# Patient Record
Sex: Male | Born: 1960 | Race: White | Hispanic: No | Marital: Single | Smoking: Current every day smoker
Health system: Southern US, Community
[De-identification: ages and names within clinical notes are randomized; demographics above are authoritative.]

## PROBLEM LIST (undated history)

## (undated) DIAGNOSIS — R079 Chest pain, unspecified: Secondary | ICD-10-CM

## (undated) DIAGNOSIS — J449 Chronic obstructive pulmonary disease, unspecified: Secondary | ICD-10-CM

## (undated) DIAGNOSIS — K759 Inflammatory liver disease, unspecified: Secondary | ICD-10-CM

## (undated) DIAGNOSIS — E440 Moderate protein-calorie malnutrition: Secondary | ICD-10-CM

## (undated) DIAGNOSIS — J439 Emphysema, unspecified: Secondary | ICD-10-CM

## (undated) DIAGNOSIS — Z23 Encounter for immunization: Secondary | ICD-10-CM

## (undated) DIAGNOSIS — J869 Pyothorax without fistula: Secondary | ICD-10-CM

## (undated) DIAGNOSIS — F1020 Alcohol dependence, uncomplicated: Secondary | ICD-10-CM

## (undated) DIAGNOSIS — J189 Pneumonia, unspecified organism: Secondary | ICD-10-CM

## (undated) DIAGNOSIS — R918 Other nonspecific abnormal finding of lung field: Secondary | ICD-10-CM

## (undated) DIAGNOSIS — I10 Essential (primary) hypertension: Secondary | ICD-10-CM

## (undated) DIAGNOSIS — Z720 Tobacco use: Secondary | ICD-10-CM

## (undated) DIAGNOSIS — N3941 Urge incontinence: Secondary | ICD-10-CM

## (undated) DIAGNOSIS — F1010 Alcohol abuse, uncomplicated: Secondary | ICD-10-CM

## (undated) HISTORY — DX: Alcohol abuse, uncomplicated: F10.10

## (undated) HISTORY — DX: Pyothorax without fistula: J86.9

## (undated) HISTORY — DX: Other nonspecific abnormal finding of lung field: R91.8

## (undated) HISTORY — PX: OTHER SURGICAL HISTORY: SHX169

## (undated) HISTORY — DX: Inflammatory liver disease, unspecified: K75.9

## (undated) HISTORY — DX: Essential (primary) hypertension: I10

## (undated) HISTORY — DX: Emphysema, unspecified: J43.9

## (undated) HISTORY — DX: Urge incontinence: N39.41

## (undated) HISTORY — DX: Pneumonia, unspecified organism: J18.9

## (undated) HISTORY — DX: Chest pain, unspecified: R07.9

## (undated) HISTORY — DX: Moderate protein-calorie malnutrition: E44.0

## (undated) HISTORY — DX: Encounter for immunization: Z23

---

## 1997-09-04 ENCOUNTER — Encounter: Payer: Self-pay | Admitting: Emergency Medicine

## 1997-09-04 ENCOUNTER — Emergency Department (HOSPITAL_COMMUNITY): Admission: EM | Admit: 1997-09-04 | Discharge: 1997-09-04 | Payer: Self-pay | Admitting: Emergency Medicine

## 1997-09-17 ENCOUNTER — Emergency Department (HOSPITAL_COMMUNITY): Admission: EM | Admit: 1997-09-17 | Discharge: 1997-09-17 | Payer: Self-pay | Admitting: Emergency Medicine

## 2006-05-29 ENCOUNTER — Emergency Department (HOSPITAL_COMMUNITY): Admission: EM | Admit: 2006-05-29 | Discharge: 2006-05-29 | Payer: Self-pay | Admitting: Emergency Medicine

## 2010-08-24 ENCOUNTER — Emergency Department (HOSPITAL_COMMUNITY): Payer: Self-pay

## 2010-08-24 ENCOUNTER — Emergency Department (HOSPITAL_COMMUNITY)
Admission: EM | Admit: 2010-08-24 | Discharge: 2010-08-24 | Disposition: A | Discharge status: Home / Self Care | Payer: Self-pay | Attending: Emergency Medicine | Admitting: Emergency Medicine

## 2010-08-24 DIAGNOSIS — S4350XA Sprain of unspecified acromioclavicular joint, initial encounter: Secondary | ICD-10-CM | POA: Insufficient documentation

## 2010-08-24 DIAGNOSIS — IMO0002 Reserved for concepts with insufficient information to code with codable children: Secondary | ICD-10-CM | POA: Insufficient documentation

## 2010-08-24 DIAGNOSIS — S0990XA Unspecified injury of head, initial encounter: Secondary | ICD-10-CM | POA: Insufficient documentation

## 2010-08-24 DIAGNOSIS — S40019A Contusion of unspecified shoulder, initial encounter: Secondary | ICD-10-CM | POA: Insufficient documentation

## 2010-08-24 DIAGNOSIS — M25519 Pain in unspecified shoulder: Secondary | ICD-10-CM | POA: Insufficient documentation

## 2013-08-04 ENCOUNTER — Emergency Department (HOSPITAL_COMMUNITY): Payer: Self-pay

## 2013-08-04 ENCOUNTER — Encounter (HOSPITAL_COMMUNITY): Payer: Self-pay | Admitting: Emergency Medicine

## 2013-08-04 ENCOUNTER — Emergency Department (HOSPITAL_COMMUNITY)
Admission: EM | Admit: 2013-08-04 | Discharge: 2013-08-05 | Disposition: A | Discharge status: Home / Self Care | Payer: Self-pay | Attending: Emergency Medicine | Admitting: Emergency Medicine

## 2013-08-04 DIAGNOSIS — D696 Thrombocytopenia, unspecified: Secondary | ICD-10-CM | POA: Insufficient documentation

## 2013-08-04 DIAGNOSIS — S301XXA Contusion of abdominal wall, initial encounter: Secondary | ICD-10-CM | POA: Insufficient documentation

## 2013-08-04 DIAGNOSIS — R748 Abnormal levels of other serum enzymes: Secondary | ICD-10-CM | POA: Insufficient documentation

## 2013-08-04 DIAGNOSIS — Z791 Long term (current) use of non-steroidal anti-inflammatories (NSAID): Secondary | ICD-10-CM | POA: Insufficient documentation

## 2013-08-04 DIAGNOSIS — IMO0001 Reserved for inherently not codable concepts without codable children: Secondary | ICD-10-CM

## 2013-08-04 DIAGNOSIS — T148XXA Other injury of unspecified body region, initial encounter: Secondary | ICD-10-CM

## 2013-08-04 DIAGNOSIS — Y9389 Activity, other specified: Secondary | ICD-10-CM | POA: Insufficient documentation

## 2013-08-04 DIAGNOSIS — S298XXA Other specified injuries of thorax, initial encounter: Secondary | ICD-10-CM | POA: Insufficient documentation

## 2013-08-04 DIAGNOSIS — F10229 Alcohol dependence with intoxication, unspecified: Secondary | ICD-10-CM | POA: Insufficient documentation

## 2013-08-04 DIAGNOSIS — F1020 Alcohol dependence, uncomplicated: Secondary | ICD-10-CM

## 2013-08-04 DIAGNOSIS — F17200 Nicotine dependence, unspecified, uncomplicated: Secondary | ICD-10-CM | POA: Insufficient documentation

## 2013-08-04 DIAGNOSIS — S7000XA Contusion of unspecified hip, initial encounter: Secondary | ICD-10-CM | POA: Insufficient documentation

## 2013-08-04 DIAGNOSIS — Z7982 Long term (current) use of aspirin: Secondary | ICD-10-CM | POA: Insufficient documentation

## 2013-08-04 DIAGNOSIS — M25552 Pain in left hip: Secondary | ICD-10-CM

## 2013-08-04 DIAGNOSIS — Y92410 Unspecified street and highway as the place of occurrence of the external cause: Secondary | ICD-10-CM | POA: Insufficient documentation

## 2013-08-04 DIAGNOSIS — R0789 Other chest pain: Secondary | ICD-10-CM

## 2013-08-04 LAB — I-STAT CHEM 8, ED
BUN: <3 mg/dL — ABNORMAL LOW (ref 6–23)
CREATININE: 0.8 mg/dL (ref 0.50–1.35)
Calcium, Ion: 1.04 mmol/L — ABNORMAL LOW (ref 1.12–1.23)
Chloride: 98 mEq/L (ref 96–112)
Glucose, Bld: 102 mg/dL — ABNORMAL HIGH (ref 70–99)
HCT: 44 % (ref 39.0–52.0)
Hemoglobin: 15 g/dL (ref 13.0–17.0)
POTASSIUM: 4.3 meq/L (ref 3.7–5.3)
SODIUM: 136 meq/L — AB (ref 137–147)
TCO2: 24 mmol/L (ref 0–100)

## 2013-08-04 LAB — CBC
HEMATOCRIT: 38.3 % — AB (ref 39.0–52.0)
Hemoglobin: 13.3 g/dL (ref 13.0–17.0)
MCH: 35.4 pg — ABNORMAL HIGH (ref 26.0–34.0)
MCHC: 34.7 g/dL (ref 30.0–36.0)
MCV: 101.9 fL — AB (ref 78.0–100.0)
PLATELETS: 112 10*3/uL — AB (ref 150–400)
RBC: 3.76 MIL/uL — ABNORMAL LOW (ref 4.22–5.81)
RDW: 13.3 % (ref 11.5–15.5)
WBC: 5.4 10*3/uL (ref 4.0–10.5)

## 2013-08-04 LAB — RAPID URINE DRUG SCREEN, HOSP PERFORMED
Amphetamines: NOT DETECTED
Barbiturates: NOT DETECTED
Benzodiazepines: NOT DETECTED
COCAINE: NOT DETECTED
OPIATES: NOT DETECTED
Tetrahydrocannabinol: NOT DETECTED

## 2013-08-04 LAB — HEPATIC FUNCTION PANEL
ALT: 54 U/L — ABNORMAL HIGH (ref 0–53)
AST: 98 U/L — AB (ref 0–37)
Albumin: 3.8 g/dL (ref 3.5–5.2)
Alkaline Phosphatase: 97 U/L (ref 39–117)
Total Bilirubin: 0.3 mg/dL (ref 0.3–1.2)
Total Protein: 7.8 g/dL (ref 6.0–8.3)

## 2013-08-04 LAB — APTT: APTT: 28 s (ref 24–37)

## 2013-08-04 LAB — PROTIME-INR
INR: 0.91 (ref 0.00–1.49)
PROTHROMBIN TIME: 12.3 s (ref 11.6–15.2)

## 2013-08-04 LAB — ETHANOL: Alcohol, Ethyl (B): 195 mg/dL — ABNORMAL HIGH (ref 0–11)

## 2013-08-04 LAB — FIBRINOGEN: FIBRINOGEN: 551 mg/dL — AB (ref 204–475)

## 2013-08-04 LAB — MAGNESIUM: Magnesium: 1.9 mg/dL (ref 1.5–2.5)

## 2013-08-04 MED ORDER — OXYCODONE-ACETAMINOPHEN 5-325 MG PO TABS
1.0000 | ORAL_TABLET | Freq: Four times a day (QID) | ORAL | Status: DC | PRN
Start: 2013-08-04 — End: 2015-05-22

## 2013-08-04 MED ORDER — LORAZEPAM 1 MG PO TABS
1.0000 mg | ORAL_TABLET | Freq: Once | ORAL | Status: AC
  Administered 2013-08-04: 1 mg via ORAL
  Filled 2013-08-04: qty 1
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
## 2013-08-04 NOTE — Discharge Instructions (Signed)
Your labs showed that your liver is not tolerating the alcohol use, and that your platelets have become lower because of this. That will cause bleeding issues, and easy bruising, which is why your hip bruised badly. All the xrays were negative, and the other labs were relatively normal. Your alcohol use is a problem, and will cause more problems if you continue. Use the pain medication Percocet to help with pain, but don't use any tylenol or advil in addition. Do not drink while taking this medication. Drink plenty of fluids, and use ice packs 20 minutes every hour to help with pain and bruising. Use the resource guide below to find a primary doctor, and follow up with them regarding ongoing issues. Return to the emergency department for any changes or worsening symptoms.    Contusion A contusion is a deep bruise. Contusions happen when an injury causes bleeding under the skin. Signs of bruising include pain, puffiness (swelling), and discolored skin. The contusion may turn blue, purple, or yellow. HOME CARE   Put ice on the injured area.  Put ice in a plastic bag.  Place a towel between your skin and the bag.  Leave the ice on for 15-20 minutes, 03-04 times a day.  Only take medicine as told by your doctor.  Rest the injured area.  If possible, raise (elevate) the injured area to lessen puffiness. GET HELP RIGHT AWAY IF:   You have more bruising or puffiness.  You have pain that is getting worse.  Your puffiness or pain is not helped by medicine. MAKE SURE YOU:   Understand these instructions.  Will watch your condition.  Will get help right away if you are not doing well or get worse. Document Released: 06/13/2007 Document Revised: 03/19/2011 Document Reviewed: 10/30/2010 [facility] Patient Information 2015 [place], [place]. This information is not intended to replace advice given to you by your health care provider. Make sure you discuss any questions you have with your health care  provider.  Finding Treatment for Alcohol and Drug Addiction It can be hard to find the right place to get professional treatment. Here are some important things to consider:  There are different types of treatment to choose from.  Some programs are live-in (residential) while others are not (outpatient). Sometimes a combination is offered.  No single type of program is right for everyone.  Most treatment programs involve a combination of education, counseling, and a 12-step, spiritually-based approach.  There are non-spiritually based programs (not 12-step).  Some treatment programs are government sponsored. They are geared for patients without private insurance.  Treatment programs can vary in many respects such as:  Cost and types of insurance accepted.  Types of on-site medical services offered.  Length of stay, setting, and size.  Overall philosophy of treatment. A person may need specialized treatment or have needs not addressed by all programs. For example, adolescents need treatment appropriate for their age. Other people have secondary disorders that must be managed as well. Secondary conditions can include mental illness, such as depression or diabetes. Often, a period of detoxification from alcohol or drugs is needed. This requires medical supervision and not all programs offer this. THINGS TO CONSIDER WHEN SELECTING A TREATMENT PROGRAM   Is the program certified by the appropriate government agency? Even private programs must be certified and employ certified professionals.  Does the program accept your insurance? If not, can a payment plan be set up?  Is the facility clean, organized, and well run? Do  they allow you to speak with graduates who can share their treatment experience with you? Can you tour the facility? Can you meet with staff?  Does the program meet the full range of individual needs?  Does the treatment program address sexual orientation and physical  disabilities? Do they provide age, gender, and culturally appropriate treatment services?  Is treatment available in languages other than English?  Is long-term aftercare support or guidance encouraged and provided?  Is assessment of an individual's treatment plan ongoing to ensure it meets changing needs?  Does the program use strategies to encourage reluctant patients to remain in treatment long enough to increase the likelihood of success?  Does the program offer counseling (individual or group) and other behavioral therapies?  Does the program offer medicine as part of the treatment regimen, if needed?  Is there ongoing monitoring of possible relapse? Is there a defined relapse prevention program? Are services or referrals offered to family members to ensure they understand addiction and the recovery process? This would help them support the recovering individual.  Are 12-step meetings held at the center or is transport available for patients to attend outside meetings? In countries outside of the [place], Magazine features editorsee local directories for contact information for services in your area. Document Released: 11/23/2004 Document Revised: 03/19/2011 Document Reviewed: 06/05/2007 [facility] Patient Information 2015 [place], [place]. This information is not intended to replace advice given to you by your health care provider. Make sure you discuss any questions you have with your health care provider.  Alcohol Intoxication Alcohol intoxication occurs when you drink enough alcohol that it affects your ability to function. It can be mild or very severe. Drinking a lot of alcohol in a short time is called binge drinking. This can be very harmful. Drinking alcohol can also be more dangerous if you are taking medicines or other drugs. Some of the effects caused by alcohol may include:  Loss of coordination.  Changes in mood and behavior.  Unclear thinking.  Trouble talking (slurred  speech).  Throwing up (vomiting).  Confusion.  Slowed breathing.  Twitching and shaking (seizures).  Loss of consciousness. HOME CARE  Do not drive after drinking alcohol.  Drink enough water and fluids to keep your pee (urine) clear or pale yellow. Avoid caffeine.  Only take medicine as told by your doctor. GET HELP IF:  You throw up (vomit) many times.  You do not feel better after a few days.  You frequently have alcohol intoxication. Your doctor can help decide if you should see a substance use treatment counselor. GET HELP RIGHT AWAY IF:  You become shaky when you stop drinking.  You have twitching and shaking.  You throw up blood. It may look bright red or like coffee grounds.  You notice blood in your poop (bowel movements).  You become lightheaded or pass out (faint). MAKE SURE YOU:   Understand these instructions.  Will watch your condition.  Will get help right away if you are not doing well or get worse. Document Released: 06/13/2007 Document Revised: 08/27/2012 Document Reviewed: 05/30/2012 [facility] Patient Information 2015 [place]. This information is not intended to replace advice given to you by your health care provider. Make sure you discuss any questions you have with your health care provider.  Cryotherapy Cryotherapy is when you put ice on your injury. Ice helps lessen pain and puffiness (swelling) after an injury. Ice works the best when you start using it in the first 24 to 48 hours  after an injury. HOME CARE  Put a dry or damp towel between the ice pack and your skin.  You may press gently on the ice pack.  Leave the ice on for no more than 10 to 20 minutes at a time.  Check your skin after 5 minutes to make sure your skin is okay.  Rest at least 20 minutes between ice pack uses.  Stop using ice when your skin loses feeling (numbness).  Do not use ice on someone who cannot tell you when it hurts. This includes small children  and people with memory problems (dementia). GET HELP RIGHT AWAY IF:  You have white spots on your skin.  Your skin turns blue or pale.  Your skin feels waxy or hard.  Your puffiness gets worse. MAKE SURE YOU:   Understand these instructions.  Will watch your condition.  Will get help right away if you are not doing well or get worse. Document Released: 06/13/2007 Document Revised: 03/19/2011 Document Reviewed: 08/17/2010 [facility] Patient Information 2015 [place], [place]. This information is not intended to replace advice given to you by your health care provider. Make sure you discuss any questions you have with your health care provider.  Thrombocytopenia Thrombocytopenia means there are not enough platelets in your blood. Platelets are tiny cells in your blood. When you start bleeding, platelets clump together around the cut or injury to stop the bleeding. This process is called blood clotting. Not having enough platelets can cause bleeding problems. HOME CARE  Check your skin and inside your mouth for bruises or blood as told by your doctor.  Check your spit (sputum), pee (urine), and poop (stool) for blood as told by your doctor.  Do not do activities that can cause bumps or bruises until your doctor says it is okay.  Be careful not to cut yourself when you shave or use scissors, needles, knives, or other tools.  Be careful not to burn yourself when you iron or cook.  Ask your doctor if you can drink alcohol.  Only take medicines as told by your doctor.  Tell all your doctors and your dentist that you have this bleeding problem. GET HELP RIGHT AWAY IF:  You are bleeding anywhere on your body.  You are bleeding or have bruises without knowing why.  You have blood in your spit, pee, or poop. MAKE SURE YOU:  Understand these instructions.  Will watch your condition.  Will get help right away if you are not doing well or get worse. Document Released: 12/14/2010  Document Revised: 03/19/2011 Document Reviewed: 12/14/2010 [facility] Patient Information 2015 [place], [place]. This information is not intended to replace advice given to you by your health care provider. Make sure you discuss any questions you have with your health care provider.  Emergency Department Resource Guide 1) Find a Doctor and Pay Out of Pocket Although you won't have to find out who is covered by your insurance plan, it is a good idea to ask around and get recommendations. You will then need to call the office and see if the doctor you have chosen will accept you as a new patient and what types of options they offer for patients who are self-pay. Some doctors offer discounts or will set up payment plans for their patients who do not have insurance, but you will need to ask so you aren't surprised when you get to your appointment.  2) Contact Your Local Health Department Not all health departments have doctors that can  see patients for sick visits, but many do, so it is worth a call to see if yours does. If you don't know where your local health department is, you can check in your phone book. The CDC also has a tool to help you locate your state's health department, and many state websites also have listings of all of their local health departments.  3) Find a Walk-in Clinic If your illness is not likely to be very severe or complicated, you may want to try a walk in clinic. These are popping up all over the country in pharmacies, drugstores, and shopping centers. They're usually staffed by nurse practitioners or physician assistants that have been trained to treat common illnesses and complaints. They're usually fairly quick and inexpensive. However, if you have serious medical issues or chronic medical problems, these are probably not your best option.  No Primary Care Doctor: - Call Health Connect at  [phone] - they can help you locate a primary care doctor that  accepts your insurance,  provides certain services, etc. - Physician Referral Service- [phone]  Chronic Pain Problems: Organization         Address  Phone   Notes  [facility]  [phone] Patients need to be referred by their primary care doctor.   Medication Assistance: Organization         Address  Phone   Notes  [facility] Medication [facility] [address] [place] [place], [place] [address] [phone] --Must be a resident of [place] -- Must have NO insurance coverage whatsoever (no Medicaid/ Medicare, etc.) -- The pt. MUST have a primary care doctor that directs their care regularly and follows them in the community   MedAssist  [phone]   [employer]  [phone]    Agencies that provide inexpensive medical care: Organization         Address  Phone   Notes  [facility]  [phone]   [facility]    [phone]   [facility] [address] [place], [place] [address] [phone]   [facility] [address] [place] [place] [phone]   Planned Parenthood    [phone]   [facility]    [phone]   [facility]  [address], [place] Phone:  [phone], Fax:  [phone] Hours of Operation:  9 am - 6 pm, M-F.  Also accepts Medicaid/Medicare and self-pay.  [facility]  [address], [place] Phone: [phone], Fax: [phone]. Hours of Operation:  8:30 am - 5:30 pm, M-F.  Also accepts Medicaid and self-pay.  [facility] [place] [address] [place] Phone: [phone]   [facility] [address] Natasha Bence [place], [place] [phone], Ext. [phone] Mondays & Thursdays: 7-9 AM.  First 15 patients are seen on a first come, first serve basis.    Medicaid-accepting [facility] Providers:  Organization         Address  Phone    Notes  [facility] [address], [place] [phone] Also accepts self-pay patients.  [facility] [address] Laurell Josephs [place], [place]  [phone]   [facility] [address], [place] [phone]   [facility] [address] [place] [address],  [place] [phone]   Renaye Rakers [address], [place]   [phone] Only accepts [place] Access [place] patients after they have their name applied to their card.   Self-Pay (no insurance) in [facility]:  Organization         Address  Phone   Notes  Sickle Cell Patients, [facility] [address] [place], [place] [phone]   [facility] Urgent Care [address]  [place], [place] [phone]   [facility] [place]  [address], [place] [phone]   [facility]/Dr. Osei-Bonsu  [address] [place] or [address], [place] [phone] Phone number for both [place] and [place] locations is the same.  [facility] [address] [place] [phone]   [facility] [address], [place] or [address] [phone]   [facility] [address] [place] [phone], phone; [phone], fax Sees patients 1st and 3rd Saturday of every month.  Must not qualify for public or private insurance (i.e. Medicaid, Medicare, [place] Health Choice, Veterans' Benefits)  Household income should be no more than 200% of the poverty level The clinic cannot treat you if you are pregnant or think you are pregnant  Sexually transmitted diseases are not treated at the clinic.    Dental Care: Organization         Address  Phone  Notes  [facility] Department of [facility] [address] [place], [place] [phone] Accepts children up to age 35 who are enrolled in [place] or [place] Health Choice; pregnant women with a Medicaid card; and children who have applied for Medicaid or [place] Health Choice, but were declined, whose parents can pay a reduced fee at time of service.  [facility] Department of [facility]  [address], [place] [phone] Accepts children up to age 28 who are enrolled in [place] or [place] Health Choice; pregnant women with a Medicaid card; and children who have applied for Medicaid or [place] Health Choice, but were declined, whose parents can pay a reduced fee at time of service.  [facility]  [address]  [place], [place] [phone] Patients are seen by appointment only. Walk-ins are not accepted. [facility] will see patients 26 years of age and older. Monday - Tuesday (8am-5pm) Most Wednesdays (8:30-5pm) $30 per visit, cash only  [facility] Adult Dental Access PRO[facility]  [address], [facility] [phone] Patients are seen by appointment only. Walk-ins are not accepted. [facility] will see patients 24 years of age and older. One Wednesday Evening (Monthly: Volunteer Based).  $30 per visit, cash only  [employer]  [phone] for adults; Children under age 40, call Graduate Pediatric Dentistry at [phone]. Children aged 20-14, please call [phone] to request a pediatric application.  Dental services are provided in all areas of dental care including fillings, crowns and bridges, complete and partial dentures, implants, gum treatment, root canals, and extractions. Preventive care is also provided. Treatment is provided to both adults and children. Patients are selected via a lottery and there is often a waiting list.   [facility] [address] , [place]  [phone] [email or web address] farm manager Dental [address], [place]  [place], [place] [phone], Ext.  123 Second and Fourth Thursday of each month, opens at 6:30 AM; Clinic ends at 9 AM.  Patients are seen on a first-come first-served basis, and a limited number are seen during each clinic.   [facility]  [address] Griff[address] [place], [place] [phone]   Eligibility Requirements You must have lived in [place], [place], or [place] counties for at least the last three months.   You cannot be eligible for state or federal sponsored [place], including [employer], [place], or [employer].   You generally cannot be eligible for healthcare insurance through your employer.    How to apply: Eligibility screenings are held every Tuesday and Wednesday afternoon from 1:00 pm until 4:00 pm. You do not need an appointment for the interview!  [facility] [address] [place], [place] [phone]   [facility] Health Department  [phone]   [facility] Health Department  [phone]   [facility] Health Department  [phone]    Behavioral Health Resources in the Community: Intensive Outpatient Programs Organization         Address  Phone  Notes  [facility] [address] , [place], [place] [phone]   [facility] [address], [place], [place] [phone]   [facility] [address] , [place], [place]  [phone]   [facility] Mental Health [address],  [place], [place] [phone] or [phone]   Substance Abuse Resources Organization         Address  Phone  Notes  [facility]  [phone]   [facility]  [phone]   [facility] [place]  [phone]   Floydene Flock  [phone]   Residential & Outpatient Substance Abuse Program  [phone]   Psychological Services Organization         Address  Phone  Notes  [facility]al Health  [phone]   [facility]  [phone]   [facility]  Mental Health [address] [place] [phone] or [phone]    Mobile Crisis Teams Organization         Address  Phone  Notes  [facility], Mobile Crisis Care Unit  [phone]   [facility]  [address] [place], [place] [phone]   Doristine Locks [address] [place] [phone]    Self-Help/Support Groups Organization         Address  Phone             Notes  [facility] [place] - variety of support groups  336- I7437963 Call for more information  Narcotics Anonymous (NA), [facility] [address], [employer] [place]  2 meetings at this location   Statistician         Address  Phone  Notes  [facility] [address],    [place]  [phone]   [facility]  [address] [place] [address], [place], [place] [phone]   [facility] [address]  [place], [place] [phone] Admissions: 8am-3pm M-F  [facility] [address]    [place], [place] [phone]   [facility] [address] Starling Manns [place], [place] [phone]   [facility] [address]  [place], [place] [phone]   [facility] - Intensive Outpatient [address] Laurell Joseph[address], [place], [place] [phone]  [facility] (Addiction [facility] [address] [place]  [place], [place] [phone] or [phone]   [facility] ([facility]) [address], [place], [place] [phone] Accepts Medicaid  [facility] [place] [address]  [place] [phone] Substance Abuse/Addiction Treatment   [facility] Organization         Address  Phone  Notes  [facility]  [phone]   Angie Fava, PhD [address] Ervin Knack [place], [place]   [phone] or [phone]   [facility]   [address] [place], [place] [phone]   [facility] [address] , [place], [place] [phone] Insurance/Medicaid/sponsorship through [facility] [address]                                    [place], [place] [phone] Therapy/tele-psych/case  [facility] [address], [place] [phone]    Dr. Lolly Mustache  [phone]   [facility] [place]  [employer] [facility] Dept. 1) [address] [place] 2) [address], [place] 3)  [address], [place] [phone]  [phone]   [facility] Child Abuse Hotline 2162418648 or [phone] (After Hours)
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
## 2013-08-05 NOTE — ED Provider Notes (Signed)
Medical screening examination/treatment/procedure(s) were performed by non-physician practitioner and as supervising physician I was immediately available for consultation/collaboration.   EKG Interpretation None      Devoria AlbeIva Knapp, MD, Armando GangFACEP   Wideman GivensIva L Knapp, MD 08/05/13 [phone]

## 2014-10-29 ENCOUNTER — Emergency Department (HOSPITAL_COMMUNITY): Payer: Self-pay

## 2014-10-29 ENCOUNTER — Emergency Department (HOSPITAL_COMMUNITY)
Admission: EM | Admit: 2014-10-29 | Discharge: 2014-10-29 | Disposition: A | Discharge status: Home / Self Care | Payer: Self-pay | Attending: Emergency Medicine | Admitting: Emergency Medicine

## 2014-10-29 ENCOUNTER — Encounter (HOSPITAL_COMMUNITY): Payer: Self-pay | Admitting: Emergency Medicine

## 2014-10-29 DIAGNOSIS — Y998 Other external cause status: Secondary | ICD-10-CM | POA: Insufficient documentation

## 2014-10-29 DIAGNOSIS — F1010 Alcohol abuse, uncomplicated: Secondary | ICD-10-CM | POA: Insufficient documentation

## 2014-10-29 DIAGNOSIS — Z720 Tobacco use: Secondary | ICD-10-CM | POA: Insufficient documentation

## 2014-10-29 DIAGNOSIS — S32009A Unspecified fracture of unspecified lumbar vertebra, initial encounter for closed fracture: Secondary | ICD-10-CM | POA: Insufficient documentation

## 2014-10-29 DIAGNOSIS — Y9289 Other specified places as the place of occurrence of the external cause: Secondary | ICD-10-CM | POA: Insufficient documentation

## 2014-10-29 DIAGNOSIS — R1031 Right lower quadrant pain: Secondary | ICD-10-CM | POA: Insufficient documentation

## 2014-10-29 DIAGNOSIS — X58XXXA Exposure to other specified factors, initial encounter: Secondary | ICD-10-CM | POA: Insufficient documentation

## 2014-10-29 DIAGNOSIS — Z791 Long term (current) use of non-steroidal anti-inflammatories (NSAID): Secondary | ICD-10-CM | POA: Insufficient documentation

## 2014-10-29 DIAGNOSIS — Y9389 Activity, other specified: Secondary | ICD-10-CM | POA: Insufficient documentation

## 2014-10-29 LAB — CBC
HCT: 40.3 % (ref 39.0–52.0)
Hemoglobin: 13.9 g/dL (ref 13.0–17.0)
MCH: 35.5 pg — ABNORMAL HIGH (ref 26.0–34.0)
MCHC: 34.5 g/dL (ref 30.0–36.0)
MCV: 103.1 fL — AB (ref 78.0–100.0)
PLATELETS: 234 10*3/uL (ref 150–400)
RBC: 3.91 MIL/uL — ABNORMAL LOW (ref 4.22–5.81)
RDW: 12.8 % (ref 11.5–15.5)
WBC: 6.1 10*3/uL (ref 4.0–10.5)

## 2014-10-29 LAB — COMPREHENSIVE METABOLIC PANEL
ALK PHOS: 84 U/L (ref 38–126)
ALT: 48 U/L (ref 17–63)
ANION GAP: 12 (ref 5–15)
AST: 107 U/L — ABNORMAL HIGH (ref 15–41)
Albumin: 4.4 g/dL (ref 3.5–5.0)
BUN: <5 mg/dL — ABNORMAL LOW (ref 6–20)
CALCIUM: 9 mg/dL (ref 8.9–10.3)
CO2: 26 mmol/L (ref 22–32)
Chloride: 95 mmol/L — ABNORMAL LOW (ref 101–111)
Creatinine, Ser: 0.6 mg/dL — ABNORMAL LOW (ref 0.61–1.24)
GFR calc Af Amer: >60 mL/min (ref >60)
GFR calc non Af Amer: >60 mL/min (ref >60)
Glucose, Bld: 121 mg/dL — ABNORMAL HIGH (ref 65–99)
Potassium: 4 mmol/L (ref 3.5–5.1)
SODIUM: 133 mmol/L — AB (ref 135–145)
TOTAL PROTEIN: 8.6 g/dL — AB (ref 6.5–8.1)
Total Bilirubin: 0.5 mg/dL (ref 0.3–1.2)

## 2014-10-29 LAB — URINALYSIS, ROUTINE W REFLEX MICROSCOPIC
Bilirubin Urine: NEGATIVE
Glucose, UA: NEGATIVE mg/dL
Hgb urine dipstick: NEGATIVE
KETONES UR: NEGATIVE mg/dL
LEUKOCYTES UA: NEGATIVE
NITRITE: NEGATIVE
PH: 6.5 (ref 5.0–8.0)
PROTEIN: 30 mg/dL — AB
Specific Gravity, Urine: 1.007 (ref 1.005–1.030)
Urobilinogen, UA: 1 mg/dL (ref 0.0–1.0)

## 2014-10-29 LAB — URINE MICROSCOPIC-ADD ON

## 2014-10-29 LAB — LIPASE, BLOOD: Lipase: 53 U/L — ABNORMAL HIGH (ref 11–51)

## 2014-10-29 MED ORDER — IOHEXOL 300 MG/ML  SOLN
100.0000 mL | Freq: Once | INTRAMUSCULAR | Status: AC | PRN
  Administered 2014-10-29: 100 mL via INTRAVENOUS

## 2014-10-29 MED ORDER — SODIUM CHLORIDE 0.9 % IV BOLUS (SEPSIS)
1000.0000 mL | Freq: Once | INTRAVENOUS | Status: AC
  Administered 2014-10-29: 1000 mL via INTRAVENOUS

## 2014-10-29 MED ORDER — IBUPROFEN 800 MG PO TABS
800.0000 mg | ORAL_TABLET | Freq: Once | ORAL | Status: AC
Start: 2014-10-29 — End: 2014-10-29
  Administered 2014-10-29: 800 mg via ORAL
  Filled 2014-10-29: qty 1

## 2014-10-29 MED ORDER — IOHEXOL 300 MG/ML  SOLN
25.0000 mL | Freq: Once | INTRAMUSCULAR | Status: AC | PRN
  Administered 2014-10-29: 25 mL via ORAL

## 2014-10-29 NOTE — ED Notes (Signed)
Pt drinking po contrast for CT.

## 2014-10-29 NOTE — ED Notes (Signed)
Patient transported to CT 

## 2014-10-29 NOTE — ED Notes (Signed)
Pt aware that a urine sample is need, but is unable to urinate at this time.
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
## 2014-10-29 NOTE — ED Notes (Signed)
Patient able to ambulate independently  

## 2014-10-29 NOTE — Discharge Instructions (Signed)
Alcohol Use Disorder °Alcohol use disorder is a mental disorder. It is not a one-time incident of heavy drinking. Alcohol use disorder is the excessive and uncontrollable use of alcohol over time that leads to problems with functioning in one or more areas of daily living. People with this disorder risk harming themselves and others when they drink to excess. Alcohol use disorder also can cause other mental disorders, such as mood and anxiety disorders, and serious physical problems. People with alcohol use disorder often misuse other drugs.  °Alcohol use disorder is common and widespread. Some people with this disorder drink alcohol to cope with or escape from negative life events. Others drink to relieve chronic pain or symptoms of mental illness. People with a family history of alcohol use disorder are at higher risk of losing control and using alcohol to excess.  °Drinking too much alcohol can cause injury, accidents, and health problems. One drink can be too much when you are: °· Working. °· Pregnant or breastfeeding. °· Taking medicines. Ask your doctor. °· Driving or planning to drive. °SYMPTOMS  °Signs and symptoms of alcohol use disorder may include the following:  °· Consumption of alcohol in larger amounts or over a longer period of time than intended. °· Multiple unsuccessful attempts to cut down or control alcohol use.   °· A great deal of time spent obtaining alcohol, using alcohol, or recovering from the effects of alcohol (hangover). °· A strong desire or urge to use alcohol (cravings).   °· Continued use of alcohol despite problems at work, school, or home because of alcohol use.   °· Continued use of alcohol despite problems in relationships because of alcohol use. °· Continued use of alcohol in situations when it is physically hazardous, such as driving a car. °· Continued use of alcohol despite awareness of a physical or psychological problem that is likely related to alcohol use. Physical  problems related to alcohol use can involve the brain, heart, liver, stomach, and intestines. Psychological problems related to alcohol use include intoxication, depression, anxiety, psychosis, delirium, and dementia.   °· The need for increased amounts of alcohol to achieve the same desired effect, or a decreased effect from the consumption of the same amount of alcohol (tolerance). °· Withdrawal symptoms upon reducing or stopping alcohol use, or alcohol use to reduce or avoid withdrawal symptoms. Withdrawal symptoms include: °· Racing heart. °· Hand tremor. °· Difficulty sleeping. °· Nausea. °· Vomiting. °· Hallucinations. °· Restlessness. °· Seizures. °DIAGNOSIS °Alcohol use disorder is diagnosed through an assessment by your health care provider. Your health care provider may start by asking three or four questions to screen for excessive or problematic alcohol use. To confirm a diagnosis of alcohol use disorder, at least two symptoms must be present within a 12-month period. The severity of alcohol use disorder depends on the number of symptoms: °· Mild--two or three. °· Moderate--four or five. °· Severe--six or more. °Your health care provider may perform a physical exam or use results from lab tests to see if you have physical problems resulting from alcohol use. Your health care provider may refer you to a mental health professional for evaluation. °TREATMENT  °Some people with alcohol use disorder are able to reduce their alcohol use to low-risk levels. Some people with alcohol use disorder need to quit drinking alcohol. When necessary, mental health professionals with specialized training in substance use treatment can help. Your health care provider can help you decide how severe your alcohol use disorder is and what type of treatment you need.   The following forms of treatment are available:   Detoxification. Detoxification involves the use of prescription medicines to prevent alcohol withdrawal  symptoms in the first week after quitting. This is important for people with a history of symptoms of withdrawal and for heavy drinkers who are likely to have withdrawal symptoms. Alcohol withdrawal can be dangerous and, in severe cases, cause death. Detoxification is usually provided in a hospital or in-patient substance use treatment facility.  Counseling or talk therapy. Talk therapy is provided by substance use treatment counselors. It addresses the reasons people use alcohol and ways to keep them from drinking again. The goals of talk therapy are to help people with alcohol use disorder find healthy activities and ways to cope with life stress, to identify and avoid triggers for alcohol use, and to handle cravings, which can cause relapse.  Medicines.Different medicines can help treat alcohol use disorder through the following actions:  Decrease alcohol cravings.  Decrease the positive reward response felt from alcohol use.  Produce an uncomfortable physical reaction when alcohol is used (aversion therapy).  Support groups. Support groups are run by people who have quit drinking. They provide emotional support, advice, and guidance. These forms of treatment are often combined. Some people with alcohol use disorder benefit from intensive combination treatment provided by specialized substance use treatment centers. Both inpatient and outpatient treatment programs are available.   This information is not intended to replace advice given to you by your health care provider. Make sure you discuss any questions you have with your health care provider.   Document Released: 02/02/2004 Document Revised: 01/15/2014 Document Reviewed: 04/03/2012 Elsevier Interactive Patient Education 2016 [employer]  Lumbar Fracture A lumbar fracture is a break in one of the bones of the lower back. Lumbar fractures range in severity. Severe fractures can damage the spinal cord. CAUSES This condition may be  caused by:  A fall (common).  A car accident (common).  A gunshot wound.  A hard, direct hit to the back.  Osteoporosis. SYMPTOMS The main symptom of this condition is severe pain in the lower back. If a fracture is complex or severe, there may also be:  A misshapen or swollen area on the lower back.  A limited ability to move an area of the lower back.  An inability to empty the bladder or bowel.  A loss of strength or sensation in the legs, feet, and toes.  Paralysis. DIAGNOSIS This condition is diagnosed based on:  A physical exam.  Symptoms and what happened just before they developed.  The results of imaging tests, such as an X-ray, CT scan, or MRI. If your nerves have been damaged, you may also have other tests to find out how much damage there is. TREATMENT Treatment for this condition depends on the specifics of the injury. Most fractures can be treated with:  A back brace.  Bed rest and activity restrictions.  Pain medicine.  Physical therapy. Fractures that are complex, involve multiple bones, or make the spine unstable may require surgery to remove pressure from the nerves or spinal cord and to stabilize the broken pieces of bone. During recovery, it is normal to have pain and stiffness in the back for weeks. HOME CARE INSTRUCTIONS Medicines  Take medicines only as directed by your health care provider.  Do not drive or operate heavy machinery while taking pain medicine. Activity  Stay in bed for as long as directed by your health care provider.  If you were shown how  to do any exercises to improve motion and strength in your back, do them as directed by your health care provider.  Return to your normal activities as directed by your health care provider. Ask your health care provider what activities are safe for you. General Instructions  If you were given a neck brace or back brace, wear it as directed by your health care provider.  Keep all  follow-up visits as directed by your health care provider. This is important. Failure to follow-up as recommended could result in permanent injury, disability, and long-lasting (chronic) pain. SEEK MEDICAL CARE IF:  Your pain does not improve over time.  You have a persistent cough.  You cannot return to your normal activities as planned or expected. SEEK IMMEDIATE MEDICAL CARE IF:  You have severe pain or your pain suddenly gets worse.  You are unable to move.  You have numbness, tingling, weakness, or paralysis in any part of your body.  You cannot control your bladder or bowel.  You have difficulty breathing.  You have a fever.  You have pain in your chest or abdomen.  You vomit.   This information is not intended to replace advice given to you by your health care provider. Make sure you discuss any questions you have with your health care provider.   Document Released: 04/11/2006 Document Revised: 05/11/2014 Document Reviewed: 12/21/2013 Elsevier Interactive Patient Education 2016 [employer] Inc[employer]  Transverse Process Fracture Each bone of the spine (vertebra) has portions of bone that extend off to either side of the spine. These portions of bone are called transverse processes. A transverse process fracture, which is also called a rotation spine fracture, is a break in a transverse process. CAUSES This condition may be caused by:  A fall from a height.  A car accident.  A sports injury.  A gunshot wound.  A hard, direct hit to the back. This kind of fracture often results from a sudden and severe bending of the spine to one side. RISK FACTORS This condition is more likely to develop in:  People who have thinning and loss of density in the bones (osteoporosis).  People who play a contact sport. SYMPTOMS The main symptom of this condition is back pain. The pain may be felt on the side of the spine (flank) where the fracture is. It may get worse when you move or  take deep a deep breath. DIAGNOSIS This condition may be diagnosed based on symptoms, a medical history, and a physical exam. During the physical exam, your health care provider may tap along the length of your spine to see where you feel pain. Imaging tests may be done to confirm the diagnosis. They may include:  X-rays.  A CT scan.  MRI. TREATMENT Most transverse process fractures heal on their own with time and with rest. Treatment may involve supportive care, such as:  A back brace.  Activity limits.  Pain medicine.  Muscle-relaxing medicine.  Physical therapy. HOME CARE INSTRUCTIONS General Instructions  Take medicines only as directed by your health care provider.  Do not drive or operate heavy machinery while taking pain medicine.  Wear your neck or back brace as directed by your health care provider.  Keep all follow-up visits as directed by your health care provider. This is important. It can help to prevent permanent injury, disability, and long-lasting (chronic) pain. Activity  Stay in bed (on bed rest) only as directed by your health care provider. Being on bed rest for too  long can make your condition worse.  Return to your normal activities when your health care provider says it is okay. Ask if there are any activities that you should not do.  Do your physical therapy as recommended by your health care provider. SEEK MEDICAL CARE IF:  You have a fever.  You develop a cough that makes your pain worse.  Your pain medicine is not helping.  Your pain does not get better over time.  You cannot return to your normal activities as planned or expected. SEEK IMMEDIATE MEDICAL CARE IF:  Your pain is very bad and it suddenly gets worse.  You are unable to move any body part (paralysis) that is below the level of your injury.  You have numbness, tingling, or weakness in any body part that is below the level of your injury.  You cannot control your bladder or  bowels.   This information is not intended to replace advice given to you by your health care provider. Make sure you discuss any questions you have with your health care provider.   Document Released: 04/11/2006 Document Revised: 05/11/2014 Document Reviewed: 12/29/2013 Elsevier Interactive Patient Education 2016 Elsevier Inc.  Abdominal Pain, Adult Many things can cause abdominal pain. Usually, abdominal pain is not caused by a disease and will improve without treatment. It can often be observed and treated at home. Your health care provider will do a physical exam and possibly order blood tests and X-rays to help determine the seriousness of your pain. However, in many cases, more time must pass before a clear cause of the pain can be found. Before that point, your health care provider may not know if you need more testing or further treatment. HOME CARE INSTRUCTIONS Monitor your abdominal pain for any changes. The following actions may help to alleviate any discomfort you are experiencing:  Only take over-the-counter or prescription medicines as directed by your health care provider.  Do not take laxatives unless directed to do so by your health care provider.  Try a clear liquid diet (broth, tea, or water) as directed by your health care provider. Slowly move to a bland diet as tolerated. SEEK MEDICAL CARE IF:  You have unexplained abdominal pain.  You have abdominal pain associated with nausea or diarrhea.  You have pain when you urinate or have a bowel movement.  You experience abdominal pain that wakes you in the night.  You have abdominal pain that is worsened or improved by eating food.  You have abdominal pain that is worsened with eating fatty foods.  You have a fever. SEEK IMMEDIATE MEDICAL CARE IF:  Your pain does not go away within 2 hours.  You keep throwing up (vomiting).  Your pain is felt only in portions of the abdomen, such as the right side or the left  lower portion of the abdomen.  You pass bloody or black tarry stools. MAKE SURE YOU:  Understand these instructions.  Will watch your condition.  Will get help right away if you are not doing well or get worse.   This information is not intended to replace advice given to you by your health care provider. Make sure you discuss any questions you have with your health care provider.   Document Released: 10/04/2004 Document Revised: 09/15/2014 Document Reviewed: 09/03/2012 Elsevier Interactive Patient Education [employer]

## 2014-10-29 NOTE — ED Provider Notes (Signed)
CSN: [id number]     Arrival date & time 10/29/14  4098 History   First MD Initiated Contact with Patient 10/29/14 0326     Chief Complaint  Patient presents with  . Abdominal Pain     (Consider location/radiation/quality/duration/timing/severity/associated sxs/prior Treatment) HPI Comments: 54 y/o male with a hx of ETOH dependence and homelessness presents to the ED for evaluation of right lower quadrant abdominal pain which began this evening. Pain was acute in onset and has been persistent without alleviating factors. Patient denies taking any medications for his symptoms. He states that he has been drinking to try and relieve the pain. He endorses drinking on a daily basis; a few 40 ounce beers per day. Patient denies any illicit drug use. Pain is worse with certain movements. Patient denies any associated fever, chest pain, shortness of breath, nausea, vomiting, diarrhea, hematuria, or dysuria. He denies a history of abdominal surgeries. He denies any recent falls or trauma. No incontinence or extremity numbness/weakness.  Patient is a 54 y.o. male presenting with abdominal pain. The history is provided by the patient. No language interpreter was used.  Abdominal Pain Associated symptoms: no chest pain, no dysuria, no fever, no hematuria, no nausea, no shortness of breath and no vomiting     History reviewed. No pertinent past medical history. Past Surgical History  Procedure Laterality Date  . Arm surgery     History reviewed. No pertinent family history. Social History  Substance Use Topics  . Smoking status: Current Every Day Smoker  . Smokeless tobacco: None  . Alcohol Use: Yes     Comment: daily    Review of Systems  Constitutional: Negative for fever.  Respiratory: Negative for shortness of breath.   Cardiovascular: Negative for chest pain.  Gastrointestinal: Positive for abdominal pain. Negative for nausea, vomiting and abdominal distention.  Genitourinary: Negative  for dysuria and hematuria.  All other systems reviewed and are negative.   Allergies  Review of patient's allergies indicates no known allergies.  Home Medications   Prior to Admission medications   Medication Sig Start Date End Date Taking? Authorizing Provider  acetaminophen (TYLENOL) 325 MG tablet Take 325 mg by mouth every 6 (six) hours as needed for mild pain.   Yes Historical Provider, MD  naproxen sodium (ANAPROX) 220 MG tablet Take 220 mg by mouth 2 (two) times daily with a meal.   Yes Historical Provider, MD  oxyCODONE-acetaminophen (PERCOCET) 5-325 MG per tablet Take 1-2 tablets by mouth every 6 (six) hours as needed for severe pain. Patient not taking: Reported on 10/29/2014 08/04/13   Mercedes Camprubi-Soms, PA-C   BP 133/92 mmHg  Pulse 70  Temp(Src) 97.4 F (36.3 C) (Oral)  Resp 18  SpO2 96%   Physical Exam  Constitutional: He is oriented to person, place, and time. He appears well-developed and well-nourished. No distress.  Nontoxic/nonseptic appearing; pleasant.  HENT:  Head: Normocephalic and atraumatic.  Eyes: Conjunctivae and EOM are normal. No scleral icterus.  Neck: Normal range of motion.  Cardiovascular: Normal rate, regular rhythm and intact distal pulses.   Pulmonary/Chest: Effort normal. No respiratory distress. He has no wheezes.  Respirations even and unlabored  Abdominal: Soft. He exhibits no distension. There is tenderness. There is no rebound and no guarding.  Soft, nondistended abdomen with focal TTP in the R mid abdomen and the RLQ. No masses or peritoneal signs. Negative Murphy's sign.  Musculoskeletal: Normal range of motion.  Neurological: He is alert and oriented to person, place, and  time. He exhibits normal muscle tone. Coordination normal.  GCS 15. Speech is goal oriented. Patient moving all extremities.  Skin: Skin is warm and dry. No rash noted. He is not diaphoretic. No erythema. No pallor.  Psychiatric: He has a normal mood and affect.  His behavior is normal.  Nursing note and vitals reviewed.   ED Course  Procedures (including critical care time) Labs Review Labs Reviewed  LIPASE, BLOOD - Abnormal; Notable for the following:    Lipase 53 (*)    All other components within normal limits  COMPREHENSIVE METABOLIC PANEL - Abnormal; Notable for the following:    Sodium 133 (*)    Chloride 95 (*)    Glucose, Bld 121 (*)    BUN <5 (*)    Creatinine, Ser 0.60 (*)    Total Protein 8.6 (*)    AST 107 (*)    All other components within normal limits  CBC - Abnormal; Notable for the following:    RBC 3.91 (*)    MCV 103.1 (*)    MCH 35.5 (*)    All other components within normal limits  URINALYSIS, ROUTINE W REFLEX MICROSCOPIC ([facility]) - Abnormal; Notable for the following:    APPearance CLOUDY (*)    Protein, ur 30 (*)    All other components within normal limits  URINE MICROSCOPIC-ADD ON    Imaging Review No results found.   I have personally reviewed and evaluated these images and lab results as part of my medical decision-making.   EKG Interpretation None      MDM   Final diagnoses:  Right lower quadrant abdominal pain    54 year old male presents to the emergency department for further evaluation of right lower quadrant abdominal pain. Pain awoke the patient from sleep this evening. He reports drinking alcohol to try and improve his symptoms. Patient with history of alcohol dependence. No fever, nausea, vomiting, or bowel complaints. Patient denies urinary symptoms. No hx of abdominal surgeries. Laboratory workup is noncontributory, c/w priors. Symptoms likely benign; however, patient is homeless and unreliable for follow up. Will further assess with CT to evaluate appendix. Anticipate discharge if CT imaging is negative. Patient signed out to Celene Skeenobyn Hess, PA-C at change of shift who will reassess and disposition appropriately upon completion of CT imaging.   Filed Vitals:   10/29/14 0249 10/29/14  0555  BP: 157/102 133/92  Pulse: 90 70  Temp: 97.5 F (36.4 C) 97.4 F (36.3 C)  TempSrc: Temporal Oral  Resp: 19 18  SpO2: 97% 96%     Antony MaduraKelly Syncere Eble, PA-C 10/29/14 [id number]  Loren Raceravid Yelverton, MD 10/30/14 [phone]

## 2014-10-29 NOTE — ED Notes (Signed)
Patient is having right lower quadrant pain. Patient woke up in pain. Patient tried to walk it off but the pain did not go away.

## 2014-10-29 NOTE — ED Notes (Signed)
Returned from CT.

## 2015-05-02 ENCOUNTER — Ambulatory Visit: Payer: Self-pay | Attending: Family Medicine

## 2015-05-09 DIAGNOSIS — J869 Pyothorax without fistula: Secondary | ICD-10-CM

## 2015-05-09 DIAGNOSIS — J189 Pneumonia, unspecified organism: Secondary | ICD-10-CM

## 2015-05-09 HISTORY — DX: Pneumonia, unspecified organism: J18.9

## 2015-05-09 HISTORY — DX: Pyothorax without fistula: J86.9

## 2015-05-22 ENCOUNTER — Emergency Department (HOSPITAL_COMMUNITY): Payer: Self-pay

## 2015-05-22 ENCOUNTER — Inpatient Hospital Stay (HOSPITAL_COMMUNITY): Payer: Self-pay

## 2015-05-22 ENCOUNTER — Encounter (HOSPITAL_COMMUNITY): Payer: Self-pay | Admitting: *Deleted

## 2015-05-22 ENCOUNTER — Inpatient Hospital Stay (HOSPITAL_COMMUNITY)
Admission: EM | Admit: 2015-05-22 | Discharge: 2015-05-24 | DRG: 871 | Disposition: A | Discharge status: Home / Self Care | Payer: Self-pay | Attending: Family Medicine | Admitting: Family Medicine

## 2015-05-22 DIAGNOSIS — F10939 Alcohol use, unspecified with withdrawal, unspecified: Secondary | ICD-10-CM | POA: Diagnosis present

## 2015-05-22 DIAGNOSIS — F1029 Alcohol dependence with unspecified alcohol-induced disorder: Secondary | ICD-10-CM

## 2015-05-22 DIAGNOSIS — E872 Acidosis: Secondary | ICD-10-CM | POA: Diagnosis present

## 2015-05-22 DIAGNOSIS — E860 Dehydration: Secondary | ICD-10-CM | POA: Diagnosis present

## 2015-05-22 DIAGNOSIS — R7989 Other specified abnormal findings of blood chemistry: Secondary | ICD-10-CM | POA: Diagnosis present

## 2015-05-22 DIAGNOSIS — F10230 Alcohol dependence with withdrawal, uncomplicated: Secondary | ICD-10-CM

## 2015-05-22 DIAGNOSIS — J189 Pneumonia, unspecified organism: Secondary | ICD-10-CM | POA: Diagnosis present

## 2015-05-22 DIAGNOSIS — Z8249 Family history of ischemic heart disease and other diseases of the circulatory system: Secondary | ICD-10-CM

## 2015-05-22 DIAGNOSIS — R748 Abnormal levels of other serum enzymes: Secondary | ICD-10-CM

## 2015-05-22 DIAGNOSIS — K759 Inflammatory liver disease, unspecified: Secondary | ICD-10-CM | POA: Diagnosis present

## 2015-05-22 DIAGNOSIS — E871 Hypo-osmolality and hyponatremia: Secondary | ICD-10-CM | POA: Diagnosis present

## 2015-05-22 DIAGNOSIS — F10239 Alcohol dependence with withdrawal, unspecified: Secondary | ICD-10-CM | POA: Diagnosis present

## 2015-05-22 DIAGNOSIS — A419 Sepsis, unspecified organism: Principal | ICD-10-CM | POA: Diagnosis present

## 2015-05-22 DIAGNOSIS — Z590 Homelessness: Secondary | ICD-10-CM

## 2015-05-22 DIAGNOSIS — F1020 Alcohol dependence, uncomplicated: Secondary | ICD-10-CM | POA: Diagnosis present

## 2015-05-22 DIAGNOSIS — G92 Toxic encephalopathy: Secondary | ICD-10-CM | POA: Diagnosis present

## 2015-05-22 DIAGNOSIS — J90 Pleural effusion, not elsewhere classified: Secondary | ICD-10-CM | POA: Diagnosis present

## 2015-05-22 DIAGNOSIS — F10231 Alcohol dependence with withdrawal delirium: Secondary | ICD-10-CM | POA: Diagnosis present

## 2015-05-22 DIAGNOSIS — N179 Acute kidney failure, unspecified: Secondary | ICD-10-CM | POA: Diagnosis present

## 2015-05-22 HISTORY — DX: Inflammatory liver disease, unspecified: K75.9

## 2015-05-22 HISTORY — DX: Alcohol dependence, uncomplicated: F10.20

## 2015-05-22 LAB — CBC WITH DIFFERENTIAL/PLATELET
BASOS ABS: 0 10*3/uL (ref 0.0–0.1)
Basophils Relative: 0 %
EOS PCT: 0 %
Eosinophils Absolute: 0 10*3/uL (ref 0.0–0.7)
HEMATOCRIT: 32.8 % — AB (ref 39.0–52.0)
Hemoglobin: 11.4 g/dL — ABNORMAL LOW (ref 13.0–17.0)
Lymphocytes Relative: 3 %
Lymphs Abs: 0.4 10*3/uL — ABNORMAL LOW (ref 0.7–4.0)
MCH: 33.4 pg (ref 26.0–34.0)
MCHC: 34.8 g/dL (ref 30.0–36.0)
MCV: 96.2 fL (ref 78.0–100.0)
Monocytes Absolute: 1.6 10*3/uL — ABNORMAL HIGH (ref 0.1–1.0)
Monocytes Relative: 11 %
Neutro Abs: 12.9 10*3/uL — ABNORMAL HIGH (ref 1.7–7.7)
Neutrophils Relative %: 86 %
PLATELETS: 366 10*3/uL (ref 150–400)
RBC: 3.41 MIL/uL — AB (ref 4.22–5.81)
RDW: 12.1 % (ref 11.5–15.5)
WBC: 14.9 10*3/uL — AB (ref 4.0–10.5)

## 2015-05-22 LAB — COMPREHENSIVE METABOLIC PANEL
ALBUMIN: 2 g/dL — AB (ref 3.5–5.0)
ALT: 50 U/L (ref 17–63)
AST: 67 U/L — AB (ref 15–41)
Alkaline Phosphatase: 105 U/L (ref 38–126)
Anion gap: 14 (ref 5–15)
BILIRUBIN TOTAL: 1 mg/dL (ref 0.3–1.2)
BUN: 11 mg/dL (ref 6–20)
CHLORIDE: 92 mmol/L — AB (ref 101–111)
CO2: 16 mmol/L — ABNORMAL LOW (ref 22–32)
CREATININE: 0.95 mg/dL (ref 0.61–1.24)
Calcium: 8.4 mg/dL — ABNORMAL LOW (ref 8.9–10.3)
GFR calc Af Amer: >60 mL/min (ref >60)
GLUCOSE: 114 mg/dL — AB (ref 65–99)
Potassium: 4.2 mmol/L (ref 3.5–5.1)
Sodium: 122 mmol/L — ABNORMAL LOW (ref 135–145)
Total Protein: 6.8 g/dL (ref 6.5–8.1)

## 2015-05-22 LAB — CBC
HEMATOCRIT: 34.9 % — AB (ref 39.0–52.0)
Hemoglobin: 12.1 g/dL — ABNORMAL LOW (ref 13.0–17.0)
MCH: 33.3 pg (ref 26.0–34.0)
MCHC: 34.7 g/dL (ref 30.0–36.0)
MCV: 96.1 fL (ref 78.0–100.0)
Platelets: 401 10*3/uL — ABNORMAL HIGH (ref 150–400)
RBC: 3.63 MIL/uL — ABNORMAL LOW (ref 4.22–5.81)
RDW: 12 % (ref 11.5–15.5)
WBC: 16.5 10*3/uL — ABNORMAL HIGH (ref 4.0–10.5)

## 2015-05-22 LAB — BASIC METABOLIC PANEL
Anion gap: 15 (ref 5–15)
BUN: 11 mg/dL (ref 6–20)
CHLORIDE: 91 mmol/L — AB (ref 101–111)
CO2: 17 mmol/L — AB (ref 22–32)
Calcium: 8.7 mg/dL — ABNORMAL LOW (ref 8.9–10.3)
Creatinine, Ser: 1.06 mg/dL (ref 0.61–1.24)
GFR calc Af Amer: >60 mL/min (ref >60)
GFR calc non Af Amer: >60 mL/min (ref >60)
GLUCOSE: 121 mg/dL — AB (ref 65–99)
POTASSIUM: 4.7 mmol/L (ref 3.5–5.1)
Sodium: 123 mmol/L — ABNORMAL LOW (ref 135–145)

## 2015-05-22 LAB — URINALYSIS, ROUTINE W REFLEX MICROSCOPIC
Glucose, UA: NEGATIVE mg/dL
Hgb urine dipstick: NEGATIVE
Ketones, ur: 15 mg/dL — AB
Leukocytes, UA: NEGATIVE
NITRITE: NEGATIVE
PH: 6 (ref 5.0–8.0)
Protein, ur: NEGATIVE mg/dL
SPECIFIC GRAVITY, URINE: 1.011 (ref 1.005–1.030)

## 2015-05-22 LAB — I-STAT CG4 LACTIC ACID, ED: Lactic Acid, Venous: 1.38 mmol/L (ref 0.5–2.0)

## 2015-05-22 LAB — I-STAT TROPONIN, ED: Troponin i, poc: 0 ng/mL (ref 0.00–0.08)

## 2015-05-22 LAB — OSMOLALITY: Osmolality: 260 mOsm/kg — ABNORMAL LOW (ref 275–295)

## 2015-05-22 MED ORDER — ACETAMINOPHEN 325 MG PO TABS: 650.0000 mg | ORAL_TABLET | Freq: Four times a day (QID) | ORAL | Status: DC | PRN

## 2015-05-22 MED ORDER — ENSURE ENLIVE PO LIQD
237.0000 mL | Freq: Two times a day (BID) | ORAL | Status: DC
  Administered 2015-05-23 – 2015-05-24 (×3): 237 mL via ORAL

## 2015-05-22 MED ORDER — DEXTROSE 5 % IV SOLN
500.0000 mg | Freq: Once | INTRAVENOUS | Status: AC
  Administered 2015-05-22: 500 mg via INTRAVENOUS
  Filled 2015-05-22: qty 500

## 2015-05-22 MED ORDER — LORAZEPAM 2 MG/ML IJ SOLN: 1.0000 mg | Freq: Four times a day (QID) | INTRAMUSCULAR | Status: DC | PRN

## 2015-05-22 MED ORDER — LORAZEPAM 1 MG PO TABS: 1.0000 mg | ORAL_TABLET | Freq: Four times a day (QID) | ORAL | Status: DC | PRN

## 2015-05-22 MED ORDER — ONDANSETRON HCL 4 MG PO TABS: 4.0000 mg | ORAL_TABLET | Freq: Four times a day (QID) | ORAL | Status: DC | PRN

## 2015-05-22 MED ORDER — VITAMIN B-1 100 MG PO TABS
100.0000 mg | ORAL_TABLET | Freq: Every day | ORAL | Status: DC
  Administered 2015-05-23 – 2015-05-24 (×2): 100 mg via ORAL
  Filled 2015-05-22 (×2): qty 1

## 2015-05-22 MED ORDER — AZITHROMYCIN 500 MG PO TABS: 500.0000 mg | ORAL_TABLET | ORAL | Status: DC

## 2015-05-22 MED ORDER — ZOLPIDEM TARTRATE 5 MG PO TABS
5.0000 mg | ORAL_TABLET | Freq: Every evening | ORAL | Status: DC | PRN
  Filled 2015-05-22: qty 1

## 2015-05-22 MED ORDER — ADULT MULTIVITAMIN W/MINERALS CH
1.0000 | ORAL_TABLET | Freq: Every day | ORAL | Status: DC
  Administered 2015-05-23 – 2015-05-24 (×2): 1 via ORAL
  Filled 2015-05-22 (×2): qty 1

## 2015-05-22 MED ORDER — FOLIC ACID 1 MG PO TABS
1.0000 mg | ORAL_TABLET | Freq: Every day | ORAL | Status: DC
  Administered 2015-05-23 – 2015-05-24 (×2): 1 mg via ORAL
  Filled 2015-05-22 (×2): qty 1

## 2015-05-22 MED ORDER — ONDANSETRON HCL 4 MG/2ML IJ SOLN: 4.0000 mg | Freq: Four times a day (QID) | INTRAMUSCULAR | Status: DC | PRN

## 2015-05-22 MED ORDER — DEXTROSE 5 % IV SOLN
1.0000 g | INTRAVENOUS | Status: DC
  Filled 2015-05-22: qty 10

## 2015-05-22 MED ORDER — SODIUM CHLORIDE 0.9 % IV SOLN
INTRAVENOUS | Status: DC
  Administered 2015-05-22 – 2015-05-24 (×3): via INTRAVENOUS

## 2015-05-22 MED ORDER — THIAMINE HCL 100 MG/ML IJ SOLN: 100.0000 mg | Freq: Every day | INTRAMUSCULAR | Status: DC

## 2015-05-22 MED ORDER — SODIUM CHLORIDE 0.9 % IV BOLUS (SEPSIS)
250.0000 mL | Freq: Once | INTRAVENOUS | Status: AC
  Administered 2015-05-22: 250 mL via INTRAVENOUS

## 2015-05-22 MED ORDER — ACETAMINOPHEN 650 MG RE SUPP: 650.0000 mg | Freq: Four times a day (QID) | RECTAL | Status: DC | PRN

## 2015-05-22 MED ORDER — SODIUM CHLORIDE 0.9 % IV BOLUS (SEPSIS)
1000.0000 mL | Freq: Once | INTRAVENOUS | Status: AC
  Administered 2015-05-22: 1000 mL via INTRAVENOUS

## 2015-05-22 MED ORDER — SODIUM CHLORIDE 0.9 % IV BOLUS (SEPSIS)
500.0000 mL | Freq: Once | INTRAVENOUS | Status: AC
  Administered 2015-05-22: 500 mL via INTRAVENOUS

## 2015-05-22 MED ORDER — CEFTRIAXONE SODIUM 2 G IJ SOLR
2.0000 g | Freq: Once | INTRAMUSCULAR | Status: AC
  Administered 2015-05-22: 2 g via INTRAVENOUS
  Filled 2015-05-22: qty 2

## 2015-05-22 NOTE — Progress Notes (Signed)
Pharmacy Code Sepsis Protocol  Time of code sepsis page: 1838 []  Antibiotics delivered at  []  Antibiotics administered prior to code at (if checked, omit next 2 questions)  Were antibiotics ordered at the time of the code sepsis page? No Was it required to contact the physician? [x]  Physician not contacted []  Physician contacted to order antibiotics for code sepsis []  Physician contacted to recommend changing antibiotics  Pharmacy consulted for: N/A  Anti-infectives    Start     Dose/Rate Route Frequency Ordered Stop   05/22/15 1845  cefTRIAXone (ROCEPHIN) 2 g in dextrose 5 % 50 mL IVPB     2 g 100 mL/hr over 30 Minutes Intravenous  Once 05/22/15 1843     05/22/15 1845  azithromycin (ZITHROMAX) 500 mg in dextrose 5 % 250 mL IVPB     500 mg 250 mL/hr over 60 Minutes Intravenous  Once 05/22/15 1843          Nurse education provided: [x]  Minutes left to administer antibiotics to achieve 1 hour goal []  Correct order of antibiotic administration []  Antibiotic Y-site compatibilities      Treasure Ochs D. Laney Potashang, PharmD, BCPS Pager:  [phone] 05/22/2015, 7:05 PM

## 2015-05-22 NOTE — Progress Notes (Signed)
Received report from ED RN, Joyce. 

## 2015-05-22 NOTE — H&P (Signed)
History and Physical  Patient Name: Brent Warren     EXB:[id number]    DOB: 02/28/1960    DOA: 05/22/2015 PCP: No primary care provider on file.   Patient coming from: Home  Chief Complaint: Cough, purulent sputum, chest pain  HPI: Brent Warren is a 55 y.o. male with a past medical history significant for alcohol dependence who presents with cough.   The patient is unemployed and homeless and lives with his brother. He drinks 240 ounce beers per day, gets mild withdrawal symptoms without alcohol.  Over the last 3 days, he has noticed onset of worsening cough, purulent green sputum, pleuritic chest pain, dyspnea.  He hasn't slept in a week because of cough. Today his brother that he was confused and he felt more ill and short of breath, so they came to the ER.  He denies fever, chills, leg swelling, orthopnea, PND.  In the ED: -febrile to 102F, tachycardic to 146, breathing 22 times a minute, BP normal, saturating well on ambient air.   -Na 123, K 4.7, Cr 1.0 (previous last fall 0.6), HCO3 17 with normal gap, WBC 16.5K, Hgb 12. -Troponin was negative, an ECG showed sinus tachycardia. -A 1 view chest x-rays showed right lower lobe opacity with effusion.  -Sepsis protocol was started, culture data was obtain and antibiotics and 30 cc/kg bolus administered.    Review of Systems:  All other systems negative except as just noted or noted in the history of present illness.    Past Medical History  Diagnosis Date  . Alcoholic [facility](HCC)     Past Surgical History  Procedure Laterality Date  . 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  Drinks daily, usually about two 40oz beers.  Smokes actively.  The patient walks unassisted.    No Known Allergies  Family history: family history includes Cirrhosis in his father; Heart attack in his mother.  Prior to Admission medications   Medication Sig  Start Date End Date Taking? Authorizing Provider  None     Physical Exam: BP 127/88 mmHg  Pulse 119  Temp(Src) 102 F (38.9 C) (Rectal)  Resp 27  Ht 5\' 9"  (1.753 m)  Wt 55.339 kg (122 lb)  BMI 18.01 kg/m2  SpO2 96% General appearance: Thin small statured adult male, alert and in no acute distress.  In some discomfort with breathing.   Eyes: Anicteric, conjunctiva pink, lids and lashes normal.     ENT: No nasal deformity, discharge, or epistaxis.  OP moist without lesions.   Lymph: No cervical or supraclavicular lymphadenopathy. Skin: Warm and dry.  No jaundice.  No suspicious rashes or lesions. Cardiac: Tachycardic, nl S1-S2, no murmurs appreciated.  Capillary refill is brisk.  JVP normal.  No LE edema.  Radial pulses 2+ and symmetric. Respiratory: Tachypnea.  No wheezes.  I do not appreciate rales.  Breath sounds diminished at both bases, there is dullness to percussion about 4 in on the RIGHT base. Abdomen: Abdomen soft without rigidity.  Mild right sided TTP and guarding voluntary. No ascites, distension.   MSK: No deformities or effusions. Neuro: Cranial nerves intact.  Sensorium intact and responding to questions, attention normal.  Speech is fluent.  Moves all extremities equally and with normal coordination.    Psych: Behavior appropriate.  Affect normal.  No evidence of aural or visual hallucinations or delusions.  Labs on Admission:  I have personally reviewed following labs and imaging studies: CBC:  Recent Labs Lab 05/22/15 1735 05/22/15 1830  WBC 16.5* 14.9*  NEUTROABS  --  12.9*  HGB 12.1* 11.4*  HCT 34.9* 32.8*  MCV 96.1 96.2  PLT 401* 366   Basic Metabolic Panel:  Recent Labs Lab 05/22/15 1735 05/22/15 1830  NA 123* 122*  K 4.7 4.2  CL 91* 92*  CO2 17* 16*  GLUCOSE 121* 114*  BUN 11 11  CREATININE 1.06 0.95  CALCIUM 8.7* 8.4*   GFR: Estimated Creatinine Clearance: 68.7 mL/min (by C-G formula based on Cr of 0.95). Liver Function  Tests:  Recent Labs Lab 05/22/15 1830  AST 67*  ALT 50  ALKPHOS 105  BILITOT 1.0  PROT 6.8  ALBUMIN 2.0*   No results for input(s): LIPASE, AMYLASE in the last 168 hours. No results for input(s): AMMONIA in the last 168 hours. Coagulation Profile: No results for input(s): INR, PROTIME in the last 168 hours. Cardiac Enzymes: No results for input(s): CKTOTAL, CKMB, CKMBINDEX, TROPONINI in the last 168 hours. BNP (last 3 results) No results for input(s): PROBNP in the last 8760 hours. HbA1C: No results for input(s): HGBA1C in the last 72 hours. CBG: No results for input(s): GLUCAP in the last 168 hours. Lipid Profile: No results for input(s): CHOL, HDL, LDLCALC, TRIG, CHOLHDL, LDLDIRECT in the last 72 hours. Thyroid Function Tests: No results for input(s): TSH, T4TOTAL, FREET4, T3FREE, THYROIDAB in the last 72 hours. Anemia Panel: No results for input(s): VITAMINB12, FOLATE, FERRITIN, TIBC, IRON, RETICCTPCT in the last 72 hours. Sepsis Labs: Lactate normal. (procalcitonin:4,lacticidven:4) )No results found for this or any previous visit (from the past 240 hour(s)).       Radiological Exams on Admission: Personally reviewed: Dg Chest Portable 1 View  05/22/2015  CLINICAL DATA:  Chest pain; altered mental status EXAM: PORTABLE CHEST 1 VIEW COMPARISON:  08/04/2013 heart is normal in size. There is dense consolidation at the right lung base which obscures the hemidiaphragm. Right pleural effusion is associated. Left lung is clear. FINDINGS: The heart size and mediastinal contours are within normal limits. Both lungs are clear. The visualized skeletal structures are unremarkable. IMPRESSION: Right lower lobe infiltrate and pleural effusion. Followup PA and lateral chest X-ray is recommended in 3-4 weeks following trial of antibiotic therapy to ensure resolution and exclude underlying malignancy. Electronically Signed   By: Norva Pavlov M.D.   On: 05/22/2015 18:02     EKG: Independently reviewed. Rate 148, sinus rhythm, QTc normal, no ischemic changes.    Assessment/Plan 1. CAP with parapneumonic effusion and sepsis:  Cough, fever, green sputum, alcohol dependence, smoking.  A one view suggests moderate effusion.     -Ceftriaxone and azithromycin for CAP -Lateral x-ray to better visualize effusion -If more than trivial effusion, will discuss thoracentesis with Pulmonology or IR -Sputum culture and gram stain -HIV ordered -Strep adn legionella antigens ordered -Follow blood cultures  Technically meets sepsis criteria.  Suspected source lung. Organism unknown. Patient meets criteria given tachycardia, tachypnea, fever, leukocytosis, and evidence of organ dysfunction (elevated creatinine, elevated LFTs and reported AMS).  Lactate normal at arrival.  This patient is at high risk of poor outcomes with a SOFA score of 2 (at least 2 of the following clinical criteria: respiratory rate of 22/min or greater, altered mentation, or systolic blood pressure of 100 mm Hg or less).  Antibiotics delivered in the ED.    -Sepsis bundle utilized:  -Blood and  urine cultures drawn  -30 ml/kg bolus given in ED, will repeat lactic acid  -Start targeted antibiotics with CTX and azithro, based on suspected source of infection    -Repeat renal function and complete blood count in AM  -Code SEPSIS called to E-link    2. Hyponatremia:  Likely hypovolemic. -Fluids and trend BMP -Check free water clearance -Repeat CXR in 6 weeks  3. Alcohol dependence:  Tachycardic at arrival, but otherwise few signs of withdrawal on my exam. -Med surg CIWA protocol with on demand lorazepam -Ambien PRN for sleep  4. Elevated creatinine:  Either dehydration or AKI. -Check UA and urine lytes -Fluid challenge and repeat BMP  5. Elevated AST:  Platelets normal and no stigmata of liver disease, but albumin low.  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   -Check hep C Ab      DVT  prophylaxis: Lovenox  Code Status: FULL  Family Communication: None present at my interview  Disposition Plan: Anticipate inpatient admission for CAP with probable effusion.  Antibiotics and fluids for sepsis tonight.  Reimage effusion and possibly tap if needed. Consults called: None Admission status: Inpatient, medical surgical bed   Medical decision making: Patient seen at 8:03 PM on 05/22/2015.  The patient was discussed with Dr. Donnald Garre. What exists of the patient's chart was reviewed in depth.  Clinical condition: stable, BP normal and HR improving with fluids, mentation currently normal, not requiring oxygen at present, stable for med surg bed.        Alberteen Sam [facility] Pager [phone]

## 2015-05-22 NOTE — ED Notes (Signed)
MD at bedside. 
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
## 2015-05-22 NOTE — ED Notes (Signed)
EDP aware pt presenting w/ tachypnea, tachycardia, febrile, mildly hallucinating, withdrawing from alcohol.
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
## 2015-05-23 LAB — CBC
HEMATOCRIT: 29.1 % — AB (ref 39.0–52.0)
HEMOGLOBIN: 10.3 g/dL — AB (ref 13.0–17.0)
MCH: 34.3 pg — ABNORMAL HIGH (ref 26.0–34.0)
MCHC: 35.4 g/dL (ref 30.0–36.0)
MCV: 97 fL (ref 78.0–100.0)
Platelets: 427 10*3/uL — ABNORMAL HIGH (ref 150–400)
RBC: 3 MIL/uL — ABNORMAL LOW (ref 4.22–5.81)
RDW: 12.3 % (ref 11.5–15.5)
WBC: 16.4 10*3/uL — AB (ref 4.0–10.5)

## 2015-05-23 LAB — COMPREHENSIVE METABOLIC PANEL
ALK PHOS: 88 U/L (ref 38–126)
ALT: 42 U/L (ref 17–63)
AST: 63 U/L — AB (ref 15–41)
Albumin: 1.6 g/dL — ABNORMAL LOW (ref 3.5–5.0)
Anion gap: 11 (ref 5–15)
BILIRUBIN TOTAL: 0.8 mg/dL (ref 0.3–1.2)
BUN: 9 mg/dL (ref 6–20)
CALCIUM: 7.6 mg/dL — AB (ref 8.9–10.3)
CO2: 19 mmol/L — ABNORMAL LOW (ref 22–32)
CREATININE: 0.62 mg/dL (ref 0.61–1.24)
Chloride: 99 mmol/L — ABNORMAL LOW (ref 101–111)
GFR calc Af Amer: >60 mL/min (ref >60)
GLUCOSE: 104 mg/dL — AB (ref 65–99)
POTASSIUM: 3.8 mmol/L (ref 3.5–5.1)
Sodium: 129 mmol/L — ABNORMAL LOW (ref 135–145)
TOTAL PROTEIN: 5.7 g/dL — AB (ref 6.5–8.1)

## 2015-05-23 LAB — HIV ANTIBODY (ROUTINE TESTING W REFLEX): HIV Screen 4th Generation wRfx: NONREACTIVE

## 2015-05-23 LAB — EXPECTORATED SPUTUM ASSESSMENT W GRAM STAIN, RFLX TO RESP C

## 2015-05-23 LAB — OSMOLALITY, URINE: OSMOLALITY UR: 249 mosm/kg — AB (ref 300–900)

## 2015-05-23 MED ORDER — AMOXICILLIN-POT CLAVULANATE 875-125 MG PO TABS
1.0000 | ORAL_TABLET | Freq: Two times a day (BID) | ORAL | Status: DC
  Administered 2015-05-24: 1 via ORAL
  Filled 2015-05-23: qty 1

## 2015-05-23 MED ORDER — TRAMADOL HCL 50 MG PO TABS
50.0000 mg | ORAL_TABLET | Freq: Four times a day (QID) | ORAL | Status: DC | PRN
  Administered 2015-05-23 – 2015-05-24 (×2): 50 mg via ORAL
  Filled 2015-05-23 (×2): qty 1

## 2015-05-23 MED ORDER — AZITHROMYCIN 500 MG PO TABS
500.0000 mg | ORAL_TABLET | ORAL | Status: AC
Start: 2015-05-23 — End: 2015-05-23
  Administered 2015-05-23: 500 mg via ORAL
  Filled 2015-05-23: qty 1

## 2015-05-23 MED ORDER — DEXTROSE 5 % IV SOLN
1.0000 g | INTRAVENOUS | Status: AC
  Administered 2015-05-23: 1 g via INTRAVENOUS
  Filled 2015-05-23: qty 10

## 2015-05-23 MED ORDER — GUAIFENESIN-DM 100-10 MG/5ML PO SYRP
5.0000 mL | ORAL_SOLUTION | ORAL | Status: DC | PRN
  Administered 2015-05-23 – 2015-05-24 (×5): 5 mL via ORAL
  Filled 2015-05-23 (×5): qty 5

## 2015-05-23 NOTE — Progress Notes (Signed)
NURSING PROGRESS NOTE  Brent Warren [id number] Admission Data: 05/23/2015 1:09 AM Attending Provider: No att. providers found PCP:No primary care provider on file. Code Status: FULL  Allergies:  Review of patient's allergies indicates no known allergies. Past Medical History:   has a past medical history of Alcoholic (HCC). Past Surgical History:   has past surgical history that includes arm surgery. Social History:   reports that he has been smoking.  He does not have any smokeless tobacco history on file. He reports that he drinks alcohol. He reports that he does not use illicit drugs.  Brent Warren is a 55 y.o. male patient admitted from ED:   Last Documented Vital Signs: Blood pressure 112/82, pulse 93, temperature 98 F (36.7 C), temperature source Oral, resp. rate 18, height 5' 10.8" (1.798 m), weight 57.788 kg (127 lb 6.4 oz), SpO2 95 %.  Cardiac Monitoring: Box # 28 in place. Cardiac monitor yields:normal sinus rhythm.  IV Fluids:  IV in place, occlusive dsg intact without redness, IV cath wrist left, condition patent and no redness none. IV cath forearm right, condition patent and no redness none.  Skin: Appropriate for ethnicity and intact.  Patient orientated to room. Information packet given to patient. Admission inpatient armband information verified with patientto include name and date of birth and placed on patient arm. Side rails up x 2, fall assessment and education completed with patient. Patient able to verbalize understanding of risk associated with falls and verbalized understanding to call for assistance before getting out of bed. Call light within reach. Patient able to voice and demonstrate understanding of unit orientation instructions.    Will continue to evaluate and treat per MD orders.  Sue LushKaelin Romesberg RN, BSN

## 2015-05-23 NOTE — Progress Notes (Signed)
Initial Nutrition Assessment  DOCUMENTATION CODES:   Non-severe (moderate) malnutrition in context of social or environmental circumstances, Underweight  INTERVENTION:   -Continue Ensure Enlive po BID, each supplement provides 350 kcal and 20 grams of protein  NUTRITION DIAGNOSIS:   Malnutrition related to social / environmental circumstances as evidenced by moderate depletions of muscle mass, moderate depletion of body fat.  GOAL:   Patient will meet greater than or equal to 90% of their needs  MONITOR:   PO intake, Supplement acceptance, Labs, Weight trends, Skin, I & O's  REASON FOR ASSESSMENT:   Malnutrition Screening Tool    ASSESSMENT:   Brent Warren is a 55 y.o. male with a past medical history significant for alcohol dependence who presents with cough.  Pt admitted with CAP with parapneumonic effusion and sepsis.   Hx obtained from pt at bedside. He reports he's feeling a bit better, however, his biggest complaint is coughing up excess sputum.   Pt reveals a general decline in health over the past year. He attributes this decline partially to the loss of multiple family members, including his mother and multiple aunts/uncles and siblings. He reveals his UBW is around 145#, but is unable to provide further details about weight hx.   Pt acknowledges that his alcoholism has attributes to a lot of health issues. He reports he has "good days and bad days", however, in general "I don't really eat, I just drink". Per chart review, pt consumes 240 ounces of beer daily. Pt reveals his appetite is slowly returning and consumed 50% of his eggs at breakfast. Pt consumed about 50% of Ensure supplement and reports he likes the supplements, sipping on them throughout the day.   Discussed importance of good meal and supplement intake to promote healing. Encouraged pt to continue consuming supplements or other high protein liquids (such as milk) to help prevent further weight loss as  well as preserve muscle mass.   Nutrition-Focused physical exam completed. Findings are mild to moderate fat depletion, mild to moderate muscle depletion, and no edema. Pt reports that he is still fairly independent, but "slower moving". Pt reports he has noticed losses in muscle tone.   Medications reviewed. Pt receiving MVI, folic acid, and thiamine supplementation.   Labs reviewed: Na: 129 (on IV supplementation).    Diet Order:  Diet regular Room service appropriate?: Yes; Fluid consistency:: Thin  Skin:  Reviewed, no issues  Last BM:  05/23/15  Height:   Ht Readings from Last 1 Encounters:  05/22/15 5' 10.8" (1.798 m)    Weight:   Wt Readings from Last 1 Encounters:  05/22/15 127 lb 6.4 oz (57.788 kg)    Ideal Body Weight:  78.2 kg  BMI:  Body mass index is 17.88 kg/(m^2).  Estimated Nutritional Needs:   Kcal:  1800-2000  Protein:  85-100 grams  Fluid:  1.8-2.0 L  EDUCATION NEEDS:   Education needs addressed  Brent Warren A. Mayford KnifeWilliams, RD, LDN, CDE Pager: [phone] After hours Pager: [phone]
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
## 2015-05-24 LAB — CBC WITH DIFFERENTIAL/PLATELET
BASOS ABS: 0 10*3/uL (ref 0.0–0.1)
BASOS PCT: 0 %
EOS ABS: 0.1 10*3/uL (ref 0.0–0.7)
Eosinophils Relative: 1 %
HCT: 30.3 % — ABNORMAL LOW (ref 39.0–52.0)
Hemoglobin: 10.3 g/dL — ABNORMAL LOW (ref 13.0–17.0)
Lymphocytes Relative: 6 %
Lymphs Abs: 0.9 10*3/uL (ref 0.7–4.0)
MCH: 32.8 pg (ref 26.0–34.0)
MCHC: 34 g/dL (ref 30.0–36.0)
MCV: 96.5 fL (ref 78.0–100.0)
MONOS PCT: 11 %
Monocytes Absolute: 1.6 10*3/uL — ABNORMAL HIGH (ref 0.1–1.0)
NEUTROS PCT: 82 %
Neutro Abs: 11.6 10*3/uL — ABNORMAL HIGH (ref 1.7–7.7)
PLATELETS: 439 10*3/uL — AB (ref 150–400)
RBC: 3.14 MIL/uL — ABNORMAL LOW (ref 4.22–5.81)
RDW: 12.2 % (ref 11.5–15.5)
WBC: 14.2 10*3/uL — AB (ref 4.0–10.5)

## 2015-05-24 LAB — PROTIME-INR
INR: 1.04 (ref 0.00–1.49)
PROTHROMBIN TIME: 13.8 s (ref 11.6–15.2)

## 2015-05-24 LAB — COMPREHENSIVE METABOLIC PANEL
ALBUMIN: 1.6 g/dL — AB (ref 3.5–5.0)
ALT: 48 U/L (ref 17–63)
ANION GAP: 15 (ref 5–15)
AST: 82 U/L — AB (ref 15–41)
Alkaline Phosphatase: 99 U/L (ref 38–126)
BUN: 6 mg/dL (ref 6–20)
CHLORIDE: 95 mmol/L — AB (ref 101–111)
CO2: 22 mmol/L (ref 22–32)
Calcium: 8.1 mg/dL — ABNORMAL LOW (ref 8.9–10.3)
Creatinine, Ser: 0.66 mg/dL (ref 0.61–1.24)
GFR calc Af Amer: >60 mL/min (ref >60)
GFR calc non Af Amer: >60 mL/min (ref >60)
GLUCOSE: 101 mg/dL — AB (ref 65–99)
POTASSIUM: 3.3 mmol/L — AB (ref 3.5–5.1)
SODIUM: 132 mmol/L — AB (ref 135–145)
Total Bilirubin: 0.5 mg/dL (ref 0.3–1.2)
Total Protein: 5.6 g/dL — ABNORMAL LOW (ref 6.5–8.1)

## 2015-05-24 LAB — HEPATITIS C ANTIBODY: HCV Ab: <0.1 s/co ratio (ref 0.0–0.9)

## 2015-05-24 MED ORDER — AMOXICILLIN-POT CLAVULANATE 875-125 MG PO TABS: 1.0000 | ORAL_TABLET | Freq: Two times a day (BID) | ORAL | Status: DC

## 2015-05-24 MED FILL — AMOX-CLAV 875-125 MG TABLET: 875-125 | 3 days supply | Qty: 6 | Fill #0

## 2015-05-24 NOTE — Progress Notes (Signed)
Pt given discharge instructions, prescriptions, and care notes. Pt verbalized understanding AEB no further questions or concerns at this time. IV was discontinued, no redness, pain, or swelling noted at this time. Pt left the floor via ambulation with staff in stable condition. 
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
## 2015-05-24 NOTE — Care Management Note (Signed)
Case Management Note  Patient Details  Name: Charleston RopesRonnie Walder MRN: [id number] Date of Birth: 06/19/1960  Subjective/Objective:                 Admitted with Sepsis. Hx of ETOH abuse, homelessness.   Action/Plan: Plan is to d/c today. Pt states will be living with brother  short term @ d/c. CM scheduled post f/u [facility]. Pt made aware per CM.  Expected Discharge Date:    05/24/2015              Expected Discharge Plan:   Assuming care for self.  In-House Referral:   CSW  Discharge planning Services     Post Acute Care Choice:    Choice offered to:     DME Arranged:    DME Agency:     HH Arranged:    HH Agency:     Status of Service:  completed   Epifanio LeschesCole, Aldahir Litaker Hudson, ArizonaRN,BSN,CM [phone] 05/24/2015, 10:44 AM

## 2015-05-24 NOTE — Progress Notes (Signed)
CM received consult:Has [place] Card [facility] Per brother. [facility]. CM made pt aware.

## 2015-05-25 LAB — URINE CULTURE: Culture: 30000 — AB

## 2015-05-25 LAB — CULTURE, RESPIRATORY: CULTURE: NORMAL

## 2015-05-25 LAB — CULTURE, RESPIRATORY W GRAM STAIN

## 2015-05-27 LAB — CULTURE, BLOOD (ROUTINE X 2)
CULTURE: NO GROWTH
Culture: NO GROWTH

## 2015-05-31 ENCOUNTER — Encounter: Payer: Self-pay | Admitting: Internal Medicine

## 2015-05-31 ENCOUNTER — Ambulatory Visit (HOSPITAL_BASED_OUTPATIENT_CLINIC_OR_DEPARTMENT_OTHER): Payer: Self-pay | Admitting: Internal Medicine

## 2015-05-31 ENCOUNTER — Emergency Department (HOSPITAL_COMMUNITY)
Admission: RE | Admit: 2015-05-31 | Discharge: 2015-05-31 | Disposition: A | Discharge status: Home / Self Care | Payer: MEDICAID | Attending: Internal Medicine | Admitting: Internal Medicine

## 2015-05-31 ENCOUNTER — Emergency Department (HOSPITAL_COMMUNITY)
Admission: EM | Admit: 2015-05-31 | Discharge: 2015-05-31 | Discharge status: Absent without leave | Payer: MEDICAID | Attending: Internal Medicine | Admitting: Internal Medicine

## 2015-05-31 ENCOUNTER — Ambulatory Visit (HOSPITAL_COMMUNITY)
Admission: RE | Admit: 2015-05-31 | Discharge: 2015-05-31 | Disposition: A | Discharge status: Home / Self Care | Payer: Self-pay | Source: Ambulatory Visit | Attending: Internal Medicine | Admitting: Internal Medicine

## 2015-05-31 VITALS — BP 154/98 | HR 97 | Temp 97.3°F | Resp 22 | Wt 123.2 lb

## 2015-05-31 DIAGNOSIS — F17210 Nicotine dependence, cigarettes, uncomplicated: Secondary | ICD-10-CM | POA: Insufficient documentation

## 2015-05-31 DIAGNOSIS — Z79899 Other long term (current) drug therapy: Secondary | ICD-10-CM

## 2015-05-31 DIAGNOSIS — J441 Chronic obstructive pulmonary disease with (acute) exacerbation: Secondary | ICD-10-CM

## 2015-05-31 DIAGNOSIS — Z792 Long term (current) use of antibiotics: Secondary | ICD-10-CM

## 2015-05-31 DIAGNOSIS — F1020 Alcohol dependence, uncomplicated: Secondary | ICD-10-CM

## 2015-05-31 DIAGNOSIS — J189 Pneumonia, unspecified organism: Secondary | ICD-10-CM

## 2015-05-31 DIAGNOSIS — J948 Other specified pleural conditions: Secondary | ICD-10-CM | POA: Insufficient documentation

## 2015-05-31 DIAGNOSIS — Z8249 Family history of ischemic heart disease and other diseases of the circulatory system: Secondary | ICD-10-CM | POA: Insufficient documentation

## 2015-05-31 DIAGNOSIS — Z8379 Family history of other diseases of the digestive system: Secondary | ICD-10-CM | POA: Insufficient documentation

## 2015-05-31 DIAGNOSIS — R0602 Shortness of breath: Secondary | ICD-10-CM

## 2015-05-31 DIAGNOSIS — R062 Wheezing: Secondary | ICD-10-CM

## 2015-05-31 DIAGNOSIS — Z720 Tobacco use: Secondary | ICD-10-CM

## 2015-05-31 LAB — BASIC METABOLIC PANEL WITH GFR
BUN: 9 mg/dL (ref 7–25)
CO2: 25 mmol/L (ref 20–31)
Calcium: 8.9 mg/dL (ref 8.6–10.3)
Chloride: 96 mmol/L — ABNORMAL LOW (ref 98–110)
Creat: 0.51 mg/dL — ABNORMAL LOW (ref 0.70–1.33)
GFR, Est Non African American: >89 mL/min (ref >=60)
Glucose, Bld: 112 mg/dL — ABNORMAL HIGH (ref 65–99)
Potassium: 4.2 mmol/L (ref 3.5–5.3)
SODIUM: 135 mmol/L (ref 135–146)

## 2015-05-31 MED ORDER — BENZONATATE 100 MG PO CAPS: 100.0000 mg | ORAL_CAPSULE | Freq: Three times a day (TID) | ORAL | Status: DC | PRN

## 2015-05-31 MED ORDER — PREDNISONE 20 MG PO TABS: 40.0000 mg | ORAL_TABLET | Freq: Every day | ORAL | Status: DC

## 2015-05-31 MED ORDER — ALBUTEROL SULFATE (2.5 MG/3ML) 0.083% IN NEBU: 2.5000 mg | INHALATION_SOLUTION | Freq: Four times a day (QID) | RESPIRATORY_TRACT | Status: DC | PRN

## 2015-05-31 MED ORDER — IPRATROPIUM-ALBUTEROL 0.5-2.5 (3) MG/3ML IN SOLN
3.0000 mL | Freq: Once | RESPIRATORY_TRACT | Status: AC
  Administered 2015-05-31: 3 mL via RESPIRATORY_TRACT

## 2015-05-31 MED ORDER — GUAIFENESIN ER 600 MG PO TB12: 600.0000 mg | ORAL_TABLET | Freq: Two times a day (BID) | ORAL | Status: DC

## 2015-05-31 MED ORDER — BUDESONIDE-FORMOTEROL FUMARATE 80-4.5 MCG/ACT IN AERO: 2.0000 | INHALATION_SPRAY | Freq: Two times a day (BID) | RESPIRATORY_TRACT | Status: DC

## 2015-05-31 MED ORDER — NICOTINE 21 MG/24HR TD PT24: 21.0000 mg | MEDICATED_PATCH | Freq: Every day | TRANSDERMAL | Status: DC

## 2015-05-31 MED ORDER — LEVOFLOXACIN 500 MG PO TABS: 500.0000 mg | ORAL_TABLET | Freq: Every day | ORAL | Status: DC

## 2015-05-31 MED FILL — predniSONE 20 MG TABS: 20 | 8 days supply | Qty: 12 | Fill #0

## 2015-05-31 NOTE — Patient Instructions (Signed)
You Can Quit Smoking If you are ready to quit smoking or are thinking about it, congratulations! You have chosen to help yourself be healthier and live longer! There are lots of different ways to quit smoking. Nicotine gum, nicotine patches, a nicotine inhaler, or nicotine nasal spray can help with physical craving. Hypnosis, support groups, and medicines help break the habit of smoking. TIPS TO GET OFF AND STAY OFF CIGARETTES  Learn to predict your moods. Do not let a bad situation be your excuse to have a cigarette. Some situations in your life might tempt you to have a cigarette.  Ask friends and co-workers not to smoke around you.  Make your home smoke-free.  Never have "just one" cigarette. It leads to wanting another and another. Remind yourself of your decision to quit.  On a card, make a list of your reasons for not smoking. Read it at least the same number of times a day as you have a cigarette. Tell yourself everyday, "I do not want to smoke. I choose not to smoke."  Ask someone at home or work to help you with your plan to quit smoking.  Have something planned after you eat or have a cup of coffee. Take a walk or get other exercise to perk you up. This will help to keep you from overeating.  Try a relaxation exercise to calm you down and decrease your stress. Remember, you may be tense and nervous the first two weeks after you quit. This will pass.  Find new activities to keep your hands busy. Play with a pen, coin, or rubber band. Doodle or draw things on paper.  Brush your teeth right after eating. This will help cut down the craving for the taste of tobacco after meals. You can try mouthwash too.  Try gum, breath mints, or diet candy to keep something in your mouth. IF YOU SMOKE AND WANT TO QUIT:  Do not stock up on cigarettes. Never buy a carton. Wait until one pack is finished before you buy another.  Never carry cigarettes with you at work or at home.  Keep cigarettes  as far away from you as possible. Leave them with someone else.  Never carry matches or a lighter with you.  Ask yourself, "Do I need this cigarette or is this just a reflex?"  Bet with someone that you can quit. Put cigarette money in a piggy bank every morning. If you smoke, you give up the money. If you do not smoke, by the end of the week, you keep the money.  Keep trying. It takes 21 days to change a habit!  Talk to your doctor about using medicines to help you quit. These include nicotine replacement gum, lozenges, or skin patches.   This information is not intended to replace advice given to you by your health care provider. Make sure you discuss any questions you have with your health care provider.   Document Released: 10/21/2008 Document Revised: 03/19/2011 Document Reviewed: 10/21/2008 Elsevier Interactive Patient Education 2016 Elsevier Inc.   Acute Bronchitis Bronchitis is when the airways that extend from the windpipe into the lungs get red, puffy, and painful (inflamed). Bronchitis often causes thick spit (mucus) to develop. This leads to a cough. A cough is the most common symptom of bronchitis. In acute bronchitis, the condition usually begins suddenly and goes away over time (usually in 2 weeks). Smoking, allergies, and asthma can make bronchitis worse. Repeated episodes of bronchitis may cause more lung problems.  HOME CARE  Rest.  Drink enough fluids to keep your pee (urine) clear or pale yellow (unless you need to limit fluids as told by your doctor).  Only take over-the-counter or prescription medicines as told by your doctor.  Avoid smoking and secondhand smoke. These can make bronchitis worse. If you are a smoker, think about using nicotine gum or skin patches. Quitting smoking will help your lungs heal faster.  Reduce the chance of getting bronchitis again by:  Washing your hands often.  Avoiding people with cold symptoms.  Trying not to touch your hands  to your mouth, nose, or eyes.  Follow up with your doctor as told. GET HELP IF: Your symptoms do not improve after 1 week of treatment. Symptoms include:  Cough.  Fever.  Coughing up thick spit.  Body aches.  Chest congestion.  Chills.  Shortness of breath.  Sore throat. GET HELP RIGHT AWAY IF:   You have an increased fever.  You have chills.  You have severe shortness of breath.  You have bloody thick spit (sputum).  You throw up (vomit) often.  You lose too much body fluid (dehydration).  You have a severe headache.  You faint. MAKE SURE YOU:   Understand these instructions.  Will watch your condition.  Will get help right away if you are not doing well or get worse.   This information is not intended to replace advice given to you by your health care provider. Make sure you discuss any questions you have with your health care provider.   Document Released: 06/13/2007 Document Revised: 08/27/2012 Document Reviewed: 06/17/2012 Elsevier Interactive Patient Education [employer]2016 Else[employer] Inc.

## 2015-05-31 NOTE — Progress Notes (Signed)
Brent Warren, is a 54 y.o. male  ZOX:[id number]  [id number]:[id number]  DOB - [birth year]  CC:  Chief Complaint  Patient presents with  . Hospitalization Follow-up    Pneumonia       HPI: Brent Warren is a 55 y.o. male here today to establish medical care, w/ recent hospitalization 5/14-5/16 for pna, cxr noted w/ right lung base ateectasis/infiltrate. Pt states he finished antibiotics (augmentin) as prescribed, but c/o of worsening productive cough (green phelgm). Pt denies orthopnea, states feels best when laying flat, cannot sleep on side b/c of coughing/chest tightness/discomfort.  Still smoking, 1ppd for at least 40 years, still smoking currently.  Denies hemoptysis/syncope. +doe w/ minimal exertion.  Hx of heavy etoh, but has stopped last 1 week.  Was in AA mtgs and etoh rehab in past for Alcoholism, not currently going to AA mtgs.   Patient has No headache, No chest pain, No abdominal pain - No Nausea, No new weakness tingling or numbness. No pleuritic pain. No Known Allergies Past Medical History  Diagnosis Date  . Alcoholic (HCC)    Current Outpatient Prescriptions on File Prior to Visit  Medication Sig Dispense Refill  . acetaminophen (TYLENOL) 500 MG tablet Take 1,000 mg by mouth every 6 (six) hours as needed for moderate pain.    . Triprolidine-Pseudoephedrine (ANTIHISTAMINE DECONGESTANT PO) Take 1 tablet by mouth daily as needed (for allergies).    Marland Kitchen amoxicillin-clavulanate (AUGMENTIN) 875-125 MG tablet Take 1 tablet by mouth every 12 (twelve) hours. (Patient not taking: Reported on 05/31/2015) 6 tablet 0   No current facility-administered medications on file prior to visit.   Family History  Problem Relation Age of Onset  . Heart attack Mother   . Cirrhosis Father    Social History   Social History  . Marital Status: Single    Spouse Name: N/A  . Number of Children: N/A  . Years of Education: N/A   Occupational History  . Not on file.   Social History Main Topics    . Smoking status: Current Every Day Smoker -- 0.50 packs/day  . Smokeless tobacco: Not on file  . Alcohol Use: Yes     Comment: daily  . Drug Use: No  . Sexual Activity: Not on file   Other Topics Concern  . Not on file   Social History Narrative    Review of Systems: Constitutional: Negative for fever, chills, diaphoresis, activity change, appetite change and fatigue. HENT: Negative for ear pain, nosebleeds, congestion, facial swelling, rhinorrhea, neck pain, neck stiffness and ear discharge.  Eyes: Negative for pain, discharge, redness, itching and visual disturbance. Respiratory: Negative for choking or stridor; +  cough, productive green pleghm, chest tightness, shortness of breath w/ exertion, wheezing today.  Cardiovascular: Negative for chest pain, palpitations and leg swelling. Gastrointestinal: Negative for abdominal distention, no n/v/d. Genitourinary: Negative for dysuria, urgency, frequency, hematuria, flank pain, decreased urine volume, difficulty urinating and dyspareunia.  Musculoskeletal: Negative for back pain, joint swelling, arthralgia and gait problem. Neurological: Negative for dizziness, tremors, seizures, syncope, facial asymmetry, speech difficulty, weakness, light-headedness, numbness and headaches.  Hematological: Negative for adenopathy. Does not bruise/bleed easily. Psychiatric/Behavioral: Negative for hallucinations, behavioral problems, confusion, dysphoric mood, decreased concentration and agitation.    Objective:   Filed Vitals:   05/31/15 1444  BP: 154/98  Pulse: 97  Temp: 97.3 F (36.3 C)  Resp: 22    Filed Weights   05/31/15 1444  Weight: 123 lb 3.2 oz (55.883 kg)    BP  Readings from Last 3 Encounters:  05/31/15 154/98  05/24/15 118/79  10/29/14 147/93    Physical Exam: Constitutional: Patient appears well-developed and well-nourished. No distress. AAOx3, thin dischelved male, +smells of heavy tob, older than stated age. Gait  normal HENT: Normocephalic, atraumatic, External right and left ear normal. Oropharynx is clear and moist.  Eyes: Conjunctivae and EOM are normal. PERRL, no scleral icterus. Neck: Normal ROM. Neck supple. No JVD.  CVS: RRR, S1/S2 +, no murmurs, no gallops, no carotid bruit.  Pulmonary: prior nebs, diffuse wheezing/tight; post Nebs treatment, some intermittent exp wheezing, long expiratory phase, better airation. Abdominal: Soft. BS +, no distension, tenderness. Musculoskeletal: Normal range of motion. No edema and no tenderness.  LE: bilat/ no c/c/e, pulses 2+ bilateral. Neuro: Alert.  muscle tone coordination. No cranial nerve deficit grossly. Skin: Skin is warm and dry. No rash noted. Not diaphoretic. No erythema. No pallor. Psychiatric: Normal mood and affect. Behavior, judgment, thought content normal.  Lab Results  Component Value Date   WBC 14.2* 05/24/2015   HGB 10.3* 05/24/2015   HCT 30.3* 05/24/2015   MCV 96.5 05/24/2015   PLT 439* 05/24/2015   Lab Results  Component Value Date   CREATININE 0.66 05/24/2015   BUN 6 05/24/2015   NA 132* 05/24/2015   K 3.3* 05/24/2015   CL 95* 05/24/2015   CO2 22 05/24/2015    No results found for: HGBA1C Lipid Panel  No results found for: CHOL, TRIG, HDL, CHOLHDL, VLDL, LDLCALC     No flowsheet data found.  Assessment and plan:   1. 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 - no hx of prior PFTs, but heavy Tob hx and lung exam (long exp phase post nebs). - ipratropium-albuterol (DUONEB) 0.5-2.5 (3) MG/3ML nebulizer solution 3 mL; Take 3 mLs by nebulization once. - CBC with Differential - BASIC METABOLIC PANEL WITH GFR - DG Chest 2 View; Future  - r/o effusion/epyema - symbicort mdi bid - albuterol rescue prn - prednisone taper - levofloxacin 500mg  po qday x 7days.  2. Alcohol use disorder, moderate, dependence (HCC) - no etoh x 1 wk - encouraged etoh cessation -  encouraged going back to AA mgts, pt will consider.  3. Wheezing w/ sob/doe - see #1  4. Tobacco abuse Exacerbating #1. - complete tob cessation recd - trial nicoderm 21mg  daily for now. - tips for tob cessation given.   Return in about 7 days (around 06/07/2015) for next wk/bronchitis/copd f/u/pna.  The patient was given clear instructions to go to ER or return to medical center if symptoms don't improve, worsen or new problems develop. The patient verbalized understanding. The patient was told to call to get lab results if they haven't heard anything in the next week.      Pete Glatterawn T Tewana Bohlen, MD, MBA/MHA [facility] [place], [place] [phone]   05/31/2015, 3:51 PM

## 2015-06-01 ENCOUNTER — Inpatient Hospital Stay (HOSPITAL_COMMUNITY)
Admission: EM | Admit: 2015-06-01 | Discharge: 2015-06-08 | DRG: 163 | Disposition: A | Discharge status: Home / Self Care | Payer: Self-pay | Attending: Thoracic Surgery (Cardiothoracic Vascular Surgery) | Admitting: Thoracic Surgery (Cardiothoracic Vascular Surgery)

## 2015-06-01 ENCOUNTER — Encounter (HOSPITAL_COMMUNITY): Payer: Self-pay

## 2015-06-01 ENCOUNTER — Emergency Department (HOSPITAL_COMMUNITY): Payer: Self-pay

## 2015-06-01 ENCOUNTER — Telehealth: Payer: Self-pay

## 2015-06-01 ENCOUNTER — Inpatient Hospital Stay (HOSPITAL_COMMUNITY): Payer: Self-pay

## 2015-06-01 ENCOUNTER — Telehealth: Payer: Self-pay | Admitting: Internal Medicine

## 2015-06-01 DIAGNOSIS — D649 Anemia, unspecified: Secondary | ICD-10-CM | POA: Diagnosis present

## 2015-06-01 DIAGNOSIS — J440 Chronic obstructive pulmonary disease with acute lower respiratory infection: Secondary | ICD-10-CM | POA: Diagnosis present

## 2015-06-01 DIAGNOSIS — E440 Moderate protein-calorie malnutrition: Secondary | ICD-10-CM | POA: Insufficient documentation

## 2015-06-01 DIAGNOSIS — Y95 Nosocomial condition: Secondary | ICD-10-CM | POA: Diagnosis present

## 2015-06-01 DIAGNOSIS — F17200 Nicotine dependence, unspecified, uncomplicated: Secondary | ICD-10-CM | POA: Diagnosis present

## 2015-06-01 DIAGNOSIS — J942 Hemothorax: Secondary | ICD-10-CM | POA: Diagnosis present

## 2015-06-01 DIAGNOSIS — J869 Pyothorax without fistula: Principal | ICD-10-CM | POA: Insufficient documentation

## 2015-06-01 DIAGNOSIS — J189 Pneumonia, unspecified organism: Secondary | ICD-10-CM | POA: Diagnosis present

## 2015-06-01 DIAGNOSIS — R059 Cough, unspecified: Secondary | ICD-10-CM

## 2015-06-01 DIAGNOSIS — E869 Volume depletion, unspecified: Secondary | ICD-10-CM | POA: Diagnosis present

## 2015-06-01 DIAGNOSIS — Z09 Encounter for follow-up examination after completed treatment for conditions other than malignant neoplasm: Secondary | ICD-10-CM

## 2015-06-01 DIAGNOSIS — E871 Hypo-osmolality and hyponatremia: Secondary | ICD-10-CM | POA: Diagnosis present

## 2015-06-01 DIAGNOSIS — E876 Hypokalemia: Secondary | ICD-10-CM

## 2015-06-01 DIAGNOSIS — R05 Cough: Secondary | ICD-10-CM

## 2015-06-01 DIAGNOSIS — J948 Other specified pleural conditions: Secondary | ICD-10-CM | POA: Diagnosis present

## 2015-06-01 DIAGNOSIS — F1020 Alcohol dependence, uncomplicated: Secondary | ICD-10-CM | POA: Diagnosis present

## 2015-06-01 HISTORY — DX: Tobacco use: Z72.0

## 2015-06-01 LAB — CBC WITH DIFFERENTIAL/PLATELET
BASOS ABS: 0 {cells}/uL (ref 0–200)
Basophils Relative: 0 %
EOS PCT: 0 %
Eosinophils Absolute: 0 cells/uL — ABNORMAL LOW (ref 15–500)
HCT: 32.8 % — ABNORMAL LOW (ref 38.5–50.0)
Hemoglobin: 10.7 g/dL — ABNORMAL LOW (ref 13.2–17.1)
Lymphocytes Relative: 11 %
Lymphs Abs: 2090 cells/uL (ref 850–3900)
MCH: 32.6 pg (ref 27.0–33.0)
MCHC: 32.6 g/dL (ref 32.0–36.0)
MCV: 100 fL (ref 80.0–100.0)
MONOS PCT: 5 %
MPV: 10.8 fL (ref 7.5–12.5)
Monocytes Absolute: 950 cells/uL (ref 200–950)
NEUTROS ABS: 15960 {cells}/uL — AB (ref 1500–7800)
NEUTROS PCT: 84 %
PLATELETS: 817 10*3/uL — AB (ref 140–400)
RBC: 3.28 MIL/uL — ABNORMAL LOW (ref 4.20–5.80)
RDW: 13.5 % (ref 11.0–15.0)
WBC: 19 10*3/uL — ABNORMAL HIGH (ref 3.8–10.8)

## 2015-06-01 LAB — CBC
HEMATOCRIT: 29.2 % — AB (ref 39.0–52.0)
Hemoglobin: 9.8 g/dL — ABNORMAL LOW (ref 13.0–17.0)
MCH: 32.7 pg (ref 26.0–34.0)
MCHC: 33.6 g/dL (ref 30.0–36.0)
MCV: 97.3 fL (ref 78.0–100.0)
PLATELETS: 745 10*3/uL — AB (ref 150–400)
RBC: 3 MIL/uL — ABNORMAL LOW (ref 4.22–5.81)
RDW: 12.8 % (ref 11.5–15.5)
WBC: 15.6 10*3/uL — ABNORMAL HIGH (ref 4.0–10.5)

## 2015-06-01 LAB — BASIC METABOLIC PANEL
Anion gap: 10 (ref 5–15)
BUN: 10 mg/dL (ref 6–20)
CHLORIDE: 99 mmol/L — AB (ref 101–111)
CO2: 24 mmol/L (ref 22–32)
CREATININE: 0.66 mg/dL (ref 0.61–1.24)
Calcium: 8.8 mg/dL — ABNORMAL LOW (ref 8.9–10.3)
GFR calc Af Amer: >60 mL/min (ref >60)
GFR calc non Af Amer: >60 mL/min (ref >60)
GLUCOSE: 155 mg/dL — AB (ref 65–99)
POTASSIUM: 3.6 mmol/L (ref 3.5–5.1)
SODIUM: 133 mmol/L — AB (ref 135–145)

## 2015-06-01 LAB — HEPATIC FUNCTION PANEL
ALBUMIN: 1.9 g/dL — AB (ref 3.5–5.0)
ALK PHOS: 117 U/L (ref 38–126)
ALT: 33 U/L (ref 17–63)
AST: 40 U/L (ref 15–41)
BILIRUBIN TOTAL: 0.5 mg/dL (ref 0.3–1.2)
Bilirubin, Direct: 0.1 mg/dL (ref 0.1–0.5)
Indirect Bilirubin: 0.4 mg/dL (ref 0.3–0.9)
TOTAL PROTEIN: 6.2 g/dL — AB (ref 6.5–8.1)

## 2015-06-01 LAB — I-STAT CG4 LACTIC ACID, ED
Lactic Acid, Venous: 1.42 mmol/L (ref 0.5–2.0)
Lactic Acid, Venous: 0.83 mmol/L (ref 0.5–2.0)

## 2015-06-01 LAB — PROTIME-INR
INR: 1.14 (ref 0.00–1.49)
Prothrombin Time: 14.8 seconds (ref 11.6–15.2)

## 2015-06-01 MED ORDER — GUAIFENESIN ER 600 MG PO TB12
600.0000 mg | ORAL_TABLET | Freq: Two times a day (BID) | ORAL | Status: DC
  Administered 2015-06-01 – 2015-06-08 (×12): 600 mg via ORAL
  Filled 2015-06-01 (×12): qty 1

## 2015-06-01 MED ORDER — PIPERACILLIN-TAZOBACTAM 3.375 G IVPB 30 MIN
3.3750 g | Freq: Once | INTRAVENOUS | Status: AC
  Administered 2015-06-01: 3.375 g via INTRAVENOUS
  Filled 2015-06-01: qty 50

## 2015-06-01 MED ORDER — ALBUTEROL SULFATE (2.5 MG/3ML) 0.083% IN NEBU: 2.5000 mg | INHALATION_SOLUTION | Freq: Four times a day (QID) | RESPIRATORY_TRACT | Status: DC | PRN

## 2015-06-01 MED ORDER — VANCOMYCIN HCL IN DEXTROSE 1-5 GM/200ML-% IV SOLN
1000.0000 mg | Freq: Once | INTRAVENOUS | Status: AC
  Administered 2015-06-01: 1000 mg via INTRAVENOUS
  Filled 2015-06-01: qty 200

## 2015-06-01 MED ORDER — SODIUM CHLORIDE 0.9 % IV SOLN
INTRAVENOUS | Status: DC
  Administered 2015-06-01 – 2015-06-02 (×2): via INTRAVENOUS

## 2015-06-01 MED ORDER — MOMETASONE FURO-FORMOTEROL FUM 100-5 MCG/ACT IN AERO
2.0000 | INHALATION_SPRAY | Freq: Two times a day (BID) | RESPIRATORY_TRACT | Status: DC
  Administered 2015-06-02 – 2015-06-08 (×7): 2 via RESPIRATORY_TRACT
  Filled 2015-06-01 (×3): qty 8.8

## 2015-06-01 MED ORDER — IOPAMIDOL (ISOVUE-300) INJECTION 61%
INTRAVENOUS | Status: AC
  Administered 2015-06-01: 75 mL
  Filled 2015-06-01: qty 75

## 2015-06-01 MED ORDER — PIPERACILLIN-TAZOBACTAM 3.375 G IVPB
3.3750 g | Freq: Three times a day (TID) | INTRAVENOUS | Status: DC
  Administered 2015-06-02: 3.375 g via INTRAVENOUS
  Filled 2015-06-01 (×3): qty 50

## 2015-06-01 MED ORDER — NICOTINE 21 MG/24HR TD PT24
21.0000 mg | MEDICATED_PATCH | Freq: Every day | TRANSDERMAL | Status: DC
  Administered 2015-06-01 – 2015-06-08 (×7): 21 mg via TRANSDERMAL
  Filled 2015-06-01 (×7): qty 1

## 2015-06-01 MED ORDER — SODIUM CHLORIDE 0.9 % IV SOLN
500.0000 mg | Freq: Three times a day (TID) | INTRAVENOUS | Status: DC
  Administered 2015-06-02 – 2015-06-04 (×6): 500 mg via INTRAVENOUS
  Filled 2015-06-01 (×9): qty 500

## 2015-06-01 MED ORDER — BENZONATATE 100 MG PO CAPS
100.0000 mg | ORAL_CAPSULE | Freq: Three times a day (TID) | ORAL | Status: DC | PRN
Start: 2015-06-01 — End: 2015-06-08
  Administered 2015-06-02 (×2): 100 mg via ORAL
  Filled 2015-06-01 (×2): qty 1

## 2015-06-01 MED ORDER — SODIUM CHLORIDE 0.9 % IV BOLUS (SEPSIS)
1000.0000 mL | Freq: Once | INTRAVENOUS | Status: AC
  Administered 2015-06-01: 1000 mL via INTRAVENOUS

## 2015-06-01 NOTE — Telephone Encounter (Signed)
Erskine SquibbJane from the Radiology department called requesting to speak to pt. PCP about  The x-ray that pt. Had done. Per PCP she knows the results of the pt. And talked to  The ER about the pt. Erskine SquibbJane was giving the information that pt. PCP had given me.
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
## 2015-06-01 NOTE — Telephone Encounter (Signed)
Clld pt - Received permission as well to speak to his brother, Brent Warren - advsd of chest xray results and urgent [facility] fluid on his right lung. Pt and brother stated they understood and would be heading to the hospital shortly.

## 2015-06-01 NOTE — Telephone Encounter (Signed)
Talked to Pt's brother [name] about pt's illness. Instructed pt to remain NPO and take pt now to ER ASAP.  dw Dr Katrine CohoMerril, hospitalist, bed shortage, so rather than direct admit, will send to ED.  dw Dr Fayrene FearingJames, ED MD, abt pt and concerns for empyema/sepsis.  They are aware of his arrival.
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
## 2015-06-01 NOTE — Progress Notes (Signed)
Pharmacy Antibiotic Note  Brent Warren is a 55 y.o. male admitted on 06/01/2015 with pneumonia.  Pharmacy has been consulted for vancomycin/Zosyn dosing.  Diagnosed with CAP prior to admission, finished course Augmentin and prescribed levofloxacin started yesterday. WBC 15.6, Tmax 98.66F. Cr 0.66, CrCl ~80 min/min.  Plan: Vancomycin 1000 mg IV x1 loading dose Vancomycin 500 IV every 8 hours.  Goal trough 15-20 mcg/mL.  Zosyn 3.375 g IV q8h   Height: 5\' 9"  (175.3 cm) Weight: 122 lb (55.339 kg) IBW/kg (Calculated) : 70.7  Temp (24hrs), Avg:98.3 F (36.8 C), Min:98.2 F (36.8 C), Max:98.4 F (36.9 C)   Recent Labs Lab 05/31/15 1538 06/01/15 1454  WBC 19.0* 15.6*  CREATININE 0.51* 0.66    Estimated Creatinine Clearance: 81.6 mL/min (by C-G formula based on Cr of 0.66).    No Known Allergies  Antimicrobials this admission: 5/24 vancomycin >>  5/24 Zosyn >>   Dose adjustments this admission: n/a  Microbiology results: 5/24 BCx:   Thank you for allowing pharmacy to be a part of this patient's care.  Brent Warren 06/01/2015 6:10 PM

## 2015-06-01 NOTE — ED Notes (Signed)
Pt. Is here for admission. He stated,  "Im here to have fluid drained from my rt. Lung."     Pt. Is talking in full sentences.  He does have cough and sob with exertion.    Skin is warm and dry.  Pt. Has pain all over.  GCS 15.

## 2015-06-01 NOTE — Progress Notes (Signed)
Called ER RN for report. Will call back.  

## 2015-06-01 NOTE — ED Notes (Signed)
Pt diagnosed with pneumonia approx one week ago /  He is actually getting better but he was sent here for admission  He is hungry unhappy about not eating yet

## 2015-06-02 ENCOUNTER — Encounter (HOSPITAL_COMMUNITY): Payer: Self-pay | Admitting: Thoracic Surgery (Cardiothoracic Vascular Surgery)

## 2015-06-02 DIAGNOSIS — F1020 Alcohol dependence, uncomplicated: Secondary | ICD-10-CM

## 2015-06-02 DIAGNOSIS — J948 Other specified pleural conditions: Secondary | ICD-10-CM

## 2015-06-02 DIAGNOSIS — E871 Hypo-osmolality and hyponatremia: Secondary | ICD-10-CM

## 2015-06-02 DIAGNOSIS — E440 Moderate protein-calorie malnutrition: Secondary | ICD-10-CM | POA: Insufficient documentation

## 2015-06-02 DIAGNOSIS — E876 Hypokalemia: Secondary | ICD-10-CM

## 2015-06-02 DIAGNOSIS — J869 Pyothorax without fistula: Secondary | ICD-10-CM

## 2015-06-02 HISTORY — DX: Moderate protein-calorie malnutrition: E44.0

## 2015-06-02 LAB — URINALYSIS, ROUTINE W REFLEX MICROSCOPIC
Bilirubin Urine: NEGATIVE
Glucose, UA: NEGATIVE mg/dL
HGB URINE DIPSTICK: NEGATIVE
Ketones, ur: NEGATIVE mg/dL
Leukocytes, UA: NEGATIVE
Nitrite: NEGATIVE
PH: 7.5 (ref 5.0–8.0)
Protein, ur: NEGATIVE mg/dL
SPECIFIC GRAVITY, URINE: 1.01 (ref 1.005–1.030)

## 2015-06-02 LAB — CBC
HCT: 27.8 % — ABNORMAL LOW (ref 39.0–52.0)
HCT: 27.4 % — ABNORMAL LOW (ref 39.0–52.0)
HEMOGLOBIN: 8.9 g/dL — AB (ref 13.0–17.0)
HEMOGLOBIN: 9 g/dL — AB (ref 13.0–17.0)
MCH: 32.2 pg (ref 26.0–34.0)
MCH: 32.1 pg (ref 26.0–34.0)
MCHC: 32 g/dL (ref 30.0–36.0)
MCHC: 32.8 g/dL (ref 30.0–36.0)
MCV: 100.7 fL — AB (ref 78.0–100.0)
MCV: 97.9 fL (ref 78.0–100.0)
Platelets: 681 10*3/uL — ABNORMAL HIGH (ref 150–400)
Platelets: 694 10*3/uL — ABNORMAL HIGH (ref 150–400)
RBC: 2.76 MIL/uL — AB (ref 4.22–5.81)
RBC: 2.8 MIL/uL — AB (ref 4.22–5.81)
RDW: 13.2 % (ref 11.5–15.5)
RDW: 13 % (ref 11.5–15.5)
WBC: 14.3 10*3/uL — ABNORMAL HIGH (ref 4.0–10.5)
WBC: 12.8 10*3/uL — ABNORMAL HIGH (ref 4.0–10.5)

## 2015-06-02 LAB — BASIC METABOLIC PANEL
Anion gap: 8 (ref 5–15)
BUN: 7 mg/dL (ref 6–20)
CHLORIDE: 104 mmol/L (ref 101–111)
CO2: 24 mmol/L (ref 22–32)
Calcium: 8.1 mg/dL — ABNORMAL LOW (ref 8.9–10.3)
Creatinine, Ser: 0.63 mg/dL (ref 0.61–1.24)
GFR calc Af Amer: >60 mL/min (ref >60)
GFR calc non Af Amer: >60 mL/min (ref >60)
GLUCOSE: 95 mg/dL (ref 65–99)
POTASSIUM: 2.9 mmol/L — AB (ref 3.5–5.1)
Sodium: 136 mmol/L (ref 135–145)

## 2015-06-02 LAB — BLOOD GAS, ARTERIAL
ACID-BASE DEFICIT: 1.3 mmol/L (ref 0.0–2.0)
BICARBONATE: 21.9 meq/L (ref 20.0–24.0)
DRAWN BY: 365291
FIO2: 0.21
O2 Saturation: 96.2 %
PATIENT TEMPERATURE: 98.6
PH ART: 7.468 — AB (ref 7.350–7.450)
TCO2: 22.8 mmol/L (ref 0–100)
pCO2 arterial: 30.6 mmHg — ABNORMAL LOW (ref 35.0–45.0)
pO2, Arterial: 85.3 mmHg (ref 80.0–100.0)

## 2015-06-02 LAB — COMPREHENSIVE METABOLIC PANEL
ALT: 39 U/L (ref 17–63)
ANION GAP: 8 (ref 5–15)
AST: 55 U/L — ABNORMAL HIGH (ref 15–41)
Albumin: 1.7 g/dL — ABNORMAL LOW (ref 3.5–5.0)
Alkaline Phosphatase: 108 U/L (ref 38–126)
BILIRUBIN TOTAL: 0.4 mg/dL (ref 0.3–1.2)
BUN: 8 mg/dL (ref 6–20)
CALCIUM: 8.2 mg/dL — AB (ref 8.9–10.3)
CO2: 24 mmol/L (ref 22–32)
Chloride: 104 mmol/L (ref 101–111)
Creatinine, Ser: 0.68 mg/dL (ref 0.61–1.24)
Glucose, Bld: 105 mg/dL — ABNORMAL HIGH (ref 65–99)
Potassium: 3.4 mmol/L — ABNORMAL LOW (ref 3.5–5.1)
Sodium: 136 mmol/L (ref 135–145)
TOTAL PROTEIN: 6 g/dL — AB (ref 6.5–8.1)

## 2015-06-02 LAB — ABO/RH: ABO/RH(D): A POS

## 2015-06-02 LAB — HIV ANTIBODY (ROUTINE TESTING W REFLEX): HIV Screen 4th Generation wRfx: NONREACTIVE

## 2015-06-02 LAB — APTT: aPTT: 31 seconds (ref 24–37)

## 2015-06-02 LAB — PROTIME-INR
INR: 1.08 (ref 0.00–1.49)
Prothrombin Time: 14.2 seconds (ref 11.6–15.2)

## 2015-06-02 MED ORDER — CEFEPIME HCL 2 G IJ SOLR
2.0000 g | Freq: Three times a day (TID) | INTRAMUSCULAR | Status: DC
  Administered 2015-06-02 – 2015-06-08 (×18): 2 g via INTRAVENOUS
  Filled 2015-06-02 (×21): qty 2

## 2015-06-02 MED ORDER — POTASSIUM CHLORIDE CRYS ER 20 MEQ PO TBCR
40.0000 meq | EXTENDED_RELEASE_TABLET | Freq: Four times a day (QID) | ORAL | Status: AC
  Administered 2015-06-02 (×2): 40 meq via ORAL
  Filled 2015-06-02 (×2): qty 2

## 2015-06-02 MED ORDER — ENSURE ENLIVE PO LIQD
237.0000 mL | Freq: Two times a day (BID) | ORAL | Status: DC
  Administered 2015-06-02 – 2015-06-08 (×6): 237 mL via ORAL
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
## 2015-06-02 NOTE — Care Management (Signed)
UR complete [facility] [place] , Kiam Bransfield RN MPH,Case manager
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
## 2015-06-02 NOTE — Progress Notes (Signed)
PROGRESS NOTE  Brent Warren UJW:[id number] DOB: [birth year] DOA: 06/01/2015 PCP: Pete Glatter, MD  Brief History:  55 year old male with a history of alcohol dependence presented with increasing cough and pleuritic chest pain. The patient was discharged from the hospital on 05/24/2015 after treatment for sepsis secondary to pneumonia. At that time, there was concern whether his pneumonia was aspiration versus development secondary to hypoventilation. The patient was discharged with Augmentin to finish on 05/27/2015. He endorsed compliance. The patient continued to have cough and pleuritic chest pain with dyspnea on exertion. [facility]. It was thought that he had acute bronchitis versus exacerbation of COPD, and was started on levofloxacin.  The patient took one dose of levofloxacin.   Outpatient chest x-ray revealed a loculated hydropneumothorax on the right. As result, the patient was injected to follow up in the emergency department. Outpatient CBC revealed a WBC 19.0.  In the emergency department, the patient was afebrile and hemodynamically stable. Blood cultures were obtained after which the patient was started on vancomycin and Zosyn. Cardiothoracic surgery was consulted after CT of the chest confirmed right-sided loculated hydropneumothorax concerning for empyema.  VATS is planned.  Assessment/Plan: Hydropneumothorax -Concern for underlying empyema -Continue vancomycin 5/24>>> -d/c zosyn -start cefepime 5/25>>> -appreciate Dr. Rolland Bimler -lactate 1.42  Tobacco abuse -Tobacco cessation discussed  Alcohol dependence -last drink 10 days prior to admission  Hypokalemia -replete -check mag  COPD -stable on RA -continue LABA  Hyponatremia -Secondary to volume depletion -Improved with normal saline   Disposition Plan:   Not stable for dc, plan for VATS 5/26 Family Communication:   No Family at  beside  Consultants:  TCTS--[place]  Code Status:  FULL    Subjective: Patient complains of right-sided chest pain. He is breathing better than yesterday. Denies any nausea, vomiting, diarrhea, abdominal pain. No dysuria or hematuria. Denies any headache or neck pain.  Objective: Filed Vitals:   06/01/15 2045 06/01/15 2200 06/02/15 0509 06/02/15 0816  BP: 156/87 141/82 144/76   Pulse: 74 66 82 80  Temp:  98.1 F (36.7 C) 98.4 F (36.9 C)   TempSrc:  Oral Oral   Resp: Height:   (1.753 m)    Weight:  56 kg (123 lb 7.3 oz)    SpO2: 97% 98% 97% 98%    Intake/Output Summary (Last 24 hours) at 06/02/15 0935 Last data filed at 06/02/15 5621  Gross per 24 hour  Intake 1046.67 ml  Output    450 ml  Net 596.67 ml   Weight change:  Exam:   General:  Pt is alert, follows commands appropriately, not in acute distress  HEENT: No icterus, No thrush, No neck mass, [place]/AT  Cardiovascular: RRR, S1/S2, no rubs, no gallops  Respiratory: Bibasilar rales. Diminished breath sounds right base. No wheezing.  Abdomen: Soft/+BS, non tender, non distended, no guarding  Extremities: No edema, No lymphangitis, No petechiae, No rashes, no synovitis   Data Reviewed: I have personally reviewed following labs and imaging studies Basic Metabolic Panel:  Recent Labs Lab 05/31/15 1538 06/01/15 1454 06/02/15 0559  NA 135 133* 136  K 4.2 3.6 2.9*  CL 96* 99* 104  CO2 GLUCOSE 112* 155* 95  BUN CREATININE 0.51* 0.66 0.63  CALCIUM 8.9 8.8* 8.1*   Liver Function Tests:  Recent Labs Lab 06/01/15 2235  AST 40  ALT 33  ALKPHOS 117  BILITOT 0.5  PROT 6.2*  ALBUMIN 1.9*   No results for input(s): LIPASE, AMYLASE in the last 168 hours. No results for input(s): AMMONIA in the last 168 hours. Coagulation Profile:  Recent Labs Lab 06/01/15 2235  INR 1.14   CBC:  Recent Labs Lab 05/31/15 1538 06/01/15 1454 06/02/15 0559  WBC 19.0*  15.6* 14.3*  NEUTROABS 15960*  --   --   HGB 10.7* 9.8* 8.9*  HCT 32.8* 29.2* 27.8*  MCV 100.0 97.3 100.7*  PLT 817* 745* 681*   Cardiac Enzymes: No results for input(s): CKTOTAL, CKMB, CKMBINDEX, TROPONINI in the last 168 hours. BNP: Invalid input(s): POCBNP CBG: No results for input(s): GLUCAP in the last 168 hours. HbA1C: No results for input(s): HGBA1C in the last 72 hours. Urine analysis:    Component Value Date/Time   COLORURINE AMBER* 05/22/2015 2040   APPEARANCEUR CLEAR 05/22/2015 2040   LABSPEC 1.011 05/22/2015 2040   PHURINE 6.0 05/22/2015 2040   GLUCOSEU NEGATIVE 05/22/2015 2040   HGBUR NEGATIVE 05/22/2015 2040   BILIRUBINUR SMALL* 05/22/2015 2040   KETONESUR 15* 05/22/2015 2040   PROTEINUR NEGATIVE 05/22/2015 2040   UROBILINOGEN 1.0 10/29/2014 0418   NITRITE NEGATIVE 05/22/2015 2040   LEUKOCYTESUR NEGATIVE 05/22/2015 2040   Sepsis Labs: @LABRCNTIP (procalcitonin:4,lacticidven:4) )No results found for this or any previous visit (from the past 240 hour(s)).   Scheduled Meds: . guaiFENesin  600 mg Oral BID  . mometasone-formoterol  2 puff Inhalation BID  . nicotine  21 mg Transdermal Daily  . piperacillin-tazobactam (ZOSYN)  IV  3.375 g Intravenous Q8H  . vancomycin  500 mg Intravenous Q8H   Continuous Infusions: . sodium chloride 100 mL/hr at 06/02/15 0248    Procedures/Studies: Dg Chest 2 View  06/01/2015  CLINICAL DATA:  55 year old male with cough and shortness of breath for 1 week. Subsequent encounter. EXAM: CHEST  2 VIEW COMPARISON:  05/31/2015 and earlier, including CT Abdomen and Pelvis 10/29/2014. FINDINGS: Continued loculated appearing right lower lobe hydro pneumothorax, unchanged since yesterday. No new areas of right lung opacity. The left lung remain stable in clear. Mediastinal contours remain within normal limits. Visualized tracheal air column is within normal limits. No acute osseous abnormality identified. IMPRESSION: Unchanged radiographic  appearance of loculated right lower lobe hydropneumothorax since yesterday. This is a new process since 10/29/2014, therefore lung base infection with necrosis or cavitation is the main differential consideration. Electronically Signed   By: Odessa FlemingH  Hall M.D.   On: 06/01/2015 15:51   Dg Chest 2 View  06/01/2015  CLINICAL DATA:  Cough, fever.  Possible pneumonia. EXAM: CHEST  2 VIEW COMPARISON:  05/22/2015 FINDINGS: Normal heart size. Loculated right sided scratch set there is a right-sided, loculated hydro pneumothorax identified. This is a new finding when compared with previous exam. The left lung appears clear. The visualized osseous structures are unremarkable. IMPRESSION: 1. New, loculated hydro pneumothorax is identified on the right. In the setting of suspected pneumonia underlying empyema is not excluded. Electronically Signed   By: Signa Kellaylor  Stroud M.D.   On: 06/01/2015 08:22   Dg Chest 2 View  05/22/2015  CLINICAL DATA:  55 year old male with pleural effusion and cough and chest pain. EXAM: CHEST  2 VIEW COMPARISON:  Chest radiograph dated 05/22/2015 FINDINGS: There is a small right pleural effusion. Right lung base increased opacity may represent atelectatic changes versus infiltrate. The left lung is clear. There is no pneumothorax. The cardiac silhouette is within normal  limits. No acute osseous pathology. IMPRESSION: Small right pleural effusion with right lung base atelectasis/infiltrate, slightly increased compared to prior study. Electronically Signed   By: Elgie Collard M.D.   On: 05/22/2015 22:39   Ct Chest W Contrast  06/01/2015  CLINICAL DATA:  Cough and shortness of breath. Acquired pneumonia. Follow-up hydro pneumothorax on the right. EXAM: CT CHEST WITH CONTRAST TECHNIQUE: Multidetector CT imaging of the chest was performed during intravenous contrast administration. CONTRAST:  1 ISOVUE-300 IOPAMIDOL (ISOVUE-300) INJECTION 61% COMPARISON:  Chest radiography same day and multiple previous  FINDINGS: Background pattern of centrilobular emphysema and paraseptal emphysema. On the left, there are focal densities posteriorly at the base which appear somewhat nodular. These could be areas of infiltrate or atelectasis, but masses are not excluded and follow-up of these is suggested tissue resolution. The largest laterally measures approximately 19 mm. On the right, there is a peripheral E loculated pleural fluid and air collection posterior laterally consistent with empyema. There is compressive atelectasis and or infiltrate in the adjacent lung, primarily the right lower lobe but also to some extent the right middle lobe. Mild hilar nodal prominence, likely reactive. No paratracheal adenopathy. Scans in the upper abdomen are unremarkable. No bony abnormality. IMPRESSION: Loculated empyema in the right posterior lateral pleural space. Volume loss and/or pneumonia in the right lower lobe and to a minimal extent the right middle lobe. Empyema measures approximately 12 x 6 by 12 cm. Patchy nodular densities at the left lung base that probably represent pneumonia. Follow-up of these is recommended to assure resolution however. Electronically Signed   By: Paulina Fusi M.D.   On: 06/01/2015 21:41   Dg Chest Portable 1 View  05/22/2015  CLINICAL DATA:  Chest pain; altered mental status EXAM: PORTABLE CHEST 1 VIEW COMPARISON:  08/04/2013 heart is normal in size. There is dense consolidation at the right lung base which obscures the hemidiaphragm. Right pleural effusion is associated. Left lung is clear. FINDINGS: The heart size and mediastinal contours are within normal limits. Both lungs are clear. The visualized skeletal structures are unremarkable. IMPRESSION: Right lower lobe infiltrate and pleural effusion. Followup PA and lateral chest X-ray is recommended in 3-4 weeks following trial of antibiotic therapy to ensure resolution and exclude underlying malignancy. Electronically Signed   By: Norva Pavlov  M.D.   On: 05/22/2015 18:02    Avaree Gilberti, DO  [facility] Pager [phone]  If 7PM-7AM, please contact night-coverage [email or web address] Password [email or web address] 06/02/2015, 9:35 AM   LOS: 1 day

## 2015-06-03 ENCOUNTER — Encounter (HOSPITAL_COMMUNITY)
Admission: EM | Disposition: A | Discharge status: Home / Self Care | Payer: Self-pay | Source: Home / Self Care | Attending: Thoracic Surgery (Cardiothoracic Vascular Surgery)

## 2015-06-03 ENCOUNTER — Inpatient Hospital Stay (HOSPITAL_COMMUNITY): Payer: Self-pay | Admitting: Anesthesiology

## 2015-06-03 ENCOUNTER — Inpatient Hospital Stay (HOSPITAL_COMMUNITY): Payer: Self-pay

## 2015-06-03 ENCOUNTER — Inpatient Hospital Stay (HOSPITAL_COMMUNITY): Payer: MEDICAID | Admitting: Anesthesiology

## 2015-06-03 DIAGNOSIS — J869 Pyothorax without fistula: Secondary | ICD-10-CM | POA: Insufficient documentation

## 2015-06-03 HISTORY — PX: DECORTICATION: SHX5101

## 2015-06-03 HISTORY — PX: VIDEO ASSISTED THORACOSCOPY (VATS)/EMPYEMA: SHX6172

## 2015-06-03 LAB — CBC
HCT: 30.8 % — ABNORMAL LOW (ref 39.0–52.0)
HEMOGLOBIN: 10 g/dL — AB (ref 13.0–17.0)
MCH: 31.9 pg (ref 26.0–34.0)
MCHC: 32.5 g/dL (ref 30.0–36.0)
MCV: 98.4 fL (ref 78.0–100.0)
PLATELETS: 778 10*3/uL — AB (ref 150–400)
RBC: 3.13 MIL/uL — ABNORMAL LOW (ref 4.22–5.81)
RDW: 13.1 % (ref 11.5–15.5)
WBC: 14.1 10*3/uL — ABNORMAL HIGH (ref 4.0–10.5)

## 2015-06-03 LAB — POCT I-STAT 7, (LYTES, BLD GAS, ICA,H+H)
ACID-BASE DEFICIT: 2 mmol/L (ref 0.0–2.0)
ACID-BASE DEFICIT: 2 mmol/L (ref 0.0–2.0)
Bicarbonate: 25.1 mEq/L — ABNORMAL HIGH (ref 20.0–24.0)
Bicarbonate: 25.2 mEq/L — ABNORMAL HIGH (ref 20.0–24.0)
Calcium, Ion: 1.25 mmol/L — ABNORMAL HIGH (ref 1.12–1.23)
Calcium, Ion: 1.25 mmol/L — ABNORMAL HIGH (ref 1.12–1.23)
HCT: 30 % — ABNORMAL LOW (ref 39.0–52.0)
HEMATOCRIT: 31 % — AB (ref 39.0–52.0)
HEMOGLOBIN: 10.5 g/dL — AB (ref 13.0–17.0)
Hemoglobin: 10.2 g/dL — ABNORMAL LOW (ref 13.0–17.0)
O2 SAT: 94 %
O2 SAT: 87 %
PH ART: 7.297 — AB (ref 7.350–7.450)
PH ART: 7.295 — AB (ref 7.350–7.450)
PO2 ART: 76 mmHg — AB (ref 80.0–100.0)
PO2 ART: 54 mmHg — AB (ref 80.0–100.0)
POTASSIUM: 4.3 mmol/L (ref 3.5–5.1)
Patient temperature: 35.9
Potassium: 3.7 mmol/L (ref 3.5–5.1)
SODIUM: 139 mmol/L (ref 135–145)
Sodium: 140 mmol/L (ref 135–145)
TCO2: 27 mmol/L (ref 0–100)
TCO2: 27 mmol/L (ref 0–100)
pCO2 arterial: 50.7 mmHg — ABNORMAL HIGH (ref 35.0–45.0)
pCO2 arterial: 50.6 mmHg — ABNORMAL HIGH (ref 35.0–45.0)

## 2015-06-03 LAB — BASIC METABOLIC PANEL
Anion gap: 10 (ref 5–15)
BUN: 7 mg/dL (ref 6–20)
CALCIUM: 8.9 mg/dL (ref 8.9–10.3)
CHLORIDE: 102 mmol/L (ref 101–111)
CO2: 24 mmol/L (ref 22–32)
Creatinine, Ser: 0.67 mg/dL (ref 0.61–1.24)
Glucose, Bld: 103 mg/dL — ABNORMAL HIGH (ref 65–99)
POTASSIUM: 4 mmol/L (ref 3.5–5.1)
SODIUM: 136 mmol/L (ref 135–145)

## 2015-06-03 LAB — GRAM STAIN

## 2015-06-03 LAB — SURGICAL PCR SCREEN
MRSA, PCR: NEGATIVE
Staphylococcus aureus: NEGATIVE

## 2015-06-03 LAB — PREPARE RBC (CROSSMATCH)

## 2015-06-03 SURGERY — VIDEO ASSISTED THORACOSCOPY (VATS)/EMPYEMA
Anesthesia: General | Site: Chest | Laterality: Right

## 2015-06-03 MED ORDER — POTASSIUM CHLORIDE 10 MEQ/50ML IV SOLN: 10.0000 meq | Freq: Every day | INTRAVENOUS | Status: DC | PRN

## 2015-06-03 MED ORDER — ACETAMINOPHEN 160 MG/5ML PO SOLN
325.0000 mg | ORAL | Status: DC | PRN
  Filled 2015-06-03: qty 20.3

## 2015-06-03 MED ORDER — PROPOFOL 10 MG/ML IV BOLUS
INTRAVENOUS | Status: DC | PRN
  Administered 2015-06-03: 100 mg via INTRAVENOUS
  Administered 2015-06-03: 40 mg via INTRAVENOUS

## 2015-06-03 MED ORDER — ONDANSETRON HCL 4 MG/2ML IJ SOLN: 4.0000 mg | Freq: Four times a day (QID) | INTRAMUSCULAR | Status: DC | PRN

## 2015-06-03 MED ORDER — FENTANYL CITRATE (PF) 250 MCG/5ML IJ SOLN
INTRAMUSCULAR | Status: AC
  Filled 2015-06-03: qty 5

## 2015-06-03 MED ORDER — VITAMIN B-1 100 MG PO TABS
100.0000 mg | ORAL_TABLET | Freq: Every day | ORAL | Status: DC
  Administered 2015-06-04 – 2015-06-08 (×5): 100 mg via ORAL
  Filled 2015-06-03 (×5): qty 1

## 2015-06-03 MED ORDER — BISACODYL 5 MG PO TBEC
10.0000 mg | DELAYED_RELEASE_TABLET | Freq: Every day | ORAL | Status: DC
  Administered 2015-06-03 – 2015-06-08 (×5): 10 mg via ORAL
  Filled 2015-06-03 (×5): qty 2

## 2015-06-03 MED ORDER — NALOXONE HCL 0.4 MG/ML IJ SOLN: 0.4000 mg | INTRAMUSCULAR | Status: DC | PRN

## 2015-06-03 MED ORDER — LEVALBUTEROL HCL 0.63 MG/3ML IN NEBU
0.6300 mg | INHALATION_SOLUTION | Freq: Four times a day (QID) | RESPIRATORY_TRACT | Status: DC
  Administered 2015-06-03 (×2): 0.63 mg via RESPIRATORY_TRACT
  Filled 2015-06-03 (×2): qty 3

## 2015-06-03 MED ORDER — SENNOSIDES-DOCUSATE SODIUM 8.6-50 MG PO TABS
1.0000 | ORAL_TABLET | Freq: Every day | ORAL | Status: DC
  Administered 2015-06-04 – 2015-06-07 (×3): 1 via ORAL
  Filled 2015-06-03 (×3): qty 1

## 2015-06-03 MED ORDER — PHENYLEPHRINE 40 MCG/ML (10ML) SYRINGE FOR IV PUSH (FOR BLOOD PRESSURE SUPPORT)
PREFILLED_SYRINGE | INTRAVENOUS | Status: AC
  Filled 2015-06-03: qty 10

## 2015-06-03 MED ORDER — ROCURONIUM BROMIDE 50 MG/5ML IV SOLN
INTRAVENOUS | Status: AC
  Filled 2015-06-03: qty 1

## 2015-06-03 MED ORDER — SODIUM CHLORIDE 0.9% FLUSH: 9.0000 mL | INTRAVENOUS | Status: DC | PRN

## 2015-06-03 MED ORDER — POTASSIUM CHLORIDE IN NACL 20-0.9 MEQ/L-% IV SOLN
INTRAVENOUS | Status: DC
  Administered 2015-06-03 – 2015-06-05 (×2): via INTRAVENOUS
  Filled 2015-06-03 (×5): qty 1000

## 2015-06-03 MED ORDER — ROCURONIUM BROMIDE 50 MG/5ML IV SOLN
INTRAVENOUS | Status: AC
  Filled 2015-06-03: qty 2

## 2015-06-03 MED ORDER — DIPHENHYDRAMINE HCL 50 MG/ML IJ SOLN: 12.5000 mg | Freq: Four times a day (QID) | INTRAMUSCULAR | Status: DC | PRN

## 2015-06-03 MED ORDER — OXYCODONE HCL 5 MG PO TABS: 5.0000 mg | ORAL_TABLET | Freq: Once | ORAL | Status: DC | PRN

## 2015-06-03 MED ORDER — THIAMINE HCL 100 MG/ML IJ SOLN
100.0000 mg | Freq: Every day | INTRAMUSCULAR | Status: DC
  Administered 2015-06-03: 100 mg via INTRAVENOUS
  Filled 2015-06-03: qty 2

## 2015-06-03 MED ORDER — PHENYLEPHRINE HCL 10 MG/ML IJ SOLN
10.0000 mg | INTRAVENOUS | Status: DC | PRN
  Administered 2015-06-03: 20 ug/min via INTRAVENOUS

## 2015-06-03 MED ORDER — FENTANYL 40 MCG/ML IV SOLN
INTRAVENOUS | Status: AC
  Administered 2015-06-03: 12:00:00
  Filled 2015-06-03: qty 25

## 2015-06-03 MED ORDER — 0.9 % SODIUM CHLORIDE (POUR BTL) OPTIME
TOPICAL | Status: DC | PRN
  Administered 2015-06-03: 2000 mL

## 2015-06-03 MED ORDER — MIDAZOLAM HCL 5 MG/5ML IJ SOLN
INTRAMUSCULAR | Status: DC | PRN
  Administered 2015-06-03: 2 mg via INTRAVENOUS

## 2015-06-03 MED ORDER — ACETAMINOPHEN 325 MG PO TABS: 325.0000 mg | ORAL_TABLET | ORAL | Status: DC | PRN

## 2015-06-03 MED ORDER — TRAMADOL HCL 50 MG PO TABS
50.0000 mg | ORAL_TABLET | Freq: Four times a day (QID) | ORAL | Status: DC | PRN
  Administered 2015-06-07 – 2015-06-08 (×3): 100 mg via ORAL
  Filled 2015-06-03 (×3): qty 2

## 2015-06-03 MED ORDER — ACETAMINOPHEN 500 MG PO TABS
1000.0000 mg | ORAL_TABLET | Freq: Four times a day (QID) | ORAL | Status: DC
  Administered 2015-06-03 – 2015-06-08 (×16): 1000 mg via ORAL
  Filled 2015-06-03 (×16): qty 2

## 2015-06-03 MED ORDER — ALBUMIN HUMAN 5 % IV SOLN
INTRAVENOUS | Status: DC | PRN
  Administered 2015-06-03: 10:00:00 via INTRAVENOUS

## 2015-06-03 MED ORDER — PROPOFOL 10 MG/ML IV BOLUS
INTRAVENOUS | Status: AC
  Filled 2015-06-03: qty 20

## 2015-06-03 MED ORDER — ONDANSETRON HCL 4 MG/2ML IJ SOLN
INTRAMUSCULAR | Status: DC | PRN
  Administered 2015-06-03: 4 mg via INTRAVENOUS

## 2015-06-03 MED ORDER — DEXMEDETOMIDINE HCL IN NACL 400 MCG/100ML IV SOLN
INTRAVENOUS | Status: DC | PRN
  Administered 2015-06-03: .6 ug/kg/h via INTRAVENOUS

## 2015-06-03 MED ORDER — ADULT MULTIVITAMIN W/MINERALS CH
1.0000 | ORAL_TABLET | Freq: Every day | ORAL | Status: DC
  Administered 2015-06-04 – 2015-06-08 (×5): 1 via ORAL
  Filled 2015-06-03 (×5): qty 1

## 2015-06-03 MED ORDER — LACTATED RINGERS IV SOLN
INTRAVENOUS | Status: DC | PRN
  Administered 2015-06-03: 07:00:00 via INTRAVENOUS

## 2015-06-03 MED ORDER — SUGAMMADEX SODIUM 200 MG/2ML IV SOLN
INTRAVENOUS | Status: AC
  Filled 2015-06-03: qty 2

## 2015-06-03 MED ORDER — MIDAZOLAM HCL 2 MG/2ML IJ SOLN
INTRAMUSCULAR | Status: AC
  Filled 2015-06-03: qty 2

## 2015-06-03 MED ORDER — HYDROMORPHONE HCL 1 MG/ML IJ SOLN
0.2500 mg | INTRAMUSCULAR | Status: DC | PRN
  Administered 2015-06-03 (×2): 0.5 mg via INTRAVENOUS

## 2015-06-03 MED ORDER — HYDROMORPHONE HCL 1 MG/ML IJ SOLN
INTRAMUSCULAR | Status: AC
  Filled 2015-06-03: qty 1

## 2015-06-03 MED ORDER — METOCLOPRAMIDE HCL 5 MG/ML IJ SOLN
10.0000 mg | Freq: Four times a day (QID) | INTRAMUSCULAR | Status: AC
  Administered 2015-06-03 – 2015-06-04 (×3): 10 mg via INTRAVENOUS
  Filled 2015-06-03 (×3): qty 2

## 2015-06-03 MED ORDER — ROCURONIUM BROMIDE 100 MG/10ML IV SOLN
INTRAVENOUS | Status: DC | PRN
  Administered 2015-06-03: 30 mg via INTRAVENOUS
  Administered 2015-06-03: 20 mg via INTRAVENOUS
  Administered 2015-06-03: 50 mg via INTRAVENOUS
  Administered 2015-06-03: 20 mg via INTRAVENOUS
  Administered 2015-06-03: 30 mg via INTRAVENOUS

## 2015-06-03 MED ORDER — SUGAMMADEX SODIUM 200 MG/2ML IV SOLN
INTRAVENOUS | Status: DC | PRN
  Administered 2015-06-03 (×2): 200 mg via INTRAVENOUS

## 2015-06-03 MED ORDER — ONDANSETRON HCL 4 MG/2ML IJ SOLN
INTRAMUSCULAR | Status: AC
  Filled 2015-06-03: qty 2

## 2015-06-03 MED ORDER — EPHEDRINE 5 MG/ML INJ
INTRAVENOUS | Status: AC
  Filled 2015-06-03: qty 10

## 2015-06-03 MED ORDER — ACETAMINOPHEN 160 MG/5ML PO SOLN
1000.0000 mg | Freq: Four times a day (QID) | ORAL | Status: DC
  Filled 2015-06-03: qty 40.6

## 2015-06-03 MED ORDER — DIPHENHYDRAMINE HCL 12.5 MG/5ML PO ELIX: 12.5000 mg | ORAL_SOLUTION | Freq: Four times a day (QID) | ORAL | Status: DC | PRN

## 2015-06-03 MED ORDER — LEVALBUTEROL HCL 0.63 MG/3ML IN NEBU
0.6300 mg | INHALATION_SOLUTION | Freq: Three times a day (TID) | RESPIRATORY_TRACT | Status: DC
  Administered 2015-06-04 – 2015-06-05 (×4): 0.63 mg via RESPIRATORY_TRACT
  Filled 2015-06-03 (×4): qty 3

## 2015-06-03 MED ORDER — FENTANYL CITRATE (PF) 100 MCG/2ML IJ SOLN
INTRAMUSCULAR | Status: DC | PRN
  Administered 2015-06-03 (×2): 100 ug via INTRAVENOUS
  Administered 2015-06-03 (×2): 50 ug via INTRAVENOUS
  Administered 2015-06-03: 100 ug via INTRAVENOUS
  Administered 2015-06-03: 50 ug via INTRAVENOUS

## 2015-06-03 MED ORDER — FENTANYL 40 MCG/ML IV SOLN
INTRAVENOUS | Status: AC
  Administered 2015-06-03 (×2): 45 ug via INTRAVENOUS
  Administered 2015-06-04: 30 ug via INTRAVENOUS
  Administered 2015-06-04: 45 ug via INTRAVENOUS
  Administered 2015-06-04: 225 ug via INTRAVENOUS
  Administered 2015-06-04: 165 ug via INTRAVENOUS
  Administered 2015-06-04: 35 ug via INTRAVENOUS
  Administered 2015-06-05: 06:00:00 via INTRAVENOUS
  Administered 2015-06-05: 330 ug via INTRAVENOUS
  Administered 2015-06-05: 30 ug via INTRAVENOUS
  Administered 2015-06-05 (×2): 90 ug via INTRAVENOUS
  Administered 2015-06-05: 225 ug via INTRAVENOUS
  Administered 2015-06-06: 30 ug via INTRAVENOUS
  Administered 2015-06-06: 105 ug via INTRAVENOUS
  Administered 2015-06-06: 120 ug via INTRAVENOUS
  Administered 2015-06-06: 105 ug via INTRAVENOUS
  Administered 2015-06-06: 60 ug via INTRAVENOUS
  Administered 2015-06-06: 105 ug via INTRAVENOUS
  Administered 2015-06-07: 195 ug via INTRAVENOUS
  Administered 2015-06-07: 30 ug via INTRAVENOUS
  Administered 2015-06-07: 15 ug via INTRAVENOUS
  Administered 2015-06-07: 90 ug via INTRAVENOUS
  Filled 2015-06-03 (×2): qty 25

## 2015-06-03 MED ORDER — ARTIFICIAL TEARS OP OINT
TOPICAL_OINTMENT | OPHTHALMIC | Status: AC
  Filled 2015-06-03: qty 3.5

## 2015-06-03 MED ORDER — OXYCODONE HCL 5 MG PO TABS: 5.0000 mg | ORAL_TABLET | ORAL | Status: DC | PRN

## 2015-06-03 MED ORDER — DEXMEDETOMIDINE HCL IN NACL 400 MCG/100ML IV SOLN
0.4000 ug/kg/h | INTRAVENOUS | Status: DC
  Filled 2015-06-03: qty 100

## 2015-06-03 MED ORDER — FOLIC ACID 1 MG PO TABS
1.0000 mg | ORAL_TABLET | Freq: Every day | ORAL | Status: DC
  Administered 2015-06-04 – 2015-06-08 (×5): 1 mg via ORAL
  Filled 2015-06-03 (×5): qty 1

## 2015-06-03 MED ORDER — LORAZEPAM 2 MG/ML IJ SOLN: 2.0000 mg | Freq: Four times a day (QID) | INTRAMUSCULAR | Status: AC | PRN

## 2015-06-03 MED ORDER — LORAZEPAM 1 MG PO TABS: 2.0000 mg | ORAL_TABLET | Freq: Four times a day (QID) | ORAL | Status: AC | PRN

## 2015-06-03 MED ORDER — OXYCODONE HCL 5 MG/5ML PO SOLN: 5.0000 mg | Freq: Once | ORAL | Status: DC | PRN

## 2015-06-03 MED ORDER — DEXMEDETOMIDINE HCL IN NACL 200 MCG/50ML IV SOLN
INTRAVENOUS | Status: AC
  Filled 2015-06-03: qty 50

## 2015-06-03 SURGICAL SUPPLY — 85 items
CANISTER SUCTION 2500CC (MISCELLANEOUS) ×6 IMPLANT
CATH KIT ON Q 5IN SLV (PAIN MANAGEMENT) IMPLANT
CATH THORACIC 28FR (CATHETERS) ×3 IMPLANT
CATH THORACIC 36FR (CATHETERS) IMPLANT
CATH THORACIC 36FR RT ANG (CATHETERS) IMPLANT
CLIP TI MEDIUM 6 (CLIP) IMPLANT
CONN ST 1/4X3/8  BEN (MISCELLANEOUS) ×2
CONN ST 1/4X3/8 BEN (MISCELLANEOUS) ×4 IMPLANT
CONN Y 3/8X3/8X3/8  BEN (MISCELLANEOUS) ×1
CONN Y 3/8X3/8X3/8 BEN (MISCELLANEOUS) ×2 IMPLANT
CONT SPEC 4OZ CLIKSEAL STRL BL (MISCELLANEOUS) ×12 IMPLANT
DERMABOND ADVANCED (GAUZE/BANDAGES/DRESSINGS) ×2
DERMABOND ADVANCED .7 DNX12 (GAUZE/BANDAGES/DRESSINGS) ×4 IMPLANT
DRAIN CHANNEL 28F RND 3/8 FF (WOUND CARE) IMPLANT
DRAIN CHANNEL 32F RND 10.7 FF (WOUND CARE) ×6 IMPLANT
DRAPE LAPAROSCOPIC ABDOMINAL (DRAPES) ×3 IMPLANT
DRAPE SLUSH/WARMER DISC (DRAPES) ×3 IMPLANT
DRAPE WARM FLUID 44X44 (DRAPE) IMPLANT
ELECT BLADE 6.5 EXT (BLADE) ×3 IMPLANT
ELECT REM PT RETURN 9FT ADLT (ELECTROSURGICAL) ×3
ELECTRODE REM PT RTRN 9FT ADLT (ELECTROSURGICAL) ×2 IMPLANT
GAUZE SPONGE 4X4 12PLY STRL (GAUZE/BANDAGES/DRESSINGS) ×3 IMPLANT
GLOVE BIO SURGEON STRL SZ 6.5 (GLOVE) ×3 IMPLANT
GLOVE BIO SURGEON STRL SZ7 (GLOVE) ×3 IMPLANT
GLOVE BIOGEL PI IND STRL 6 (GLOVE) ×2 IMPLANT
GLOVE BIOGEL PI IND STRL 6.5 (GLOVE) ×2 IMPLANT
GLOVE BIOGEL PI IND STRL 7.0 (GLOVE) ×2 IMPLANT
GLOVE BIOGEL PI INDICATOR 6 (GLOVE) ×1
GLOVE BIOGEL PI INDICATOR 6.5 (GLOVE) ×1
GLOVE BIOGEL PI INDICATOR 7.0 (GLOVE) ×1
GLOVE SURG SIGNA 7.5 PF LTX (GLOVE) ×6 IMPLANT
GOWN STRL REUS W/ TWL LRG LVL3 (GOWN DISPOSABLE) ×4 IMPLANT
GOWN STRL REUS W/ TWL XL LVL3 (GOWN DISPOSABLE) ×4 IMPLANT
GOWN STRL REUS W/TWL LRG LVL3 (GOWN DISPOSABLE) ×2
GOWN STRL REUS W/TWL XL LVL3 (GOWN DISPOSABLE) ×2
KIT BASIN OR (CUSTOM PROCEDURE TRAY) ×3 IMPLANT
KIT ROOM TURNOVER OR (KITS) ×3 IMPLANT
KIT SUCTION CATH 14FR (SUCTIONS) IMPLANT
NS IRRIG 1000ML POUR BTL (IV SOLUTION) ×6 IMPLANT
PACK CHEST (CUSTOM PROCEDURE TRAY) ×3 IMPLANT
PAD ARMBOARD 7.5X6 YLW CONV (MISCELLANEOUS) ×9 IMPLANT
POUCH ENDO CATCH II 15MM (MISCELLANEOUS) IMPLANT
POUCH SPECIMEN RETRIEVAL 10MM (ENDOMECHANICALS) IMPLANT
SEALANT PROGEL (MISCELLANEOUS) IMPLANT
SEALANT SURG COSEAL 4ML (VASCULAR PRODUCTS) IMPLANT
SEALANT SURG COSEAL 8ML (VASCULAR PRODUCTS) IMPLANT
SOLUTION ANTI FOG 6CC (MISCELLANEOUS) ×3 IMPLANT
SPECIMEN JAR MEDIUM (MISCELLANEOUS) IMPLANT
SPONGE GAUZE 4X4 12PLY STER LF (GAUZE/BANDAGES/DRESSINGS) ×3 IMPLANT
SPONGE INTESTINAL PEANUT (DISPOSABLE) ×39 IMPLANT
SPONGE LAP 18X18 X RAY DECT (DISPOSABLE) ×3 IMPLANT
SPONGE TONSIL 1 RF SGL (DISPOSABLE) ×3 IMPLANT
SUT PROLENE 4 0 RB 1 (SUTURE)
SUT PROLENE 4-0 RB1 .5 CRCL 36 (SUTURE) IMPLANT
SUT SILK  1 MH (SUTURE) ×4
SUT SILK 1 MH (SUTURE) ×8 IMPLANT
SUT SILK 1 TIES 10X30 (SUTURE) ×3 IMPLANT
SUT SILK 2 0SH CR/8 30 (SUTURE) IMPLANT
SUT SILK 3 0SH CR/8 30 (SUTURE) IMPLANT
SUT VIC AB 0 CTX 27 (SUTURE) IMPLANT
SUT VIC AB 1 CTX 27 (SUTURE) ×3 IMPLANT
SUT VIC AB 2-0 CT1 27 (SUTURE)
SUT VIC AB 2-0 CT1 TAPERPNT 27 (SUTURE) IMPLANT
SUT VIC AB 2-0 CTX 36 (SUTURE) ×3 IMPLANT
SUT VIC AB 3-0 MH 27 (SUTURE) ×9 IMPLANT
SUT VIC AB 3-0 SH 27 (SUTURE)
SUT VIC AB 3-0 SH 27X BRD (SUTURE) IMPLANT
SUT VIC AB 3-0 X1 27 (SUTURE) ×6 IMPLANT
SUT VICRYL 0 UR6 27IN ABS (SUTURE) ×3 IMPLANT
SUT VICRYL 2 TP 1 (SUTURE) IMPLANT
SWAB COLLECTION DEVICE MRSA (MISCELLANEOUS) ×3 IMPLANT
SWAB CULTURE ESWAB REG 1ML (MISCELLANEOUS) ×3 IMPLANT
SYRINGE 10CC LL (SYRINGE) IMPLANT
SYSTEM SAHARA CHEST DRAIN ATS (WOUND CARE) ×3 IMPLANT
TAPE CLOTH SURG 4X10 WHT LF (GAUZE/BANDAGES/DRESSINGS) ×3 IMPLANT
TIP APPLICATOR SPRAY EXTEND 16 (VASCULAR PRODUCTS) IMPLANT
TOWEL OR 17X24 6PK STRL BLUE (TOWEL DISPOSABLE) ×3 IMPLANT
TOWEL OR 17X26 10 PK STRL BLUE (TOWEL DISPOSABLE) ×6 IMPLANT
TRAP SPECIMEN MUCOUS 40CC (MISCELLANEOUS) ×6 IMPLANT
TRAY FOLEY BAG SILVER LF 14FR (SET/KITS/TRAYS/PACK) ×3 IMPLANT
TRAY FOLEY CATH 16FRSI W/METER (SET/KITS/TRAYS/PACK) ×3 IMPLANT
TROCAR XCEL BLADELESS 5X75MML (TROCAR) ×3 IMPLANT
TUBE ANAEROBIC SPECIMEN COL (MISCELLANEOUS) ×3 IMPLANT
TUNNELER SHEATH ON-Q 11GX8 DSP (PAIN MANAGEMENT) IMPLANT
WATER STERILE IRR 1000ML POUR (IV SOLUTION) ×3 IMPLANT

## 2015-06-03 NOTE — Anesthesia Procedure Notes (Addendum)
Central Venous Catheter Insertion Performed by: anesthesiologist Patient location: Pre-op. Preanesthetic checklist: patient identified, IV checked, site marked, risks and benefits discussed, surgical consent, monitors and equipment checked, pre-op evaluation, timeout performed and anesthesia consent Position: Trendelenburg Lidocaine 1% used for infiltration Landmarks identified and Seldinger technique used Catheter size: 8 Fr Central line was placed.Double lumen Procedure performed using ultrasound guided technique. Attempts: 1 Following insertion, dressing applied, line sutured and Biopatch. Post procedure assessment: blood return through all ports and free fluid flow. Patient tolerated the procedure well with no immediate complications.   Procedure Name: Intubation Date/Time: 06/03/2015 7:46 AM Performed by: Coralee RudFLORES, Oris Calmes Pre-anesthesia Checklist: Patient identified, Emergency Drugs available, Suction available and Patient being monitored Patient Re-evaluated:Patient Re-evaluated prior to inductionOxygen Delivery Method: Circle system utilized Preoxygenation: Pre-oxygenation with 100% oxygen Intubation Type: IV induction Ventilation: Mask ventilation without difficulty and Oral airway inserted - appropriate to patient size Laryngoscope Size: Mac and 3 Grade View: Grade I Tube type: Oral Endobronchial tube: Double lumen EBT, Left, EBT position confirmed by fiberoptic bronchoscope and EBT position confirmed by auscultation and 39 Fr Number of attempts: 1 Airway Equipment and Method: Stylet Placement Confirmation: ETT inserted through vocal cords under direct vision,  positive ETCO2 and breath sounds checked- equal and bilateral Tube secured with: Tape Dental Injury: Teeth and Oropharynx as per pre-operative assessment

## 2015-06-03 NOTE — Progress Notes (Signed)
PROGRESS NOTE  Brent Warren ZOX:[id number] DOB: [birth year] DOA: 06/01/2015 PCP: Pete Glatter, MD  Brief History:  55 year old male with a history of alcohol dependence presented with increasing cough and pleuritic chest pain. The patient was discharged from the hospital on 05/24/2015 after treatment for sepsis secondary to pneumonia. At that time, there was concern whether his pneumonia was aspiration versus development secondary to hypoventilation. The patient was discharged with Augmentin to finish on 05/27/2015. He endorsed compliance. The patient continued to have cough and pleuritic chest pain with dyspnea on exertion. [facility]. It was thought that he had acute bronchitis versus exacerbation of COPD, and was started on levofloxacin. The patient took one dose of levofloxacin. Outpatient chest x-ray revealed a loculated hydropneumothorax on the right. As result, the patient was injected to follow up in the emergency department. Outpatient CBC revealed a WBC 19.0. In the emergency department, the patient was afebrile and hemodynamically stable. Blood cultures were obtained after which the patient was started on vancomycin and Zosyn. Cardiothoracic surgery was consulted after CT of the chest confirmed right-sided loculated hydropneumothorax concerning for empyema. VATS is planned.  Assessment/Plan: Loculated Pleural Effusion/Hydropneumothorax -Concern for underlying empyema -Continue vancomycin 5/24>>> -d/c zosyn -continue cefepime 5/25>>> -appreciate Dr. Rolland Bimler  -06/03/15--VATS and decortication--fentanyl PCA for pain control -lactate 1.42 -follow intraop cultures and adjust abx -MRSA screen negative  Tobacco abuse -Tobacco cessation discussed  Alcohol dependence -last drink 11 days prior to admission -place on CIWA  Hypokalemia -repleted  COPD -stable  -wean back to RA as tolerated for sat  >92% -continue LABA  Hyponatremia -Secondary to volume depletion -Improved with normal saline   Disposition Plan: ICU Family Communication: No Family at beside  Consultants: Now on Dr. Sunday Corn service--I am happy to re-visit with any concerns. Sign off for now  Code Status: FULL  Subjective: Patient is drowsy after surgery, but denies any vomiting, shortness of breath, headache, fevers, chills. He complains of right-sided chest pain at the surgical site.  Objective: Filed Vitals:   06/03/15 1430 06/03/15 1500 06/03/15 1530 06/03/15 1546  BP: 123/68 126/113    Pulse: 96 95 108   Temp:      TempSrc:      Resp: 19 22 20    Height:      Weight:      SpO2: 95% 97% 96% 96%    Intake/Output Summary (Last 24 hours) at 06/03/15 1605 Last data filed at 06/03/15 1500  Gross per 24 hour  Intake   3360 ml  Output   3440 ml  Net    -80 ml   Weight change: 0.454 kg (1 lb) Exam:   General:  Pt is alert, follows commands appropriately, not in acute distress  HEENT: No icterus, No thrush,[place]/AT  Cardiovascular: RRR, S1/S2, no rubs, no gallops  Respiratory: Diminished breath sounds on the right with chest tubes in place. Left basilar crackles.  Abdomen: Soft/+BS, non tender, non distended, no guarding  Extremities: No edema, No lymphangitis, No petechiae, No rashes, no synovitis   Data Reviewed: I have personally reviewed following labs and imaging studies Basic Metabolic Panel:  Recent Labs Lab 05/31/15 1538 06/01/15 1454 06/02/15 0559 06/02/15 1915 06/03/15 0437 06/03/15 0838 06/03/15 0941  NA 135 133* 136 136 136 140 139  K 4.2 3.6 2.9* 3.4* 4.0 3.7 4.3  CL 96* 99* 104 104 102  --   --  CO2 25 24 24 24 24   --   --   GLUCOSE 112* 155* 95 105* 103*  --   --   BUN 9 10 7 8 7   --   --   CREATININE 0.51* 0.66 0.63 0.68 0.67  --   --   CALCIUM 8.9 8.8* 8.1* 8.2* 8.9  --   --    Liver Function Tests:  Recent Labs Lab 06/01/15 2235 06/02/15 1915    AST 40 55*  ALT 33 39  ALKPHOS 117 108  BILITOT 0.5 0.4  PROT 6.2* 6.0*  ALBUMIN 1.9* 1.7*   No results for input(s): LIPASE, AMYLASE in the last 168 hours. No results for input(s): AMMONIA in the last 168 hours. Coagulation Profile:  Recent Labs Lab 06/01/15 2235 06/02/15 1915  INR 1.14 1.08   CBC:  Recent Labs Lab 05/31/15 1538 06/01/15 1454 06/02/15 0559 06/02/15 1915 06/03/15 0437 06/03/15 0838 06/03/15 0941  WBC 19.0* 15.6* 14.3* 12.8* 14.1*  --   --   NEUTROABS 15960*  --   --   --   --   --   --   HGB 10.7* 9.8* 8.9* 9.0* 10.0* 10.5* 10.2*  HCT 32.8* 29.2* 27.8* 27.4* 30.8* 31.0* 30.0*  MCV 100.0 97.3 100.7* 97.9 98.4  --   --   PLT 817* 745* 681* 694* 778*  --   --    Cardiac Enzymes: No results for input(s): CKTOTAL, CKMB, CKMBINDEX, TROPONINI in the last 168 hours. BNP: Invalid input(s): POCBNP CBG: No results for input(s): GLUCAP in the last 168 hours. HbA1C: No results for input(s): HGBA1C in the last 72 hours. Urine analysis:    Component Value Date/Time   COLORURINE YELLOW 06/02/2015 1952   APPEARANCEUR CLEAR 06/02/2015 1952   LABSPEC 1.010 06/02/2015 1952   PHURINE 7.5 06/02/2015 1952   GLUCOSEU NEGATIVE 06/02/2015 1952   HGBUR NEGATIVE 06/02/2015 1952   BILIRUBINUR NEGATIVE 06/02/2015 1952   KETONESUR NEGATIVE 06/02/2015 1952   PROTEINUR NEGATIVE 06/02/2015 1952   UROBILINOGEN 1.0 10/29/2014 0418   NITRITE NEGATIVE 06/02/2015 1952   LEUKOCYTESUR NEGATIVE 06/02/2015 1952   Sepsis Labs: @LABRCNTIP (procalcitonin:4,lacticidven:4) ) Recent Results (from the past 240 hour(s))  Blood culture (routine x 2)     Status: None (Preliminary result)   Collection Time: 06/01/15  6:30 PM  Result Value Ref Range Status   Specimen Description BLOOD RIGHT ANTECUBITAL  Final   Special Requests BOTTLES DRAWN AEROBIC ONLY 3CC  Final   Culture NO GROWTH 2 DAYS  Final   Report Status PENDING  Incomplete  Blood culture (routine x 2)     Status: None  (Preliminary result)   Collection Time: 06/01/15  6:36 PM  Result Value Ref Range Status   Specimen Description BLOOD LEFT HAND  Final   Special Requests BOTTLES DRAWN AEROBIC ONLY 5CC  Final   Culture NO GROWTH 2 DAYS  Final   Report Status PENDING  Incomplete  Surgical pcr screen     Status: None   Collection Time: 06/02/15 11:34 PM  Result Value Ref Range Status   MRSA, PCR NEGATIVE NEGATIVE Final   Staphylococcus aureus NEGATIVE NEGATIVE Final    Comment:        The Xpert SA Assay (FDA approved for NASAL specimens in patients over 48 years of age), is one component of a comprehensive surveillance program.  Test performance has been validated by [facility] for patients greater than or equal to 8 year old. It is not intended to  diagnose infection nor to guide or monitor treatment.   Aerobic/Anaerobic Culture(surg specimen) (NOT AT [facility])     Status: None (Preliminary result)   Collection Time: 06/03/15  8:51 AM  Result Value Ref Range Status   Specimen Description TISSUE PLEURAL  Final   Special Requests PARIETAL PLEURAL POF CEPEPIME  Final   Gram Stain PENDING  Incomplete   Culture PENDING  Incomplete   Report Status PENDING  Incomplete  Gram stain     Status: None   Collection Time: 06/03/15  9:36 AM  Result Value Ref Range Status   Specimen Description FLUID RIGHT PLEURAL  Final   Special Requests NONE  Final   Gram Stain   Final    ABUNDANT WBC PRESENT, PREDOMINANTLY PMN NO ORGANISMS SEEN    Report Status 06/03/2015 FINAL  Final     Scheduled Meds: . acetaminophen  1,000 mg Oral Q6H   Or  . acetaminophen (TYLENOL) oral liquid 160 mg/5 mL  1,000 mg Oral Q6H  . bisacodyl  10 mg Oral Daily  . ceFEPime (MAXIPIME) IV  2 g Intravenous Q8H  . feeding supplement (ENSURE ENLIVE)  237 mL Oral BID BM  . fentaNYL   Intravenous Q4H  . [START ON 06/04/2015] folic acid  1 mg Oral Daily  . guaiFENesin  600 mg Oral BID  . HYDROmorphone      . levalbuterol  0.63 mg  Nebulization Q6H  . metoCLOPramide (REGLAN) injection  10 mg Intravenous Q6H  . mometasone-formoterol  2 puff Inhalation BID  . [START ON 06/04/2015] multivitamin with minerals  1 tablet Oral Daily  . nicotine  21 mg Transdermal Daily  . senna-docusate  1 tablet Oral QHS  . thiamine  100 mg Oral Daily   Or  . thiamine  100 mg Intravenous Daily  . vancomycin  500 mg Intravenous Q8H   Continuous Infusions: . 0.9 % NaCl with KCl 20 mEq / L 100 mL/hr at 06/03/15 1532    Procedures/Studies: Dg Chest 2 View  06/01/2015  CLINICAL DATA:  55 year old male with cough and shortness of breath for 1 week. Subsequent encounter. EXAM: CHEST  2 VIEW COMPARISON:  05/31/2015 and earlier, including CT Abdomen and Pelvis 10/29/2014. FINDINGS: Continued loculated appearing right lower lobe hydro pneumothorax, unchanged since yesterday. No new areas of right lung opacity. The left lung remain stable in clear. Mediastinal contours remain within normal limits. Visualized tracheal air column is within normal limits. No acute osseous abnormality identified. IMPRESSION: Unchanged radiographic appearance of loculated right lower lobe hydropneumothorax since yesterday. This is a new process since 10/29/2014, therefore lung base infection with necrosis or cavitation is the main differential consideration. Electronically Signed   By: Odessa FlemingH  Hall M.D.   On: 06/01/2015 15:51   Dg Chest 2 View  06/01/2015  CLINICAL DATA:  Cough, fever.  Possible pneumonia. EXAM: CHEST  2 VIEW COMPARISON:  05/22/2015 FINDINGS: Normal heart size. Loculated right sided scratch set there is a right-sided, loculated hydro pneumothorax identified. This is a new finding when compared with previous exam. The left lung appears clear. The visualized osseous structures are unremarkable. IMPRESSION: 1. New, loculated hydro pneumothorax is identified on the right. In the setting of suspected pneumonia underlying empyema is not excluded. Electronically Signed   By:  Signa Kellaylor  Stroud M.D.   On: 06/01/2015 08:22   Dg Chest 2 View  05/22/2015  CLINICAL DATA:  55 year old male with pleural effusion and cough and chest pain. EXAM: CHEST  2 VIEW COMPARISON:  Chest radiograph dated 05/22/2015 FINDINGS: There is a small right pleural effusion. Right lung base increased opacity may represent atelectatic changes versus infiltrate. The left lung is clear. There is no pneumothorax. The cardiac silhouette is within normal limits. No acute osseous pathology. IMPRESSION: Small right pleural effusion with right lung base atelectasis/infiltrate, slightly increased compared to prior study. Electronically Signed   By: Elgie Collard M.D.   On: 05/22/2015 22:39   Ct Chest W Contrast  06/01/2015  CLINICAL DATA:  Cough and shortness of breath. Acquired pneumonia. Follow-up hydro pneumothorax on the right. EXAM: CT CHEST WITH CONTRAST TECHNIQUE: Multidetector CT imaging of the chest was performed during intravenous contrast administration. CONTRAST:  1 ISOVUE-300 IOPAMIDOL (ISOVUE-300) INJECTION 61% COMPARISON:  Chest radiography same day and multiple previous FINDINGS: Background pattern of centrilobular emphysema and paraseptal emphysema. On the left, there are focal densities posteriorly at the base which appear somewhat nodular. These could be areas of infiltrate or atelectasis, but masses are not excluded and follow-up of these is suggested tissue resolution. The largest laterally measures approximately 19 mm. On the right, there is a peripheral E loculated pleural fluid and air collection posterior laterally consistent with empyema. There is compressive atelectasis and or infiltrate in the adjacent lung, primarily the right lower lobe but also to some extent the right middle lobe. Mild hilar nodal prominence, likely reactive. No paratracheal adenopathy. Scans in the upper abdomen are unremarkable. No bony abnormality. IMPRESSION: Loculated empyema in the right posterior lateral pleural  space. Volume loss and/or pneumonia in the right lower lobe and to a minimal extent the right middle lobe. Empyema measures approximately 12 x 6 by 12 cm. Patchy nodular densities at the left lung base that probably represent pneumonia. Follow-up of these is recommended to assure resolution however. Electronically Signed   By: Paulina Fusi M.D.   On: 06/01/2015 21:41   Dg Chest Port 1 View  06/03/2015  CLINICAL DATA:  Postop right lung surgery.  Empyema. EXAM: PORTABLE CHEST 1 VIEW COMPARISON:  06/01/2015 and 05/22/2015 as well as CT 06/01/2015 FINDINGS: Right IJ central venous catheter has tip over the SVC. Right apical chest tube appears in adequate position. Curvilinear catheter over the medial right chest. Additional catheter over the right base. Significant interval improvement in the known right empyema as air-fluid level no longer evident. Left lung is clear. Remainder of the exam is unchanged. IMPRESSION: Significant interval improvement in known right-sided empyema with air-fluid level no longer visualized. Right-sided chest tubes present. Electronically Signed   By: Elberta Fortis M.D.   On: 06/03/2015 11:49   Dg Chest Portable 1 View  05/22/2015  CLINICAL DATA:  Chest pain; altered mental status EXAM: PORTABLE CHEST 1 VIEW COMPARISON:  08/04/2013 heart is normal in size. There is dense consolidation at the right lung base which obscures the hemidiaphragm. Right pleural effusion is associated. Left lung is clear. FINDINGS: The heart size and mediastinal contours are within normal limits. Both lungs are clear. The visualized skeletal structures are unremarkable. IMPRESSION: Right lower lobe infiltrate and pleural effusion. Followup PA and lateral chest X-ray is recommended in 3-4 weeks following trial of antibiotic therapy to ensure resolution and exclude underlying malignancy. Electronically Signed   By: Norva Pavlov M.D.   On: 05/22/2015 18:02    Alanis Clift, DO  [facility] Pager  [phone]  If 7PM-7AM, please contact night-coverage [email or web address] Password [email or web address] 06/03/2015, 4:05 PM   LOS: 2 days

## 2015-06-03 NOTE — Progress Notes (Signed)
      [address]       [place] [id number]             [phone]      Resting at present  BP 120/85 mmHg  Pulse 107  Temp(Src) 98.3 F (36.8 C) (Oral)  Resp 25  Ht 5\' 9"  (1.753 m)  Wt 123 lb (55.792 kg)  BMI 18.16 kg/m2  SpO2 97%   Intake/Output Summary (Last 24 hours) at 06/03/15 1832 Last data filed at 06/03/15 1800  Gross per 24 hour  Intake 3656.67 ml  Output   3030 ml  Net 626.67 ml   Doing well early postop.  Brent DecentSteven C. Dorris FetchHendrickson, MD [facility] [phone]
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
## 2015-06-03 NOTE — Brief Op Note (Addendum)
06/01/2015 - 06/03/2015  11:04 AM  PATIENT:  Brent Warren  55 y.o. male  PRE-OPERATIVE DIAGNOSIS:  RIGHT LOCULATED PLEURAL EFFUSION  POST-OPERATIVE DIAGNOSIS:  RIGHT ORGANIZED EMPYEMA  PROCEDURE:  Procedure(s):  VIDEO ASSISTED THORACOSCOPY (VATS) -Drainage of Empyema -Decortication of Lung  SURGEON:  Surgeon(s) and Role:    * Loreli SlotSteven C Mandy Fitzwater, MD - Primary  PHYSICIAN ASSISTANT: Erin Barrett PA-C  ANESTHESIA:   general  EBL:  Total I/O In: 250 [IV Piggyback:250] Out: 1390 [Urine:1390]  BLOOD ADMINISTERED:none  DRAINS: 32 Blake x 2,  28 Straight Chest tube   LOCAL MEDICATIONS USED:  NONE  SPECIMEN:  Source of Specimen:  Right Pleural Fluid, Parietal, Visceral Peel  DISPOSITION OF SPECIMEN:  Microbiology, Pathology  COUNTS:  YES  PLAN OF CARE: Admit to inpatient   PATIENT DISPOSITION:  ICU - extubated and stable.   Delay start of Pharmacological VTE agent (>24hrs) due to surgical blood loss or risk of bleeding: yes  Organized empyema with extensive exudative debris. Fibrous peel on lower and middle lobes densely adherent to underlying lung. Good re-expansion Radiation protection practitionerpostdecortication  Dictation # [id number]

## 2015-06-03 NOTE — Transfer of Care (Signed)
Immediate Anesthesia Transfer of Care Note  Patient: Brent Warren   Procedure(s) Performed: Procedure(s): VIDEO ASSISTED THORACOSCOPY (VATS)/EMPYEMA (Right) DECORTICATION (Right)  Patient Location: PACU  Anesthesia Type:General  Level of Consciousness: awake, alert , sedated and patient cooperative  Airway & Oxygen Therapy: Patient Spontanous Breathing and Patient connected to face mask oxygen  Post-op Assessment: Report given to RN, Post -op Vital signs reviewed and stable and Patient moving all extremities  Post vital signs: Reviewed  Last Vitals:  Filed Vitals:   06/02/15 1952 06/03/15 0429  BP: 163/81 170/82  Pulse: 65 64  Temp: 36.8 C 36.8 C  Resp: 18 18    Last Pain:  Filed Vitals:   06/03/15 1141  PainSc: 0-No pain         Complications: No apparent anesthesia complications

## 2015-06-03 NOTE — Anesthesia Postprocedure Evaluation (Signed)
Anesthesia Post Note  Patient: Brent Warren  Procedure(s) Performed: Procedure(s) (LRB): VIDEO ASSISTED THORACOSCOPY (VATS)/EMPYEMA (Right) DECORTICATION (Right)  Patient location during evaluation: PACU Anesthesia Type: General Level of consciousness: awake Pain management: pain level controlled Vital Signs Assessment: post-procedure vital signs reviewed and stable Respiratory status: spontaneous breathing Cardiovascular status: stable Postop Assessment: no signs of nausea or vomiting Anesthetic complications: no    Last Vitals:  Filed Vitals:   06/03/15 1600 06/03/15 1620  BP: 115/102   Pulse: 98   Temp:  36.8 C  Resp: 21     Last Pain:  Filed Vitals:   06/03/15 1627  PainSc: Asleep                 Nikeya Maxim

## 2015-06-03 NOTE — Anesthesia Preprocedure Evaluation (Signed)
Anesthesia Evaluation  Patient identified by MRN, date of birth, ID band Patient awake    Reviewed: Allergy & Precautions, NPO status , Patient's Chart, lab work & pertinent test results  History of Anesthesia Complications Negative for: history of anesthetic complications  Airway Mallampati: II  TM Distance: >3 FB Neck ROM: Full    Dental  (+) Missing   Pulmonary Current Smoker,    breath sounds clear to auscultation       Cardiovascular negative cardio ROS   Rhythm:Regular     Neuro/Psych negative neurological ROS     GI/Hepatic negative GI ROS, (+)     substance abuse  alcohol use,   Endo/Other  negative endocrine ROS  Renal/GU      Musculoskeletal   Abdominal   Peds  Hematology  (+) anemia ,   Anesthesia Other Findings   Reproductive/Obstetrics                             Anesthesia Physical Anesthesia Plan  ASA: III  Anesthesia Plan: General   Post-op Pain Management:    Induction: Intravenous  Airway Management Planned: Double Lumen EBT  Additional Equipment: Arterial line, CVP and Ultrasound Guidance Line Placement  Intra-op Plan:   Post-operative Plan: Extubation in OR  Informed Consent: I have reviewed the patients History and Physical, chart, labs and discussed the procedure including the risks, benefits and alternatives for the proposed anesthesia with the patient or authorized representative who has indicated his/her understanding and acceptance.   Dental advisory given  Plan Discussed with: Anesthesiologist and CRNA  Anesthesia Plan Comments:         Anesthesia Quick Evaluation

## 2015-06-03 NOTE — Interval H&P Note (Signed)
History and Physical Interval Note:  06/03/2015 7:07 AM  Brent Warren  has presented today for surgery, with the diagnosis of RIGHT LOCULATED EFFUSION  The various methods of treatment have been discussed with the patient and family. After consideration of risks, benefits and other options for treatment, the patient has consented to  Procedure(s): VIDEO ASSISTED THORACOSCOPY (VATS)/EMPYEMA (Right) DECORTICATION (Right) as a surgical intervention .  The patient's history has been reviewed, patient examined, no change in status, stable for surgery.  I have reviewed the patient's chart and labs.  Questions were answered to the patient's satisfaction.     Loreli SlotSteven C Clio Gerhart

## 2015-06-04 ENCOUNTER — Inpatient Hospital Stay (HOSPITAL_COMMUNITY): Payer: Self-pay

## 2015-06-04 LAB — GLUCOSE, CAPILLARY: GLUCOSE-CAPILLARY: 89 mg/dL (ref 65–99)

## 2015-06-04 LAB — BASIC METABOLIC PANEL
Anion gap: 7 (ref 5–15)
BUN: 6 mg/dL (ref 6–20)
CALCIUM: 7.9 mg/dL — AB (ref 8.9–10.3)
CO2: 22 mmol/L (ref 22–32)
CREATININE: 0.65 mg/dL (ref 0.61–1.24)
Chloride: 107 mmol/L (ref 101–111)
GFR calc Af Amer: >60 mL/min (ref >60)
GFR calc non Af Amer: >60 mL/min (ref >60)
GLUCOSE: 100 mg/dL — AB (ref 65–99)
Potassium: 4.3 mmol/L (ref 3.5–5.1)
Sodium: 136 mmol/L (ref 135–145)

## 2015-06-04 LAB — CBC
HEMATOCRIT: 25 % — AB (ref 39.0–52.0)
Hemoglobin: 8.2 g/dL — ABNORMAL LOW (ref 13.0–17.0)
MCH: 33.2 pg (ref 26.0–34.0)
MCHC: 32.8 g/dL (ref 30.0–36.0)
MCV: 101.2 fL — AB (ref 78.0–100.0)
PLATELETS: 551 10*3/uL — AB (ref 150–400)
RBC: 2.47 MIL/uL — AB (ref 4.22–5.81)
RDW: 13.3 % (ref 11.5–15.5)
WBC: 19.6 10*3/uL — AB (ref 4.0–10.5)

## 2015-06-04 LAB — ACID FAST SMEAR (AFB, MYCOBACTERIA)

## 2015-06-04 LAB — BLOOD GAS, ARTERIAL
ACID-BASE DEFICIT: 2.4 mmol/L — AB (ref 0.0–2.0)
Bicarbonate: 21.5 mEq/L (ref 20.0–24.0)
Drawn by: 36496
FIO2: 0.36
O2 Saturation: 94.7 %
PCO2 ART: 33.7 mmHg — AB (ref 35.0–45.0)
PO2 ART: 75.8 mmHg — AB (ref 80.0–100.0)
Patient temperature: 98.3
TCO2: 22.5 mmol/L (ref 0–100)
pH, Arterial: 7.42 (ref 7.350–7.450)

## 2015-06-04 LAB — VANCOMYCIN, TROUGH: VANCOMYCIN TR: 11 ug/mL (ref 10.0–20.0)

## 2015-06-04 LAB — ACID FAST SMEAR (AFB): ACID FAST SMEAR - AFSCU2: NEGATIVE

## 2015-06-04 MED ORDER — VANCOMYCIN HCL 500 MG IV SOLR
500.0000 mg | Freq: Three times a day (TID) | INTRAVENOUS | Status: DC
  Administered 2015-06-04 – 2015-06-05 (×5): 500 mg via INTRAVENOUS
  Filled 2015-06-04 (×6): qty 500

## 2015-06-04 MED ORDER — ENOXAPARIN SODIUM 40 MG/0.4ML ~~LOC~~ SOLN
40.0000 mg | SUBCUTANEOUS | Status: DC
  Administered 2015-06-04 – 2015-06-08 (×5): 40 mg via SUBCUTANEOUS
  Filled 2015-06-04 (×5): qty 0.4

## 2015-06-04 NOTE — Progress Notes (Signed)
      [address]       [place], [id number]             [phone]      Comfortable  BP 131/80 mmHg  Pulse 77  Temp(Src) 98 F (36.7 C) (Oral)  Resp 20  Ht 5\' 9"  (1.753 m)  Wt 121 lb 7.6 oz (55.1 kg)  BMI 17.93 kg/m2  SpO2 96%   Intake/Output Summary (Last 24 hours) at 06/04/15 1733 Last data filed at 06/04/15 1700  Gross per 24 hour  Intake   2840 ml  Output   1625 ml  Net   1215 ml    Awaiting bed on [address]  Steven C. Dorris FetchHendrickson, MD [facility] [phone]

## 2015-06-04 NOTE — Progress Notes (Signed)
06/04/2015 1150 Left radial a line d/c per order. Pressure dressing applied. Pt. Tolerated well.  Jamarie Joplin, Blanchard KelchStephanie Ingold

## 2015-06-04 NOTE — Op Note (Signed)
NAMArnoldo Lenis:  Warren, Brent                 ACCOUNT NO.:  [id number]  MEDICAL RECORD NO.:  [id number]  LOCATION:  2S10C                        FACILITY:  [facility]  PHYSICIAN:  Salvatore DecentSteven C. Dorris FetchHendrickson, M.D.DATE OF BIRTH:  [birth year]  DATE OF PROCEDURE:  06/03/2015 DATE OF DISCHARGE:                              OPERATIVE REPORT   PREOPERATIVE DIAGNOSIS:  Loculated right pleural effusion.  POSTOPERATIVE DIAGNOSIS:  Organized empyema, right pleural space.  PROCEDURE:   Right video-assisted thoracoscopy Drainage of empyema,  Decortication of visceral and parietal pleura.  SURGEON:  Salvatore DecentSteven C. Dorris FetchHendrickson, M.D.  ASSISTANT:  Lowella DandyErin Barrett, PA.  ANESTHESIA:  General.  FINDINGS:  Organized empyema. Extensive exudative debris within the chest. Tissue friable. Dense fibrous peel on the lower and middle lobes that was densely adherent to the underlying lung. Good re-expansion post- decortication.  CLINICAL NOTE:  [name] is a 55 year old gentleman, who presented with a 3-week history of persistent cough and chest pain.  Chest x-ray and CT showed a loculated hydropneumothorax on the right side.  He was advised to undergo right video-assisted thoracoscopy, drainage of effusion, and possible decortication.  The indications, risks, benefits, and alternatives were discussed in detail with the patient.  He understood and accepted the risks and agreed to proceed.  OPERATIVE NOTE:  [name] was brought to the preoperative holding area on Jun 03, 2015, anesthesia placed a central line and arterial blood pressure monitoring line.  He was taken to the operating room, anesthetized, and intubated with a double-lumen endotracheal tube. Sequential compression devices were placed on the calves for DVT prophylaxis.  A Foley catheter was placed.  He was placed in a left lateral decubitus position, and the right chest was prepped and draped in usual sterile fashion.  Single lung ventilation of the left lung  was initiated.  The patient was receiving intravenous antibiotics on time schedule.  An incision was made in approximately the fifth interspace posterolaterally.  The chest entered bluntly using a hemostat.  A sucker was placed into the chest and some murky greenish gray fluid was evacuated.  This was sent for aerobic and anaerobic cultures as well as AFB and fungal cultures.  Anaerobic swabs were also obtained.  A 5 mm port was passed through the incision, and a thoracoscope was advanced into the chest. An organizing empyema was clearly evident.  An additional port incision was made in the seventh interspace in the midaxillary line, and a working incision was made in the fourth interspace anterolaterally.  No rib spreading was performed during the procedure. The working incision was approximately 5 cm in length.  Blunt finger dissection was used to create a space connecting the 3 incisions.  The scope was then replaced into the chest.  The tissue was very friable. There was bleeding.  The chest was intermittently irrigated with saline to help clear blood and clots to assist with visualization.  The exudate in the pleural space was removed, the majority was gelatinous in nature. There was both a visceral and parietal pleural peel.  The parietal pleural peel was relatively easily removed.  The visceral pleural peel was more densely adherent, particularly over the bottom half of the  lower lobe and the middle lobe.  The visceral pleural decortication was performed.  This was a slow and tedious process.  It was difficult to find an appropriate plane.  There were multiple small pleural tears during the course of the decortication. It was particularly difficult to free the inferior edge of the lung from the diaphragm, but once that was done, the remainder the diaphragm was exposed easily.  The lung was taken down off the pleura circumferentially to make sure that there were no loculated areas  of fluid that were missed.  The lung intermittently had to be reinflated as the patient's saturations were slowly decreased over time.  With each reinflation, assessment was made while the lung was re-expanded.  Once the re-expansion appeared adequate, a parietal pleural decortication was performed.  There was a lot of bleeding with this process.  The chest was copiously irrigated with 2 L of saline. There was no significant ongoing bleeding.  Two additional port incisions were made more anteriorly in the seventh interspace incision.  A 28- French chest tube was placed through the most anterior one and directed to the apex.  32-French Blake drains were placed through the other 2 port incisions, one was placed along the diaphragmatic surface and the other was placed posteriorly, all were secured to skin with #1 silk sutures.  The chest tubes were placed to suction.  The right lung was reinflated with good re-expansion of all 3 lobes.  The posterior port incision was closed with #1 Vicryl suture and a 3-0 Vicryl subcuticular suture.  The working incision was closed with #1 Vicryl fascial suture, subcutaneous tissue and skin were closed in standard fashion.  The patient was placed back in a supine position.  He was extubated in the operating room and taken to the postanesthetic care unit in good condition.     Salvatore Decent Dorris Fetch, M.D.     SCH/MEDQ  D:  06/03/2015  T:  06/04/2015  Job:  [id number]

## 2015-06-04 NOTE — Progress Notes (Signed)
1 Day Post-Op Procedure(s) (LRB): VIDEO ASSISTED THORACOSCOPY (VATS)/EMPYEMA (Right) DECORTICATION (Right) Subjective: C/o pain  Objective: Vital signs in last 24 hours: Temp:  [97.2 F (36.2 C)-98.9 F (37.2 C)] 98.9 F (37.2 C) (05/27 0833) Pulse Rate:  [73-136] 88 (05/27 0800) Cardiac Rhythm:  [-] Sinus tachycardia (05/27 0800) Resp:  [17-27] 22 (05/27 0800) BP: (91-166)/(63-113) 158/77 mmHg (05/27 0800) SpO2:  [88 %-100 %] 97 % (05/27 0810) Arterial Line BP: (72-142)/(49-107) 95/79 mmHg (05/27 0800) Weight:  [121 lb 7.6 oz (55.1 kg)] 121 lb 7.6 oz (55.1 kg) (05/27 0600)  Hemodynamic parameters for last 24 hours:    Intake/Output from previous day: 05/26 0701 - 05/27 0700 In: 4886.7 [P.O.:240; I.V.:3846.7; IV Piggyback:800] Out: 2870 [Urine:2300; Blood:150; Chest Tube:420] Intake/Output this shift: Total I/O In: 100 [I.V.:100] Out: 195 [Urine:75; Chest Tube:120]  General appearance: alert, cooperative and no distress Neurologic: intact Heart: regular rate and rhythm Lungs: improved BS right base Abdomen: normal findings: soft, non-tender no air leak  Lab Results:  Recent Labs  06/03/15 0437  06/03/15 0941 06/04/15 0411  WBC 14.1*  --   --  19.6*  HGB 10.0*  < > 10.2* 8.2*  HCT 30.8*  < > 30.0* 25.0*  PLT 778*  --   --  551*  < > = values in this interval not displayed. BMET:  Recent Labs  06/03/15 0437  06/03/15 0941 06/04/15 0411  NA 136  < > 139 136  K 4.0  < > 4.3 4.3  CL 102  --   --  107  CO2 24  --   --  22  GLUCOSE 103*  --   --  100*  BUN 7  --   --  6  CREATININE 0.67  --   --  0.65  CALCIUM 8.9  --   --  7.9*  < > = values in this interval not displayed.  PT/INR:  Recent Labs  06/02/15 1915  LABPROT 14.2  INR 1.08   ABG    Component Value Date/Time   PHART 7.420 06/04/2015 0355   HCO3 21.5 06/04/2015 0355   TCO2 22.5 06/04/2015 0355   ACIDBASEDEF 2.4* 06/04/2015 0355   O2SAT 94.7 06/04/2015 0355   CBG (last 3)  No results  for input(s): GLUCAP in the last 72 hours.  Assessment/Plan: S/P Procedure(s) (LRB): VIDEO ASSISTED THORACOSCOPY (VATS)/EMPYEMA (Right) DECORTICATION (Right) Plan for transfer to step-down: see transfer orders  CV- stable  RESP- s/p decortication for empyema  Continue vanco and maxipime  Keep CT in place and on suction today  RENAL- creatinine and lytes ok  DVT propylaxis- SCD + enoxaparin  ETOH- on withdrawal protocol  OOB, ambulate   LOS: 3 days    Loreli SlotSteven C Hendrickson 06/04/2015

## 2015-06-04 NOTE — Progress Notes (Signed)
ANTIBIOTIC CONSULT NOTE   Pharmacy Consult for Vanco/Cefepime Indication: Empyema, PNA  No Known Allergies  Patient Measurements: Height: 5\' 9"  (175.3 cm) Weight: 121 lb 7.6 oz (55.1 kg) IBW/kg (Calculated) : 70.7 Adjusted Body Weight:    Vital Signs: Temp: 98.6 F (37 C) (05/27 1201) Temp Source: Oral (05/27 1201) BP: 126/84 mmHg (05/27 1200) Pulse Rate: 92 (05/27 1200) Intake/Output from previous day: 05/26 0701 - 05/27 0700 In: 4886.7 [P.O.:240; I.V.:3846.7; IV Piggyback:800] Out: 2870 [Urine:2300; Blood:150; Chest Tube:420] Intake/Output from this shift: Total I/O In: 300 [I.V.:300] Out: 570 [Urine:450; Chest Tube:120]  Labs:  Recent Labs  06/02/15 1915 06/03/15 0437 06/03/15 0838 06/03/15 0941 06/04/15 0411  WBC 12.8* 14.1*  --   --  19.6*  HGB 9.0* 10.0* 10.5* 10.2* 8.2*  PLT 694* 778*  --   --  551*  CREATININE 0.68 0.67  --   --  0.65   Estimated Creatinine Clearance: 81.3 mL/min (by C-G formula based on Cr of 0.65).  Recent Labs  06/04/15 1120  VANCOTROUGH 11     Microbiology:   Medical History: Past Medical History  Diagnosis Date  . Alcoholic (HCC)   . Tobacco abuse    Assessment:  ID: Diagnosed with CAP prior to admission, finished Augmentin and prescribed levofloxacin - started 5/23. WBC 14.1>19.6, afeb. VATS 5/26. CXR shows hydropneumothorax/empyema. Vanco trough low this AM due to missed dose yesterday. Renal stable  Vanc 5/24>> --5/27 VT 11 (missed dose 5/26): continue same Zosyn 5/24>>5/25 Cefepime 5/25>>  5/24: BC x 2>>NGTD 5/26: Pleural fluid: NGTD 5/26 Anerobic pleural tissue: NGTD   Goal of Therapy:  Vancomycin trough level 15-20 mcg/ml  Plan:  -Vancomycin 500mg  IV q8h continue -Cefepime 2g IV q8h per MD - d/c either Dulera or Xopenex due to duplication in beta-2-agonist therapy?   Fletcher Ostermiller S. Merilynn Finlandobertson, PharmD, BCPS Clinical Staff Pharmacist Pager [phone]  Misty Stanleyobertson, Damika Harmon Stillinger 06/04/2015,1:09 PM

## 2015-06-05 ENCOUNTER — Inpatient Hospital Stay (HOSPITAL_COMMUNITY): Payer: Self-pay

## 2015-06-05 LAB — CBC
HCT: 27.3 % — ABNORMAL LOW (ref 39.0–52.0)
Hemoglobin: 8.8 g/dL — ABNORMAL LOW (ref 13.0–17.0)
MCH: 31.8 pg (ref 26.0–34.0)
MCHC: 32.2 g/dL (ref 30.0–36.0)
MCV: 98.6 fL (ref 78.0–100.0)
PLATELETS: 609 10*3/uL — AB (ref 150–400)
RBC: 2.77 MIL/uL — ABNORMAL LOW (ref 4.22–5.81)
RDW: 13.2 % (ref 11.5–15.5)
WBC: 19.1 10*3/uL — AB (ref 4.0–10.5)

## 2015-06-05 LAB — COMPREHENSIVE METABOLIC PANEL
ALT: 25 U/L (ref 17–63)
ANION GAP: 8 (ref 5–15)
AST: 26 U/L (ref 15–41)
Albumin: 1.9 g/dL — ABNORMAL LOW (ref 3.5–5.0)
Alkaline Phosphatase: 97 U/L (ref 38–126)
BUN: 5 mg/dL — AB (ref 6–20)
CHLORIDE: 104 mmol/L (ref 101–111)
CO2: 22 mmol/L (ref 22–32)
Calcium: 8.4 mg/dL — ABNORMAL LOW (ref 8.9–10.3)
Creatinine, Ser: 0.57 mg/dL — ABNORMAL LOW (ref 0.61–1.24)
Glucose, Bld: 94 mg/dL (ref 65–99)
POTASSIUM: 4 mmol/L (ref 3.5–5.1)
Sodium: 134 mmol/L — ABNORMAL LOW (ref 135–145)
TOTAL PROTEIN: 6.2 g/dL — AB (ref 6.5–8.1)
Total Bilirubin: 0.7 mg/dL (ref 0.3–1.2)

## 2015-06-05 LAB — VANCOMYCIN, TROUGH: VANCOMYCIN TR: 12 ug/mL (ref 10.0–20.0)

## 2015-06-05 MED ORDER — VANCOMYCIN HCL IN DEXTROSE 750-5 MG/150ML-% IV SOLN
750.0000 mg | Freq: Three times a day (TID) | INTRAVENOUS | Status: DC
  Administered 2015-06-06: 750 mg via INTRAVENOUS
  Filled 2015-06-05 (×3): qty 150

## 2015-06-05 NOTE — Progress Notes (Signed)
2 Days Post-Op Procedure(s) (LRB): VIDEO ASSISTED THORACOSCOPY (VATS)/EMPYEMA (Right) DECORTICATION (Right) Subjective: Less pain today, still has a cough   Objective: Vital signs in last 24 hours: Temp:  [98 F (36.7 C)-98.6 F (37 C)] 98.4 F (36.9 C) (05/28 0816) Pulse Rate:  [72-106] 82 (05/28 0700) Cardiac Rhythm:  [-] Normal sinus rhythm (05/28 0400) Resp:  [16-27] 20 (05/28 0700) BP: (124-185)/(80-93) 141/93 mmHg (05/28 0700) SpO2:  [92 %-100 %] 100 % (05/28 0748) Arterial Line BP: (85-113)/(69-84) 85/69 mmHg (05/27 1100) Weight:  [121 lb 4.1 oz (55 kg)] 121 lb 4.1 oz (55 kg) (05/28 0400)  Hemodynamic parameters for last 24 hours:    Intake/Output from previous day: 05/27 0701 - 05/28 0700 In: 2570 [P.O.:720; I.V.:1250; IV Piggyback:600] Out: 3755 [Urine:3545; Chest Tube:210] Intake/Output this shift:    General appearance: alert, cooperative and no distress Neurologic: intact Heart: regular rate and rhythm Lungs: diminished breath sounds bilaterally Abdomen: normal findings: soft, non-tender no air leak, serous drainage from CT  Lab Results:  Recent Labs  06/04/15 0411 06/05/15 0448  WBC 19.6* 19.1*  HGB 8.2* 8.8*  HCT 25.0* 27.3*  PLT 551* 609*   BMET:  Recent Labs  06/04/15 0411 06/05/15 0448  NA 136 134*  K 4.3 4.0  CL 107 104  CO2 22 22  GLUCOSE 100* 94  BUN 6 5*  CREATININE 0.65 0.57*  CALCIUM 7.9* 8.4*    PT/INR:  Recent Labs  06/02/15 1915  LABPROT 14.2  INR 1.08   ABG    Component Value Date/Time   PHART 7.420 06/04/2015 0355   HCO3 21.5 06/04/2015 0355   TCO2 22.5 06/04/2015 0355   ACIDBASEDEF 2.4* 06/04/2015 0355   O2SAT 94.7 06/04/2015 0355   CBG (last 3)   Recent Labs  06/04/15 0828  GLUCAP 89    Assessment/Plan: S/P Procedure(s) (LRB): VIDEO ASSISTED THORACOSCOPY (VATS)/EMPYEMA (Right) DECORTICATION (Right) -  CV- stable  RESP- s/p decortication for empyema  On vancomycin and cefipime  Continue dulera  and PRN albuterol, dc standing xopenex  IS, flutter  No air leak, minimal drainage dc diaphragmatic CT  RENAL- dc Foley  ENDO- CBG normal  DVT prophylaxis- SCD + enoxaparin  Advance diet  Ambulate  To step down when bed available   LOS: 4 days    Loreli SlotSteven C Lita Flynn 06/05/2015

## 2015-06-05 NOTE — Progress Notes (Signed)
ANTIBIOTIC CONSULT NOTE   Pharmacy Consult for Vanco/Cefepime Indication: Empyema, PNA  No Known Allergies  Patient Measurements: Height: 5\' 9"  (175.3 cm) Weight: 123 lb 3.8 oz (55.9 kg) IBW/kg (Calculated) : 70.7 Adjusted Body Weight:    Vital Signs: Temp: 98.3 F (36.8 C) (05/28 1950) Temp Source: Oral (05/28 1950) BP: 160/93 mmHg (05/28 1955) Pulse Rate: 82 (05/28 1955) Intake/Output from previous day: 05/27 0701 - 05/28 0700 In: 2570 [P.O.:720; I.V.:1250; IV Piggyback:600] Out: 3755 [Urine:3545; Chest Tube:210] Intake/Output from this shift: Total I/O In: 160 [P.O.:120; I.V.:40] Out: 200 [Urine:200]  Labs:  Recent Labs  06/03/15 0437  06/03/15 0941 06/04/15 0411 06/05/15 0448  WBC 14.1*  --   --  19.6* 19.1*  HGB 10.0*  < > 10.2* 8.2* 8.8*  PLT 778*  --   --  551* 609*  CREATININE 0.67  --   --  0.65 0.57*  < > = values in this interval not displayed. Estimated Creatinine Clearance: 82.5 mL/min (by C-G formula based on Cr of 0.57).  Recent Labs  06/04/15 1120 06/05/15 2048  VANCOTROUGH 11 12     Microbiology:   Medical History: Past Medical History  Diagnosis Date  . Alcoholic (HCC)   . Tobacco abuse    Assessment:  ID: Diagnosed with CAP prior to admission, finished Augmentin and prescribed levofloxacin - started 5/23. WBC 14.1>19.1, afeb. VATS 5/26. CXR shows hydropneumothorax/empyema. Cultures still pending  Cefepime 5/25>> Vanc 5/24>> --5/27 VT 11 (missed dose 5/26): continue same --5/28 VT 12: Incr to 750mg  IV q8hr Zosyn 5/24>>5/25  5/24: BC x 2>>NGTD 5/26: Pleural fluid: NGTD 5/26 Anerobic pleural tissue: NGTD   Goal of Therapy:  Vancomycin trough level 15-20 mcg/ml  Plan:  -Vancomycin increase to 750mg  IV q8hr.   Blakely Maranan S. Merilynn Finlandobertson, PharmD, BCPS Clinical Staff Pharmacist Pager [phone]  Misty Stanleyobertson, Lonisha Bobby Stillinger 06/05/2015,10:09 PM

## 2015-06-06 ENCOUNTER — Inpatient Hospital Stay (HOSPITAL_COMMUNITY): Payer: Self-pay

## 2015-06-06 ENCOUNTER — Encounter (HOSPITAL_COMMUNITY): Payer: Self-pay | Admitting: Thoracic Surgery (Cardiothoracic Vascular Surgery)

## 2015-06-06 LAB — BASIC METABOLIC PANEL
ANION GAP: 5 (ref 5–15)
BUN: 5 mg/dL — ABNORMAL LOW (ref 6–20)
CALCIUM: 8.4 mg/dL — AB (ref 8.9–10.3)
CO2: 24 mmol/L (ref 22–32)
CREATININE: 0.63 mg/dL (ref 0.61–1.24)
Chloride: 106 mmol/L (ref 101–111)
GLUCOSE: 98 mg/dL (ref 65–99)
Potassium: 3.7 mmol/L (ref 3.5–5.1)
Sodium: 135 mmol/L (ref 135–145)

## 2015-06-06 LAB — TYPE AND SCREEN
ABO/RH(D): A POS
ANTIBODY SCREEN: NEGATIVE
UNIT DIVISION: 0
Unit division: 0

## 2015-06-06 LAB — CBC
HCT: 24.3 % — ABNORMAL LOW (ref 39.0–52.0)
Hemoglobin: 7.9 g/dL — ABNORMAL LOW (ref 13.0–17.0)
MCH: 31.6 pg (ref 26.0–34.0)
MCHC: 32.5 g/dL (ref 30.0–36.0)
MCV: 97.2 fL (ref 78.0–100.0)
PLATELETS: 536 10*3/uL — AB (ref 150–400)
RBC: 2.5 MIL/uL — ABNORMAL LOW (ref 4.22–5.81)
RDW: 12.9 % (ref 11.5–15.5)
WBC: 15.2 10*3/uL — AB (ref 4.0–10.5)

## 2015-06-06 LAB — CULTURE, BLOOD (ROUTINE X 2)
Culture: NO GROWTH
Culture: NO GROWTH

## 2015-06-06 NOTE — Discharge Instructions (Signed)
,   Care After °Refer to this sheet in the next few weeks. These instructions provide you with information on caring for yourself after your procedure. Your caregiver may also give you more specific instructions. Your procedure has been planned according to current medical practices, but problems sometimes occur. Call your caregiver if you have any problems or questions after your procedure. °HOME CARE INSTRUCTIONS  °· Only take over-the-counter or prescription medications as directed. °· Only take pain medications (narcotics) as directed. °· Do not drive until your caregiver approves. Driving while taking narcotics or soon after surgery can be dangerous, so discuss the specific timing with your caregiver. °· Avoid activities that use your chest muscles, such as lifting heavy objects, for at least 3-4 weeks.   °· Take deep breaths to expand the lungs and to protect against pneumonia. °· Do breathing exercises as directed by your caregiver. If you were given an incentive spirometer to help with breathing, use it as directed. °· You may resume a normal diet and activities when you feel you are able to or as directed. °· Do not take a bath until your caregiver says it is OK. Use the shower instead.   °· Keep the bandage (dressing) covering the area where the chest tube was inserted (incision site) dry for 48 hours. After 48 hours, remove the dressing unless there is new drainage. °· Remove dressings as directed by your caregiver. °· Change dressings if necessary or as directed. °· Keep all follow-up appointments. It is important for you to see your caregiver after surgery to discuss appropriate follow-up care and surveillance, if it is necessary. °SEEK MEDICAL CARE: °· You feel excessive or increasing pain at an incision site. °· You notice bleeding, skin irritation, drainage, swelling, or redness at an incision site. °· There is a bad smell coming from an incision or dressing. °· It feels like your heart is fluttering  or beating rapidly. °· Your pain medication does not relieve your pain. °SEEK IMMEDIATE MEDICAL CARE IF:  °· You have a fever.   °· You have chest pain.  °· You have a rash. °· You have shortness of breath. °· You have trouble breathing.   °· You feel weak, lightheaded, dizzy, or faint.   °MAKE SURE YOU:  °· Understand these instructions.   °· Will watch your condition.   °· Will get help right away if you are not doing well or get worse. °  °This information is not intended to replace advice given to you by your health care provider. Make sure you discuss any questions you have with your health care provider. °  °Document Released: 04/21/2012 Document Revised: 01/15/2014 Document Reviewed: 04/21/2012 °Elsevier Interactive Patient Education ©2016 [employer] ° °
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
## 2015-06-06 NOTE — Progress Notes (Signed)
3 Days Post-Op Procedure(s) (LRB): VIDEO ASSISTED THORACOSCOPY (VATS)/EMPYEMA (Right) DECORTICATION (Right) Subjective: Some incisional pain, worse in the mornings  Objective: Vital signs in last 24 hours: Temp:  [97.9 F (36.6 C)-98.6 F (37 C)] 97.9 F (36.6 C) (05/29 0703) Pulse Rate:  [61-103] 75 (05/29 0703) Cardiac Rhythm:  [-] Normal sinus rhythm (05/29 0703) Resp:  [17-29] 17 (05/29 0703) BP: (138-172)/(77-100) 155/97 mmHg (05/29 0703) SpO2:  [92 %-100 %] 97 % (05/29 0836) Weight:  [123 lb 3.8 oz (55.9 kg)] 123 lb 3.8 oz (55.9 kg) (05/28 1545)  Hemodynamic parameters for last 24 hours:    Intake/Output from previous day: 05/28 0701 - 05/29 0700 In: 2469.3 [P.O.:1920; I.V.:299.3; IV Piggyback:250] Out: 3755 [Urine:3675; Chest Tube:80] Intake/Output this shift:    General appearance: alert, cooperative and no distress Neurologic: intact Heart: regular rate and rhythm Lungs: no rhonchi or wheezing Abdomen: normal findings: soft, non-tender no air leak  Lab Results:  Recent Labs  06/05/15 0448 06/06/15 0400  WBC 19.1* 15.2*  HGB 8.8* 7.9*  HCT 27.3* 24.3*  PLT 609* 536*   BMET:  Recent Labs  06/05/15 0448 06/06/15 0400  NA 134* 135  K 4.0 3.7  CL 104 106  CO2 22 24  GLUCOSE 94 98  BUN 5* 5*  CREATININE 0.57* 0.63  CALCIUM 8.4* 8.4*    PT/INR: No results for input(s): LABPROT, INR in the last 72 hours. ABG    Component Value Date/Time   PHART 7.420 06/04/2015 0355   HCO3 21.5 06/04/2015 0355   TCO2 22.5 06/04/2015 0355   ACIDBASEDEF 2.4* 06/04/2015 0355   O2SAT 94.7 06/04/2015 0355   CBG (last 3)   Recent Labs  06/04/15 0828  GLUCAP 89    Assessment/Plan: S/P Procedure(s) (LRB): VIDEO ASSISTED THORACOSCOPY (VATS)/EMPYEMA (Right) DECORTICATION (Right) -  CV- stable  RESP- s/p drainage of empyema and decortication  No air leak, drainage decreased, CXR looks good  Dc posterior CT  ID- no MRSA- will dc vanco, continue maxipime  for now.   RENAL- creatinine and lytes OK  ENDO- CBG normal  SCD + enoxaparin for DVT prophylaxis  ambulate   LOS: 5 days    Loreli SlotSteven C Dennie Warren 06/06/2015

## 2015-06-07 ENCOUNTER — Inpatient Hospital Stay (HOSPITAL_COMMUNITY): Payer: Self-pay

## 2015-06-07 NOTE — Clinical Social Work Note (Signed)
Clinical Social Work Assessment  Patient Details  Name: Brent Warren MRN: [id number] Date of Birth: Aug 21, 1960  Date of referral:  06/07/15               Reason for consult:  Substance Use/ETOH Abuse                Permission sought to share information with:    Permission granted to share information::  No  Name::        Agency::     Relationship::     Contact Information:     Housing/Transportation Living arrangements for the past 2 months:  Single Family Home Source of Information:  Patient Patient Interpreter Needed:  None Criminal Activity/Legal Involvement Pertinent to Current Situation/Hospitalization:  No - Comment as needed Significant Relationships:  Siblings Lives with:  Siblings Do you feel safe going back to the place where you live?  Yes Need for family participation in patient care:  No (Coment)  Care giving concerns:  The patient does not report any care giving concerns at this time.  Social Worker assessment / plan:  CSW met with patient at bedside to complete assessment and assess the patient's alcohol use. The patient presents with Hydropneumothorax and alcohol use disorder. The patient does not endorse any mental health history. CSW introduced self and explained reason for visit. Patient states that he has remained alcohol free for 3 weeks. He states that he is highly motivated to not drink anymore. He expresses minimal interest in treatment options but is accepting of resources. CSW provided both outpatient and residential treatment options to the patient.   CSW inquired about the patient's discharge plan. The patient states that he will be discharging back home to continue living with his brother. Prior to living with his brother the patient was homeless. He states that he plans to transition to an [facility]/half way house in the coming months. SBIRT completed with patient. CSW will sign off as the patient does not appear to have any other CSW related needs at this  time.   Employment status:  Unemployed Forensic scientist:  Self Pay (Medicaid Pending) PT Recommendations:  Not assessed at this time Information / Referral to community resources:  SBIRT, Outpatient Substance Abuse Treatment Options, Residential Substance Abuse Treatment Options, Other (Comment Required) (Outpatient and residential treatment options provided to the patient.)  Patient/Family's Response to care:  The patient appears happy with the care he has received. He is very appreciative of all the doctors that have helped him. He states that he feels blessed.  Patient/Family's Understanding of and Emotional Response to Diagnosis, Current Treatment, and Prognosis:  The patient presents with great insight into the reason for his admission. He seems motivated to continue with not drinking. He shares he has had many years of sobriety in the past. In addition to quitting drinking he wants to stop smoking. The patient appears to have a good understanding of what his post DC needs will be.   Emotional Assessment Appearance:  Appears older than stated age Attitude/Demeanor/Rapport:  Other (Patient is welcoming of CSW.) Affect (typically observed):  Accepting, Appropriate, Calm, Pleasant Orientation:  Oriented to Self, Oriented to Place, Oriented to  Time, Oriented to Situation Alcohol / Substance use:  Alcohol Use Psych involvement (Current and /or in the community):  No (Comment)  Discharge Needs  Concerns to be addressed:  Substance Abuse Concerns Readmission within the last 30 days:  No Current discharge risk:  Substance Abuse Barriers to Discharge:  Continued Medical Work up   [place], LCSW 06/07/2015, 3:11 PM

## 2015-06-07 NOTE — Care Management Note (Signed)
Case Management Note  Patient Details  Name: Brent RopesRonnie Warren MRN: [id number] Date of Birth: 02/19/1960  Subjective/Objective:     Patient is from home, s/p vats, decortication, he is ambulatory, chest tube for dc today, plan for dc to home tomorrow. NCM will cont to follow for dc needs.               Action/Plan:   Expected Discharge Date:  06/06/15               Expected Discharge Plan:  Home/Self Care  In-House Referral:     Discharge planning Services  CM Consult  Post Acute Care Choice:    Choice offered to:     DME Arranged:    DME Agency:     HH Arranged:    HH Agency:     Status of Service:  In process, will continue to follow  Medicare Important Message Given:    Date Medicare IM Given:    Medicare IM give by:    Date Additional Medicare IM Given:    Additional Medicare Important Message give by:     If discussed at Long Length of Stay Meetings, dates discussed:    Additional Comments:  Leone Havenaylor, Luisalberto Beegle Clinton, RN 06/07/2015, 5:49 PM

## 2015-06-07 NOTE — Progress Notes (Signed)
4 Days Post-Op Procedure(s) (LRB): VIDEO ASSISTED THORACOSCOPY (VATS)/EMPYEMA (Right) DECORTICATION (Right) Subjective: No complaints  Objective: Vital signs in last 24 hours: Temp:  [97.9 F (36.6 C)-98.9 F (37.2 C)] 98.4 F (36.9 C) (05/30 0723) Pulse Rate:  [80-96] 96 (05/30 0346) Cardiac Rhythm:  [-] Sinus tachycardia (05/30 0346) Resp:  [17-27] 27 (05/30 0346) BP: (143-154)/(81-99) 148/99 mmHg (05/30 0346) SpO2:  [95 %-98 %] 98 % (05/30 0346)  Hemodynamic parameters for last 24 hours:    Intake/Output from previous day: 05/29 0701 - 05/30 0700 In: 2250 [P.O.:1920; I.V.:230; IV Piggyback:100] Out: 3840 [Urine:3800; Chest Tube:40] Intake/Output this shift: Total I/O In: -  Out: 200 [Urine:200]  General appearance: alert, cooperative and no distress Neurologic: intact Heart: regular rate and rhythm Lungs: rhonchi bilaterally Wound: clean and dry + tidal movement but no air leak  Lab Results:  Recent Labs  06/05/15 0448 06/06/15 0400  WBC 19.1* 15.2*  HGB 8.8* 7.9*  HCT 27.3* 24.3*  PLT 609* 536*   BMET:  Recent Labs  06/05/15 0448 06/06/15 0400  NA 134* 135  K 4.0 3.7  CL 104 106  CO2 22 24  GLUCOSE 94 98  BUN 5* 5*  CREATININE 0.57* 0.63  CALCIUM 8.4* 8.4*    PT/INR: No results for input(s): LABPROT, INR in the last 72 hours. ABG    Component Value Date/Time   PHART 7.420 06/04/2015 0355   HCO3 21.5 06/04/2015 0355   TCO2 22.5 06/04/2015 0355   ACIDBASEDEF 2.4* 06/04/2015 0355   O2SAT 94.7 06/04/2015 0355   CBG (last 3)   Recent Labs  06/04/15 0828  GLUCAP 89    Assessment/Plan: S/P Procedure(s) (LRB): VIDEO ASSISTED THORACOSCOPY (VATS)/EMPYEMA (Right) DECORTICATION (Right) -  Only 40 ml form CT and no air leak- dc CT Continue ambulation Hopefully home in 24- 48 hours   LOS: 6 days    Loreli SlotSteven C Xzander Gilham 06/07/2015

## 2015-06-08 ENCOUNTER — Inpatient Hospital Stay (HOSPITAL_COMMUNITY): Payer: MEDICAID

## 2015-06-08 LAB — AEROBIC/ANAEROBIC CULTURE (SURGICAL/DEEP WOUND): CULTURE: NO GROWTH

## 2015-06-08 LAB — CULTURE, BODY FLUID W GRAM STAIN -BOTTLE

## 2015-06-08 LAB — CULTURE, BODY FLUID-BOTTLE: CULTURE: NO GROWTH

## 2015-06-08 LAB — AEROBIC/ANAEROBIC CULTURE W GRAM STAIN (SURGICAL/DEEP WOUND)

## 2015-06-08 MED ORDER — AMOXICILLIN-POT CLAVULANATE 875-125 MG PO TABS: 1.0000 | ORAL_TABLET | Freq: Two times a day (BID) | ORAL | Status: DC

## 2015-06-08 MED FILL — AMOX-CLAV 875-125 MG TABLET: 875-125 | 14 days supply | Qty: 28 | Fill #0

## 2015-06-08 NOTE — Clinical Social Work Note (Signed)
CSW met with patient. No supports at bedside. Confirmed need for shelter resources and provided list for [facility]. Patient will be going home with his brother today but does not know how long he will be there because his brother has a family of his own. No further needs reported.   CSW signing off. Consult if any other social work needs arise.  Dayton Scrape, [place]

## 2015-06-08 NOTE — Care Management Note (Signed)
Case Management Note  Patient Details  Name: Charleston RopesRonnie Phagan MRN: [id number] Date of Birth: 01/05/1961  Subjective/Objective:  Patient is for dc today, he is homeless, but states he is going to stay with his brother, CSW gave him a [facility], NCM called [facility] to confirm that he could get his abx there , he does not have any funds, [facility].  Patient has transportation home.                    Action/Plan:   Expected Discharge Date:  06/06/15               Expected Discharge Plan:  Home/Self Care  In-House Referral:     Discharge planning Services  CM Consult, [facility] appt scheduled[facility]  Post Acute Care Choice:    Choice offered to:     DME Arranged:    DME Agency:     HH Arranged:    HH Agency:     Status of Service:  Completed, signed off  Medicare Important Message Given:    Date Medicare IM Given:    Medicare IM give by:    Date Additional Medicare IM Given:    Additional Medicare Important Message give by:     If discussed at Long Length of Stay Meetings, dates discussed:    Additional Comments:  Leone Havenaylor, Geet Hosking Clinton, RN 06/08/2015, 10:10 AM

## 2015-06-08 NOTE — Progress Notes (Signed)
      [address]       [place] [id number]             [phone]      5 Days Post-Op Procedure(s) (LRB): VIDEO ASSISTED THORACOSCOPY (VATS)/EMPYEMA (Right) DECORTICATION (Right)   Subjective:  No complaints.  Patient states feels good and wants to go home.  Objective: Vital signs in last 24 hours: Temp:  [97.5 F (36.4 C)-98.3 F (36.8 C)] 97.9 F (36.6 C) (05/31 0726) Pulse Rate:  [70-88] 70 (05/31 0725) Cardiac Rhythm:  [-] Normal sinus rhythm (05/31 0745) Resp:  [12-22] 15 (05/31 0725) BP: (132-161)/(81-99) 143/99 mmHg (05/31 0725) SpO2:  [96 %-100 %] 99 % (05/31 0725)  Intake/Output from previous day: 05/30 0701 - 05/31 0700 In: 880 [P.O.:720; I.V.:110; IV Piggyback:50] Out: 2900 [Urine:2900] Intake/Output this shift: Total I/O In: -  Out: 350 [Urine:350]  General appearance: alert, cooperative and no distress Heart: regular rate and rhythm Lungs: rhonchi bilaterally Abdomen: soft, non-tender; bowel sounds normal; no masses,  no organomegaly Wound: clean and dry  Lab Results:  Recent Labs  06/06/15 0400  WBC 15.2*  HGB 7.9*  HCT 24.3*  PLT 536*   BMET:  Recent Labs  06/06/15 0400  NA 135  K 3.7  CL 106  CO2 24  GLUCOSE 98  BUN 5*  CREATININE 0.63  CALCIUM 8.4*    PT/INR: No results for input(s): LABPROT, INR in the last 72 hours. ABG    Component Value Date/Time   PHART 7.420 06/04/2015 0355   HCO3 21.5 06/04/2015 0355   TCO2 22.5 06/04/2015 0355   ACIDBASEDEF 2.4* 06/04/2015 0355   O2SAT 94.7 06/04/2015 0355   CBG (last 3)  No results for input(s): GLUCAP in the last 72 hours.  Assessment/Plan: S/P Procedure(s) (LRB): VIDEO ASSISTED THORACOSCOPY (VATS)/EMPYEMA (Right) DECORTICATION (Right)  1. Pulm- stable, off oxygen, CXR remain stable in appearance 2. ID- empyema, remains afebrile, will need oral antibiotics at discharge 3. Dispo- patient has been set up [facility],  they will provide him free RX for Augmentin, will plan to d/c home today   LOS: 7 days    Raford PitcherBARRETT, [facility] InstituteERIN 06/08/2015

## 2015-06-08 NOTE — Progress Notes (Signed)
Discharge instructions given to patient all questions answered at this time.  [facility]  Pt. VSS with no s/s of distress noted.  Patient stable at discharge.

## 2015-06-09 MED FILL — BENZONATATE 100 MG CAPSULE: 100 | 7 days supply | Qty: 20 | Fill #0

## 2015-06-09 MED FILL — levoFLOXacin 500 MG TABS: 500 | 7 days supply | Qty: 7 | Fill #0

## 2015-06-09 MED FILL — SYMBICORT 80-4.5 MCG INH: 80-4.5 | 30 days supply | Qty: 10 | Fill #0

## 2015-06-09 MED FILL — ALBUTEROL 0.083% INHAL SOLN: (2.5 MG/3ML | 15 days supply | Qty: 180 | Fill #0

## 2015-06-20 ENCOUNTER — Other Ambulatory Visit: Payer: Self-pay | Admitting: Thoracic Surgery (Cardiothoracic Vascular Surgery)

## 2015-06-20 ENCOUNTER — Ambulatory Visit: Payer: Self-pay | Attending: Internal Medicine | Admitting: Internal Medicine

## 2015-06-20 ENCOUNTER — Encounter: Payer: Self-pay | Admitting: Internal Medicine

## 2015-06-20 VITALS — BP 161/99 | HR 85 | Temp 97.9°F | Resp 14 | Ht 69.0 in | Wt 124.6 lb

## 2015-06-20 DIAGNOSIS — F1021 Alcohol dependence, in remission: Secondary | ICD-10-CM | POA: Insufficient documentation

## 2015-06-20 DIAGNOSIS — J869 Pyothorax without fistula: Secondary | ICD-10-CM | POA: Insufficient documentation

## 2015-06-20 DIAGNOSIS — K921 Melena: Secondary | ICD-10-CM | POA: Insufficient documentation

## 2015-06-20 DIAGNOSIS — J90 Pleural effusion, not elsewhere classified: Secondary | ICD-10-CM

## 2015-06-20 DIAGNOSIS — Z79899 Other long term (current) drug therapy: Secondary | ICD-10-CM | POA: Insufficient documentation

## 2015-06-20 DIAGNOSIS — I10 Essential (primary) hypertension: Secondary | ICD-10-CM | POA: Insufficient documentation

## 2015-06-20 DIAGNOSIS — D62 Acute posthemorrhagic anemia: Secondary | ICD-10-CM | POA: Insufficient documentation

## 2015-06-20 DIAGNOSIS — R195 Other fecal abnormalities: Secondary | ICD-10-CM

## 2015-06-20 DIAGNOSIS — Z87891 Personal history of nicotine dependence: Secondary | ICD-10-CM | POA: Insufficient documentation

## 2015-06-20 DIAGNOSIS — Z9889 Other specified postprocedural states: Secondary | ICD-10-CM | POA: Insufficient documentation

## 2015-06-20 LAB — CBC WITH DIFFERENTIAL/PLATELET
BASOS ABS: 100 {cells}/uL (ref 0–200)
Basophils Relative: 1 %
EOS PCT: 6 %
Eosinophils Absolute: 600 cells/uL — ABNORMAL HIGH (ref 15–500)
HEMATOCRIT: 30.7 % — AB (ref 38.5–50.0)
HEMOGLOBIN: 10 g/dL — AB (ref 13.2–17.1)
LYMPHS ABS: 2600 {cells}/uL (ref 850–3900)
Lymphocytes Relative: 26 %
MCH: 32.7 pg (ref 27.0–33.0)
MCHC: 32.6 g/dL (ref 32.0–36.0)
MCV: 100.3 fL — ABNORMAL HIGH (ref 80.0–100.0)
MONO ABS: 700 {cells}/uL (ref 200–950)
MPV: 9.8 fL (ref 7.5–12.5)
Monocytes Relative: 7 %
NEUTROS ABS: 6000 {cells}/uL (ref 1500–7800)
NEUTROS PCT: 60 %
Platelets: 564 10*3/uL — ABNORMAL HIGH (ref 140–400)
RBC: 3.06 MIL/uL — ABNORMAL LOW (ref 4.20–5.80)
RDW: 15 % (ref 11.0–15.0)
WBC: 10 10*3/uL (ref 3.8–10.8)

## 2015-06-20 LAB — BASIC METABOLIC PANEL WITH GFR
BUN: 15 mg/dL (ref 7–25)
CHLORIDE: 97 mmol/L — AB (ref 98–110)
CO2: 24 mmol/L (ref 20–31)
CREATININE: 0.75 mg/dL (ref 0.70–1.33)
Calcium: 9.6 mg/dL (ref 8.6–10.3)
GFR, Est African American: >89 mL/min (ref >=60)
GLUCOSE: 85 mg/dL (ref 65–99)
Potassium: 4.9 mmol/L (ref 3.5–5.3)
SODIUM: 136 mmol/L (ref 135–146)

## 2015-06-20 LAB — IRON,TIBC AND FERRITIN PANEL
%SAT: 31 % (ref 15–60)
FERRITIN: 679 ng/mL — AB (ref 20–380)
IRON: 87 ug/dL (ref 50–180)
TIBC: 279 ug/dL (ref 250–425)

## 2015-06-20 MED ORDER — AMLODIPINE BESYLATE 5 MG PO TABS
5.0000 mg | ORAL_TABLET | Freq: Every day | ORAL | Status: DC
Start: 2015-06-20 — End: 2015-08-01

## 2015-06-20 MED ORDER — DICLOFENAC SODIUM 1 % TD GEL: 2.0000 g | Freq: Four times a day (QID) | TRANSDERMAL | Status: DC

## 2015-06-20 MED FILL — VOLTAREN 1% GEL: 1 | 12 days supply | Qty: 100 | Fill #0

## 2015-06-20 MED FILL — ?AMLODIPINE BESYLATE 5 MG T: 5 | 30 days supply | Qty: 30 | Fill #0

## 2015-06-20 NOTE — Progress Notes (Signed)
Brent Warren, is a 55 y.o. male  ZOX:[id number]  WJX:[id number]  DOB - 05/10/1960  Chief Complaint  Patient presents with  . Hospitalization Follow-up        Subjective:   Brent Warren is a 55 y.o. male here today for a follow up visit from recent hospitalization for empyema on 5/24-5/31/17.  Pt was found to have loculated right pleural effusion, right lung empyema, and CAP.  Pt s/p Right VATS, drainage of empyeme, and decortication of visceral and parietal pleural by Dr Dorris FetchHendrickson CTS on 06/03/15.  Since than, he is doing very well. C/o of mild surgical suture pain, tylenol not helping.  Also c/o of large right "mass" near surgical site post op.  He has since stopped smoking and drinking since recent hospitalization, and feels much better overall.  He states he has been helping his brother with yard work.  He also mentions incidentally of dark, black stools.  But denies brbpr, melena.  No abd pain.   Patient has No headache, No chest pain, No abdominal pain - No Nausea, No new weakness tingling or numbness, No Cough - SOB.  No problems updated.  ALLERGIES: No Known Allergies  PAST MEDICAL HISTORY: Past Medical History  Diagnosis Date  . Alcoholic (HCC)   . Tobacco abuse     MEDICATIONS AT HOME: Prior to Admission medications   Medication Sig Start Date End Date Taking? Authorizing Provider  acetaminophen (TYLENOL) 500 MG tablet Take 1,000 mg by mouth every 6 (six) hours as needed for moderate pain.   Yes Historical Provider, MD  albuterol (PROVENTIL) (2.5 MG/3ML) 0.083% nebulizer solution Take 3 mLs (2.5 mg total) by nebulization every 6 (six) hours as needed for wheezing or shortness of breath. 05/31/15  Yes Pete Glatterawn T Jaysa Kise, MD  amoxicillin-clavulanate (AUGMENTIN) 875-125 MG tablet Take 1 tablet by mouth 2 (two) times daily. For 14 days 06/08/15  Yes Erin R Barrett, PA-C  benzonatate (TESSALON PERLES) 100 MG capsule Take 1 capsule (100 mg total) by mouth 3 (three) times  daily as needed for cough. 05/31/15  Yes Pete Glatterawn T Mahalie Kanner, MD  budesonide-formoterol (SYMBICORT) 80-4.5 MCG/ACT inhaler Inhale 2 puffs into the lungs 2 (two) times daily. 05/31/15  Yes Pete Glatterawn T Geneve Kimpel, MD  Dextromethorphan Polistirex (DELSYM PO) Take 5-10 mLs by mouth every 12 (twelve) hours as needed.   Yes Historical Provider, MD  levofloxacin (LEVAQUIN) 500 MG tablet Take 500 mg by mouth daily.   Yes Historical Provider, MD  amLODipine (NORVASC) 5 MG tablet Take 1 tablet (5 mg total) by mouth daily. 06/20/15   Pete Glatterawn T Rogers Ditter, MD  diclofenac sodium (VOLTAREN) 1 % GEL Apply 2 g topically 4 (four) times daily. 06/20/15   Pete Glatterawn T Edeline Greening, MD  guaiFENesin (MUCINEX) 600 MG 12 hr tablet Take 1 tablet (600 mg total) by mouth 2 (two) times daily. Patient not taking: Reported on 06/20/2015 05/31/15   Pete Glatterawn T Corinn Stoltzfus, MD  Triprolidine-Pseudoephedrine (ANTIHISTAMINE DECONGESTANT PO) Take 1 tablet by mouth daily as needed (for allergies). Reported on 06/20/2015    Historical Provider, MD     Objective:   Filed Vitals:   06/20/15 1435 06/20/15 1436  BP:  161/99  Pulse:  85  Temp:  97.9 F (36.6 C)  TempSrc:  Oral  Resp:  14  Height: 5\' 9"  (1.753 m)   Weight: 124 lb 9.6 oz (56.518 kg)   SpO2:  97%    Exam General appearance : Awake, alert, not in any distress. Speech Clear. Not toxic  looking, looks great compared to prior hospitalization! HEENT: Atraumatic and Normocephalic, pupils equally reactive to light. Neck: supple, no JVD. No cervical lymphadenopathy.  Chest:Good air entry bilaterally, decrease breath sounds at rll, no added sounds.  Right soft mass vs fluid collection about 2 inches in diameter above 1 of the sutures, not certain if fat pad vs fluid collection, no induration, nttp.  Sutures all c/d/i, healing well, no serosanguinous drainage noted. CVS: S1 S2 regular, no murmurs/gallups or rubs. Abdomen: Bowel sounds active, Non tender . Extremities: B/L Lower Ext shows no edema, both  legs are warm to touch Neurology: Awake alert, and oriented X 3, CN II-XII grossly intact, Non focal Skin:No Rash  Data Review No results found for: HGBA1C  Depression screen PHQ 2/9 06/20/2015  Decreased Interest 0  Down, Depressed, Hopeless 0  PHQ - 2 Score 0      Assessment & Plan   1. Empyema lung (HCC), recent CAP Sp VATS by Dr Dorris Fetch 06/03/15, has f/u cxr and f/u appt w/ DR Dorris Fetch tomorrow.  2. Essential hypertension, uncontrolled, high last time as well. - SALT restrictions discussed, pt eats out a lot, does not watch salt - BASIC METABOLIC PANEL WITH GFR - norvasc 5 mg qday for now.  3. Dark stools, no abd pain. - due for colonoscopy. - Ambulatory referral to Gastroenterology - csope. - CBC with Differential  4. Acute blood loss anemia Hx of recent anemia, postsurg. - chk cbc today. - Iron, TIBC and Ferritin Panel     Patient have been counseled extensively about nutrition and exercise  Return in about 4 weeks (around 07/18/2015) for dark stools/htn.  The patient was given clear instructions to go to ER or return to medical center if symptoms don't improve, worsen or new problems develop. The patient verbalized understanding. The patient was told to call to get lab results if they haven't heard anything in the next week.    Pete Glatter, MD, MBA/MHA [facility] and [facility] [place], [place] [phone]   06/20/2015, 3:50 PM

## 2015-06-20 NOTE — Progress Notes (Signed)
Pt here for HFU for surgery. Pt reports pain on right side of chest in area of surgery rated at a 8 described as sharp. Pain is constant and has been present over months.

## 2015-06-20 NOTE — Patient Instructions (Signed)
Low-Sodium Eating Plan Sodium raises blood pressure and causes water to be held in the body. Getting less sodium from food will help lower your blood pressure, reduce any swelling, and protect your heart, liver, and kidneys. We get sodium by adding salt (sodium chloride) to food. Most of our sodium comes from canned, boxed, and frozen foods. Restaurant foods, fast foods, and pizza are also very high in sodium. Even if you take medicine to lower your blood pressure or to reduce fluid in your body, getting less sodium from your food is important. WHAT IS MY PLAN? Most people should limit their sodium intake to 2,300 mg a day. Your health care provider recommends that you limit your sodium intake to __________ a day.  WHAT DO I NEED TO KNOW ABOUT THIS EATING PLAN? For the low-sodium eating plan, you will follow these general guidelines:  Choose foods with a % Daily Value for sodium of less than 5% (as listed on the food label).   Use salt-free seasonings or herbs instead of table salt or sea salt.   Check with your health care provider or pharmacist before using salt substitutes.   Eat fresh foods.  Eat more vegetables and fruits.  Limit canned vegetables. If you do use them, rinse them well to decrease the sodium.   Limit cheese to 1 oz (28 g) per day.   Eat lower-sodium products, often labeled as "lower sodium" or "no salt added."  Avoid foods that contain monosodium glutamate (MSG). MSG is sometimes added to [place] food and some canned foods.  Check food labels (Nutrition Facts labels) on foods to learn how much sodium is in one serving.  Eat more home-cooked food and less restaurant, buffet, and fast food.  When eating at a restaurant, ask that your food be prepared with less salt, or no salt if possible.  HOW DO I READ FOOD LABELS FOR SODIUM INFORMATION? The Nutrition Facts label lists the amount of sodium in one serving of the food. If you eat more than one serving, you  must multiply the listed amount of sodium by the number of servings. Food labels may also identify foods as:  Sodium free--Less than 5 mg in a serving.  Very low sodium--35 mg or less in a serving.  Low sodium--140 mg or less in a serving.  Light in sodium--50% less sodium in a serving. For example, if a food that usually has 300 mg of sodium is changed to become light in sodium, it will have 150 mg of sodium.  Reduced sodium--25% less sodium in a serving. For example, if a food that usually has 400 mg of sodium is changed to reduced sodium, it will have 300 mg of sodium. WHAT FOODS CAN I EAT? Grains Low-sodium cereals, including oats, puffed wheat and rice, and shredded wheat cereals. Low-sodium crackers. Unsalted rice and pasta. Lower-sodium bread.  Vegetables Frozen or fresh vegetables. Low-sodium or reduced-sodium canned vegetables. Low-sodium or reduced-sodium tomato sauce and paste. Low-sodium or reduced-sodium tomato and vegetable juices.  Fruits Fresh, frozen, and canned fruit. Fruit juice.  Meat and Other Protein Products Low-sodium canned tuna and salmon. Fresh or frozen meat, poultry, seafood, and fish. Lamb. Unsalted nuts. Dried beans, peas, and lentils without added salt. Unsalted canned beans. Homemade soups without salt. Eggs.  Dairy Milk. Soy milk. Ricotta cheese. Low-sodium or reduced-sodium cheeses. Yogurt.  Condiments Fresh and dried herbs and spices. Salt-free seasonings. Onion and garlic powders. Low-sodium varieties of mustard and ketchup. Fresh or refrigerated horseradish. [place]  juice.  Fats and Oils Reduced-sodium salad dressings. Unsalted butter.  Other Unsalted popcorn and pretzels.  The items listed above may not be a complete list of recommended foods or beverages. Contact your dietitian for more options. WHAT FOODS ARE NOT RECOMMENDED? Grains Instant hot cereals. Bread stuffing, pancake, and biscuit mixes. Croutons. Seasoned rice or pasta mixes.  Noodle soup cups. Boxed or frozen macaroni and cheese. Self-rising flour. Regular salted crackers. Vegetables Regular canned vegetables. Regular canned tomato sauce and paste. Regular tomato and vegetable juices. Frozen vegetables in sauces. Salted [place] fries. Olives. [place]. Relishes. Sauerkraut. Salsa. Meat and Other Protein Products Salted, canned, smoked, spiced, or pickled meats, seafood, or fish. Bacon, ham, sausage, hot dogs, corned beef, chipped beef, and packaged luncheon meats. Salt pork. Jerky. Pickled herring. Anchovies, regular canned tuna, and sardines. Salted nuts. Dairy Processed cheese and cheese spreads. Cheese curds. Blue cheese and cottage cheese. Buttermilk.  Condiments Onion and garlic salt, seasoned salt, table salt, and sea salt. Canned and packaged gravies. Worcestershire sauce. Tartar sauce. Barbecue sauce. Teriyaki sauce. Soy sauce, including reduced sodium. Steak sauce. Fish sauce. Oyster sauce. Cocktail sauce. Horseradish that you find on the shelf. Regular ketchup and mustard. Meat flavorings and tenderizers. Bouillon cubes. Hot sauce. Tabasco sauce. Marinades. Taco seasonings. Relishes. Fats and Oils Regular salad dressings. Salted butter. Margarine. Ghee. Bacon fat.  Other Potato and tortilla chips. Corn chips and puffs. Salted popcorn and pretzels. Canned or dried soups. Pizza. Frozen entrees and pot pies.  The items listed above may not be a complete list of foods and beverages to avoid. Contact your dietitian for more information.   This information is not intended to replace advice given to you by your health care provider. Make sure you discuss any questions you have with your health care provider.   Document Released: 06/16/2001 Document Revised: 01/15/2014 Document Reviewed: 10/29/2012 Elsevier Interactive Patient Education 2016 Elsevier Inc.  -  Heart-Healthy Eating Plan Heart-healthy meal planning includes:  Limiting unhealthy  fats.  Increasing healthy fats.  Making other small dietary changes. You may need to talk with your doctor or a diet specialist (dietitian) to create an eating plan that is right for you. WHAT TYPES OF FAT SHOULD I CHOOSE?  Choose healthy fats. These include olive oil and canola oil, flaxseeds, walnuts, almonds, and seeds.  Eat more omega-3 fats. These include salmon, mackerel, sardines, tuna, flaxseed oil, and ground flaxseeds. Try to eat fish at least twice each week.  Limit saturated fats.  Saturated fats are often found in animal products, such as meats, butter, and cream.  Plant sources of saturated fats include palm oil, palm kernel oil, and coconut oil.  Avoid foods with partially hydrogenated oils in them. These include stick margarine, some tub margarines, cookies, crackers, and other baked goods. These contain trans fats. WHAT GENERAL GUIDELINES DO I NEED TO FOLLOW?  Check food labels carefully. Identify foods with trans fats or high amounts of saturated fat.  Fill one half of your plate with vegetables and green salads. Eat 4-5 servings of vegetables per day. A serving of vegetables is:  1 cup of raw leafy vegetables.   cup of raw or cooked cut-up vegetables.   cup of vegetable juice.  Fill one fourth of your plate with whole grains. Look for the word "whole" as the first word in the ingredient list.  Fill one fourth of your plate with lean protein foods.  Eat 4-5 servings of fruit per day. A serving of fruit is:  One medium whole fruit.   cup of dried fruit.   cup of fresh, frozen, or canned fruit.   cup of 100% fruit juice.  Eat more foods that contain soluble fiber. These include apples, broccoli, carrots, beans, peas, and barley. Try to get 20-30 g of fiber per day.  Eat more home-cooked food. Eat less restaurant, buffet, and fast food.  Limit or avoid alcohol.  Limit foods high in starch and sugar.  Avoid fried foods.  Avoid frying your  food. Try baking, boiling, grilling, or broiling it instead. You can also reduce fat by:  Removing the skin from poultry.  Removing all visible fats from meats.  Skimming the fat off of stews, soups, and gravies before serving them.  Steaming vegetables in water or broth.  Lose weight if you are overweight.  Eat 4-5 servings of nuts, legumes, and seeds per week:  One serving of dried beans or legumes equals  cup after being cooked.  One serving of nuts equals 1 ounces.  One serving of seeds equals  ounce or one tablespoon.  You may need to keep track of how much salt or sodium you eat. This is especially true if you have high blood pressure. Talk with your doctor or dietitian to get more information. WHAT FOODS CAN I EAT? Grains Breads, including [place], white, pita, wheat, raisin, rye, oatmeal, and [place]. Tortillas that are neither fried nor made with lard or trans fat. Low-fat rolls, including hotdog and hamburger buns and English muffins. Biscuits. Muffins. Waffles. Pancakes. Light popcorn. Whole-grain cereals. Flatbread. Melba toast. Pretzels. Breadsticks. Rusks. Low-fat snacks. Low-fat crackers, including oyster, saltine, matzo, graham, animal, and rye. Rice and pasta, including brown rice and pastas that are made with whole wheat.  Vegetables All vegetables.  Fruits All fruits, but limit coconut. Meats and Other Protein Sources Lean, well-trimmed beef, veal, pork, and lamb. Chicken and [place] without skin. All fish and shellfish. Wild duck, rabbit, pheasant, and venison. Egg whites or low-cholesterol egg substitutes. Dried beans, peas, lentils, and tofu. Seeds and most nuts. Dairy Low-fat or nonfat cheeses, including ricotta, string, and mozzarella. Skim or 1% milk that is liquid, powdered, or evaporated. Buttermilk that is made with low-fat milk. Nonfat or low-fat yogurt. Beverages Mineral water. Diet carbonated beverages. Sweets and Desserts Sherbets and fruit ices.  Honey, jam, marmalade, jelly, and syrups. Meringues and gelatins. Pure sugar candy, such as hard candy, jelly beans, gumdrops, mints, marshmallows, and small amounts of dark chocolate. [employer] food cake. Eat all sweets and desserts in moderation. Fats and Oils Nonhydrogenated (trans-free) margarines. Vegetable oils, including soybean, sesame, sunflower, olive, peanut, safflower, corn, canola, and cottonseed. Salad dressings or mayonnaise made with a vegetable oil. Limit added fats and oils that you use for cooking, baking, salads, and as spreads. Other Cocoa powder. Coffee and tea. All seasonings and condiments. The items listed above may not be a complete list of recommended foods or beverages. Contact your dietitian for more options. WHAT FOODS ARE NOT RECOMMENDED? Grains Breads that are made with saturated or trans fats, oils, or whole milk. Croissants. Butter rolls. Cheese breads. Sweet rolls. Donuts. Buttered popcorn. Chow mein noodles. High-fat crackers, such as cheese or butter crackers. Meats and Other Protein Sources Fatty meats, such as hotdogs, short ribs, sausage, spareribs, bacon, rib eye roast or steak, and mutton. High-fat deli meats, such as salami and bologna. Caviar. Domestic duck and goose. Organ meats, such as kidney, liver, sweetbreads, and heart. Dairy Cream, sour cream, cream cheese, and  creamed cottage cheese. Whole-milk cheeses, including blue (bleu), [address], [place], [place], [address], [place], [address], cheddar, [place], and [place]. Whole or 2% milk that is liquid, evaporated, or condensed. Whole buttermilk. Cream sauce or high-fat cheese sauce. Yogurt that is made from whole milk. Beverages Regular sodas and juice drinks with added sugar. Sweets and Desserts Frosting. Pudding. Cookies. Cakes other than angel food cake. Candy that has milk chocolate or white chocolate, hydrogenated fat, butter, coconut, or unknown ingredients. Buttered syrups. Full-fat ice cream or ice  cream drinks. Fats and Oils Gravy that has suet, meat fat, or shortening. Cocoa butter, hydrogenated oils, palm oil, coconut oil, palm kernel oil. These can often be found in baked products, candy, fried foods, nondairy creamers, and whipped toppings. Solid fats and shortenings, including bacon fat, salt pork, lard, and butter. Nondairy cream substitutes, such as coffee creamers and sour cream substitutes. Salad dressings that are made of unknown oils, cheese, or sour cream. The items listed above may not be a complete list of foods and beverages to avoid. Contact your dietitian for more information.   This information is not intended to replace advice given to you by your health care provider. Make sure you discuss any questions you have with your health care provider.   Document Released: 06/26/2011 Document Revised: 01/15/2014 Document Reviewed: 06/18/2013 Elsevier Interactive Patient Education 2016 Elsevier Inc.  - DASH Eating Plan DASH stands for "Dietary Approaches to Stop Hypertension." The DASH eating plan is a healthy eating plan that has been shown to reduce high blood pressure (hypertension). Additional health benefits may include reducing the risk of type 2 diabetes mellitus, heart disease, and stroke. The DASH eating plan may also help with weight loss. WHAT DO I NEED TO KNOW ABOUT THE DASH EATING PLAN? For the DASH eating plan, you will follow these general guidelines:  Choose foods with a percent daily value for sodium of less than 5% (as listed on the food label).  Use salt-free seasonings or herbs instead of table salt or sea salt.  Check with your health care provider or pharmacist before using salt substitutes.  Eat lower-sodium products, often labeled as "lower sodium" or "no salt added."  Eat fresh foods.  Eat more vegetables, fruits, and low-fat dairy products.  Choose whole grains. Look for the word "whole" as the first word in the ingredient list.  Choose fish  and skinless chicken or [place] more often than red meat. Limit fish, poultry, and meat to 6 oz (170 g) each day.  Limit sweets, desserts, sugars, and sugary drinks.  Choose heart-healthy fats.  Limit cheese to 1 oz (28 g) per day.  Eat more home-cooked food and less restaurant, buffet, and fast food.  Limit fried foods.  Cook foods using methods other than frying.  Limit canned vegetables. If you do use them, rinse them well to decrease the sodium.  When eating at a restaurant, ask that your food be prepared with less salt, or no salt if possible. WHAT FOODS CAN I EAT? Seek help from a dietitian for individual calorie needs. Grains Whole grain or whole wheat bread. Brown rice. Whole grain or whole wheat pasta. Quinoa, bulgur, and whole grain cereals. Low-sodium cereals. Corn or whole wheat flour tortillas. Whole grain cornbread. Whole grain crackers. Low-sodium crackers. Vegetables Fresh or frozen vegetables (raw, steamed, roasted, or grilled). Low-sodium or reduced-sodium tomato and vegetable juices. Low-sodium or reduced-sodium tomato sauce and paste. Low-sodium or reduced-sodium canned vegetables.  Fruits All fresh, canned (in natural juice),  or frozen fruits. Meat and Other Protein Products Ground beef (85% or leaner), grass-fed beef, or beef trimmed of fat. Skinless chicken or [place]. Ground chicken or [place]. Pork trimmed of fat. All fish and seafood. Eggs. Dried beans, peas, or lentils. Unsalted nuts and seeds. Unsalted canned beans. Dairy Low-fat dairy products, such as skim or 1% milk, 2% or reduced-fat cheeses, low-fat ricotta or cottage cheese, or plain low-fat yogurt. Low-sodium or reduced-sodium cheeses. Fats and Oils Tub margarines without trans fats. Light or reduced-fat mayonnaise and salad dressings (reduced sodium). Avocado. Safflower, olive, or canola oils. Natural peanut or almond butter. Other Unsalted popcorn and pretzels. The items listed above may not be a  complete list of recommended foods or beverages. Contact your dietitian for more options. WHAT FOODS ARE NOT RECOMMENDED? Grains White bread. White pasta. White rice. Refined cornbread. Bagels and croissants. Crackers that contain trans fat. Vegetables Creamed or fried vegetables. Vegetables in a cheese sauce. Regular canned vegetables. Regular canned tomato sauce and paste. Regular tomato and vegetable juices. Fruits Dried fruits. Canned fruit in light or heavy syrup. Fruit juice. Meat and Other Protein Products Fatty cuts of meat. Ribs, chicken wings, bacon, sausage, bologna, salami, chitterlings, fatback, hot dogs, bratwurst, and packaged luncheon meats. Salted nuts and seeds. Canned beans with salt. Dairy Whole or 2% milk, cream, half-and-half, and cream cheese. Whole-fat or sweetened yogurt. Full-fat cheeses or blue cheese. Nondairy creamers and whipped toppings. Processed cheese, cheese spreads, or cheese curds. Condiments Onion and garlic salt, seasoned salt, table salt, and sea salt. Canned and packaged gravies. Worcestershire sauce. Tartar sauce. Barbecue sauce. Teriyaki sauce. Soy sauce, including reduced sodium. Steak sauce. Fish sauce. Oyster sauce. Cocktail sauce. Horseradish. Ketchup and mustard. Meat flavorings and tenderizers. Bouillon cubes. Hot sauce. Tabasco sauce. Marinades. Taco seasonings. Relishes. Fats and Oils Butter, stick margarine, lard, shortening, ghee, and bacon fat. Coconut, palm kernel, or palm oils. Regular salad dressings. Other Pickles and olives. Salted popcorn and pretzels. The items listed above may not be a complete list of foods and beverages to avoid. Contact your dietitian for more information. WHERE CAN I FIND MORE INFORMATION? National Heart, Lung, and Blood Institute: [employer]   This information is not intended to replace advice given to you by your health care provider. Make sure you discuss any questions  you have with your health care provider.   Document Released: 12/14/2010 Document Revised: 01/15/2014 Document Reviewed: 10/29/2012 Elsevier Interactive Patient Education [employer]

## 2015-06-21 ENCOUNTER — Encounter: Payer: Self-pay | Admitting: Thoracic Surgery (Cardiothoracic Vascular Surgery)

## 2015-06-21 ENCOUNTER — Ambulatory Visit (INDEPENDENT_AMBULATORY_CARE_PROVIDER_SITE_OTHER): Payer: Self-pay | Admitting: Thoracic Surgery (Cardiothoracic Vascular Surgery)

## 2015-06-21 ENCOUNTER — Ambulatory Visit
Admission: RE | Admit: 2015-06-21 | Discharge: 2015-06-21 | Disposition: A | Discharge status: Home / Self Care | Payer: No Typology Code available for payment source | Source: Ambulatory Visit | Attending: Thoracic Surgery (Cardiothoracic Vascular Surgery) | Admitting: Thoracic Surgery (Cardiothoracic Vascular Surgery)

## 2015-06-21 VITALS — BP 156/103 | HR 78 | Resp 16 | Ht 69.0 in | Wt 124.0 lb

## 2015-06-21 DIAGNOSIS — Z09 Encounter for follow-up examination after completed treatment for conditions other than malignant neoplasm: Secondary | ICD-10-CM

## 2015-06-21 DIAGNOSIS — J869 Pyothorax without fistula: Secondary | ICD-10-CM

## 2015-06-21 DIAGNOSIS — J90 Pleural effusion, not elsewhere classified: Secondary | ICD-10-CM

## 2015-06-21 NOTE — Progress Notes (Signed)
[address]       [place] [address]             [phone]     HPI: Mr. Terpening returns today for a scheduled postoperative follow-up visit.  He is a 55 year old man with a history of tobacco and ethanol abuse who presented with a right empyema back in May. I did right VATS drainage of the LIMA and decortication on  06/03/2015. Postoperatively he did well and went home on day 5.He has been on Augmentin. He has 2 more days left of that.  He denies any fevers or chills. He has a chronic cough. He's had minimal sputum production. He feels much better than he did prior to surgery. He does have some incisional discomfort, but is not taking narcotics.  He says he has not smoked or drank since his discharge.  Past Medical History  Diagnosis Date  . Alcoholic (HCC)   . Tobacco abuse      Current Outpatient Prescriptions  Medication Sig Dispense Refill  . acetaminophen (TYLENOL) 500 MG tablet Take 1,000 mg by mouth every 6 (six) hours as needed for moderate pain.    Marland Kitchen albuterol (PROVENTIL) (2.5 MG/3ML) 0.083% nebulizer solution Take 3 mLs (2.5 mg total) by nebulization every 6 (six) hours as needed for wheezing or shortness of breath. 150 mL 1  . amoxicillin-clavulanate (AUGMENTIN) 875-125 MG tablet Take 1 tablet by mouth 2 (two) times daily. For 14 days 28 tablet 0  . benzonatate (TESSALON PERLES) 100 MG capsule Take 1 capsule (100 mg total) by mouth 3 (three) times daily as needed for cough. 20 capsule 0  . budesonide-formoterol (SYMBICORT) 80-4.5 MCG/ACT inhaler Inhale 2 puffs into the lungs 2 (two) times daily. 1 Inhaler 3  . diclofenac sodium (VOLTAREN) 1 % GEL Apply 2 g topically 4 (four) times daily. 100 g 2  . levofloxacin (LEVAQUIN) 500 MG tablet Take 500 mg by mouth daily.    Marland Kitchen amLODipine (NORVASC) 5 MG tablet Take 1 tablet (5 mg total) by mouth daily. (Patient not taking: Reported on 06/21/2015) 90 tablet 3   No current facility-administered medications for  this visit.    Physical Exam BP 156/103 mmHg  Pulse 78  Resp 16  Ht  (1.753 m)  Wt 124 lb (56.246 kg)  BMI 18.30 kg/m2  SpO23 21% 54 year old man in no acute distress Alert and oriented 3 with no focal deficits Lungs clear with equal breath sound bilaterally Incisions healing well 3 x 2 cm mobile mass/fluid collection posterior to the incision.  Diagnostic Tests: CHEST 2 VIEW  COMPARISON: 06/08/2015  FINDINGS: Cardiac shadow is stable. The left lung remains clear. Stable changes in the right lung base are noted related to the prior surgery and scarring. No new focal abnormality is seen. No bony abnormality is noted.  IMPRESSION: Stable changes in the right lung base likely related to scarring.   Electronically Signed  By: Alcide Clever M.D.  On: 06/21/2015 10:38 I personally reviewed the chest x-ray and concur with the findings as noted above.  Impression: 55 year old man with a history of tobacco and ethanol abuse who had aright empyema  Treated with thoracoscopic drainage and decortication about 3 weeks ago. He is doing extremely well at this point in time. He will complete a 1 month course of antibiotics  On Thursday  He is having some mild incisional discomfort but is not having to take any narcotics.  He has not used  alcohol or cigarettes since discharge. I commended him for this and stressed the importance of ongoing abstinence.  He did have 2 areas of consolidation versus nodules in the base of his left lung on CT. Those are not visible on chest x-ray. He needs a follow-up CT to make sure that those resolve and are not truly nodules. We will plan to do that in about 8 weeks.  There appears to be a seroma posterior to the incision in his right chest. He thinks it was present prior to surgery. I do not recall it, nor do I see it on CT scan from 5/24. It is suspicious for a seroma, although the location is a little unusual. I attempted to aspirate it,  butgot no return. We will follow that. It could possibly be a small lipoma.  Plan: Return in 8 weeks with CT chest  Loreli SlotSteven C Ivianna Notch, MD [facility] [phone]

## 2015-06-22 ENCOUNTER — Telehealth: Payer: Self-pay | Admitting: Internal Medicine

## 2015-06-22 NOTE — Telephone Encounter (Signed)
Called patient. Pt verified name and date of birth. Pt notified that his labs are normalizing after surgery and his anemia is improving after surgery as well. Pt notified that he does not need to take iron pills. Pt voiced understanding.

## 2015-06-22 NOTE — Telephone Encounter (Signed)
-----   Message from Pete Glatterawn T Langeland, MD sent at 06/21/2015  8:33 AM EDT ----- Please call pt w/ results.  Labs look like normalizing after surgery.  Anemia improving after surgery as well. Does not need to take iron pills. thanks

## 2015-07-06 LAB — FUNGUS CULTURE RESULT

## 2015-07-06 LAB — FUNGUS CULTURE WITH STAIN

## 2015-07-06 LAB — FUNGAL ORGANISM REFLEX

## 2015-07-19 LAB — ACID FAST CULTURE WITH REFLEXED SENSITIVITIES: ACID FAST CULTURE - AFSCU3: NEGATIVE

## 2015-07-20 ENCOUNTER — Encounter: Payer: Self-pay | Admitting: Internal Medicine

## 2015-07-21 ENCOUNTER — Other Ambulatory Visit: Payer: Self-pay | Admitting: Thoracic Surgery (Cardiothoracic Vascular Surgery)

## 2015-07-21 DIAGNOSIS — R918 Other nonspecific abnormal finding of lung field: Secondary | ICD-10-CM

## 2015-08-01 ENCOUNTER — Ambulatory Visit: Payer: Self-pay | Attending: Internal Medicine | Admitting: Internal Medicine

## 2015-08-01 VITALS — BP 158/93 | HR 71 | Temp 97.7°F | Resp 16 | Wt 130.8 lb

## 2015-08-01 DIAGNOSIS — Z79899 Other long term (current) drug therapy: Secondary | ICD-10-CM | POA: Insufficient documentation

## 2015-08-01 DIAGNOSIS — I10 Essential (primary) hypertension: Secondary | ICD-10-CM

## 2015-08-01 DIAGNOSIS — D649 Anemia, unspecified: Secondary | ICD-10-CM

## 2015-08-01 DIAGNOSIS — F17210 Nicotine dependence, cigarettes, uncomplicated: Secondary | ICD-10-CM | POA: Insufficient documentation

## 2015-08-01 LAB — CBC WITH DIFFERENTIAL/PLATELET
BASOS PCT: 3 %
Basophils Absolute: 189 cells/uL (ref 0–200)
EOS ABS: 504 {cells}/uL — AB (ref 15–500)
Eosinophils Relative: 8 %
HEMATOCRIT: 34 % — AB (ref 38.5–50.0)
HEMOGLOBIN: 11.2 g/dL — AB (ref 13.2–17.1)
LYMPHS ABS: 2331 {cells}/uL (ref 850–3900)
LYMPHS PCT: 37 %
MCH: 31.7 pg (ref 27.0–33.0)
MCHC: 32.9 g/dL (ref 32.0–36.0)
MCV: 96.3 fL (ref 80.0–100.0)
MONO ABS: 819 {cells}/uL (ref 200–950)
MPV: 10.6 fL (ref 7.5–12.5)
Monocytes Relative: 13 %
Neutro Abs: 2457 cells/uL (ref 1500–7800)
Neutrophils Relative %: 39 %
Platelets: 313 10*3/uL (ref 140–400)
RBC: 3.53 MIL/uL — ABNORMAL LOW (ref 4.20–5.80)
RDW: 13.7 % (ref 11.0–15.0)
WBC: 6.3 10*3/uL (ref 3.8–10.8)

## 2015-08-01 MED ORDER — NICOTINE 7 MG/24HR TD PT24: 7.0000 mg | MEDICATED_PATCH | Freq: Every day | TRANSDERMAL | 2 refills | Status: DC

## 2015-08-01 MED ORDER — AMLODIPINE BESYLATE 5 MG PO TABS: 5.0000 mg | ORAL_TABLET | Freq: Every day | ORAL | 3 refills | Status: DC

## 2015-08-01 MED FILL — ?AMLODIPINE BESYLATE 5 MG T: 5 | 30 days supply | Qty: 30 | Fill #0

## 2015-08-01 NOTE — Patient Instructions (Addendum)
- financial aid appt     You Can Quit Smoking If you are ready to quit smoking or are thinking about it, congratulations! You have chosen to help yourself be healthier and live longer! There are lots of different ways to quit smoking. Nicotine gum, nicotine patches, a nicotine inhaler, or nicotine nasal spray can help with physical craving. Hypnosis, support groups, and medicines help break the habit of smoking. TIPS TO GET OFF AND STAY OFF CIGARETTES  Learn to predict your moods. Do not let a bad situation be your excuse to have a cigarette. Some situations in your life might tempt you to have a cigarette.  Ask friends and co-workers not to smoke around you.  Make your home smoke-free.  Never have "just one" cigarette. It leads to wanting another and another. Remind yourself of your decision to quit.  On a card, make a list of your reasons for not smoking. Read it at least the same number of times a day as you have a cigarette. Tell yourself everyday, "I do not want to smoke. I choose not to smoke."  Ask someone at home or work to help you with your plan to quit smoking.  Have something planned after you eat or have a cup of coffee. Take a walk or get other exercise to perk you up. This will help to keep you from overeating.  Try a relaxation exercise to calm you down and decrease your stress. Remember, you may be tense and nervous the first two weeks after you quit. This will pass.  Find new activities to keep your hands busy. Play with a pen, coin, or rubber band. Doodle or draw things on paper.  Brush your teeth right after eating. This will help cut down the craving for the taste of tobacco after meals. You can try mouthwash too.  Try gum, breath mints, or diet candy to keep something in your mouth. IF YOU SMOKE AND WANT TO QUIT:  Do not stock up on cigarettes. Never buy a carton. Wait until one pack is finished before you buy another.  Never carry cigarettes with you at work or  at home.  Keep cigarettes as far away from you as possible. Leave them with someone else.  Never carry matches or a lighter with you.  Ask yourself, "Do I need this cigarette or is this just a reflex?"  Bet with someone that you can quit. Put cigarette money in a piggy bank every morning. If you smoke, you give up the money. If you do not smoke, by the end of the week, you keep the money.  Keep trying. It takes 21 days to change a habit!  Talk to your doctor about using medicines to help you quit. These include nicotine replacement gum, lozenges, or skin patches.   This information is not intended to replace advice given to you by your health care provider. Make sure you discuss any questions you have with your health care provider.   Document Released: 10/21/2008 Document Revised: 03/19/2011 Document Reviewed: 10/21/2008 Elsevier Interactive Patient Education 2016 Elsevier Inc.  -  Hypertension Hypertension is another name for high blood pressure. High blood pressure forces your heart to work harder to pump blood. A blood pressure reading has two numbers, which includes a higher number over a lower number (example: 110/72). HOME CARE   Have your blood pressure rechecked by your doctor.  Only take medicine as told by your doctor. Follow the directions carefully. The medicine does not  work as well if you skip doses. Skipping doses also puts you at risk for problems.  Do not smoke.  Monitor your blood pressure at home as told by your doctor. GET HELP IF:  You think you are having a reaction to the medicine you are taking.  You have repeat headaches or feel dizzy.  You have puffiness (swelling) in your ankles.  You have trouble with your vision. GET HELP RIGHT AWAY IF:   You get a very bad headache and are confused.  You feel weak, numb, or faint.  You get chest or belly (abdominal) pain.  You throw up (vomit).  You cannot breathe very well. MAKE SURE YOU:    Understand these instructions.  Will watch your condition.  Will get help right away if you are not doing well or get worse.   This information is not intended to replace advice given to you by your health care provider. Make sure you discuss any questions you have with your health care provider.   Document Released: 06/13/2007 Document Revised: 12/30/2012 Document Reviewed: 10/17/2012 Elsevier Interactive Patient Education 2016 Elsevier Inc.  - DASH Eating Plan DASH stands for "Dietary Approaches to Stop Hypertension." The DASH eating plan is a healthy eating plan that has been shown to reduce high blood pressure (hypertension). Additional health benefits may include reducing the risk of type 2 diabetes mellitus, heart disease, and stroke. The DASH eating plan may also help with weight loss. WHAT DO I NEED TO KNOW ABOUT THE DASH EATING PLAN? For the DASH eating plan, you will follow these general guidelines:  Choose foods with a percent daily value for sodium of less than 5% (as listed on the food label).  Use salt-free seasonings or herbs instead of table salt or sea salt.  Check with your health care provider or pharmacist before using salt substitutes.  Eat lower-sodium products, often labeled as "lower sodium" or "no salt added."  Eat fresh foods.  Eat more vegetables, fruits, and low-fat dairy products.  Choose whole grains. Look for the word "whole" as the first word in the ingredient list.  Choose fish and skinless chicken or [place] more often than red meat. Limit fish, poultry, and meat to 6 oz (170 g) each day.  Limit sweets, desserts, sugars, and sugary drinks.  Choose heart-healthy fats.  Limit cheese to 1 oz (28 g) per day.  Eat more home-cooked food and less restaurant, buffet, and fast food.  Limit fried foods.  Cook foods using methods other than frying.  Limit canned vegetables. If you do use them, rinse them well to decrease the sodium.  When  eating at a restaurant, ask that your food be prepared with less salt, or no salt if possible. WHAT FOODS CAN I EAT? Seek help from a dietitian for individual calorie needs. Grains Whole grain or whole wheat bread. Brown rice. Whole grain or whole wheat pasta. Quinoa, bulgur, and whole grain cereals. Low-sodium cereals. Corn or whole wheat flour tortillas. Whole grain cornbread. Whole grain crackers. Low-sodium crackers. Vegetables Fresh or frozen vegetables (raw, steamed, roasted, or grilled). Low-sodium or reduced-sodium tomato and vegetable juices. Low-sodium or reduced-sodium tomato sauce and paste. Low-sodium or reduced-sodium canned vegetables.  Fruits All fresh, canned (in natural juice), or frozen fruits. Meat and Other Protein Products Ground beef (85% or leaner), grass-fed beef, or beef trimmed of fat. Skinless chicken or [place]. Ground chicken or [place]. Pork trimmed of fat. All fish and seafood. Eggs. Dried beans, peas, or lentils.  Unsalted nuts and seeds. Unsalted canned beans. Dairy Low-fat dairy products, such as skim or 1% milk, 2% or reduced-fat cheeses, low-fat ricotta or cottage cheese, or plain low-fat yogurt. Low-sodium or reduced-sodium cheeses. Fats and Oils Tub margarines without trans fats. Light or reduced-fat mayonnaise and salad dressings (reduced sodium). Avocado. Safflower, olive, or canola oils. Natural peanut or almond butter. Other Unsalted popcorn and pretzels. The items listed above may not be a complete list of recommended foods or beverages. Contact your dietitian for more options. WHAT FOODS ARE NOT RECOMMENDED? Grains White bread. White pasta. White rice. Refined cornbread. Bagels and croissants. Crackers that contain trans fat. Vegetables Creamed or fried vegetables. Vegetables in a cheese sauce. Regular canned vegetables. Regular canned tomato sauce and paste. Regular tomato and vegetable juices. Fruits Dried fruits. Canned fruit in light or heavy  syrup. Fruit juice. Meat and Other Protein Products Fatty cuts of meat. Ribs, chicken wings, bacon, sausage, bologna, salami, chitterlings, fatback, hot dogs, bratwurst, and packaged luncheon meats. Salted nuts and seeds. Canned beans with salt. Dairy Whole or 2% milk, cream, half-and-half, and cream cheese. Whole-fat or sweetened yogurt. Full-fat cheeses or blue cheese. Nondairy creamers and whipped toppings. Processed cheese, cheese spreads, or cheese curds. Condiments Onion and garlic salt, seasoned salt, table salt, and sea salt. Canned and packaged gravies. Worcestershire sauce. Tartar sauce. Barbecue sauce. Teriyaki sauce. Soy sauce, including reduced sodium. Steak sauce. Fish sauce. Oyster sauce. Cocktail sauce. Horseradish. Ketchup and mustard. Meat flavorings and tenderizers. Bouillon cubes. Hot sauce. Tabasco sauce. Marinades. Taco seasonings. Relishes. Fats and Oils Butter, stick margarine, lard, shortening, ghee, and bacon fat. Coconut, palm kernel, or palm oils. Regular salad dressings. Other Pickles and olives. Salted popcorn and pretzels. The items listed above may not be a complete list of foods and beverages to avoid. Contact your dietitian for more information. WHERE CAN I FIND MORE INFORMATION? National Heart, Lung, and Blood Institute: [employer]   This information is not intended to replace advice given to you by your health care provider. Make sure you discuss any questions you have with your health care provider.   Document Released: 12/14/2010 Document Revised: 01/15/2014 Document Reviewed: 10/29/2012 Elsevier Interactive Patient Education [employer]

## 2015-08-01 NOTE — Progress Notes (Signed)
Pt is in the office today for dark stool and hypertension Pt states he is out of his blood pressure medication Pt states his stool is better

## 2015-08-01 NOTE — Progress Notes (Signed)
Brent Warren, is a 55 y.o. male  [id number]  [id number]  DOB - 08/12/1960  Chief Complaint  Patient presents with  . Hypertension  . Stool Color Change    dark        Subjective:   Brent Warren is a 55 y.o. male here today for a follow up visit.  Doing well overall, ran out of her bp meds about 1 wks ago, did not realize could get refills.  Smoked 1 cig the other day, and admits to drinking 1 beer the other day, but overall doing well abstaining from tob/etoh.  Gets occasionally postsurg pain on right flank, but improves w/ Voltarin gel.  Denies melana/dark stools/brbpr.  Not currently on iron pills.  Still working on [employer], does not know what his financial aid status is.  Patient has No headache, No chest pain, No abdominal pain - No Nausea, No new weakness tingling or numbness, No Cough - SOB.  No problems updated.  ALLERGIES: No Known Allergies  PAST MEDICAL HISTORY: Past Medical History:  Diagnosis Date  . Alcoholic (HCC)   . Tobacco abuse     MEDICATIONS AT HOME: Prior to Admission medications   Medication Sig Start Date End Date Taking? Authorizing Provider  acetaminophen (TYLENOL) 500 MG tablet Take 1,000 mg by mouth every 6 (six) hours as needed for moderate pain.   Yes Historical Provider, MD  albuterol (PROVENTIL) (2.5 MG/3ML) 0.083% nebulizer solution Take 3 mLs (2.5 mg total) by nebulization every 6 (six) hours as needed for wheezing or shortness of breath. 05/31/15  Yes Pete Glatter, MD  amLODipine (NORVASC) 5 MG tablet Take 1 tablet (5 mg total) by mouth daily. 08/01/15  Yes Pete Glatter, MD  budesonide-formoterol (SYMBICORT) 80-4.5 MCG/ACT inhaler Inhale 2 puffs into the lungs 2 (two) times daily. 05/31/15  Yes Pete Glatter, MD  diclofenac sodium (VOLTAREN) 1 % GEL Apply 2 g topically 4 (four) times daily. 06/20/15  Yes Pete Glatter, MD  amoxicillin-clavulanate (AUGMENTIN) 875-125 MG tablet Take 1 tablet by mouth 2 (two) times  daily. For 14 days Patient not taking: Reported on 08/01/2015 06/08/15   Erin R Barrett, PA-C  benzonatate (TESSALON PERLES) 100 MG capsule Take 1 capsule (100 mg total) by mouth 3 (three) times daily as needed for cough. Patient not taking: Reported on 08/01/2015 05/31/15   Pete Glatter, MD  levofloxacin (LEVAQUIN) 500 MG tablet Take 500 mg by mouth daily.    Historical Provider, MD  nicotine (NICODERM CQ) 7 mg/24hr patch Place 1 patch (7 mg total) onto the skin daily. 08/01/15   Pete Glatter, MD     Objective:   Vitals:   08/01/15 1116  BP: (!) 158/93  Pulse: 71  Resp: 16  Temp: 97.7 F (36.5 C)  TempSrc: Oral  SpO2: 99%  Weight: 130 lb 12.8 oz (59.3 kg)    Exam General appearance : Awake, alert, not in any distress. Speech Clear. Not toxic looking, pleasant, thin. HEENT: Atraumatic and Normocephalic, pupils equally reactive to light. Neck: supple, no JVD.  Chest:Good air entry bilaterally, no added sounds. Right chest/lateral area, at end of incision, there is a 2 inch fat pad/lipoma, nttp. CVS: S1 S2 regular, no murmurs/gallups or rubs. Abdomen: Bowel sounds active, Non tender and not distended. Extremities: B/L Lower Ext shows no edema, both legs are warm to touch Neurology: Awake alert, and oriented X 3, CN II-XII grossly intact, Non focal Skin:No Rash  Data Review No results  found for: HGBA1C  Depression screen PHQ 2/9 06/20/2015  Decreased Interest 0  Down, Depressed, Hopeless 0  PHQ - 2 Score 0      Assessment & Plan   1. Anemia, unspecified anemia type Post surg, not currently on iron supplements, denies gi bleeding. - CBC with Differential  2. htn - ran out of norvasc, did not know he was suppose to get refill on his norvasc. - refilled norvasc  qd, encouraged pt to call Pharm when down to weeks supply to get refill - dash diet discussed  3. Post right VATS,  - doing well, occasional incisional pain.  4. Right sided lipoma.  - per pt, there  prior to surgery.  5. tob cessation recd - nicoderm  patches ordered, tips provided on stopping  6. etoh cessation recd,  Patient have been counseled extensively about nutrition and exercise  Return in about 3 months (around 11/01/2015).  The patient was given clear instructions to go to ER or return to medical center if symptoms don't improve, worsen or new problems develop. The patient verbalized understanding. The patient was told to call to get lab results if they haven't heard anything in the next week.   This note has been created with Education officer, environmental. Any transcriptional errors are unintentional.   Pete Glatter, MD, MBA/MHA [facility] [place], [place] [phone]   08/01/2015, 11:41 AM

## 2015-08-03 ENCOUNTER — Telehealth: Payer: Self-pay | Admitting: Internal Medicine

## 2015-08-03 ENCOUNTER — Telehealth: Payer: Self-pay

## 2015-08-03 NOTE — Telephone Encounter (Signed)
Pt. Returned call. Please f/u with pt. °

## 2015-08-03 NOTE — Telephone Encounter (Signed)
Contacted pt to go over lab results pt didn't answer lvm for pt to give me a call at his earliest convenience  

## 2015-08-05 ENCOUNTER — Ambulatory Visit: Payer: Self-pay

## 2015-08-10 ENCOUNTER — Telehealth: Payer: Self-pay

## 2015-08-10 NOTE — Telephone Encounter (Signed)
Contacted pt to go over lab results pt is aware of results and doesn't have any questions or concerns 

## 2015-08-12 ENCOUNTER — Ambulatory Visit: Payer: Self-pay | Attending: Internal Medicine | Admitting: Physician Assistant

## 2015-08-12 ENCOUNTER — Ambulatory Visit: Payer: Self-pay

## 2015-08-12 VITALS — BP 96/64 | HR 100 | Temp 98.1°F | Ht 69.0 in | Wt 131.0 lb

## 2015-08-12 DIAGNOSIS — Z79899 Other long term (current) drug therapy: Secondary | ICD-10-CM | POA: Insufficient documentation

## 2015-08-12 DIAGNOSIS — H6123 Impacted cerumen, bilateral: Secondary | ICD-10-CM | POA: Insufficient documentation

## 2015-08-12 DIAGNOSIS — F17210 Nicotine dependence, cigarettes, uncomplicated: Secondary | ICD-10-CM | POA: Insufficient documentation

## 2015-08-12 DIAGNOSIS — F1020 Alcohol dependence, uncomplicated: Secondary | ICD-10-CM | POA: Insufficient documentation

## 2015-08-12 MED ORDER — CARBAMIDE PEROXIDE 6.5 % OT SOLN: 5.0000 [drp] | Freq: Once | OTIC | Status: DC

## 2015-08-12 NOTE — Progress Notes (Signed)
Had 3 beers this morning Feeling dizzy Medication refill

## 2015-08-12 NOTE — Progress Notes (Signed)
Patient ID: Ryshawn Wiegand, male   DOB: [birth year], 55 y.o.   MRN: [id number]   Ricko Macdonell, is a 55 y.o. male  [id number]  [id number]  DOB - March 01, 1960  Subjective:  Chief Complaint and HPI: Brent Warren is a 55 y.o. male here today for decreased hearing in R ear X 1 year and L ear X several weeks.  He has noticed since moving in with his brother that he has to have the TV really loud and have people repeat themselves regularly.  He also admits that he has started drinking alcohol again and is smoking.  He has done AA in the past but admits that he never really worked the program.   He was here for a general office visit recently.     ALLERGIES: No Known Allergies  PAST MEDICAL HISTORY: Past Medical History:  Diagnosis Date  . Alcoholic (HCC)   . Tobacco abuse     MEDICATIONS AT HOME: Prior to Admission medications   Medication Sig Start Date End Date Taking? Authorizing Provider  acetaminophen (TYLENOL) 500 MG tablet Take 1,000 mg by mouth every 6 (six) hours as needed for moderate pain.   Yes Historical Provider, MD  albuterol (PROVENTIL) (2.5 MG/3ML) 0.083% nebulizer solution Take 3 mLs (2.5 mg total) by nebulization every 6 (six) hours as needed for wheezing or shortness of breath. 05/31/15  Yes Pete Glatter, MD  amLODipine (NORVASC) 5 MG tablet Take 1 tablet (5 mg total) by mouth daily. 08/01/15  Yes Dawn Marland Mcalpine, MD  benzonatate (TESSALON PERLES) 100 MG capsule Take 1 capsule (100 mg total) by mouth 3 (three) times daily as needed for cough. 05/31/15  Yes Pete Glatter, MD  diclofenac sodium (VOLTAREN) 1 % GEL Apply 2 g topically 4 (four) times daily. 06/20/15  Yes Pete Glatter, MD  amoxicillin-clavulanate (AUGMENTIN) 875-125 MG tablet Take 1 tablet by mouth 2 (two) times daily. For 14 days Patient not taking: Reported on 08/01/2015 06/08/15   Erin R Barrett, PA-C  budesonide-formoterol (SYMBICORT) 80-4.5 MCG/ACT inhaler Inhale 2 puffs into the lungs 2 (two)  times daily. Patient not taking: Reported on 08/12/2015 05/31/15   Pete Glatter, MD  levofloxacin (LEVAQUIN) 500 MG tablet Take 500 mg by mouth daily.    Historical Provider, MD  nicotine (NICODERM CQ) 7 mg/24hr patch Place 1 patch (7 mg total) onto the skin daily. Patient not taking: Reported on 08/12/2015 08/01/15   Pete Glatter, MD     Objective:  EXAM:   Vitals:   08/12/15 1549  BP: 96/64  Pulse: 100  Temp: 98.1 F (36.7 C)  TempSrc: Oral  SpO2: 99%  Weight: 131 lb (59.4 kg)  Height: 5\' 9"  (1.753 m)    General appearance : A&OX3. NAD. Non-toxic-appearing. Thin/frail appearing HEENT: Atraumatic and Normocephalic.  TM obstructed B with Cerumen R>L Neck: supple, no JVD. No cervical lymphadenopathy. No thyromegaly Chest/Lungs:  Breathing-non-labored, Good air entry bilaterally, breath sounds normal without rales, rhonchi, or wheezing  CVS: S1 S2 regular, no murmurs, gallops, rubs  Extremities: Bilateral Lower Ext shows no edema, both legs are warm to touch with = pulse throughout Neurology:  CN II-XII grossly intact, Non focal.   Psych:  TP linear. J/I WNL. Normal speech. Appropriate eye contact and affect.  Skin:  No Rash   Assessment & Plan   1. Cerumen impaction, bilateral - carbamide peroxide (DEBROX) 6.5 % otic solution 5 drop; Place 5 drops into both ears once at bedtime Mon-Wednesday next  week.  RTC Thursday next week for irrigation.  2. Alcohol use disorder, moderate, dependence (HCC) Discussed recovery resources and gave 12 step meeting schedules  Patient have been counseled extensively about nutrition and exercise  Return in about 1 week (around 08/19/2015) for to see me for ear irrigation.  The patient was given clear instructions to go to ER or return to medical center if symptoms don't improve, worsen or new problems develop. The patient verbalized understanding. The patient was told to call to get lab results if they haven't heard anything in the next  week.   Georgian Co, PA-C [facility] [place], [place] [phone]   08/12/2015, 4:52 PM

## 2015-08-15 ENCOUNTER — Ambulatory Visit: Payer: Self-pay

## 2015-08-18 ENCOUNTER — Telehealth: Payer: Self-pay | Admitting: Physician Assistant

## 2015-08-18 ENCOUNTER — Ambulatory Visit: Payer: Self-pay

## 2015-08-18 NOTE — Telephone Encounter (Signed)
I left a message that I have communicated with the pharmacy and they should have his ear drops ready later today or tomorrow for pick up.  He should pick the drops up and use them 2-3 nights before his next scheduled appointment.  I advised him to schedule an appointment for next Monday or Thursday.  Georgian CoAngela McClung, PA-C

## 2015-08-18 NOTE — Telephone Encounter (Signed)
Will send to Brent Warren

## 2015-08-18 NOTE — Telephone Encounter (Signed)
Pt calling requesting for PCP to call him back  Pt had an appointment at 9:30 today, 8/10, but states he is unable to make it  Pt states he wants to speak to PCP about his hearing problem and medications Pt states he was prescribed ear drops last Friday 8/4, but was unable to get Rx

## 2015-08-30 ENCOUNTER — Other Ambulatory Visit: Payer: Self-pay

## 2015-08-30 ENCOUNTER — Ambulatory Visit: Payer: Self-pay | Admitting: Thoracic Surgery (Cardiothoracic Vascular Surgery)

## 2015-09-08 ENCOUNTER — Encounter: Payer: Self-pay | Admitting: Physician Assistant

## 2015-09-08 ENCOUNTER — Ambulatory Visit: Payer: Self-pay | Attending: Physician Assistant | Admitting: Physician Assistant

## 2015-09-08 VITALS — BP 150/92 | HR 87 | Temp 97.8°F | Wt 131.2 lb

## 2015-09-08 DIAGNOSIS — H6123 Impacted cerumen, bilateral: Secondary | ICD-10-CM

## 2015-09-08 DIAGNOSIS — F1020 Alcohol dependence, uncomplicated: Secondary | ICD-10-CM

## 2015-09-08 DIAGNOSIS — I10 Essential (primary) hypertension: Secondary | ICD-10-CM

## 2015-09-08 MED ORDER — AMLODIPINE BESYLATE 5 MG PO TABS: 5.0000 mg | ORAL_TABLET | Freq: Every day | ORAL | 3 refills | Status: DC

## 2015-09-08 NOTE — Progress Notes (Signed)
Patient ID: Brent Warren, male   DOB: 08/13/1960, 55 y.o.   MRN: [id number]   Brent RopesRonnie Warren, is a 55 y.o. male  [id number]  [id number]  DOB - 06/04/1960  Subjective:  Chief Complaint and HPI: Brent RopesRonnie Warren is a 55 y.o. male here today for ear irrigation.  He continues to drink alcohol most days.  Feels depressed because of it.  Previously sober 8 years through AA but quit going and started drinking again.  He denies SI/HI.     ROS:   Constitutional:  No f/c, No night sweats, No unexplained weight loss. EENT:  No vision changes, No blurry vision, No hearing changes. No mouth, throat problems  Respiratory: No cough, No SOB Cardiac: No CP, no palpitations GI:  No abd pain, No N/V/D. GU: No Urinary s/sx Musculoskeletal: No joint pain Neuro: No headache, no dizziness, no motor weakness.  Skin: No rash Endocrine:  No polydipsia. No polyuria.  Psych: Denies SI/HI  No problems updated.  ALLERGIES: No Known Allergies  PAST MEDICAL HISTORY: Past Medical History:  Diagnosis Date  . Alcoholic (HCC)   . HTN (hypertension)   . Tobacco abuse     MEDICATIONS AT HOME: Prior to Admission medications   Medication Sig Start Date End Date Taking? Authorizing Provider  acetaminophen (TYLENOL) 500 MG tablet Take 1,000 mg by mouth every 6 (six) hours as needed for moderate pain.   Yes Historical Provider, MD  albuterol (PROVENTIL) (2.5 MG/3ML) 0.083% nebulizer solution Take 3 mLs (2.5 mg total) by nebulization every 6 (six) hours as needed for wheezing or shortness of breath. 05/31/15  Yes Pete Glatterawn T Langeland, MD  amLODipine (NORVASC) 5 MG tablet Take 1 tablet (5 mg total) by mouth daily. 09/08/15  Yes Anders SimmondsAngela M McClung, PA-C  budesonide-formoterol (SYMBICORT) 80-4.5 MCG/ACT inhaler Inhale 2 puffs into the lungs 2 (two) times daily. 05/31/15  Yes Pete Glatterawn T Langeland, MD  diclofenac sodium (VOLTAREN) 1 % GEL Apply 2 g topically 4 (four) times daily. 06/20/15  Yes Dawn Marland Mcalpine Langeland, MD  nicotine (NICODERM CQ)  7 mg/24hr patch Place 1 patch (7 mg total) onto the skin daily. Patient not taking: Reported on 08/12/2015 08/01/15   Pete Glatterawn T Langeland, MD     Objective:  EXAM:   Vitals:   09/08/15 1038  BP: (!) 150/92  Pulse: 87  Temp: 97.8 F (36.6 C)  TempSrc: Oral  Weight: 131 lb 3.2 oz (59.5 kg)    General appearance : A&OX3. NAD. Non-toxic-appearing HEENT: Atraumatic and Normocephalic.  PERRLA. EOM intact.  TM clear B after long period of warm water irrigation.  Large sized pieces of cerumen removed.  Mouth-MMM, post pharynx WNL w/o erythema, No PND. Neck: supple, no JVD. No cervical lymphadenopathy. No thyromegaly Chest/Lungs:  Breathing-non-labored, Good air entry bilaterally, breath sounds normal without rales, rhonchi, or wheezing  CVS: S1 S2 regular, no murmurs, gallops, rubs  Psych:  TP linear. J/I WNL. Normal speech. Appropriate eye contact and affect.  Skin:  No Rash  Data Review No results found for: HGBA1C   Assessment & Plan   1. Essential hypertension suboptimal control;  Out of meds - amLODipine (NORVASC) 5 MG tablet; Take 1 tablet (5 mg total) by mouth daily.  Dispense: 90 tablet; Refill: 3  2. Alcohol use disorder, moderate, dependence (HCC) Not currently intoxicated 12 step meetings encouraged (he has done these previously and was sober for ~8years). Have given meeting schedule. Consider treatment.  [facility].  Smoking cessation  also encouraged.   3. Cerumen impaction, bilateral Resolved with irrigation by me X25 mins   Patient have been counseled extensively about nutrition and exercise  F/up with Dr. Julien Nordmann in October as planned for bloodwork and check up. The patient was given clear instructions to go to ER or return to medical center if symptoms don't improve, worsen or new problems develop. The patient verbalized understanding. The patient was told to call to get lab results if they haven't heard anything in the  next week.     Georgian Co, PA-C [facility] and [facility] [place], [place] [phone]   09/08/2015, 11:48 AM

## 2015-09-08 NOTE — Progress Notes (Signed)
Pt states he is here today due to ear impaction in his right ear. He states he was unable to purchase the Debrox OTC due to not financial strain. He states he has started back smoking and drinking as well.   Pt states he has no other complaints at this time.

## 2015-09-14 MED FILL — ?AMLODIPINE BESYLATE 5 MG T: 5 | 30 days supply | Qty: 30 | Fill #0

## 2015-09-20 ENCOUNTER — Ambulatory Visit
Admission: RE | Admit: 2015-09-20 | Discharge: 2015-09-20 | Disposition: A | Discharge status: Home / Self Care | Payer: No Typology Code available for payment source | Source: Ambulatory Visit | Attending: Thoracic Surgery (Cardiothoracic Vascular Surgery) | Admitting: Thoracic Surgery (Cardiothoracic Vascular Surgery)

## 2015-09-20 ENCOUNTER — Encounter: Payer: Self-pay | Admitting: Thoracic Surgery (Cardiothoracic Vascular Surgery)

## 2015-09-20 ENCOUNTER — Ambulatory Visit (INDEPENDENT_AMBULATORY_CARE_PROVIDER_SITE_OTHER): Payer: Self-pay | Admitting: Thoracic Surgery (Cardiothoracic Vascular Surgery)

## 2015-09-20 VITALS — BP 148/92 | HR 77 | Resp 16 | Ht 69.0 in | Wt 130.0 lb

## 2015-09-20 DIAGNOSIS — R918 Other nonspecific abnormal finding of lung field: Secondary | ICD-10-CM

## 2015-09-20 DIAGNOSIS — J869 Pyothorax without fistula: Secondary | ICD-10-CM

## 2015-09-20 DIAGNOSIS — Z09 Encounter for follow-up examination after completed treatment for conditions other than malignant neoplasm: Secondary | ICD-10-CM

## 2015-09-20 NOTE — Progress Notes (Signed)
[address]       [place] [id number]             [phone]       HPI: Brent Warren returns today for a scheduled follow-up visit.  He is a 55 year old man with a history of tobacco and ethanol abuse who presented with a right empyema back in May 2017. I did right VATS, drainage of the empyema and decortication on 06/03/2015. He did well postoperatively. He quit smoking for a while left discharge, but now says he is smoking again.  On the CT scan which showed his empyema there were 2 areas of consolidation versus nodules at the left base. He returns now for follow-up CT to reevaluate those nodules.  He's not having any residual incisional pain. He's not had any recent problems with his breathing. As noted he has started smoking again. His appetite is good and his weight is stable.  Past Medical History:  Diagnosis Date  . Alcoholic (HCC)   . HTN (hypertension)   . Tobacco abuse    Past Surgical History:  Procedure Laterality Date  . arm surgery    . DECORTICATION Right 06/03/2015   Procedure: DECORTICATION;  Surgeon: Loreli SlotSteven C Rilley Poulter, MD;  [facility];  Service: Thoracic;  Laterality: Right;  Marland Kitchen. VIDEO ASSISTED THORACOSCOPY (VATS)/EMPYEMA Right 06/03/2015   Procedure: VIDEO ASSISTED THORACOSCOPY (VATS)/EMPYEMA;  Surgeon: Loreli SlotSteven C Taite Schoeppner, MD;  [facility];  Service: Thoracic;  Laterality: Right;   Social History   Social History  . Marital status: Single    Spouse name: N/A  . Number of children: N/A  . Years of education: N/A   Occupational History  . Not on file.   Social History Main Topics  . Smoking status: Current Every Day Smoker    Packs/day: 1.00    Types: Cigarettes    Start date: 07/08/2015  . Smokeless tobacco: Never Used     Comment: a pack in a couple of days  . Alcohol use 1.8 - 2.4 oz/week    3 - 4 Cans of beer per week     Comment: daily  . Drug use: No  . Sexual activity: Not on file   Other Topics Concern  .  Not on file   Social History Narrative  . No narrative on file   Current Outpatient Prescriptions  Medication Sig Dispense Refill  . amLODipine (NORVASC) 5 MG tablet Take 1 tablet (5 mg total) by mouth daily. 90 tablet 3  . acetaminophen (TYLENOL) 500 MG tablet Take 1,000 mg by mouth every 6 (six) hours as needed for moderate pain.    Marland Kitchen. albuterol (PROVENTIL) (2.5 MG/3ML) 0.083% nebulizer solution Take 3 mLs (2.5 mg total) by nebulization every 6 (six) hours as needed for wheezing or shortness of breath. (Patient not taking: Reported on 09/20/2015) 150 mL 1  . budesonide-formoterol (SYMBICORT) 80-4.5 MCG/ACT inhaler Inhale 2 puffs into the lungs 2 (two) times daily. (Patient not taking: Reported on 09/20/2015) 1 Inhaler 3  . diclofenac sodium (VOLTAREN) 1 % GEL Apply 2 g topically 4 (four) times daily. (Patient not taking: Reported on 09/20/2015) 100 g 2  . nicotine (NICODERM CQ) 7 mg/24hr patch Place 1 patch (7 mg total) onto the skin daily. (Patient not taking: Reported on 08/12/2015) 28 patch 2   Current Facility-Administered Medications  Medication Dose Route Frequency Provider Last Rate Last Dose  . carbamide peroxide (DEBROX) 6.5 % otic solution 5 drop  5 drop Both Ears Once Anders Simmonds, PA-C        Physical Exam BP (!) 148/92   Pulse 77   Resp 16   Ht 5\' 9"  (1.753 m)   Wt 130 lb (59 kg)   SpO2 98% Comment: ON RA  BMI 19.77 kg/m  55 year old man in no acute distress Alert and oriented 3 with no focal neurologic deficits Cardiac regular rate and rhythm normal S1 and S2 Lungs slightly diminished bilaterally but equal, no wheezing  Diagnostic Tests: CT CHEST WITHOUT CONTRAST  TECHNIQUE: Multidetector CT imaging of the chest was performed following the standard protocol without IV contrast.  COMPARISON:  Chest radiographs 06/21/2015, 06/08/2015, CT scan chest 06/01/2015  FINDINGS: Cardiovascular: Limited without the presence of intravenous contrast. Atherosclerotic  vascular calcifications are present within the aorta. No aneurysmal dilatation. Heart size is nonenlarged. No significant pericardial effusion.  Mediastinum/Nodes: Image thyroid glands unremarkable. Interval decrease in size of previously noted prominent mediastinal lymph nodes. No significantly enlarged mediastinal lymph nodes present on current exam. No significant axillary adenopathy. A tiny anterior cardiophrenic node is unchanged. Trachea and mainstem bronchi a within normal limits.  Lungs/Pleura: Mild paraseptal and centrilobular emphysema with upper lobe predominance again noted. Previously noted confluent nodular airspace disease within the posterior left lower lobe have resolved. A large gas and fluid containing collection/empyema within the right chest has also resolved. Mild irregular density is seen in the right posterior peripheral lower lobe and is favored to represent scarring given stable radiographic appearance. A few tiny nodular densities are seen at the periphery of the presumed scarring in the right lower lobe. Vague foci of ground-glass density in the right upper lobe have slightly decreased compared to prior. Small nodular right middle lobe densities are present, likely also representing scarring.  There are multiple small pulmonary nodules, the most notable of which will be described as follows:  Right peripheral lower lobe nodule measuring 6 mm in diameter adjacent to the fissure, series 4, image number 75.  Right middle lobe anterior subpleural nodule measuring 5 mm, series 4, image number 93.  Left upper lobe 2 mm nodule abutting the fissure, series 4, image number 62.  Left lower lobe posterior subpleural nodule measuring 5 mm, series 4, image number 92. This is grossly unchanged compared to comparison study.  Left lower lobe posterior medial subpleural nodule measuring 3 mm, series 4, image number 105.  Upper Abdomen: Limited images  through the upper abdomen demonstrate grossly negative noncontrast appearance of the imaged portions of the liver, spleen, pancreas, gallbladder, adrenal glands and upper poles of the kidneys.  Musculoskeletal: No acute osseous abnormality. Bony irregularity of the mid sternum with increasing peripheral ossifications suggests a healing fracture.a  IMPRESSION: 1. Interim resolution of previously noted right chest empyema. Residual irregular density at the right lower lobe favored to represent scarring. A few small surrounding nodular densities are noted. 2. Interim resolution of previously noted masslike consolidations in the left lower lobe. 3. Multiple bilateral sub cm pulmonary nodules. Non-contrast chest CT at 3-6 months is recommended. If the nodules are stable at time of repeat CT, then future CT at 18-24 months (from today's scan) is considered optional for low-risk patients, but is recommended for high-risk patients. This recommendation follows the consensus statement: Guidelines for Management of Incidental Pulmonary Nodules Detected on CT Images:From the Fleischner Society 2017; published online before print (10.1148/radiol.4098119147). 4. Bony irregularity of the mid sternum with increasing surrounding callus suggests a healing fracture. 5. Bilateral  emphysematous disease. 6. Atherosclerotic vascular disease   Electronically Signed   By: Jasmine Pang M.D.   On: 09/20/2015 14:22 I personally reviewed the CT chest and concur with the findings as noted above.  Impression: Brent Warren is a 55 year old gentleman who is now about 3-1/2 months out from a right VATS for drainage of empyema and decortication of the lung. He had a good postoperative result.  There were questionable lung nodules in the left lung on his CT. He returns today with a repeat CT to reevaluate that. The areas of suspicion on the previous CT have resolved.  He does have multiple other small  pulmonary nodules bilaterally. Given that he is high risk with his smoking history, I do think we need to follow those. We will plan to see him back in 6 months with a CT chest.  Tobacco abuse - Smoking cessation instruction/counseling given:  counseled patient on the dangers of tobacco use, advised patient to stop smoking, and reviewed strategies to maximize success   Plan: Return in 6 months with CT chest  [name], MD [facility] [phone]

## 2015-10-14 ENCOUNTER — Telehealth: Payer: Self-pay | Admitting: Internal Medicine

## 2015-10-14 DIAGNOSIS — I10 Essential (primary) hypertension: Secondary | ICD-10-CM

## 2015-10-14 MED ORDER — AMLODIPINE BESYLATE 5 MG PO TABS: 5.0000 mg | ORAL_TABLET | Freq: Every day | ORAL | 0 refills | Status: DC

## 2015-10-14 MED FILL — AMLODIPINE BESYLATE 5 MG TA: 5 | 30 days supply | Qty: 30 | Fill #0

## 2015-10-14 NOTE — Telephone Encounter (Signed)
Patient called the office to request refill on his bp medication. Please send meds to our pharmacy.   Thank you

## 2015-10-14 NOTE — Telephone Encounter (Signed)
Blood pressure medications refilled. 

## 2015-11-08 ENCOUNTER — Encounter: Payer: Self-pay | Admitting: Licensed Clinical Social Worker

## 2015-11-08 ENCOUNTER — Encounter: Payer: Self-pay | Admitting: Internal Medicine

## 2015-11-08 ENCOUNTER — Ambulatory Visit: Payer: Self-pay | Attending: Internal Medicine | Admitting: Internal Medicine

## 2015-11-08 VITALS — BP 145/89 | HR 87 | Temp 98.0°F | Wt 135.4 lb

## 2015-11-08 DIAGNOSIS — R918 Other nonspecific abnormal finding of lung field: Secondary | ICD-10-CM

## 2015-11-08 DIAGNOSIS — Z1211 Encounter for screening for malignant neoplasm of colon: Secondary | ICD-10-CM

## 2015-11-08 DIAGNOSIS — R079 Chest pain, unspecified: Secondary | ICD-10-CM | POA: Insufficient documentation

## 2015-11-08 DIAGNOSIS — F32A Depression, unspecified: Secondary | ICD-10-CM

## 2015-11-08 DIAGNOSIS — F1020 Alcohol dependence, uncomplicated: Secondary | ICD-10-CM | POA: Insufficient documentation

## 2015-11-08 DIAGNOSIS — I10 Essential (primary) hypertension: Secondary | ICD-10-CM | POA: Insufficient documentation

## 2015-11-08 DIAGNOSIS — F17210 Nicotine dependence, cigarettes, uncomplicated: Secondary | ICD-10-CM | POA: Insufficient documentation

## 2015-11-08 DIAGNOSIS — Z720 Tobacco use: Secondary | ICD-10-CM

## 2015-11-08 DIAGNOSIS — Z9114 Patient's other noncompliance with medication regimen: Secondary | ICD-10-CM

## 2015-11-08 DIAGNOSIS — Z23 Encounter for immunization: Secondary | ICD-10-CM | POA: Insufficient documentation

## 2015-11-08 DIAGNOSIS — Z79899 Other long term (current) drug therapy: Secondary | ICD-10-CM | POA: Insufficient documentation

## 2015-11-08 DIAGNOSIS — F329 Major depressive disorder, single episode, unspecified: Secondary | ICD-10-CM | POA: Insufficient documentation

## 2015-11-08 HISTORY — DX: Other nonspecific abnormal finding of lung field: R91.8

## 2015-11-08 LAB — CBC WITH DIFFERENTIAL/PLATELET
BASOS ABS: 56 {cells}/uL (ref 0–200)
Basophils Relative: 1 %
EOS ABS: 224 {cells}/uL (ref 15–500)
EOS PCT: 4 %
HEMATOCRIT: 38.1 % — AB (ref 38.5–50.0)
HEMOGLOBIN: 13.2 g/dL (ref 13.2–17.1)
LYMPHS ABS: 2128 {cells}/uL (ref 850–3900)
Lymphocytes Relative: 38 %
MCH: 34.2 pg — AB (ref 27.0–33.0)
MCHC: 34.6 g/dL (ref 32.0–36.0)
MCV: 98.7 fL (ref 80.0–100.0)
MPV: 10.7 fL (ref 7.5–12.5)
Monocytes Absolute: 392 cells/uL (ref 200–950)
Monocytes Relative: 7 %
NEUTROS PCT: 50 %
Neutro Abs: 2800 cells/uL (ref 1500–7800)
Platelets: 194 10*3/uL (ref 140–400)
RBC: 3.86 MIL/uL — ABNORMAL LOW (ref 4.20–5.80)
RDW: 14.5 % (ref 11.0–15.0)
WBC: 5.6 10*3/uL (ref 3.8–10.8)

## 2015-11-08 MED ORDER — AMLODIPINE BESYLATE 5 MG PO TABS: 5.0000 mg | ORAL_TABLET | Freq: Every day | ORAL | 3 refills | Status: DC

## 2015-11-08 MED ORDER — BUDESONIDE-FORMOTEROL FUMARATE 80-4.5 MCG/ACT IN AERO: 2.0000 | INHALATION_SPRAY | Freq: Two times a day (BID) | RESPIRATORY_TRACT | 3 refills | Status: DC

## 2015-11-08 MED ORDER — BUPROPION HCL ER (SR) 100 MG PO TB12: 100.0000 mg | ORAL_TABLET | Freq: Two times a day (BID) | ORAL | 1 refills | Status: DC

## 2015-11-08 NOTE — BH Specialist Note (Signed)
Session Start time: 3:50 pm   End Time: 4:10 pm Total Time:  20 minutes Type of Service: Behavioral Health - Individual/Family Interpreter: No.   Interpreter Name & Language: N/A # [facility]-June [facility]   SUBJECTIVE: Christen BameRonnie Petras is a 55 y.o. male  Pt. was referred by Dr. Julien NordmannLangeland for:  anxiety, depression and excessive alcohol consumption. Pt. 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 Pt reports overwhelming feelings of nervousness and worry.  Pt may benefit from psychotherapy and medication management. Pt has limited support. Pt disclosed frustration with recent relapse; is knowledgeable of AA meeting schedules and how to obtain updated information. LCSWA provided pt community resources to assist with alcohol detox, as requested by pt. LCSWA discussed healthy coping skills with pt. Pt identified a  healthy coping skill to utilize on a weekly basis to decrease symptoms of depression and anxiety.       PLAN: 1. F/U with behavioral health clinician: Pt was encouraged to contact LCSWA if symptoms worsen or fail to improve to [facility]. 2. Behavioral Health meds: Wellbutrin 3. Behavioral recommendations: LCSWA recommends that pt apply healthy coping skills discussed. Pt is encouraged to schedule follow up appointment with LCSWA 4. Referral: Brief Counseling/Psychotherapy, Publishing rights managerCommunity Resource and Supportive Counseling 5. From scale of 1-10, how likely are you to follow plan: 7/10   Bridgett LarssonJasmine D Janett Kamath, MSW, LCSWA  Clinical Social Work 11/09/15 2:29 PM  Warmhandoff:   Warm Hand Off Completed.

## 2015-11-08 NOTE — Progress Notes (Signed)
Brent Warren, is a 55 y.o. male  IEP:[id number]:[id number]  YSA:[id number]  DOB - 07/26/1960  Chief Complaint  Patient presents with  . Hypertension  . Chest Pain        Subjective:   Brent RopesRonnie Warren is a 55 y.o. male here today for a follow up visit.  Back to smoking almost 1 ppd and at least 2 beers daily, more if he can afford. He has hx of alcoholism, use to go to AA mtgs, but none recently.   Sad/depressed that he is back in this rut.  Once to changed, but hard to quit habits.  Owes money at pharmacy, so was unable to pick up some meds until recently.  Per pt, taking meds intermittantly.  Recently saw CTS, concerned about recent ct chest findings. He told me the surgeon saw something on "his heart", which is making him worried.  Still living w/ his brother, who has been helping him out since he became so sick.  Patient has No headache, No chest pain, No abdominal pain - No Nausea, No new weakness tingling or numbness, No Cough - SOB.  Problem  Lung Nodule, Multiple    ALLERGIES: No Known Allergies  PAST MEDICAL HISTORY: Past Medical History:  Diagnosis Date  . Alcoholic (HCC)   . 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 HTN (hypertension)   . Pneumonia 05/2015  . Tobacco abuse     MEDICATIONS AT HOME: Prior to Admission medications   Medication Sig Start Date End Date Taking? Authorizing Provider  albuterol (PROVENTIL) (2.5 MG/3ML) 0.083% nebulizer solution Take 3 mLs (2.5 mg total) by nebulization every 6 (six) hours as needed for wheezing or shortness of breath. 05/31/15  Yes Pete Glatterawn T Teosha Casso, MD  acetaminophen (TYLENOL) 500 MG tablet Take 1,000 mg by mouth every 6 (six) hours as needed for moderate pain.    Historical Provider, MD  amLODipine (NORVASC) 5 MG tablet Take 1 tablet (5 mg total) by mouth daily. 11/08/15   Pete Glatterawn T Kaylon Laroche, MD  budesonide-formoterol (SYMBICORT) 80-4.5 MCG/ACT inhaler Inhale 2 puffs into the lungs 2 (two) times daily. 11/08/15   Pete Glatterawn T  Kenry Daubert, MD  buPROPion (WELLBUTRIN SR) 100 MG 12 hr tablet Take 1 tablet (100 mg total) by mouth 2 (two) times daily. 11/08/15   Pete Glatterawn T Ania Levay, MD  diclofenac sodium (VOLTAREN) 1 % GEL Apply 2 g topically 4 (four) times daily. Patient not taking: Reported on 11/08/2015 06/20/15   Pete Glatterawn T Raheen Capili, MD  nicotine (NICODERM CQ) 7 mg/24hr patch Place 1 patch (7 mg total) onto the skin daily. Patient not taking: Reported on 11/08/2015 08/01/15   Pete Glatterawn T Akhila Mahnken, MD     Objective:   Vitals:   11/08/15 1527  BP: (!) 145/89  Pulse: 87  Temp: 98 F (36.7 C)  TempSrc: Oral  SpO2: 98%  Weight: 135 lb 6.4 oz (61.4 kg)   Exam General appearance : Awake, alert, not in any distress. Speech Clear. Not toxic looking, thin, pleasant. Appears older than stated aged, +beard. HEENT: Atraumatic and Normocephalic, pupils equally reactive to light. Neck: supple, no JVD.  Chest:Good air entry bilaterally, no added sounds. CVS: S1 S2 regular, no murmurs/gallups or rubs. Abdomen: Bowel sounds active, Non tender and not distended with no gaurding, rigidity or rebound. Extremities: B/L Lower Ext shows no edema, both legs are warm to touch Neurology: Awake alert, and oriented X 3, CN II-XII grossly intact, Non focal Skin:No Rash  Data Review No results found for: HGBA1C  Depression screen [facility] 2/9 11/08/2015 09/08/2015 08/01/2015 06/20/2015  Decreased Interest 1 1 - 0  Down, Depressed, Hopeless 1 2 0 0  PHQ - 2 Score 2 3 0 0  Altered sleeping 3 1 - -  Tired, decreased energy 1 1 - -  Change in appetite 0 0 - -  Feeling bad or failure about yourself  3 1 - -  Trouble concentrating 1 0 - -  Moving slowly or fidgety/restless 2 1 - -  Suicidal thoughts 0 0 - -  PHQ-9 Score 12 7 - -      Assessment & Plan   1. Essential hypertension Elevated, encouraged to pick up rx to better control bp. - amLODipine (NORVASC) 5 MG tablet; Take 1 tablet (5 mg total) by mouth daily.  Dispense: 90 tablet; Refill:  3 - CBC with Differential - CMP and Liver  2. Alcohol use disorder, moderate, dependence (HCC) - total cessation rcd - CMP and Liver - will ask sw to talk to pt and provide areas resources  3. Lung nodule, multiple - went over recent CT chest w/ pt.  Told him that it was lung nodules which are concerning, not anything to do with his heart.   - needs repeat CT chest in 6months - following by Dr Robby SermonHendricks.  4. Tobacco abuse Total cessation recd - trial welbutrin 150bid.,may help w/ anxiety/depression as well  5. Depression, Denies si/hi/avh, living w/ brother, feels like in rut again, hard to get out of addiction. - trial welbutrin 150 bid, may help w/ tob abuse as well. - recd pick up meds, he has to try to put together some money.  6. Colon cancer screening - Ambulatory referral to Gastroenterology  7. Encounter for immunization - pneumococcal 23v today - Flu Vaccine QUAD 36+ mos IM  - tdap next time     Patient have been counseled extensively about nutrition and exercise  Return in about 3 months (around 02/08/2016).  The patient was given clear instructions to go to ER or return to medical center if symptoms don't improve, worsen or new problems develop. The patient verbalized understanding. The patient was told to call to get lab results if they haven't heard anything in the next week.   This note has been created with Education officer, environmentalDragon speech recognition software and smart phrase technology. Any transcriptional errors are unintentional.   Pete Glatterawn T Keenya Matera, MD, MBA/MHA [facility] [place] [phone]   11/08/2015, 8:12 PM

## 2015-11-08 NOTE — Patient Instructions (Addendum)
- Orange card?    You Can Quit Smoking If you are ready to quit smoking or are thinking about it, congratulations! You have chosen to help yourself be healthier and live longer! There are lots of different ways to quit smoking. Nicotine gum, nicotine patches, a nicotine inhaler, or nicotine nasal spray can help with physical craving. Hypnosis, support groups, and medicines help break the habit of smoking. TIPS TO GET OFF AND STAY OFF CIGARETTES  Learn to predict your moods. Do not let a bad situation be your excuse to have a cigarette. Some situations in your life might tempt you to have a cigarette.  Ask friends and co-workers not to smoke around you.  Make your home smoke-free.  Never have "just one" cigarette. It leads to wanting another and another. Remind yourself of your decision to quit.  On a card, make a list of your reasons for not smoking. Read it at least the same number of times a day as you have a cigarette. Tell yourself everyday, "I do not want to smoke. I choose not to smoke."  Ask someone at home or work to help you with your plan to quit smoking.  Have something planned after you eat or have a cup of coffee. Take a walk or get other exercise to perk you up. This will help to keep you from overeating.  Try a relaxation exercise to calm you down and decrease your stress. Remember, you may be tense and nervous the first two weeks after you quit. This will pass.  Find new activities to keep your hands busy. Play with a pen, coin, or rubber band. Doodle or draw things on paper.  Brush your teeth right after eating. This will help cut down the craving for the taste of tobacco after meals. You can try mouthwash too.  Try gum, breath mints, or diet candy to keep something in your mouth. IF YOU SMOKE AND WANT TO QUIT:  Do not stock up on cigarettes. Never buy a carton. Wait until one pack is finished before you buy another.  Never carry cigarettes with you at work or at  home.  Keep cigarettes as far away from you as possible. Leave them with someone else.  Never carry matches or a lighter with you.  Ask yourself, "Do I need this cigarette or is this just a reflex?"  Bet with someone that you can quit. Put cigarette money in a piggy bank every morning. If you smoke, you give up the money. If you do not smoke, by the end of the week, you keep the money.  Keep trying. It takes 21 days to change a habit!  Talk to your doctor about using medicines to help you quit. These include nicotine replacement gum, lozenges, or skin patches.   This information is not intended to replace advice given to you by your health care provider. Make sure you discuss any questions you have with your health care provider.   Document Released: 10/21/2008 Document Revised: 03/19/2011 Document Reviewed: 10/21/2008 Elsevier Interactive Patient Education 2016 Else[employer] Inc.  - Alcohol Use Disorder Alcohol use disorder is a mental disorder. It is not a one-time incident of heavy drinking. Alcohol use disorder is the excessive and uncontrollable use of alcohol over time that leads to problems with functioning in one or more areas of daily living. People with this disorder risk harming themselves and others when they drink to excess. Alcohol use disorder also can cause other mental disorders, such as  mood and anxiety disorders, and serious physical problems. People with alcohol use disorder often misuse other drugs.  Alcohol use disorder is common and widespread. Some people with this disorder drink alcohol to cope with or escape from negative life events. Others drink to relieve chronic pain or symptoms of mental illness. People with a family history of alcohol use disorder are at higher risk of losing control and using alcohol to excess.  Drinking too much alcohol can cause injury, accidents, and health problems. One drink can be too much when you are:  Working.  Pregnant or  breastfeeding.  Taking medicines. Ask your doctor.  Driving or planning to drive. SYMPTOMS  Signs and symptoms of alcohol use disorder may include the following:   Consumption ofalcohol inlarger amounts or over a longer period of time than intended.  Multiple unsuccessful attempts to cutdown or control alcohol use.   A great deal of time spent obtaining alcohol, using alcohol, or recovering from the effects of alcohol (hangover).  A strong desire or urge to use alcohol (cravings).   Continued use of alcohol despite problems at work, school, or home because of alcohol use.   Continued use of alcohol despite problems in relationships because of alcohol use.  Continued use of alcohol in situations when it is physically hazardous, such as driving a car.  Continued use of alcohol despite awareness of a physical or psychological problem that is likely related to alcohol use. Physical problems related to alcohol use can involve the brain, heart, liver, stomach, and intestines. Psychological problems related to alcohol use include intoxication, depression, anxiety, psychosis, delirium, and dementia.   The need for increased amounts of alcohol to achieve the same desired effect, or a decreased effect from the consumption of the same amount of alcohol (tolerance).  Withdrawal symptoms upon reducing or stopping alcohol use, or alcohol use to reduce or avoid withdrawal symptoms. Withdrawal symptoms include:  Racing heart.  Hand tremor.  Difficulty sleeping.  Nausea.  Vomiting.  Hallucinations.  Restlessness.  Seizures. DIAGNOSIS Alcohol use disorder is diagnosed through an assessment by your health care provider. Your health care provider may start by asking three or four questions to screen for excessive or problematic alcohol use. To confirm a diagnosis of alcohol use disorder, at least two symptoms must be present within a 33-month period. The severity of alcohol use  disorder depends on the number of symptoms:  Mild--two or three.  Moderate--four or five.  Severe--six or more. Your health care provider may perform a physical exam or use results from lab tests to see if you have physical problems resulting from alcohol use. Your health care provider may refer you to a mental health professional for evaluation. TREATMENT  Some people with alcohol use disorder are able to reduce their alcohol use to low-risk levels. Some people with alcohol use disorder need to quit drinking alcohol. When necessary, mental health professionals with specialized training in substance use treatment can help. Your health care provider can help you decide how severe your alcohol use disorder is and what type of treatment you need. The following forms of treatment are available:   Detoxification. Detoxification involves the use of prescription medicines to prevent alcohol withdrawal symptoms in the first week after quitting. This is important for people with a history of symptoms of withdrawal and for heavy drinkers who are likely to have withdrawal symptoms. Alcohol withdrawal can be dangerous and, in severe cases, cause death. Detoxification is usually provided in a hospital or  in-patient substance use treatment facility.  Counseling or talk therapy. Talk therapy is provided by substance use treatment counselors. It addresses the reasons people use alcohol and ways to keep them from drinking again. The goals of talk therapy are to help people with alcohol use disorder find healthy activities and ways to cope with life stress, to identify and avoid triggers for alcohol use, and to handle cravings, which can cause relapse.  Medicines.Different medicines can help treat alcohol use disorder through the following actions:  Decrease alcohol cravings.  Decrease the positive reward response felt from alcohol use.  Produce an uncomfortable physical reaction when alcohol is used (aversion  therapy).  Support groups. Support groups are run by people who have quit drinking. They provide emotional support, advice, and guidance. These forms of treatment are often combined. Some people with alcohol use disorder benefit from intensive combination treatment provided by specialized substance use treatment centers. Both inpatient and outpatient treatment programs are available.   This information is not intended to replace advice given to you by your health care provider. Make sure you discuss any questions you have with your health care provider.   Document Released: 02/02/2004 Document Revised: 01/15/2014 Document Reviewed: 04/03/2012 Elsevier Interactive Patient Education 2016 [employer].  -  You Can Quit Smoking If you are ready to quit smoking or are thinking about it, congratulations! You have chosen to help yourself be healthier and live longer! There are lots of different ways to quit smoking. Nicotine gum, nicotine patches, a nicotine inhaler, or nicotine nasal spray can help with physical craving. Hypnosis, support groups, and medicines help break the habit of smoking. TIPS TO GET OFF AND STAY OFF CIGARETTES  Learn to predict your moods. Do not let a bad situation be your excuse to have a cigarette. Some situations in your life might tempt you to have a cigarette.  Ask friends and co-workers not to smoke around you.  Make your home smoke-free.  Never have "just one" cigarette. It leads to wanting another and another. Remind yourself of your decision to quit.  On a card, make a list of your reasons for not smoking. Read it at least the same number of times a day as you have a cigarette. Tell yourself everyday, "I do not want to smoke. I choose not to smoke."  Ask someone at home or work to help you with your plan to quit smoking.  Have something planned after you eat or have a cup of coffee. Take a walk or get other exercise to perk you up. This will help to keep you  from overeating.  Try a relaxation exercise to calm you down and decrease your stress. Remember, you may be tense and nervous the first two weeks after you quit. This will pass.  Find new activities to keep your hands busy. Play with a pen, coin, or rubber band. Doodle or draw things on paper.  Brush your teeth right after eating. This will help cut down the craving for the taste of tobacco after meals. You can try mouthwash too.  Try gum, breath mints, or diet candy to keep something in your mouth. IF YOU SMOKE AND WANT TO QUIT:  Do not stock up on cigarettes. Never buy a carton. Wait until one pack is finished before you buy another.  Never carry cigarettes with you at work or at home.  Keep cigarettes as far away from you as possible. Leave them with someone else.  Never carry matches or  a lighter with you.  Ask yourself, "Do I need this cigarette or is this just a reflex?"  Bet with someone that you can quit. Put cigarette money in a piggy bank every morning. If you smoke, you give up the money. If you do not smoke, by the end of the week, you keep the money.  Keep trying. It takes 21 days to change a habit!  Talk to your doctor about using medicines to help you quit. These include nicotine replacement gum, lozenges, or skin patches.   This information is not intended to replace advice given to you by your health care provider. Make sure you discuss any questions you have with your health care provider.   Document Released: 10/21/2008 Document Revised: 03/19/2011 Document Reviewed: 10/21/2008 Elsevier Interactive Patient Education 2016 Elsevier Inc.   Pneumococcal Polysaccharide Vaccine: What You Need to Know 1. Why get vaccinated? Vaccination can protect older adults (and some children and younger adults) from pneumococcal disease. Pneumococcal disease is caused by bacteria that can spread from person to person through close contact. It can cause ear infections, and it can  also lead to more serious infections of the:   Lungs (pneumonia),  Blood (bacteremia), and  Covering of the brain and spinal cord (meningitis). Meningitis can cause deafness and brain damage, and it can be fatal. Anyone can get pneumococcal disease, but children under 852 years of age, people with certain medical conditions, adults over 56 years of age, and cigarette smokers are at the highest risk. About 18,000 older adults die each year from pneumococcal disease in the [place]. Treatment of pneumococcal infections with penicillin and other drugs used to be more effective. But some strains of the disease have become resistant to these drugs. This makes prevention of the disease, through vaccination, even more important. 2. Pneumococcal polysaccharide vaccine (PPSV23) Pneumococcal polysaccharide vaccine (PPSV23) protects against 23 types of pneumococcal bacteria. It will not prevent all pneumococcal disease. PPSV23 is recommended for:  All adults 965 years of age and older,  Anyone 2 through 56 years of age with certain long-term health problems,  Anyone 2 through 56 years of age with a weakened immune system,  Adults 1119 through 56 years of age who smoke cigarettes or have asthma. Most people need only one dose of PPSV. A second dose is recommended for certain high-risk groups. People 5965 and older should get a dose even if they have gotten one or more doses of the vaccine before they turned 65. Your healthcare provider can give you more information about these recommendations. Most healthy adults develop protection within 2 to 3 weeks of getting the shot. 3. Some people should not get this vaccine  Anyone who has had a life-threatening allergic reaction to PPSV should not get another dose.  Anyone who has a severe allergy to any component of PPSV should not receive it. Tell your provider if you have any severe allergies.  Anyone who is moderately or severely ill when the shot is  scheduled may be asked to wait until they recover before getting the vaccine. Someone with a mild illness can usually be vaccinated.  Children less than 832 years of age should not receive this vaccine.  There is no evidence that PPSV is harmful to either a pregnant woman or to her fetus. However, as a precaution, women who need the vaccine should be vaccinated before becoming pregnant, if possible. 4. Risks of a vaccine reaction With any medicine, including vaccines, there is a chance  of side effects. These are usually mild and go away on their own, but serious reactions are also possible. About half of people who get PPSV have mild side effects, such as redness or pain where the shot is given, which go away within about two days. Less than 1 out of 100 people develop a fever, muscle aches, or more severe local reactions. Problems that could happen after any vaccine:  People sometimes faint after a medical procedure, including vaccination. Sitting or lying down for about 15 minutes can help prevent fainting, and injuries caused by a fall. Tell your doctor if you feel dizzy, or have vision changes or ringing in the ears.  Some people get severe pain in the shoulder and have difficulty moving the arm where a shot was given. This happens very rarely.  Any medication can cause a severe allergic reaction. Such reactions from a vaccine are very rare, estimated at about 1 in a million doses, and would happen within a few minutes to a few hours after the vaccination. As with any medicine, there is a very remote chance of a vaccine causing a serious injury or death. The safety of vaccines is always being monitored. For more information, visit: http://floyd.org/ 5. What if there is a serious reaction? What should I look for? Look for anything that concerns you, such as signs of a severe allergic reaction, very high fever, or unusual behavior.  Signs of a severe allergic reaction can include hives,  swelling of the face and throat, difficulty breathing, a fast heartbeat, dizziness, and weakness. These would usually start a few minutes to a few hours after the vaccination. What should I do? If you think it is a severe allergic reaction or other emergency that can't wait, call 9-1-1 or get to the nearest hospital. Otherwise, call your doctor. Afterward, the reaction should be reported to the Vaccine Adverse Event Reporting System (VAERS). Your doctor might file this report, or you can do it yourself through the VAERS web site at www.vaers.LAgents.no, or by calling 1-671-276-9816.  VAERS does not give medical advice. 6. How can I learn more?  Ask your doctor. He or she can give you the vaccine package insert or suggest other sources of information.  Call your local or state health department.  Contact the Centers for Disease Control and Prevention (CDC):  Call 612-530-8600 (1-800-CDC-INFO) or  Visit CDC's website at PicCapture.uy CDC Pneumococcal Polysaccharide Vaccine VIS (05/01/13)   This information is not intended to replace advice given to you by your health care provider. Make sure you discuss any questions you have with your health care provider.   Document Released: 10/22/2005 Document Revised: 01/15/2014 Document Reviewed: 05/04/2013 Elsevier Interactive Patient Education 2016 Elsevier Inc.  Influenza Virus Vaccine injection (Fluarix) What is this medicine? INFLUENZA VIRUS VACCINE (in floo EN zuh VAHY ruhs vak SEEN) helps to reduce the risk of getting influenza also known as the flu. This medicine may be used for other purposes; ask your health care provider or pharmacist if you have questions. What should I tell my health care provider before I take this medicine? They need to know if you have any of these conditions: -bleeding disorder like hemophilia -fever or infection -Guillain-Barre syndrome or other neurological problems -immune system problems -infection with  the human immunodeficiency virus (HIV) or AIDS -low blood platelet counts -multiple sclerosis -an unusual or allergic reaction to influenza virus vaccine, eggs, chicken proteins, latex, gentamicin, other medicines, foods, dyes or preservatives -pregnant or trying to  get pregnant -breast-feeding How should I use this medicine? This vaccine is for injection into a muscle. It is given by a health care professional. A copy of Vaccine Information Statements will be given before each vaccination. Read this sheet carefully each time. The sheet may change frequently. Talk to your pediatrician regarding the use of this medicine in children. Special care may be needed. Overdosage: If you think you have taken too much of this medicine contact a poison control center or emergency room at once. NOTE: This medicine is only for you. Do not share this medicine with others. What if I miss a dose? This does not apply. What may interact with this medicine? -chemotherapy or radiation therapy -medicines that lower your immune system like etanercept, anakinra, infliximab, and adalimumab -medicines that treat or prevent blood clots like warfarin -phenytoin -steroid medicines like prednisone or cortisone -theophylline -vaccines This list may not describe all possible interactions. Give your health care provider a list of all the medicines, herbs, non-prescription drugs, or dietary supplements you use. Also tell them if you smoke, drink alcohol, or use illegal drugs. Some items may interact with your medicine. What should I watch for while using this medicine? Report any side effects that do not go away within 3 days to your doctor or health care professional. Call your health care provider if any unusual symptoms occur within 6 weeks of receiving this vaccine. You may still catch the flu, but the illness is not usually as bad. You cannot get the flu from the vaccine. The vaccine will not protect against colds or  other illnesses that may cause fever. The vaccine is needed every year. What side effects may I notice from receiving this medicine? Side effects that you should report to your doctor or health care professional as soon as possible: -allergic reactions like skin rash, itching or hives, swelling of the face, lips, or tongue Side effects that usually do not require medical attention (report to your doctor or health care professional if they continue or are bothersome): -fever -headache -muscle aches and pains -pain, tenderness, redness, or swelling at site where injected -weak or tired This list may not describe all possible side effects. Call your doctor for medical advice about side effects. You may report side effects to FDA at 1-800-FDA-1088. Where should I keep my medicine? This vaccine is only given in a clinic, pharmacy, doctor's office, or other health care setting and will not be stored at home. NOTE: This sheet is a summary. It may not cover all possible information. If you have questions about this medicine, talk to your doctor, pharmacist, or health care provider.    2016, Elsevier/Gold Standard. (2007-07-23 09:30:40)

## 2015-11-08 NOTE — Progress Notes (Signed)
Pt has a little pain in chest and heart chest pain score is 2 and heart is 1 Pt mentioned he has 2 white specks on his heart from the CT scan  Pt has been taking Aleve since he is out of Tylenol 500mg 

## 2015-11-08 NOTE — Progress Notes (Signed)
Pt is in the office today for hypertension

## 2015-11-09 LAB — CMP AND LIVER
ALBUMIN: 4.3 g/dL (ref 3.6–5.1)
ALK PHOS: 54 U/L (ref 40–115)
ALT: 29 U/L (ref 9–46)
AST: 56 U/L — ABNORMAL HIGH (ref 10–35)
BILIRUBIN DIRECT: 0.2 mg/dL (ref <=0.2)
BILIRUBIN INDIRECT: 0.2 mg/dL (ref 0.2–1.2)
BILIRUBIN TOTAL: 0.4 mg/dL (ref 0.2–1.2)
BUN: 6 mg/dL — ABNORMAL LOW (ref 7–25)
CALCIUM: 9.5 mg/dL (ref 8.6–10.3)
CO2: 23 mmol/L (ref 20–31)
Chloride: 101 mmol/L (ref 98–110)
Creat: 0.76 mg/dL (ref 0.70–1.33)
Glucose, Bld: 82 mg/dL (ref 65–99)
POTASSIUM: 3.9 mmol/L (ref 3.5–5.3)
SODIUM: 137 mmol/L (ref 135–146)
TOTAL PROTEIN: 7.4 g/dL (ref 6.1–8.1)

## 2015-11-10 ENCOUNTER — Telehealth: Payer: Self-pay | Admitting: Internal Medicine

## 2015-11-10 ENCOUNTER — Telehealth: Payer: Self-pay

## 2015-11-10 NOTE — Telephone Encounter (Signed)
Contacted pt to go over lab results pt didn't answer lvm asking pt to give me a call back at his earliest convenience  

## 2015-11-10 NOTE — Telephone Encounter (Signed)
Pt returning call from nurse Pt states he gives authorization to share information with [facility]

## 2015-11-21 MED FILL — ?AMLODIPINE BESYLATE 5 MG T: 5 | 30 days supply | Qty: 30 | Fill #0

## 2015-12-09 ENCOUNTER — Encounter: Payer: Self-pay | Admitting: Internal Medicine

## 2015-12-13 ENCOUNTER — Telehealth: Payer: Self-pay | Admitting: Internal Medicine

## 2015-12-13 NOTE — Telephone Encounter (Signed)
Forward to pcp

## 2015-12-13 NOTE — Telephone Encounter (Signed)
Called pt, confirmed dob. Pt had questions about disability, recommend goes first to go to social security office. Per pt and brother, [facility] so they are going the lawyer route now. Needs referral to financial aid first to get financial aid, than can get ref pulm if need. Per pt, his lawyers advised him to see pulm for more documentation.

## 2015-12-13 NOTE — Telephone Encounter (Signed)
Pt. Called requesting to speak with his PCP. Pt. Just stated that he has some questions to ask her.  Please f/u

## 2015-12-21 MED FILL — AMLODIPINE BESYLATE 5 MG TA: 5 | 30 days supply | Qty: 30 | Fill #1

## 2016-01-04 ENCOUNTER — Ambulatory Visit: Payer: Self-pay

## 2016-01-16 ENCOUNTER — Ambulatory Visit: Payer: Self-pay | Attending: Internal Medicine

## 2016-01-23 MED FILL — AMLODIPINE BESYLATE 5 MG TA: 5 | 30 days supply | Qty: 30 | Fill #2

## 2016-02-07 ENCOUNTER — Other Ambulatory Visit: Payer: Self-pay | Admitting: Pharmacist

## 2016-02-07 ENCOUNTER — Encounter: Payer: Self-pay | Admitting: Internal Medicine

## 2016-02-07 ENCOUNTER — Ambulatory Visit: Payer: Self-pay | Attending: Internal Medicine | Admitting: Internal Medicine

## 2016-02-07 VITALS — BP 152/97 | HR 87 | Temp 97.5°F | Resp 16 | Wt 141.4 lb

## 2016-02-07 DIAGNOSIS — F1010 Alcohol abuse, uncomplicated: Secondary | ICD-10-CM | POA: Insufficient documentation

## 2016-02-07 DIAGNOSIS — Z1211 Encounter for screening for malignant neoplasm of colon: Secondary | ICD-10-CM

## 2016-02-07 DIAGNOSIS — F17210 Nicotine dependence, cigarettes, uncomplicated: Secondary | ICD-10-CM | POA: Insufficient documentation

## 2016-02-07 DIAGNOSIS — Z125 Encounter for screening for malignant neoplasm of prostate: Secondary | ICD-10-CM

## 2016-02-07 DIAGNOSIS — Z23 Encounter for immunization: Secondary | ICD-10-CM

## 2016-02-07 DIAGNOSIS — Z1329 Encounter for screening for other suspected endocrine disorder: Secondary | ICD-10-CM

## 2016-02-07 DIAGNOSIS — Z720 Tobacco use: Secondary | ICD-10-CM

## 2016-02-07 DIAGNOSIS — J449 Chronic obstructive pulmonary disease, unspecified: Secondary | ICD-10-CM | POA: Insufficient documentation

## 2016-02-07 DIAGNOSIS — R918 Other nonspecific abnormal finding of lung field: Secondary | ICD-10-CM

## 2016-02-07 DIAGNOSIS — Z131 Encounter for screening for diabetes mellitus: Secondary | ICD-10-CM

## 2016-02-07 DIAGNOSIS — I10 Essential (primary) hypertension: Secondary | ICD-10-CM | POA: Insufficient documentation

## 2016-02-07 DIAGNOSIS — F419 Anxiety disorder, unspecified: Secondary | ICD-10-CM | POA: Insufficient documentation

## 2016-02-07 LAB — CBC WITH DIFFERENTIAL/PLATELET
BASOS ABS: 60 {cells}/uL (ref 0–200)
Basophils Relative: 1 %
EOS ABS: 240 {cells}/uL (ref 15–500)
Eosinophils Relative: 4 %
HCT: 40.9 % (ref 38.5–50.0)
Hemoglobin: 13.7 g/dL (ref 13.2–17.1)
LYMPHS PCT: 33 %
Lymphs Abs: 1980 cells/uL (ref 850–3900)
MCH: 34.3 pg — AB (ref 27.0–33.0)
MCHC: 33.5 g/dL (ref 32.0–36.0)
MCV: 102.3 fL — AB (ref 80.0–100.0)
MPV: 11.6 fL (ref 7.5–12.5)
Monocytes Absolute: 660 cells/uL (ref 200–950)
Monocytes Relative: 11 %
Neutro Abs: 3060 cells/uL (ref 1500–7800)
Neutrophils Relative %: 51 %
PLATELETS: 284 10*3/uL (ref 140–400)
RBC: 4 MIL/uL — ABNORMAL LOW (ref 4.20–5.80)
RDW: 13.1 % (ref 11.0–15.0)
WBC: 6 10*3/uL (ref 3.8–10.8)

## 2016-02-07 LAB — CMP AND LIVER
ALT: 33 U/L (ref 9–46)
AST: 47 U/L — ABNORMAL HIGH (ref 10–35)
Albumin: 4.4 g/dL (ref 3.6–5.1)
Alkaline Phosphatase: 44 U/L (ref 40–115)
BILIRUBIN TOTAL: 0.4 mg/dL (ref 0.2–1.2)
BUN: 6 mg/dL — ABNORMAL LOW (ref 7–25)
Bilirubin, Direct: 0.1 mg/dL (ref <=0.2)
CO2: 24 mmol/L (ref 20–31)
CREATININE: 0.86 mg/dL (ref 0.70–1.33)
Calcium: 10.1 mg/dL (ref 8.6–10.3)
Chloride: 98 mmol/L (ref 98–110)
GLUCOSE: 76 mg/dL (ref 65–99)
Indirect Bilirubin: 0.3 mg/dL (ref 0.2–1.2)
Potassium: 4.8 mmol/L (ref 3.5–5.3)
SODIUM: 133 mmol/L — AB (ref 135–146)
TOTAL PROTEIN: 7.9 g/dL (ref 6.1–8.1)

## 2016-02-07 LAB — PSA: PSA: 0.6 ng/mL (ref <=4.0)

## 2016-02-07 LAB — POCT GLYCOSYLATED HEMOGLOBIN (HGB A1C): Hemoglobin A1C: 5.2

## 2016-02-07 LAB — TSH: TSH: 1.88 mIU/L (ref 0.40–4.50)

## 2016-02-07 MED ORDER — AMLODIPINE BESYLATE 10 MG PO TABS: 10.0000 mg | ORAL_TABLET | Freq: Every day | ORAL | 3 refills | Status: DC

## 2016-02-07 MED ORDER — FLUTICASONE FUROATE-VILANTEROL 100-25 MCG/INH IN AEPB: 1.0000 | INHALATION_SPRAY | Freq: Every day | RESPIRATORY_TRACT | 0 refills | Status: DC

## 2016-02-07 MED ORDER — BUDESONIDE-FORMOTEROL FUMARATE 80-4.5 MCG/ACT IN AERO: 2.0000 | INHALATION_SPRAY | Freq: Two times a day (BID) | RESPIRATORY_TRACT | 3 refills | Status: DC

## 2016-02-07 MED ORDER — ALBUTEROL SULFATE HFA 108 (90 BASE) MCG/ACT IN AERS: 2.0000 | INHALATION_SPRAY | Freq: Four times a day (QID) | RESPIRATORY_TRACT | 2 refills | Status: DC | PRN

## 2016-02-07 MED ORDER — BUPROPION HCL ER (SR) 150 MG PO TB12: 150.0000 mg | ORAL_TABLET | Freq: Two times a day (BID) | ORAL | 3 refills | Status: DC

## 2016-02-07 MED FILL — ?BUPROPION HCL SR 150 MG TA: 150 | 30 days supply | Qty: 60 | Fill #0

## 2016-02-07 MED FILL — !VENTOLIN HFA INHALER: 108 (90 BAS | 20 days supply | Qty: 18 | Fill #0

## 2016-02-07 MED FILL — SYMBICORT 80-4.5 MCG INH: 80-4.5 | 20 days supply | Qty: 10 | Fill #0

## 2016-02-07 NOTE — Progress Notes (Signed)
Brent RopesRonnie Warren, is a 56 y.o. male  ZOX:[id number]  [id number]  DOB - 05/17/1960  Chief Complaint  Patient presents with  . Hypertension        Subjective:   Brent Warren is a 56 y.o. male here today for a follow up visit., for htn, copd, tob and etoh abuse, hx of  Right empyema sp VATS 5/17, with findings of multiple lung nodules on CT scan 9/17.  He is back to smoking 1ppd, drinks 2 beers qd to help w/ his anxiety. He never picked up the rx for welbutrin to help him w/ his anxiety/tob cesssation. Interested in it now.  Using his inhalers regularly w/o complaints. Appetite improved and eating better.    Patient has No headache, No chest pain, No abdominal pain - No Nausea, No new weakness tingling or numbness, No Cough - SOB.  bms regular.  No problems updated.  ALLERGIES: No Known Allergies  PAST MEDICAL HISTORY: Past Medical History:  Diagnosis Date  . Alcoholic (HCC)   . 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 HTN (hypertension)   . Pneumonia 05/2015  . Tobacco abuse     MEDICATIONS AT HOME: Prior to Admission medications   Medication Sig Start Date End Date Taking? Authorizing Provider  acetaminophen (TYLENOL) 500 MG tablet Take 1,000 mg by mouth every 6 (six) hours as needed for moderate pain.    Historical Provider, MD  albuterol (PROVENTIL HFA;VENTOLIN HFA) 108 (90 Base) MCG/ACT inhaler Inhale 2 puffs into the lungs every 6 (six) hours as needed for wheezing or shortness of breath. 02/07/16   Pete Glatterawn T Langeland, MD  albuterol (PROVENTIL) (2.5 MG/3ML) 0.083% nebulizer solution Take 3 mLs (2.5 mg total) by nebulization every 6 (six) hours as needed for wheezing or shortness of breath. 05/31/15   Pete Glatterawn T Langeland, MD  amLODipine (NORVASC) 10 MG tablet Take 1 tablet (10 mg total) by mouth daily. 02/07/16   Pete Glatterawn T Langeland, MD  budesonide-formoterol (SYMBICORT) 80-4.5 MCG/ACT inhaler Inhale 2 puffs into the lungs 2 (two) times daily. Rinse mouth out after use 02/07/16    Pete Glatterawn T Langeland, MD  buPROPion [facility](WELLBUTRIN SR) 150 MG 12 hr tablet Take 1 tablet (150 mg total) by mouth 2 (two) times daily. 02/07/16   Pete Glatterawn T Langeland, MD  diclofenac sodium (VOLTAREN) 1 % GEL Apply 2 g topically 4 (four) times daily. Patient not taking: Reported on 11/08/2015 06/20/15   Pete Glatterawn T Langeland, MD  nicotine (NICODERM CQ) 7 mg/24hr patch Place 1 patch (7 mg total) onto the skin daily. Patient not taking: Reported on 11/08/2015 08/01/15   Pete Glatterawn T Langeland, MD     Objective:   Vitals:   02/07/16 0955  BP: (!) 152/97  Pulse: 87  Resp: 16  Temp: 97.5 F (36.4 C)  TempSrc: Oral  SpO2: 98%  Weight: 141 lb 6.4 oz (64.1 kg)    Exam General appearance : Awake, alert, not in any distress. Speech Clear. Not toxic looking, pleasant. HEENT: Atraumatic and Normocephalic, pupils equally reactive to light. Neck: supple, no JVD.  Chest:Good air entry bilaterally,  Course breath sounds throughout, ,but no w/c. CVS: S1 S2 regular, no murmurs/gallups or rubs. Abdomen: Bowel sounds active, Non tender and not distended with no gaurding, rigidity or rebound. Extremities: B/L Lower Ext shows no edema, both legs are warm to touch Neurology: Awake alert, and oriented X 3, CN II-XII grossly intact, Non focal Skin:No Rash  Data Review No results found for: HGBA1C  Depression screen  [facility] 2/9 02/07/2016 11/08/2015 09/08/2015 08/01/2015 06/20/2015  Decreased Interest 1 1 1  - 0  Down, Depressed, Hopeless 2 1 2  0 0  PHQ - 2 Score 3 2 3  0 0  Altered sleeping 2 3 1  - -  Tired, decreased energy 2 1 1  - -  Change in appetite 1 0 0 - -  Feeling bad or failure about yourself  1 3 1  - -  Trouble concentrating 1 1 0 - -  Moving slowly or fidgety/restless 2 2 1  - -  Suicidal thoughts 0 0 0 - -  PHQ-9 Score 12 12 7  - -      Assessment & Plan   1. Essential hypertension Elevated, goals <130/80 - low salt diet discussed at length, fresh/frozen veg better than canned goods - CMP and Liver - CBC  with Differential - increased norvasc from 5 to 10mg  qd - amLODipine (NORVASC) 10 MG tablet; Take 1 tablet (10 mg total) by mouth daily.  Dispense: 90 tablet; Refill: 3 - gave pt info on free veg/food box that occurs in our parking lot every 4th Tues. - stop smoking advised  2. Tobacco abuse Total cessation recd -For your tobacco abuse: Strongly recommend that he stop smoking back and all other inhaled products right away Call 1-800-Quit-Now to get free nicotine replacement from the state of [place]  - trial welbutrin 150bid (he never picked up the rx prior)  3. ETOH abuse Drinks 2 drinks daily /beer, for anxiety. Advised total cessation  hopefully welbutrin will help w/ his anxiety  4. Diabetes mellitus screening - HgB A1c 5.2  5. Prostate cancer screening - PSA  6. Thyroid disorder screen - TSH  7. Lung nodule, multiple Noted on prior ct 9/17 - CT CHEST NODULE FOLLOW UP LOW DOSE W/O; Future  8. Hx of right empyema, sp VATS 5/17    Patient have been counseled extensively about nutrition and exercise  Return in about 3 months (around 05/07/2016), or if symptoms worsen or fail to improve.  The patient was given clear instructions to go to ER or return to medical center if symptoms don't improve, worsen or new problems develop. The patient verbalized understanding. The patient was told to call to get lab results if they haven't heard anything in the next week.   This note has been created with Education officer, environmental. Any transcriptional errors are unintentional.   Pete Glatter, MD, MBA/MHA [facility] [place], [place] [phone]   02/07/2016, 10:11 AM

## 2016-02-07 NOTE — Patient Instructions (Addendum)
- 4th Tuesday every month - our parking lot for free box of vegitables/meats/food -  Next date: Feb 27 2:30-4pm  Symbicort - inhaler, - use 2 times a day - rinse mouth out after use Albuterol inhaler - use as needed every 4- 6 hr for shortness of breath  For your tobacco abuse: Strongly recommend that he stop smoking back and all other inhaled products right away Call 1-800-Quit-Now to get free nicotine replacement from the state of [place]    Low-Sodium Eating Plan Sodium raises blood pressure and causes water to be held in the body. Getting less sodium from food will help lower your blood pressure, reduce any swelling, and protect your heart, liver, and kidneys. We get sodium by adding salt (sodium chloride) to food. Most of our sodium comes from canned, boxed, and frozen foods. Restaurant foods, fast foods, and pizza are also very high in sodium. Even if you take medicine to lower your blood pressure or to reduce fluid in your body, getting less sodium from your food is important. What is my plan? Most people should limit their sodium intake to 2,300 mg a day. Your health care provider recommends that you limit your sodium intake to 000mg _ a day. What do I need to know about this eating plan? For the low-sodium eating plan, you will follow these general guidelines:  Choose foods with a % Daily Value for sodium of less than 5% (as listed on the food label).  Use salt-free seasonings or herbs instead of table salt or sea salt.  Check with your health care provider or pharmacist before using salt substitutes.  Eat fresh foods.  Eat more vegetables and fruits.  Limit canned vegetables. If you do use them, rinse them well to decrease the sodium.  Limit cheese to 1 oz (28 g) per day.  Eat lower-sodium products, often labeled as "lower sodium" or "no salt added."  Avoid foods that contain monosodium glutamate (MSG). MSG is sometimes added to [place] food and some canned foods.  Check  food labels (Nutrition Facts labels) on foods to learn how much sodium is in one serving.  Eat more home-cooked food and less restaurant, buffet, and fast food.  When eating at a restaurant, ask that your food be prepared with less salt, or no salt if possible. How do I read food labels for sodium information? The Nutrition Facts label lists the amount of sodium in one serving of the food. If you eat more than one serving, you must multiply the listed amount of sodium by the number of servings. Food labels may also identify foods as:  Sodium free-Less than 5 mg in a serving.  Very low sodium-35 mg or less in a serving.  Low sodium-140 mg or less in a serving.  Light in sodium-50% less sodium in a serving. For example, if a food that usually has 300 mg of sodium is changed to become light in sodium, it will have 150 mg of sodium.  Reduced sodium-25% less sodium in a serving. For example, if a food that usually has 400 mg of sodium is changed to reduced sodium, it will have 300 mg of sodium. What foods can I eat? Grains  Low-sodium cereals, including oats, puffed wheat and rice, and shredded wheat cereals. Low-sodium crackers. Unsalted rice and pasta. Lower-sodium bread. Vegetables  Frozen or fresh vegetables. Low-sodium or reduced-sodium canned vegetables. Low-sodium or reduced-sodium tomato sauce and paste. Low-sodium or reduced-sodium tomato and vegetable juices. Fruits  Fresh, frozen, and  canned fruit. Fruit juice. Meat and Other Protein Products  Low-sodium canned tuna and salmon. Fresh or frozen meat, poultry, seafood, and fish. Lamb. Unsalted nuts. Dried beans, peas, and lentils without added salt. Unsalted canned beans. Homemade soups without salt. Eggs. Dairy  Milk. Soy milk. Ricotta cheese. Low-sodium or reduced-sodium cheeses. Yogurt. Condiments  Fresh and dried herbs and spices. Salt-free seasonings. Onion and garlic powders. Low-sodium varieties of mustard and ketchup. Fresh  or refrigerated horseradish. Lemon juice. Fats and Oils  Reduced-sodium salad dressings. Unsalted butter. Other  Unsalted popcorn and pretzels. The items listed above may not be a complete list of recommended foods or beverages. Contact your dietitian for more options.  What foods are not recommended? Grains  Instant hot cereals. Bread stuffing, pancake, and biscuit mixes. Croutons. Seasoned rice or pasta mixes. Noodle soup cups. Boxed or frozen macaroni and cheese. Self-rising flour. Regular salted crackers. Vegetables  Regular canned vegetables. Regular canned tomato sauce and paste. Regular tomato and vegetable juices. Frozen vegetables in sauces. Salted [place] fries. Olives. Rosita Fire. Relishes. Sauerkraut. Salsa. Meat and Other Protein Products  Salted, canned, smoked, spiced, or pickled meats, seafood, or fish. Bacon, ham, sausage, hot dogs, corned beef, chipped beef, and packaged luncheon meats. Salt pork. Jerky. Pickled herring. Anchovies, regular canned tuna, and sardines. Salted nuts. Dairy  Processed cheese and cheese spreads. Cheese curds. Blue cheese and cottage cheese. Buttermilk. Condiments  Onion and garlic salt, seasoned salt, table salt, and sea salt. Canned and packaged gravies. Worcestershire sauce. Tartar sauce. Barbecue sauce. Teriyaki sauce. Soy sauce, including reduced sodium. Steak sauce. Fish sauce. Oyster sauce. Cocktail sauce. Horseradish that you find on the shelf. Regular ketchup and mustard. Meat flavorings and tenderizers. Bouillon cubes. Hot sauce. Tabasco sauce. Marinades. Taco seasonings. Relishes. Fats and Oils  Regular salad dressings. Salted butter. Margarine. Ghee. Bacon fat. Other  Potato and tortilla chips. Corn chips and puffs. Salted popcorn and pretzels. Canned or dried soups. Pizza. Frozen entrees and pot pies. The items listed above may not be a complete list of foods and beverages to avoid. Contact your dietitian for more information.  This  information is not intended to replace advice given to you by your health care provider. Make sure you discuss any questions you have with your health care provider. Document Released: 06/16/2001 Document Revised: 06/02/2015 Document Reviewed: 10/29/2012 Elsevier Interactive Patient Education  2017 [employer].  -  Steps to Quit Smoking Smoking tobacco can be bad for your health. It can also affect almost every organ in your body. Smoking puts you and people around you at risk for many serious long-lasting (chronic) diseases. Quitting smoking is hard, but it is one of the best things that you can do for your health. It is never too late to quit. What are the benefits of quitting smoking? When you quit smoking, you lower your risk for getting serious diseases and conditions. They can include:  Lung cancer or lung disease.  Heart disease.  Stroke.  Heart attack.  Not being able to have children (infertility).  Weak bones (osteoporosis) and broken bones (fractures). If you have coughing, wheezing, and shortness of breath, those symptoms may get better when you quit. You may also get sick less often. If you are pregnant, quitting smoking can help to lower your chances of having a baby of low birth weight. What can I do to help me quit smoking? Talk with your doctor about what can help you quit smoking. Some things you can do (strategies) include:  Quitting smoking totally, instead of slowly cutting back how much you smoke over a period of time.  Going to in-person counseling. You are more likely to quit if you go to many counseling sessions.  Using resources and support systems, such as:  Online chats with a Veterinary surgeoncounselor.  Phone quitlines.  Printed Materials engineerself-help materials.  Support groups or group counseling.  Text messaging programs.  Mobile phone apps or applications.  Taking medicines. Some of these medicines may have nicotine in them. If you are pregnant or breastfeeding, do  not take any medicines to quit smoking unless your doctor says it is okay. Talk with your doctor about counseling or other things that can help you. Talk with your doctor about using more than one strategy at the same time, such as taking medicines while you are also going to in-person counseling. This can help make quitting easier. What things can I do to make it easier to quit? Quitting smoking might feel very hard at first, but there is a lot that you can do to make it easier. Take these steps:  Talk to your family and friends. Ask them to support and encourage you.  Call phone quitlines, reach out to support groups, or work with a Veterinary surgeoncounselor.  Ask people who smoke to not smoke around you.  Avoid places that make you want (trigger) to smoke, such as:  Bars.  Parties.  Smoke-break areas at work.  Spend time with people who do not smoke.  Lower the stress in your life. Stress can make you want to smoke. Try these things to help your stress:  Getting regular exercise.  Deep-breathing exercises.  Yoga.  Meditating.  Doing a body scan. To do this, close your eyes, focus on one area of your body at a time from head to toe, and notice which parts of your body are tense. Try to relax the muscles in those areas.  Download or buy apps on your mobile phone or tablet that can help you stick to your quit plan. There are many free apps, such as QuitGuide from the [employer] developer(Centers for Disease Control and Prevention). You can find more support from smokefree.gov and other websites. This information is not intended to replace advice given to you by your health care provider. Make sure you discuss any questions you have with your health care provider. Document Released: 10/21/2008 Document Revised: 08/23/2015 Document Reviewed: 05/11/2014 Elsevier Interactive Patient Education  2017 Elsevier Inc.   - Alcohol Abuse and Nutrition Alcohol abuse is any pattern of alcohol consumption that harms your  health, relationships, or work. Alcohol abuse can affect how your body breaks down and absorbs nutrients from food by causing your liver to work abnormally. Additionally, many people who abuse alcohol do not eat enough carbohydrates, protein, fat, vitamins, and minerals. This can cause poor nutrition (malnutrition) and a lack of nutrients (nutrient deficiencies), which can lead to further complications. Nutrients that are commonly lacking (deficient) among people who abuse alcohol include:  Vitamins.  Vitamin A. This is stored in your liver. It is important for your vision, metabolism, and ability to fight off infections (immunity).  B vitamins. These include vitamins such as folate, thiamin, and niacin. These are important in new cell growth and maintenance.  Vitamin C. This plays an important role in iron absorption, wound healing, and immunity.  Vitamin D. This is produced by your liver, but you can also get vitamin D from food. Vitamin D is necessary for your body to absorb  and use calcium.  Minerals.  Calcium. This is important for your bones and your heart and blood vessel (cardiovascular) function.  Iron. This is important for blood, muscle, and nervous system functioning.  Magnesium. This plays an important role in muscle and nerve function, and it helps to control blood sugar and blood pressure.  Zinc. This is important for the normal function of your nervous system and digestive system (gastrointestinal tract). Nutrition is an essential component of therapy for alcohol abuse. Your health care provider or dietitian will work with you to design a plan that can help restore nutrients to your body and prevent potential complications. What is my plan? Your dietitian may develop a specific diet plan that is based on your condition and any other complications you may have. A diet plan will commonly include:  A balanced diet.  Grains: 6-8 oz per day.  Vegetables: 2-3 cups per  day.  Fruits: 1-2 cups per day.  Meat and other protein: 5-6 oz per day.  Dairy: 2-3 cups per day.  Vitamin and mineral supplements. What do I need to know about alcohol and nutrition?  Consume foods that are high in antioxidants, such as grapes, berries, nuts, green tea, and dark green and orange vegetables. This can help to counteract some of the stress that is placed on your liver by consuming alcohol.  Avoid food and drinks that are high in fat and sugar. Foods such as sugared soft drinks, salty snack foods, and candy contain empty calories. This means that they lack important nutrients such as protein, fiber, and vitamins.  Eat frequent meals and snacks. Try to eat 5-6 small meals each day.  Eat a variety of fresh fruits and vegetables each day. This will help you get plenty of water, fiber, and vitamins in your diet.  Drink plenty of water and other clear fluids. Try to drink at least 48-64 oz (1.5-2 L) of water per day.  If you are a vegetarian, eat a variety of protein-rich foods. Pair whole grains with plant-based proteins at meals and snacks to obtain the greatest nutrient benefit from your food. For example, eat rice with beans, put peanut butter on whole-grain toast, or eat oatmeal with sunflower seeds.  Soak beans and whole grains overnight before cooking. This can help your body to absorb the nutrients more easily.  Include foods fortified with vitamins and minerals in your diet. Commonly fortified foods include milk, orange juice, cereal, and bread.  If you are malnourished, your dietitian may recommend a high-protein, high-calorie diet. This may include:  2,000-3,000 calories (kilocalories) per day.  70-100 grams of protein per day.  Your health care provider may recommend a complete nutritional supplement beverage. This can help to restore calories, protein, and vitamins to your body. Depending on your condition, you may be advised to consume this instead of or in  addition to meals.  Limit your intake of caffeine. Replace drinks like coffee and black tea with decaffeinated coffee and herbal tea.  Eat a variety of foods that are high in omega fatty acids. These include fish, nuts and seeds, and soybeans. These foods may help your liver to recover and may also stabilize your mood.  Certain medicines may cause changes in your appetite, taste, and weight. Work with your health care provider and dietitian to make any adjustments to your medicines and diet plan.  Include other healthy lifestyle choices in your daily routine.  Be physically active.  Get enough sleep.  Spend time doing  activities that you enjoy.  If you are unable to take in enough food and calories by mouth, your health care provider may recommend a feeding tube. This is a tube that passes through your nose and throat, directly into your stomach. Nutritional supplement beverages can be given to you through the feeding tube to help you get the nutrients you need.  Take vitamin or mineral supplements as recommended by your health care provider. What foods can I eat? Grains  Enriched pasta. Enriched rice. Fortified whole-grain bread. Fortified whole-grain cereal. Barley. Brown rice. Quinoa. Millet. Vegetables  All fresh, frozen, and canned vegetables. Spinach. Kale. Artichoke. Carrots. Winter squash and pumpkin. Sweet potatoes. Broccoli. Cabbage. Cucumbers. Tomatoes. Sweet peppers. Green beans. Peas. Corn. Fruits  All fresh and frozen fruits. Berries. Grapes. Mango. Papaya. Guava. Cherries. Apples. Bananas. Peaches. Plums. Pineapple. Watermelon. Cantaloupe. Oranges. Avocado. Meats and Other Protein Sources  Beef liver. Lean beef. Pork. Fresh and canned chicken. Fresh fish. Oysters. Sardines. Canned tuna. Shrimp. Eggs with yolks. Nuts and seeds. Peanut butter. Beans and lentils. Soybeans. Tofu. Dairy  Whole, low-fat, and nonfat milk. Whole, low-fat, and nonfat yogurt. Cottage cheese. Sour  cream. Hard and soft cheeses. Beverages  Water. Herbal tea. Decaffeinated coffee. Decaffeinated green tea. 100% fruit juice. 100% vegetable juice. Instant breakfast shakes. Condiments  Ketchup. Mayonnaise. Mustard. Salad dressing. Barbecue sauce. Sweets and Desserts  Sugar-free ice cream. Sugar-free pudding. Sugar-free gelatin. Fats and Oils  Butter. Vegetable oil, flaxseed oil, olive oil, and walnut oil. Other  Complete nutrition shakes. Protein bars. Sugar-free gum. The items listed above may not be a complete list of recommended foods or beverages. Contact your dietitian for more options.  What foods are not recommended? Grains  Sugar-sweetened breakfast cereals. Flavored instant oatmeal. Fried breads. Vegetables  Breaded or deep-fried vegetables. Fruits  Dried fruit with added sugar. Candied fruit. Canned fruit in syrup. Meats and Other Protein Sources  Breaded or deep-fried meats. Dairy  Flavored milks. Fried cheese curds or fried cheese sticks. Beverages  Alcohol. Sugar-sweetened soft drinks. Sugar-sweetened tea. Caffeinated coffee and tea. Condiments  Sugar. Honey. Agave nectar. Molasses. Sweets and Desserts  Chocolate. Cake. Cookies. Candy. Other  Potato chips. Pretzels. Salted nuts. Candied nuts. The items listed above may not be a complete list of foods and beverages to avoid. Contact your dietitian for more information.  This information is not intended to replace advice given to you by your health care provider. Make sure you discuss any questions you have with your health care provider. Document Released: 10/19/2004 Document Revised: 05/04/2015 Document Reviewed: 07/28/2013 Elsevier Interactive Patient Education  2017 Elsevier Inc.  - Td Vaccine (Tetanus and Diphtheria): What You Need to Know 1. Why get vaccinated? Tetanus  and diphtheria are very serious diseases. They are rare in the [place] today, but people who do become infected often have severe  complications. Td vaccine is used to protect adolescents and adults from both of these diseases. Both tetanus and diphtheria are infections caused by bacteria. Diphtheria spreads from person to person through coughing or sneezing. Tetanus-causing bacteria enter the body through cuts, scratches, or wounds. TETANUS (lockjaw) causes painful muscle tightening and stiffness, usually all over the body.  It can lead to tightening of muscles in the head and neck so you can't open your mouth, swallow, or sometimes even breathe. Tetanus kills about 1 out of every 10 people who are infected even after receiving the best medical care. DIPHTHERIA can cause a thick coating to form in the back  of the throat.  It can lead to breathing problems, paralysis, heart failure, and death. Before vaccines, as many as 200,000 cases of diphtheria and hundreds of cases of tetanus were reported in the [place] each year. Since vaccination began, reports of cases for both diseases have dropped by about 99%. 2. Td vaccine Td vaccine can protect adolescents and adults from tetanus and diphtheria. Td is usually given as a booster dose every 10 years but it can also be given earlier after a severe and dirty wound or burn. Another vaccine, called Tdap, which protects against pertussis in addition to tetanus and diphtheria, is sometimes recommended instead of Td vaccine. Your doctor or the person giving you the vaccine can give you more information. Td may safely be given at the same time as other vaccines. 3. Some people should not get this vaccine  A person who has ever had a life-threatening allergic reaction after a previous dose of any tetanus or diphtheria containing vaccine, OR has a severe allergy to any part of this vaccine, should not get Td vaccine. Tell the person giving the vaccine about any severe allergies.  Talk to your doctor if you:  had severe pain or swelling after any vaccine containing diphtheria or  tetanus,  ever had a condition called Guillain Barre Syndrome (GBS),  aren't feeling well on the day the shot is scheduled. 4. What are the risks from Td vaccine? With any medicine, including vaccines, there is a chance of side effects. These are usually mild and go away on their own. Serious reactions are also possible but are rare. Most people who get Td vaccine do not have any problems with it. Mild problems following Td vaccine: (Did not interfere with activities)  Pain where the shot was given (about 8 people in 10)  Redness or swelling where the shot was given (about 1 person in 4)  Mild fever (rare)  Headache (about 1 person in 4)  Tiredness (about 1 person in 4) Moderate problems following Td vaccine: (Interfered with activities, but did not require medical attention)  Fever over 102F (rare) Severe problems following Td vaccine: (Unable to perform usual activities; required medical attention)  Swelling, severe pain, bleeding and/or redness in the arm where the shot was given (rare). Problems that could happen after any vaccine:  People sometimes faint after a medical procedure, including vaccination. Sitting or lying down for about 15 minutes can help prevent fainting, and injuries caused by a fall. Tell your doctor if you feel dizzy, or have vision changes or ringing in the ears.  Some people get severe pain in the shoulder and have difficulty moving the arm where a shot was given. This happens very rarely.  Any medication can cause a severe allergic reaction. Such reactions from a vaccine are very rare, estimated at fewer than 1 in a million doses, and would happen within a few minutes to a few hours after the vaccination. As with any medicine, there is a very remote chance of a vaccine causing a serious injury or death. The safety of vaccines is always being monitored. For more information, visit: http://floyd.org/ 5. What if there is a serious  reaction? What should I look for? Look for anything that concerns you, such as signs of a severe allergic reaction, very high fever, or unusual behavior. Signs of a severe allergic reaction can include hives, swelling of the face and throat, difficulty breathing, a fast heartbeat, dizziness, and weakness. These would usually start a few  minutes to a few hours after the vaccination. What should I do?  If you think it is a severe allergic reaction or other emergency that can't wait, call 9-1-1 or get the person to the nearest hospital. Otherwise, call your doctor.  Afterward, the reaction should be reported to the Vaccine Adverse Event Reporting System (VAERS). Your doctor might file this report, or you can do it yourself through the VAERS web site at www.vaers.LAgents.no, or by calling 1-972-577-1880.  VAERS does not give medical advice. 6. The National Vaccine Injury Compensation Program The [employer] Vaccine Injury Compensation Program (VICP) is a federal program that was created to compensate people who may have been injured by certain vaccines. Persons who believe they may have been injured by a vaccine can learn about the program and about filing a claim by calling 1-604-073-7929 or visiting the VICP website at SpiritualWord.at. There is a time limit to file a claim for compensation. 7. How can I learn more?  Ask your doctor. He or she can give you the vaccine package insert or suggest other sources of information.  Call your local or state health department.  Contact the Centers for Disease Control and Prevention (CDC):  Call 682-642-0504 (1-800-CDC-INFO)  Visit CDC's website at PicCapture.uy CDC Td Vaccine VIS (04/19/15) This information is not intended to replace advice given to you by your health care provider. Make sure you discuss any questions you have with your health care provider. Document Released: 10/22/2005 Document Revised: 09/15/2015 Document  Reviewed: 09/15/2015 Elsevier Interactive Patient Education  2017 [employer].

## 2016-02-08 ENCOUNTER — Telehealth: Payer: Self-pay

## 2016-02-08 NOTE — Telephone Encounter (Signed)
Contacted pt to go over lab results pt didn't answer was unable to lvm due to mailbox being full 

## 2016-02-14 ENCOUNTER — Ambulatory Visit (HOSPITAL_COMMUNITY)
Admission: RE | Admit: 2016-02-14 | Discharge: 2016-02-14 | Disposition: A | Discharge status: Home / Self Care | Payer: Self-pay | Source: Ambulatory Visit | Attending: Internal Medicine | Admitting: Internal Medicine

## 2016-02-14 DIAGNOSIS — S2220XD Unspecified fracture of sternum, subsequent encounter for fracture with routine healing: Secondary | ICD-10-CM | POA: Insufficient documentation

## 2016-02-14 DIAGNOSIS — J439 Emphysema, unspecified: Secondary | ICD-10-CM | POA: Insufficient documentation

## 2016-02-14 DIAGNOSIS — R918 Other nonspecific abnormal finding of lung field: Secondary | ICD-10-CM | POA: Insufficient documentation

## 2016-02-14 DIAGNOSIS — X58XXXD Exposure to other specified factors, subsequent encounter: Secondary | ICD-10-CM | POA: Insufficient documentation

## 2016-02-14 DIAGNOSIS — I700 Atherosclerosis of aorta: Secondary | ICD-10-CM | POA: Insufficient documentation

## 2016-02-14 DIAGNOSIS — F17200 Nicotine dependence, unspecified, uncomplicated: Secondary | ICD-10-CM | POA: Insufficient documentation

## 2016-02-15 ENCOUNTER — Other Ambulatory Visit: Payer: Self-pay | Admitting: Internal Medicine

## 2016-02-15 DIAGNOSIS — R911 Solitary pulmonary nodule: Secondary | ICD-10-CM

## 2016-02-23 ENCOUNTER — Other Ambulatory Visit: Payer: Self-pay | Admitting: *Deleted

## 2016-02-23 DIAGNOSIS — R918 Other nonspecific abnormal finding of lung field: Secondary | ICD-10-CM

## 2016-02-28 MED FILL — AMLODIPINE BESYLATE 5 MG TA: 5 | 30 days supply | Qty: 30 | Fill #3

## 2016-03-06 ENCOUNTER — Telehealth: Payer: Self-pay | Admitting: Acute Care

## 2016-03-06 NOTE — Telephone Encounter (Signed)
Spoke with pt and his bother and advised that per DR Julien NordmannLangeland he will be [facility] and f/u low dose CT 02/2017.  Will defer until that time.  Pt verbalized understanding.  Nothing further needed.

## 2016-03-20 ENCOUNTER — Encounter: Payer: Self-pay | Admitting: Thoracic Surgery (Cardiothoracic Vascular Surgery)

## 2016-03-20 ENCOUNTER — Other Ambulatory Visit: Payer: Self-pay

## 2016-03-20 ENCOUNTER — Ambulatory Visit (INDEPENDENT_AMBULATORY_CARE_PROVIDER_SITE_OTHER): Payer: No Typology Code available for payment source | Admitting: Thoracic Surgery (Cardiothoracic Vascular Surgery)

## 2016-03-20 VITALS — BP 140/87 | HR 80 | Resp 20 | Ht 69.0 in | Wt 172.0 lb

## 2016-03-20 DIAGNOSIS — R918 Other nonspecific abnormal finding of lung field: Secondary | ICD-10-CM

## 2016-03-20 DIAGNOSIS — J869 Pyothorax without fistula: Secondary | ICD-10-CM

## 2016-03-20 NOTE — Progress Notes (Signed)
[address]       [place] [address]             [phone]    HPI: Mr. Schreck returns for a scheduled follow-up visit regarding multiple small lung nodules.  Mr. Christell Constant to 56 year old man with a history of tobacco and ethanol abuse who had a decortication for right empyema in May 2017. On CT scan there were 2 areas of consolidation versus nodules at the left lung base. I did a repeat CT in September 2017 to reevaluate those areas. Those areas of consolidation had resolved but he had multiple tiny lung nodules bilaterally.  He quit smoking briefly after he was hospitalized with his empyema but started back soon after. He continues to smoke about a pack of cigarettes a day. He says he is interested in quitting but has not tried very hard at this point. He denies any shortness of breath. His primary complaint is left arm numbness intermittently.  Past Medical History:  Diagnosis Date  . Alcoholic (HCC)   . 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 HTN (hypertension)   . Pneumonia 05/2015  . Tobacco abuse      Current Outpatient Prescriptions  Medication Sig Dispense Refill  . acetaminophen (TYLENOL) 500 MG tablet Take 1,000 mg by mouth every 6 (six) hours as needed for moderate pain.    Marland Kitchen albuterol (PROVENTIL HFA;VENTOLIN HFA) 108 (90 Base) MCG/ACT inhaler Inhale 2 puffs into the lungs every 6 (six) hours as needed for wheezing or shortness of breath. 1 Inhaler 2  . albuterol (PROVENTIL) (2.5 MG/3ML) 0.083% nebulizer solution Take 3 mLs (2.5 mg total) by nebulization every 6 (six) hours as needed for wheezing or shortness of breath. 150 mL 1  . amLODipine (NORVASC) 10 MG tablet Take 1 tablet (10 mg total) by mouth daily. 90 tablet 3  . budesonide-formoterol (SYMBICORT) 80-4.5 MCG/ACT inhaler Inhale 2 puffs into the lungs 2 (two) times daily. Rinse mouth out after use 1 Inhaler 3  . buPROPion (WELLBUTRIN SR) 150 MG 12 hr tablet Take 1 tablet (150 mg total) by mouth 2  (two) times daily. 60 tablet 3  . diclofenac sodium (VOLTAREN) 1 % GEL Apply 2 g topically 4 (four) times daily. 100 g 2  . fluticasone furoate-vilanterol (BREO ELLIPTA) 100-25 MCG/INH AEPB Inhale 1 puff into the lungs daily. 3 each 0  . nicotine (NICODERM CQ) 7 mg/24hr patch Place 1 patch (7 mg total) onto the skin daily. 28 patch 2   Current Facility-Administered Medications  Medication Dose Route Frequency Provider Last Rate Last Dose  . carbamide peroxide (DEBROX) 6.5 % otic solution 5 drop  5 drop Both Ears Once Angela M McClung, PA-C        Physical Exam BP 140/87   Pulse 80   Resp 20   Ht 5\' 9"  (1.753 m)   Wt 172 lb (78 kg)   SpO2 99% Comment: RA  BMI 25.14 kg/m  56 year old man in no acute distress Alert and oriented 3 with no focal deficits Good motor strength in both upper extremities and sensation intact No cervical or supraclavicular adenopathy Cardiac regular rate and rhythm normal S1 and S2 Lungs clear with no wheezing  Diagnostic Tests: CT CHEST WITHOUT CONTRAST  TECHNIQUE: Multidetector CT imaging of the chest was performed following the standard protocol without IV contrast.  COMPARISON:  09/20/2015 chest CT.  FINDINGS: Cardiovascular: Normal heart size. No significant pericardial fluid/thickening. Atherosclerotic nonaneurysmal thoracic aorta.  Normal caliber pulmonary arteries.  Mediastinum/Nodes: No discrete thyroid nodules. Unremarkable esophagus. No pathologically enlarged axillary, mediastinal or gross hilar lymph nodes, noting limited sensitivity for the detection of hilar adenopathy on this noncontrast study.  Lungs/Pleura: No pneumothorax. No pleural effusion. Mild centrilobular and paraseptal emphysema with mild diffuse bronchial wall thickening. Stable parenchymal band at the dependent right lung base compatible with postinfectious scarring. There are several (at least 8) scattered solid pulmonary nodules throughout both  lungs measuring up to 5 mm in the peripheral left lower lobe (series 4/image 228), all stable since 09/20/2015. No acute consolidative airspace disease or new significant pulmonary nodules.  Upper abdomen: Unremarkable.  Musculoskeletal: No aggressive appearing focal osseous lesions. Mild thoracic spondylosis. Healing mid sternal fracture with evidence of interval healing.  IMPRESSION: 1. Stable pulmonary nodules measuring up to 5 mm in the left lower lobe, for which 4 month stability has been demonstrated, probably benign. 2. Recommend referral to a lung cancer chest CT screening program in 12 months, for which the patient is qualified based on his age and provided smoking history. 3. Aortic atherosclerosis. 4. Mild emphysema with mild diffuse bronchial wall thickening, suggesting COPD. 5. Healing mid sternal fracture.   Electronically Signed   By: Delbert PhenixJason A Poff M.D.   On: 02/14/2016 15:07 I personally reviewed the CT chest and concur with findings noted above.  Impression: [name] is a 56 year old man with a history of tobacco abuse who had an empyema about a year ago. That was treated with a right VATS and decortication. He had multiple small lung nodules on his initial scan in 2 areas or concerning for possible masses. The areas of concern in the left lower lobe resolved, but he does still have multiple tiny subcentimeter nodules. These have been stable over time.  He would benefit from the lung cancer screening program. No longer be followed on an annual basis with low-dose CT. I see a note from Dr. Julien NordmannLangeland that she is planning to do that.  Tobacco abuse- Smoking cessation instruction/counseling given:  counseled patient on the dangers of tobacco use, advised patient to stop smoking, and reviewed strategies to maximize success. I provided him with the number to the tobacco cessation program which is free.  Plan: Return in 1 year after low-dose CT chest  Loreli SlotSteven C  Missie Gehrig, MD [facility] [phone]

## 2016-04-12 ENCOUNTER — Telehealth: Payer: Self-pay

## 2016-04-12 MED FILL — AMLODIPINE BESYLATE 10 MG T: 10 | 30 days supply | Qty: 30 | Fill #0

## 2016-04-12 NOTE — Telephone Encounter (Signed)
Called to finds out if patient wants colonoscopy scheduled

## 2016-05-24 ENCOUNTER — Encounter: Payer: Self-pay | Admitting: Internal Medicine

## 2016-05-25 ENCOUNTER — Encounter: Payer: Self-pay | Admitting: Internal Medicine

## 2016-05-28 ENCOUNTER — Encounter: Payer: Self-pay | Admitting: Internal Medicine

## 2016-06-06 NOTE — Congregational Nurse Program (Signed)
Congregational Nurse Program Note  Date of Encounter: 06/06/2016  Past Medical History: Past Medical History:  Diagnosis Date  . Alcoholic (HCC)   . 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 HTN (hypertension)   . Pneumonia 05/2015  . Tobacco abuse     Encounter Details:     CNP Questionnaire - 06/06/16 1453      Patient Demographics   Is this a new or existing patient? New   Patient is considered a/an Not Applicable   Race Caucasian/White     Patient Assistance   Location of Patient Assistance Not Applicable   Patient's financial/insurance status Low Income;[employer] Card/[employer] Connects   Uninsured Patient ([employer] Research officer, trade unionCard/Care Connects) Yes   Patient referred to apply for the following financial assistance Not Applicable   Food insecurities addressed Not Applicable   Transportation assistance No   Assistance securing medications No   Educational Programmer, systemshealth offerings Navigating the healthcare system     Encounter Details   Primary purpose of visit Other;Navigating the Healthcare System   Was an Emergency Department visit averted? Not Applicable   Does patient have a medical provider? Yes   Patient referred to Not Applicable   Was a mental health screening completed? (GAINS tool) No   Does patient have dental issues? No   Does patient have vision issues? No   Does your patient have an abnormal blood pressure today? No   Since previous encounter, have you referred patient for abnormal blood pressure that resulted in a new diagnosis or medication change? No   Does your patient have an abnormal blood glucose today? No   Since previous encounter, have you referred patient for abnormal blood glucose that resulted in a new diagnosis or medication change? No   Was there a life-saving intervention made? No     Requested assistance with housing.  Client has no income and is not working.  States he would like to apply for disability.  Provided instructions about how to sign up for case  management assisting with disability claims at the [facility]

## 2016-06-16 ENCOUNTER — Emergency Department (HOSPITAL_COMMUNITY): Payer: No Typology Code available for payment source

## 2016-06-16 ENCOUNTER — Encounter (HOSPITAL_COMMUNITY): Payer: Self-pay | Admitting: Emergency Medicine

## 2016-06-16 ENCOUNTER — Emergency Department (HOSPITAL_COMMUNITY)
Admission: EM | Admit: 2016-06-16 | Discharge: 2016-06-16 | Discharge status: Against Medical Advice | Payer: No Typology Code available for payment source | Attending: Emergency Medicine | Admitting: Emergency Medicine

## 2016-06-16 DIAGNOSIS — Z5321 Procedure and treatment not carried out due to patient leaving prior to being seen by health care provider: Secondary | ICD-10-CM | POA: Insufficient documentation

## 2016-06-16 DIAGNOSIS — J449 Chronic obstructive pulmonary disease, unspecified: Secondary | ICD-10-CM | POA: Insufficient documentation

## 2016-06-16 DIAGNOSIS — R42 Dizziness and giddiness: Secondary | ICD-10-CM | POA: Insufficient documentation

## 2016-06-16 HISTORY — DX: Chronic obstructive pulmonary disease, unspecified: J44.9

## 2016-06-16 LAB — CBC WITH DIFFERENTIAL/PLATELET
BASOS ABS: 0.1 10*3/uL (ref 0.0–0.1)
Basophils Relative: 2 %
EOS PCT: 6 %
Eosinophils Absolute: 0.3 10*3/uL (ref 0.0–0.7)
HCT: 42.1 % (ref 39.0–52.0)
Hemoglobin: 14 g/dL (ref 13.0–17.0)
LYMPHS ABS: 2.3 10*3/uL (ref 0.7–4.0)
LYMPHS PCT: 47 %
MCH: 34 pg (ref 26.0–34.0)
MCHC: 33.3 g/dL (ref 30.0–36.0)
MCV: 102.2 fL — AB (ref 78.0–100.0)
Monocytes Absolute: 0.3 10*3/uL (ref 0.1–1.0)
Monocytes Relative: 7 %
Neutro Abs: 1.9 10*3/uL (ref 1.7–7.7)
Neutrophils Relative %: 38 %
PLATELETS: 185 10*3/uL (ref 150–400)
RBC: 4.12 MIL/uL — AB (ref 4.22–5.81)
RDW: 12.9 % (ref 11.5–15.5)
WBC: 4.9 10*3/uL (ref 4.0–10.5)

## 2016-06-16 LAB — COMPREHENSIVE METABOLIC PANEL
ALK PHOS: 64 U/L (ref 38–126)
ALT: 46 U/L (ref 17–63)
AST: 102 U/L — AB (ref 15–41)
Albumin: 3.9 g/dL (ref 3.5–5.0)
Anion gap: 11 (ref 5–15)
BILIRUBIN TOTAL: 0.3 mg/dL (ref 0.3–1.2)
CO2: 21 mmol/L — ABNORMAL LOW (ref 22–32)
CREATININE: 0.69 mg/dL (ref 0.61–1.24)
Calcium: 9 mg/dL (ref 8.9–10.3)
Chloride: 105 mmol/L (ref 101–111)
GFR calc Af Amer: >60 mL/min (ref >60)
GLUCOSE: 105 mg/dL — AB (ref 65–99)
POTASSIUM: 3.9 mmol/L (ref 3.5–5.1)
Sodium: 137 mmol/L (ref 135–145)
TOTAL PROTEIN: 7.7 g/dL (ref 6.5–8.1)

## 2016-06-16 NOTE — ED Notes (Signed)
Pt called for vitals but no answer in the waiting. Pt called for a room and no answer. Xray call also pt not there. Pt name returned to waiting room.

## 2016-06-16 NOTE — ED Triage Notes (Signed)
Pt c/o dizziness for "awhile" longer than 2 weeks" -- admits to drinking etoh everyday -- a couple 40 oz beers--

## 2016-06-16 NOTE — ED Notes (Signed)
No answer when called 

## 2016-06-16 NOTE — ED Notes (Signed)
Called for room once again with no answer received.

## 2016-07-16 MED FILL — ?AMLODIPINE BESYLATE 10 MG: 10 | 30 days supply | Qty: 30 | Fill #1

## 2016-08-24 ENCOUNTER — Telehealth: Payer: Self-pay | Admitting: Internal Medicine

## 2016-08-24 DIAGNOSIS — I10 Essential (primary) hypertension: Secondary | ICD-10-CM

## 2016-08-24 MED ORDER — AMLODIPINE BESYLATE 10 MG PO TABS: 10.0000 mg | ORAL_TABLET | Freq: Every day | ORAL | 0 refills | Status: DC

## 2016-08-24 NOTE — Telephone Encounter (Signed)
Refilled x 30 days, patient must have office visit for any further refills

## 2016-08-24 NOTE — Telephone Encounter (Signed)
Patient called requesting medication refill on amLODipine (NORVASC) 10 MG tablet Please f/up

## 2016-09-06 ENCOUNTER — Ambulatory Visit: Payer: Self-pay | Attending: Internal Medicine

## 2016-09-28 ENCOUNTER — Ambulatory Visit: Payer: Self-pay | Admitting: Internal Medicine

## 2016-10-19 IMAGING — CT CT CHEST W/O CM
2 of 4 series · 14 of 36 positions shown, 17 images · non-contrast
Comparison: Chest radiographs 06/21/2015, 06/08/2015, CT scan chest
06/01/2015

CLINICAL DATA: Pneumonia follow-up, patient reports right lung was
pumped 2-3 months ago. Lung nodules history of hypertension former
smoker

EXAM:
CT CHEST WITHOUT CONTRAST
TECHNIQUE: Multidetector CT imaging of the chest was performed following the
standard protocol without IV contrast.

[Series 3: chest w/o · axial · non-contrast · 0.70mm/px · z∈[-300,-13]mm · 12 of 137 slices shown, 15 images]
[im 11/137  mediastinal]
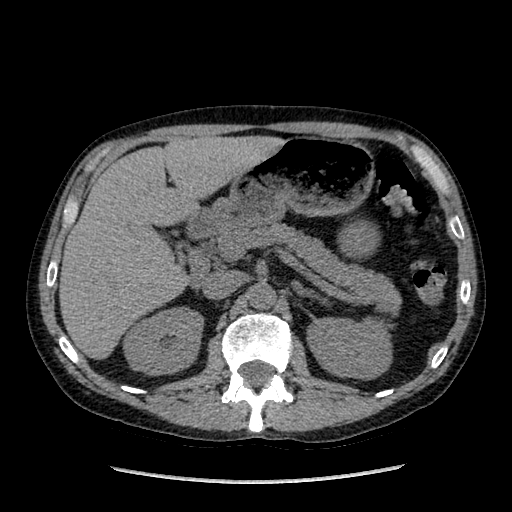
[im 11/137  lung]
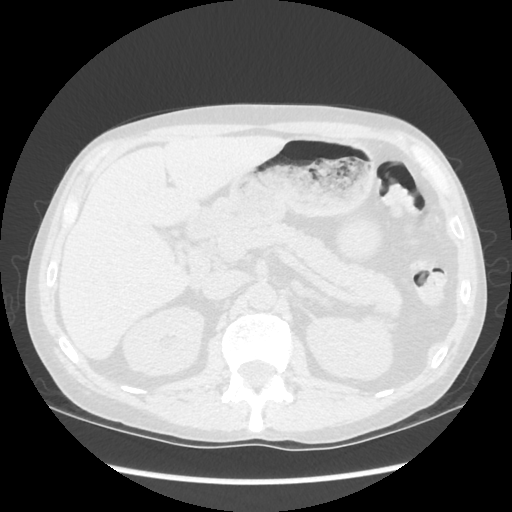
[im 21/137  lung]
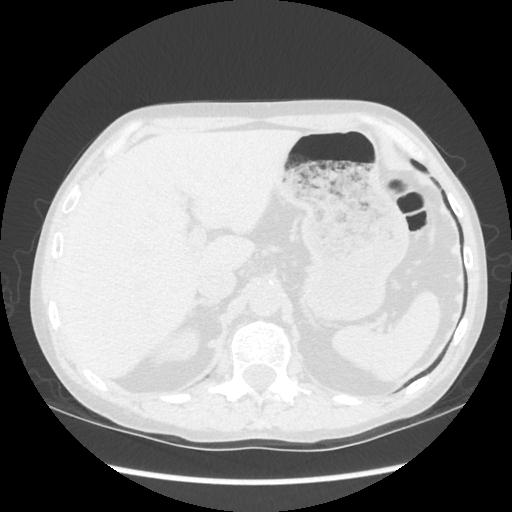
[im 32/137  lung]
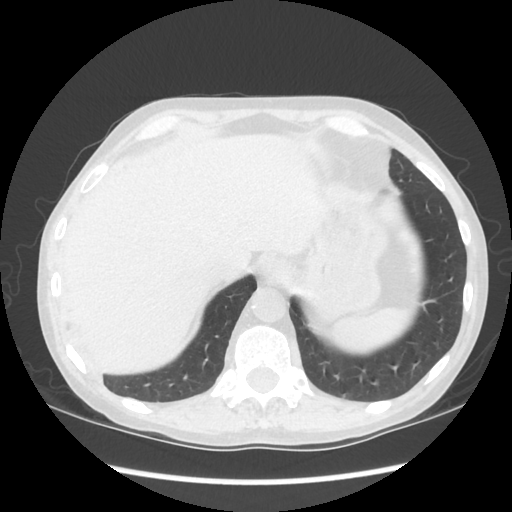
[im 42/137  lung]
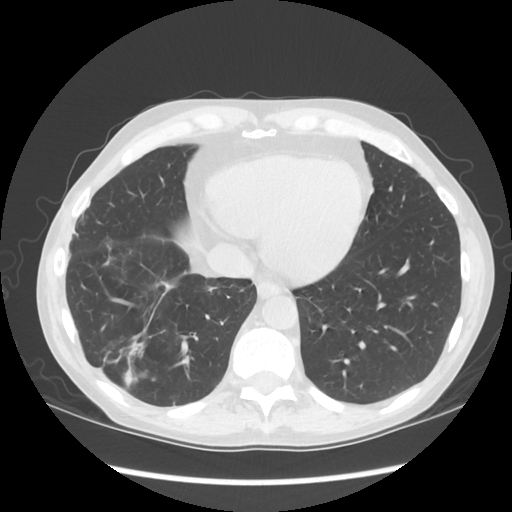
[im 53/137  mediastinal]
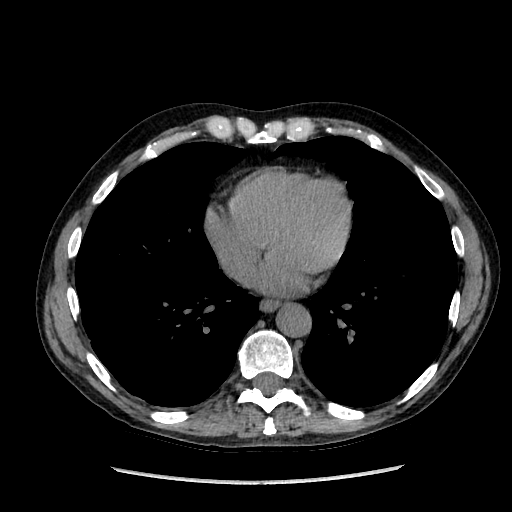
[im 53/137  lung]
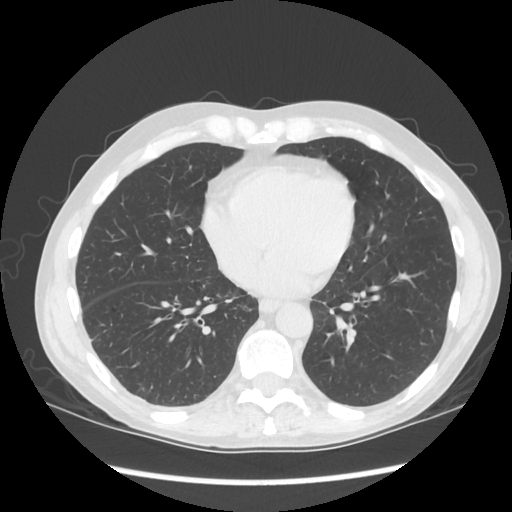
[im 63/137  lung]
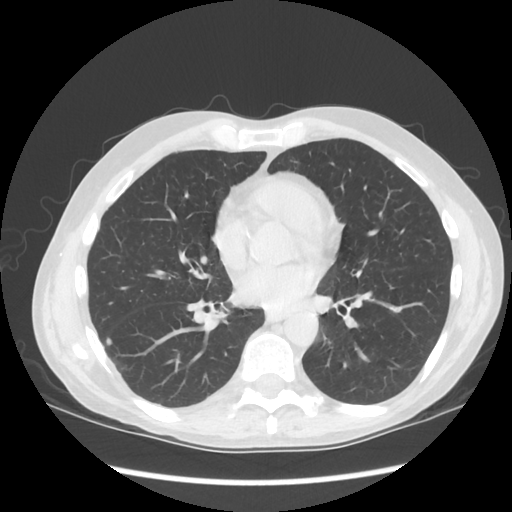
[im 74/137  lung]
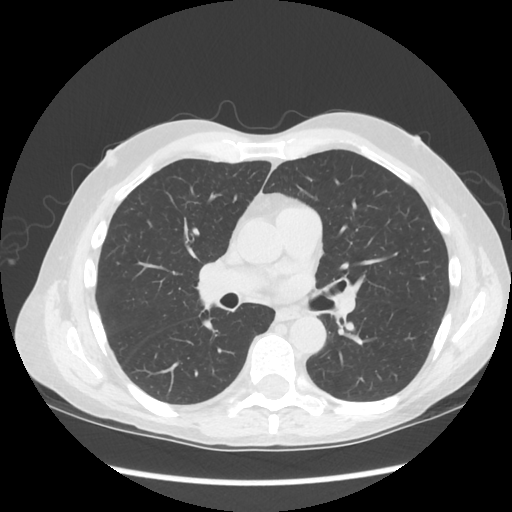
[im 84/137  lung]
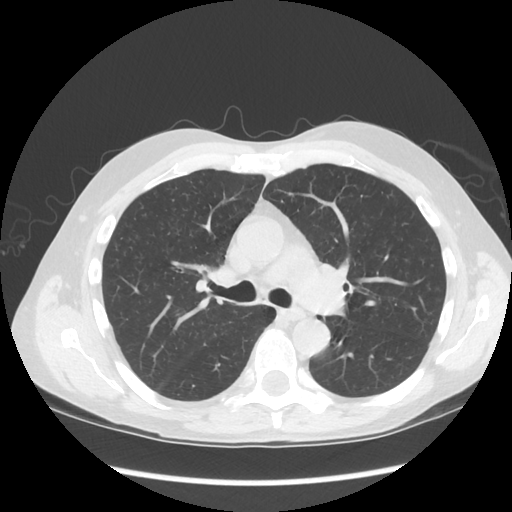
[im 95/137  mediastinal]
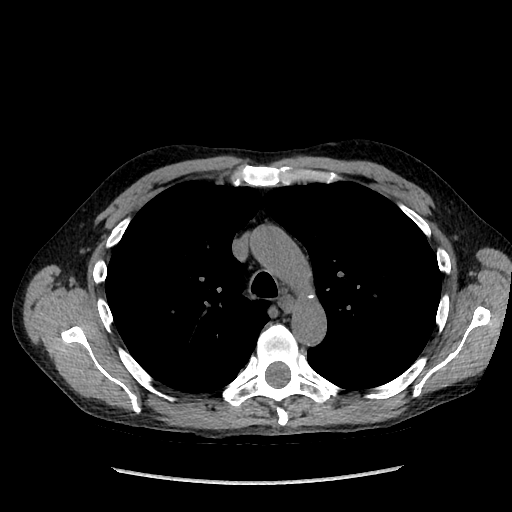
[im 95/137  lung]
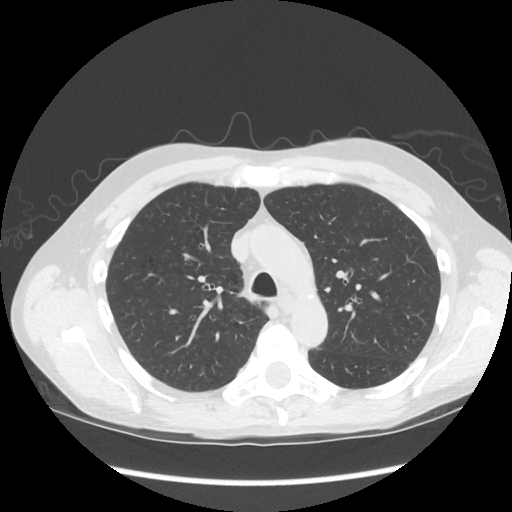
[im 105/137  lung]
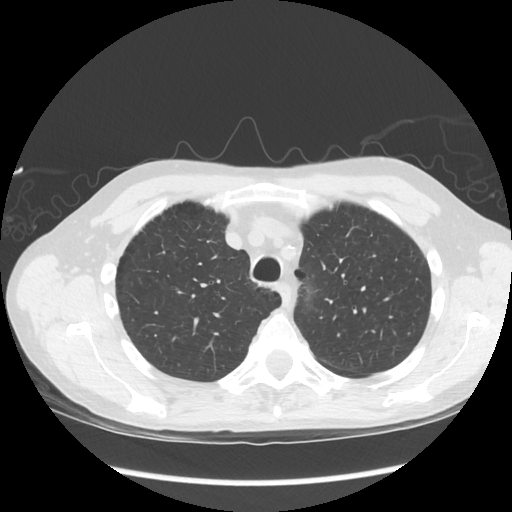
[im 116/137  lung]
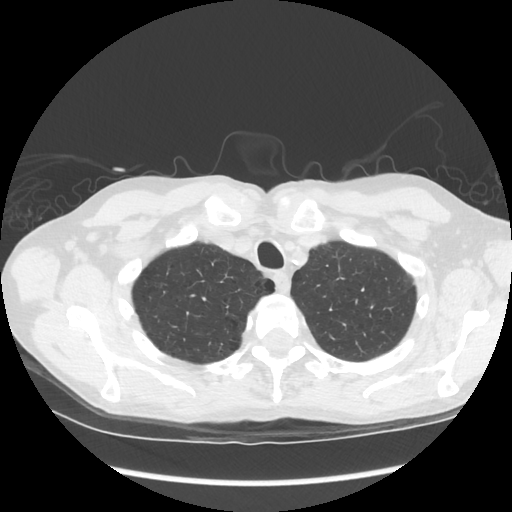
[im 126/137  lung]
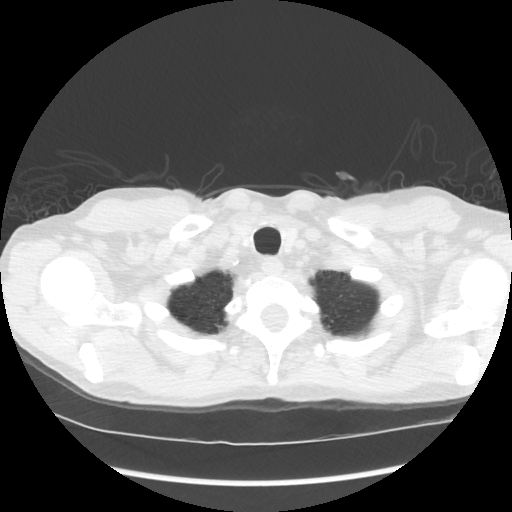

[Series 202: sagittals · sagittal · 0.70mm/px · 2 of 164 slices shown]
[im 55/164  lung]
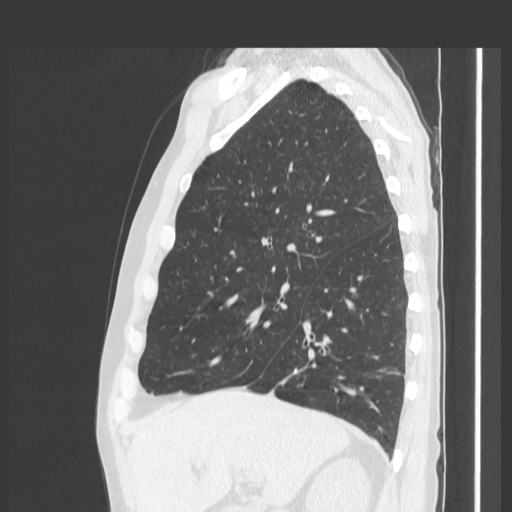
[im 109/164  lung]
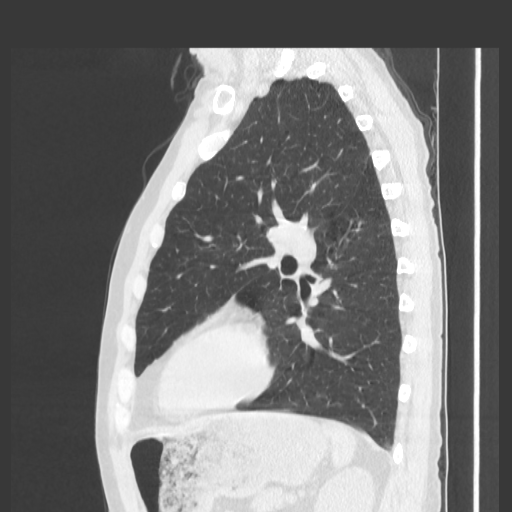

[14 of 36 positions shown; findings below may reference images not displayed]

FINDINGS: Cardiovascular: Limited without the presence of intravenous
contrast. Atherosclerotic vascular calcifications are present within
the aorta. No aneurysmal dilatation. Heart size is nonenlarged. No
significant pericardial effusion.

Mediastinum/Nodes: Image thyroid glands unremarkable. Interval
decrease in size of previously noted prominent mediastinal lymph
nodes. No significantly enlarged mediastinal lymph nodes present on
current exam. No significant axillary adenopathy. A tiny anterior
cardiophrenic node is unchanged. Trachea and mainstem bronchi a
within normal limits.

Lungs/Pleura: Mild paraseptal and centrilobular emphysema with upper
lobe predominance again noted. Previously noted confluent nodular
airspace disease within the posterior left lower lobe have resolved.
A large gas and fluid containing collection/empyema within the right
chest has also resolved. Mild irregular density is seen in the right
posterior peripheral lower lobe and is favored to represent scarring
given stable radiographic appearance. A few tiny nodular densities
are seen at the periphery of the presumed scarring in the right
lower lobe. Vague foci of ground-glass density in the right upper
lobe have slightly decreased compared to prior. Small nodular right
middle lobe densities are present, likely also representing
scarring.

There are multiple small pulmonary nodules, the most notable of
which will be described as follows:

Right peripheral lower lobe nodule measuring 6 mm in diameter
adjacent to the fissure, series 4, image number 75.

Right middle lobe anterior subpleural nodule measuring 5 mm, series
4, image number 93.

Left upper lobe 2 mm nodule abutting the fissure, series 4, image
number 62.

Left lower lobe posterior subpleural nodule measuring 5 mm, series
4, image number 92. This is grossly unchanged compared to comparison
study.

Left lower lobe posterior medial subpleural nodule measuring 3 mm,
series 4, image number 105.

Upper Abdomen: Limited images through the upper abdomen demonstrate
grossly negative noncontrast appearance of the imaged portions of
the liver, spleen, pancreas, gallbladder, adrenal glands and upper
poles of the kidneys.

Musculoskeletal: No acute osseous abnormality. Bony irregularity of
the mid sternum with increasing peripheral ossifications suggests a
healing fracture.a
IMPRESSION: 1. Interim resolution of previously noted right chest empyema.
Residual irregular density at the right lower lobe favored to
represent scarring. A few small surrounding nodular densities are
noted.
2. Interim resolution of previously noted masslike consolidations in
the left lower lobe.
3. Multiple bilateral sub cm pulmonary nodules. Non-contrast chest
CT at 3-6 months is recommended. If the nodules are stable at time
of repeat CT, then future CT at 18-24 months (from today's scan) is
considered optional for low-risk patients, but is recommended for
high-risk patients. This recommendation follows the consensus
statement: Guidelines for Management of Incidental Pulmonary Nodules
Detected on CT Images:From the [HOSPITAL] 2260; published
online before print (10.1148/radiol.3802010192).
4. Bony irregularity of the mid sternum with increasing surrounding
callus suggests a healing fracture.
5. Bilateral emphysematous disease.
6. Atherosclerotic vascular disease

## 2016-11-06 ENCOUNTER — Encounter: Payer: Self-pay | Admitting: Internal Medicine

## 2016-11-06 ENCOUNTER — Ambulatory Visit: Payer: Self-pay | Attending: Internal Medicine | Admitting: Internal Medicine

## 2016-11-06 VITALS — BP 164/104 | HR 104 | Temp 98.6°F | Resp 16 | Wt 133.4 lb

## 2016-11-06 DIAGNOSIS — R918 Other nonspecific abnormal finding of lung field: Secondary | ICD-10-CM | POA: Insufficient documentation

## 2016-11-06 DIAGNOSIS — Z720 Tobacco use: Secondary | ICD-10-CM | POA: Insufficient documentation

## 2016-11-06 DIAGNOSIS — J439 Emphysema, unspecified: Secondary | ICD-10-CM

## 2016-11-06 DIAGNOSIS — N3941 Urge incontinence: Secondary | ICD-10-CM

## 2016-11-06 DIAGNOSIS — Z23 Encounter for immunization: Secondary | ICD-10-CM

## 2016-11-06 DIAGNOSIS — I10 Essential (primary) hypertension: Secondary | ICD-10-CM

## 2016-11-06 DIAGNOSIS — Z79899 Other long term (current) drug therapy: Secondary | ICD-10-CM | POA: Insufficient documentation

## 2016-11-06 DIAGNOSIS — F1010 Alcohol abuse, uncomplicated: Secondary | ICD-10-CM

## 2016-11-06 DIAGNOSIS — Z8249 Family history of ischemic heart disease and other diseases of the circulatory system: Secondary | ICD-10-CM | POA: Insufficient documentation

## 2016-11-06 DIAGNOSIS — Z7951 Long term (current) use of inhaled steroids: Secondary | ICD-10-CM | POA: Insufficient documentation

## 2016-11-06 DIAGNOSIS — F17210 Nicotine dependence, cigarettes, uncomplicated: Secondary | ICD-10-CM | POA: Insufficient documentation

## 2016-11-06 DIAGNOSIS — F10239 Alcohol dependence with withdrawal, unspecified: Secondary | ICD-10-CM | POA: Insufficient documentation

## 2016-11-06 DIAGNOSIS — Z1211 Encounter for screening for malignant neoplasm of colon: Secondary | ICD-10-CM

## 2016-11-06 DIAGNOSIS — Z8379 Family history of other diseases of the digestive system: Secondary | ICD-10-CM | POA: Insufficient documentation

## 2016-11-06 DIAGNOSIS — Z590 Homelessness: Secondary | ICD-10-CM | POA: Insufficient documentation

## 2016-11-06 HISTORY — DX: Emphysema, unspecified: J43.9

## 2016-11-06 HISTORY — DX: Alcohol abuse, uncomplicated: F10.10

## 2016-11-06 HISTORY — DX: Urge incontinence: N39.41

## 2016-11-06 HISTORY — DX: Encounter for immunization: Z23

## 2016-11-06 MED ORDER — MULTIVITAMINS PO CAPS: 1.0000 | ORAL_CAPSULE | Freq: Every day | ORAL | 1 refills | Status: DC

## 2016-11-06 MED ORDER — OXYBUTYNIN CHLORIDE ER 5 MG PO TB24: 5.0000 mg | ORAL_TABLET | Freq: Every day | ORAL | 3 refills | Status: DC

## 2016-11-06 MED ORDER — AMLODIPINE BESYLATE 10 MG PO TABS: 10.0000 mg | ORAL_TABLET | Freq: Every day | ORAL | 0 refills | Status: DC

## 2016-11-06 MED ORDER — VITAMIN B-1 100 MG PO TABS: 100.0000 mg | ORAL_TABLET | Freq: Every day | ORAL | 1 refills | Status: DC

## 2016-11-06 MED ORDER — SOLIFENACIN SUCCINATE 5 MG PO TABS: 5.0000 mg | ORAL_TABLET | Freq: Every day | ORAL | 6 refills | Status: DC

## 2016-11-06 NOTE — Progress Notes (Signed)
Patient ID: Brent Warren, male    DOB: 09/08/1960  MRN: [id number]  CC: re-establish and Hypertension   Subjective: Brent Warren is a 56 y.o. male who presents for chronic disease management His concerns today include:  Patient with history of htn, copd, tob and etoh abuse, hx of  Right empyema sp VATS 5/17, with findings of multiple lung nodules on CT scan 9/17.  Issues addressed today: 1. Incontinence of urine:  When he has the urge, he has to go right away -6-8 months in duration -no dysuria, hematuria, enurisis, nocturia. Urine flows freely  -drinks 2-3 40 oz beers a day  2. HTN: Was prescribed Norvasc 10 mg but taking just a half a tablet a day instead of the full tablet Denies any ongoing chest pains, shortness of breath or lower extremity edema  3. COPD and nodules on CT scan: "Every now and again I get to breathing heavy." -Denies chronic cough or wheezing. Suppose to be on Breo and albuterol but he uses both occasionally -smoking a pack/day since age 10 yrs. Used patches in past Given Wellbutrin on last visit but did not take because he was not sure whether he can take it since he continues to drink alcohol - 4. Wgh in 03/2016 recorded as 172 lbs suggesting he has had weight loss. However looking at his chart most of his weight has been 130s to 140 -He denies any significant weight loss this past year and thinks that the weight of 172 was recorded incorrectly -Denies any blood in the stools. Referred for colonoscopy in the past. He now has [employer] card/cone discount and is willing to be referred again  5. ETOH:  -Drinking daily as stated above. Would like to quit went through Rehab and quit for 8 hrs. Restart in 1997 -He was married at the time and his spouse also was a heavy drinker and used drugs   -use to go to [employer] -He is essentially homeless. Currently staying with his brother. Hopes to move to [facility] in the near future to stay [place]  HM: due for flu shot and colonoscopy  Patient Active Problem List   Diagnosis Date Noted  . Immunization due 11/06/2016  . Pulmonary emphysema (HCC) 11/06/2016  . Lung nodule, multiple 11/08/2015  . HTN (hypertension) 08/01/2015  . Empyema lung (HCC)   . Hypokalemia 06/02/2015  . Malnutrition of moderate degree 06/02/2015  . Hydropneumothorax 06/01/2015  . Alcohol use disorder, moderate, dependence (HCC) 05/22/2015  . Alcohol withdrawal (HCC) 05/22/2015  . CAP (community acquired pneumonia) 05/22/2015  . Pleural effusion 05/22/2015  . Hyponatremia 05/22/2015  . Elevated serum creatinine 05/22/2015  . Hepatitis 05/22/2015  . Sepsis (HCC) 05/22/2015     Current Outpatient Prescriptions on File Prior to Visit  Medication Sig Dispense Refill  . albuterol (PROVENTIL HFA;VENTOLIN HFA) 108 (90 Base) MCG/ACT inhaler Inhale 2 puffs into the lungs every 6 (six) hours as needed for wheezing or shortness of breath. 1 Inhaler 2  . albuterol (PROVENTIL) (2.5 MG/3ML) 0.083% nebulizer solution Take 3 mLs (2.5 mg total) by nebulization every 6 (six) hours as needed for wheezing or shortness of breath. 150 mL 1  . diclofenac sodium (VOLTAREN) 1 % GEL Apply 2 g topically 4 (four) times daily. 100 g 2  . fluticasone furoate-vilanterol (BREO ELLIPTA) 100-25 MCG/INH AEPB Inhale 1 puff into the lungs daily. 3 each 0   No current facility-administered medications on file prior to visit.  No Known Allergies  Social History   Social History  . Marital status: Single    Spouse name: N/A  . Number of children: N/A  . Years of education: N/A   Occupational History  . Not on file.   Social History Main Topics  . Smoking status: Current Every Day Smoker    Packs/day: 1.00    Types: Cigarettes    Start date: 07/08/2015  . Smokeless tobacco: Never Used     Comment: a pack in a couple of days  . Alcohol use 1.8 - 2.4 oz/week    3 - 4 Cans of beer per week     Comment: daily  . Drug  use: No  . Sexual activity: Not on file   Other Topics Concern  . Not on file   Social History Narrative  . No narrative on file    Family History  Problem Relation Age of Onset  . Heart attack Mother   . Cirrhosis Father     Past Surgical History:  Procedure Laterality Date  . arm surgery    . DECORTICATION Right 06/03/2015   Procedure: DECORTICATION;  Surgeon: [name], MD;  [facility] OR;  Service: Thoracic;  Laterality: Right;  Marland Kitchen VIDEO ASSISTED THORACOSCOPY (VATS)/EMPYEMA Right 06/03/2015   Procedure: VIDEO ASSISTED THORACOSCOPY (VATS)/EMPYEMA;  Surgeon: [name], MD;  Location: [facility] OR;  Service: Thoracic;  Laterality: Right;    ROS: Review of Systems Negative except as stated above.  PHYSICAL EXAM: BP (!) 164/104   Pulse (!) 104   Temp 98.6 F (37 C) (Oral)   Resp 16   Wt 133 lb 6.4 oz (60.5 kg)   SpO2 95%   BMI 19.70 kg/m   Wt Readings from Last 3 Encounters:  11/06/16 133 lb 6.4 oz (60.5 kg)  06/16/16 130 lb (59 kg)  03/20/16 172 lb (78 kg)   Physical Exam   General appearance - middle-age Caucasian male in NAD. He is on Unkept Mental status -alert and oriented. Answers questions appropriately Nose - mild enlargement of both nasal turbinates Mouth - mucous membranes moist, pharynx normal without lesions Neck - supple, no significant adenopathy Chest - clear to auscultation, no wheezes, rales or rhonchi, symmetric air entry Heart -mild tachycardia but regular rate and rhythm  Abdomen - soft, nontender, nondistended, no masses or organomegaly Extremities - no lower extremity edema Neurologic - positive asterixis   Chemistry      Component Value Date/Time   NA 137 06/16/2016 1715   K 3.9 06/16/2016 1715   CL 105 06/16/2016 1715   CO2 21 (L) 06/16/2016 1715   BUN <5 (L) 06/16/2016 1715   CREATININE 0.69 06/16/2016 1715   CREATININE 0.86 02/07/2016 1014      Component Value Date/Time   CALCIUM 9.0 06/16/2016 1715   ALKPHOS  64 06/16/2016 1715   AST 102 (H) 06/16/2016 1715   ALT 46 06/16/2016 1715   BILITOT 0.3 06/16/2016 1715     Lab Results  Component Value Date   WBC 4.9 06/16/2016   HGB 14.0 06/16/2016   HCT 42.1 06/16/2016   MCV 102.2 (H) 06/16/2016   PLT 185 06/16/2016     ASSESSMENT AND PLAN: 1. Urge incontinence -Discussed diagnoses with patient. Encourage him to avoid bladder irritants including caffeine and excessive alcohol - oxybutynin (DITROPAN-XL) 5 MG 24 hr tablet; Take 1 tablet (5 mg total) by mouth at bedtime.  Dispense: 30 tablet; Refill: 3  2. Essential hypertension Not at  goal. Advise to take the full 10 mg of Norvasc as prescribed - amLODipine (NORVASC) 10 MG tablet; Take 1 tablet (10 mg total) by mouth daily.  Dispense: 30 tablet; Refill: 0  3. Tobacco abuse Patient advised to quit smoking. Discussed health risks associated with smoking including lung and other types of cancers, chronic lung diseases and CV risks.. Pt not ready to give trail of quitting.   He will need repeat CAT scan of the chest in 02/2017 for follow-up on lung nodules  4. ETOH abuse -Patient with shaking due to alcohol withdrawal. We discussed his desire to quit. I recommend going through acute detox by presenting to the emergency room who can then have him transferred to appropriate detox facility in the area. They can then get him plugged in with an outpatient rehabilitation program - Multiple Vitamin (MULTIVITAMIN) capsule; Take 1 capsule by mouth daily.  Dispense: 100 capsule; Refill: 1 - thiamine (VITAMIN B-1) 100 MG tablet; Take 1 tablet (100 mg total) by mouth daily.  Dispense: 100 tablet; Refill: 1  5. Pulmonary emphysema, unspecified emphysema type (HCC) -He is not very symptomatic to require daily medication. Advised that he keep the albuterol with him to use as needed  6. Immunization due - Flu Vaccine QUAD 6+ mos PF IM (Fluarix Quad PF)  7. Colon cancer screening - Ambulatory referral to  Gastroenterology  Patient was given the opportunity to ask questions.  Patient verbalized understanding of the plan and was able to repeat key elements of the plan.   Orders Placed This Encounter  Procedures  . Flu Vaccine QUAD 6+ mos PF IM (Fluarix Quad PF)  . Ambulatory referral to Gastroenterology     Requested Prescriptions   Signed Prescriptions Disp Refills  . amLODipine (NORVASC) 10 MG tablet 30 tablet 0    Sig: Take 1 tablet (10 mg total) by mouth daily.  . Multiple Vitamin (MULTIVITAMIN) capsule 100 capsule 1    Sig: Take 1 capsule by mouth daily.  Marland Kitchen. thiamine (VITAMIN B-1) 100 MG tablet 100 tablet 1    Sig: Take 1 tablet (100 mg total) by mouth daily.  Marland Kitchen. oxybutynin (DITROPAN-XL) 5 MG 24 hr tablet 30 tablet 3    Sig: Take 1 tablet (5 mg total) by mouth at bedtime.    Return in about 3 months (around 02/06/2017).  Jonah Blueeborah Kiven Vangilder, MD, FACP

## 2016-11-06 NOTE — Patient Instructions (Signed)

## 2016-11-09 MED FILL — AMLODIPINE BESYLATE 10 MG T: 10 | 30 days supply | Qty: 30 | Fill #0

## 2016-11-09 MED FILL — ?OXYBUTYNIN CL ER 5MG TABLE: 5 | 30 days supply | Qty: 30 | Fill #0

## 2017-01-07 ENCOUNTER — Encounter: Payer: Self-pay | Admitting: Internal Medicine

## 2017-01-14 MED FILL — AMLODIPINE BESYLATE 10 MG T: 10 | 30 days supply | Qty: 30 | Fill #2

## 2017-02-07 ENCOUNTER — Ambulatory Visit: Payer: Self-pay | Admitting: Internal Medicine

## 2017-02-21 ENCOUNTER — Other Ambulatory Visit: Payer: Self-pay | Admitting: Acute Care

## 2017-02-21 DIAGNOSIS — Z122 Encounter for screening for malignant neoplasm of respiratory organs: Secondary | ICD-10-CM

## 2017-02-21 DIAGNOSIS — F17210 Nicotine dependence, cigarettes, uncomplicated: Secondary | ICD-10-CM

## 2017-02-28 ENCOUNTER — Telehealth: Payer: Self-pay | Admitting: Internal Medicine

## 2017-02-28 DIAGNOSIS — I10 Essential (primary) hypertension: Secondary | ICD-10-CM

## 2017-02-28 MED ORDER — AMLODIPINE BESYLATE 10 MG PO TABS: 10.0000 mg | ORAL_TABLET | Freq: Every day | ORAL | 0 refills | Status: DC

## 2017-02-28 NOTE — Telephone Encounter (Signed)
Refilled x 30 days, patient needs office visit for further refills.

## 2017-02-28 NOTE — Telephone Encounter (Signed)
Patient called and requested for a refill on listed medication.  amLODipine (NORVASC) 10 MG tablet [[id number]][[id number]]  [facility].

## 2017-03-14 ENCOUNTER — Other Ambulatory Visit: Payer: Self-pay

## 2017-03-14 ENCOUNTER — Ambulatory Visit: Payer: Self-pay | Attending: Internal Medicine | Admitting: Internal Medicine

## 2017-03-14 ENCOUNTER — Encounter: Payer: Self-pay | Admitting: Internal Medicine

## 2017-03-14 VITALS — BP 123/91 | HR 90 | Temp 98.0°F | Resp 16 | Ht 69.0 in | Wt 141.0 lb

## 2017-03-14 DIAGNOSIS — Z8249 Family history of ischemic heart disease and other diseases of the circulatory system: Secondary | ICD-10-CM | POA: Insufficient documentation

## 2017-03-14 DIAGNOSIS — Z8379 Family history of other diseases of the digestive system: Secondary | ICD-10-CM | POA: Insufficient documentation

## 2017-03-14 DIAGNOSIS — Z79899 Other long term (current) drug therapy: Secondary | ICD-10-CM | POA: Insufficient documentation

## 2017-03-14 DIAGNOSIS — Z9889 Other specified postprocedural states: Secondary | ICD-10-CM | POA: Insufficient documentation

## 2017-03-14 DIAGNOSIS — I10 Essential (primary) hypertension: Secondary | ICD-10-CM | POA: Insufficient documentation

## 2017-03-14 DIAGNOSIS — R918 Other nonspecific abnormal finding of lung field: Secondary | ICD-10-CM

## 2017-03-14 DIAGNOSIS — Z720 Tobacco use: Secondary | ICD-10-CM

## 2017-03-14 DIAGNOSIS — R0789 Other chest pain: Secondary | ICD-10-CM | POA: Insufficient documentation

## 2017-03-14 DIAGNOSIS — F17210 Nicotine dependence, cigarettes, uncomplicated: Secondary | ICD-10-CM | POA: Insufficient documentation

## 2017-03-14 DIAGNOSIS — R079 Chest pain, unspecified: Secondary | ICD-10-CM

## 2017-03-14 DIAGNOSIS — Z1211 Encounter for screening for malignant neoplasm of colon: Secondary | ICD-10-CM | POA: Insufficient documentation

## 2017-03-14 DIAGNOSIS — R200 Anesthesia of skin: Secondary | ICD-10-CM | POA: Insufficient documentation

## 2017-03-14 DIAGNOSIS — F1010 Alcohol abuse, uncomplicated: Secondary | ICD-10-CM | POA: Insufficient documentation

## 2017-03-14 DIAGNOSIS — J449 Chronic obstructive pulmonary disease, unspecified: Secondary | ICD-10-CM | POA: Insufficient documentation

## 2017-03-14 NOTE — Progress Notes (Signed)
Patient ID: Brent Warren, male    DOB: 07/12/1960  MRN: [id number]  CC: Hypertension   Subjective: Brent Warren is a 57 y.o. male who presents for chronic disease management.  Last seen October 2018. His concerns today include:  Patient with history of htn, copd, tob and etoh abuse, hx of Right empyema sp VATS 5/17, with findings of multiple lung nodules on CT scan 9/17.  1.  HTN:  Compliant with Norvasc but did not take as yet for today -He limits salt in the foods.  No lower extremity edema.    2.  Tob/lung nodules: has screening CT scan tomorrow. He continues to smoke.  Currently at 1 pack/day.  Wants to quit and would like to try the patches but not able to afford them. -Still drinking several beers a day.  He never followed through on going to detox  3.  Numbness in LT arm and leg intermittently for mths -endorses LT sided CP intermittently times several weeks; can occur with rest or with exertion.- "not a pain pain but I can feel it."  Associated with numbness in arm No fhx of CAD Smoking 1 pk /day.    4.  Colon cancer screening: Was called by gastroenterology for appointment but they were not able to get in contact with him. Patient Active Problem List   Diagnosis Date Noted  . Immunization due 11/06/2016  . Pulmonary emphysema (HCC) 11/06/2016  . Urge incontinence 11/06/2016  . Tobacco abuse 11/06/2016  . ETOH abuse 11/06/2016  . Lung nodule, multiple 11/08/2015  . HTN (hypertension) 08/01/2015  . Malnutrition of moderate degree 06/02/2015  . Hepatitis 05/22/2015     Current Outpatient Medications on File Prior to Visit  Medication Sig Dispense Refill  . amLODipine (NORVASC) 10 MG tablet Take 1 tablet (10 mg total) by mouth daily. 30 tablet 0  . albuterol (PROVENTIL HFA;VENTOLIN HFA) 108 (90 Base) MCG/ACT inhaler Inhale 2 puffs into the lungs every 6 (six) hours as needed for wheezing or shortness of breath. (Patient not taking: Reported on 03/14/2017) 1 Inhaler 2  .  albuterol (PROVENTIL) (2.5 MG/3ML) 0.083% nebulizer solution Take 3 mLs (2.5 mg total) by nebulization every 6 (six) hours as needed for wheezing or shortness of breath. (Patient not taking: Reported on 03/14/2017) 150 mL 1  . diclofenac sodium (VOLTAREN) 1 % GEL Apply 2 g topically 4 (four) times daily. (Patient not taking: Reported on 03/14/2017) 100 g 2  . fluticasone furoate-vilanterol (BREO ELLIPTA) 100-25 MCG/INH AEPB Inhale 1 puff into the lungs daily. (Patient not taking: Reported on 03/14/2017) 3 each 0  . Multiple Vitamin (MULTIVITAMIN) capsule Take 1 capsule by mouth daily. (Patient not taking: Reported on 03/14/2017) 100 capsule 1  . oxybutynin (DITROPAN-XL) 5 MG 24 hr tablet Take 1 tablet (5 mg total) by mouth at bedtime. (Patient not taking: Reported on 03/14/2017) 30 tablet 3  . thiamine (VITAMIN B-1) 100 MG tablet Take 1 tablet (100 mg total) by mouth daily. (Patient not taking: Reported on 03/14/2017) 100 tablet 1   No current facility-administered medications on file prior to visit.     No Known Allergies  Social History   Socioeconomic History  . Marital status: Single    Spouse name: Not on file  . Number of children: Not on file  . Years of education: Not on file  . Highest education level: Not on file  Social Needs  . Financial resource strain: Not on file  . Food insecurity - worry:  Not on file  . Food insecurity - inability: Not on file  . Transportation needs - medical: Not on file  . Transportation needs - non-medical: Not on file  Occupational History  . Not on file  Tobacco Use  . Smoking status: Current Every Day Smoker    Packs/day: 1.00    Types: Cigarettes    Start date: 07/08/2015  . Smokeless tobacco: Never Used  . Tobacco comment: a pack in a couple of days  Substance and Sexual Activity  . Alcohol use: Yes    Alcohol/week: 1.8 - 2.4 oz    Types: 3 - 4 Cans of beer per week    Comment: daily  . Drug use: No  . Sexual activity: Not on file  Other Topics  Concern  . Not on file  Social History Narrative  . Not on file    Family History  Problem Relation Age of Onset  . Heart attack Mother   . Cirrhosis Father     Past Surgical History:  Procedure Laterality Date  . arm surgery    . DECORTICATION Right 06/03/2015   Procedure: DECORTICATION;  Surgeon: [name], MD;  Location: [facility] OR;  Service: Thoracic;  Laterality: Right;  Marland Kitchen VIDEO ASSISTED THORACOSCOPY (VATS)/EMPYEMA Right 06/03/2015   Procedure: VIDEO ASSISTED THORACOSCOPY (VATS)/EMPYEMA;  Surgeon: [name], MD;  Location: [facility] OR;  Service: Thoracic;  Laterality: Right;    ROS: Review of Systems Negative except as stated above. PHYSICAL EXAM: BP (!) 123/91   Pulse 90   Temp 98 F (36.7 C) (Oral)   Resp 16   Ht 5\' 9"  (1.753 m)   Wt 141 lb (64 kg)   SpO2 97%   BMI 20.82 kg/m   Wt Readings from Last 3 Encounters:  03/14/17 141 lb (64 kg)  11/06/16 133 lb 6.4 oz (60.5 kg)  06/16/16 130 lb (59 kg)    Physical Exam  General appearance - alert, well appearing, and in no distress Mental status - alert, oriented to person, place, and time, normal mood, behavior, speech, dress, motor activity, and thought processes Neck - supple, no significant adenopathy Chest - clear to auscultation, no wheezes, rales or rhonchi, symmetric air entry Heart - normal rate, regular rhythm, normal S1, S2, no murmurs, rubs, clicks or gallops Extremities - peripheral pulses normal, no pedal edema, no clubbing or cyanosis  EKG: Normal sinus rhythm unchanged from previous. ASSESSMENT AND PLAN: 1. Tobacco abuse Patient advised to quit smoking. Discussed health risks associated with smoking including lung and other types of cancers, chronic lung diseases and CV risks.. Pt wanting to cut back with hopes of quitting..  Discussed methods to help quit including quitting cold [place], use of NRT, Chantix and Bupropion.  Would like to try the nicotine patches.  Patient given  information to call 1 800 quit now to get the patches for free.   2. Essential hypertension Not at goal.  Advised him to take the Norvasc 10 mg daily.  3. Chest pain, unspecified type - Ambulatory referral to Cardiology - EKG 12-Lead  4. Lung nodule, multiple Keep appointment tomorrow for follow-up screening CT  5. Colon cancer screening - Fecal occult blood, imunochemical([facility])  6. ETOH abuse Continue to encourage him to cut back and to consider getting into an outpatient rehab program  Patient was given the opportunity to ask questions.  Patient verbalized understanding of the plan and was able to repeat key elements of the plan.   Orders Placed  This Encounter  Procedures  . Fecal occult blood, imunochemical([facility])  . Ambulatory referral to Cardiology  . EKG 12-Lead     Requested Prescriptions    No prescriptions requested or ordered in this encounter    Return in about 3 months (around 06/14/2017).  Jonah Blueeborah Atianna Haidar, MD, FACP

## 2017-03-14 NOTE — Patient Instructions (Addendum)
Call [phone] Now for the Nicotine Patches Take a Baby Aspirin 81 mg daily.

## 2017-03-15 ENCOUNTER — Encounter: Payer: Self-pay | Admitting: Acute Care

## 2017-03-15 ENCOUNTER — Ambulatory Visit (INDEPENDENT_AMBULATORY_CARE_PROVIDER_SITE_OTHER)
Admission: RE | Admit: 2017-03-15 | Discharge: 2017-03-15 | Disposition: A | Discharge status: Home / Self Care | Payer: Self-pay | Source: Ambulatory Visit | Attending: Acute Care | Admitting: Acute Care

## 2017-03-15 ENCOUNTER — Ambulatory Visit (INDEPENDENT_AMBULATORY_CARE_PROVIDER_SITE_OTHER): Payer: Self-pay | Admitting: Acute Care

## 2017-03-15 DIAGNOSIS — F17210 Nicotine dependence, cigarettes, uncomplicated: Secondary | ICD-10-CM

## 2017-03-15 DIAGNOSIS — Z122 Encounter for screening for malignant neoplasm of respiratory organs: Secondary | ICD-10-CM

## 2017-03-15 NOTE — Addendum Note (Signed)
Addended by: Kandice RobinsonsGROCE, Malachy Coleman F on: 03/15/2017 04:39 PM   Modules accepted: Level of Service

## 2017-03-15 NOTE — Progress Notes (Signed)
Shared Decision Making Visit Lung Cancer Screening Program [phone])   Eligibility:  Age 57 y.o.  Pack Years Smoking History Calculation 47 pack year smoking history (# packs/per year x # years smoked)  Recent History of coughing up blood  no  Unexplained weight loss? no ( >Than 15 pounds within the last 6 months )  Prior History Lung / other cancer no (Diagnosis within the last 5 years already requiring surveillance chest CT Scans).  Smoking Status Current Smoker  Former Smokers: Years since quit: NA  Quit Date: NA  Visit Components:  Discussion included one or more decision making aids. yes  Discussion included risk/benefits of screening. yes  Discussion included potential follow up diagnostic testing for abnormal scans. yes  Discussion included meaning and risk of over diagnosis. yes  Discussion included meaning and risk of False Positives. yes  Discussion included meaning of total radiation exposure. yes  Counseling Included:  Importance of adherence to annual lung cancer LDCT screening. yes  Impact of comorbidities on ability to participate in the program. yes  Ability and willingness to under diagnostic treatment. yes  Smoking Cessation Counseling:  Current Smokers:   Discussed importance of smoking cessation. yes  Information about tobacco cessation classes and interventions provided to patient. yes  Patient provided with "ticket" for LDCT Scan. yes  Symptomatic Patient. no  Counseling  Diagnosis Code: Tobacco Use Z72.0  Asymptomatic Patient yes  Counseling (Intermediate counseling: > three minutes counseling) U0454  Former Smokers:   Discussed the importance of maintaining cigarette abstinence. yes  Diagnosis Code: Personal History of Nicotine Dependence. U98.119  Information about tobacco cessation classes and interventions provided to patient. Yes  Patient provided with "ticket" for LDCT Scan. yes  Written Order for Lung Cancer  Screening with LDCT placed in Epic. Yes (CT Chest Lung Cancer Screening Low Dose W/O CM) [id number] Z12.2-Screening of respiratory organs   I have spent 25 minutes of face to face time with Brent Warren discussing the risks and benefits of lung cancer screening. We viewed a power point together that explained in detail the above noted topics. We paused at intervals to allow for questions to be asked and answered to ensure understanding.We discussed that the single most powerful action that he can take to decrease his risk of developing lung cancer is to quit smoking. We discussed whether or not he is ready to commit to setting a quit date. He is not ready to set a quit date. We discussed options for tools to aid in quitting smoking including nicotine replacement therapy, non-nicotine medications, support groups, Quit Smart classes, and behavior modification. We discussed that often times setting smaller, more achievable goals, such as eliminating 1 cigarette a day for a week and then 2 cigarettes a day for a week can be helpful in slowly decreasing the number of cigarettes smoked. This allows for a sense of accomplishment as well as providing a clinical benefit. I gave him the " Be Stronger Than Your Excuses" card with contact information for community resources, classes, free nicotine replacement therapy, and access to mobile apps, text messaging, and on-line smoking cessation help. I have also given him my card and contact information in the event he needs to contact me. We discussed the time and location of the scan, and that either Brent Miyamoto RN or I will call with the results within 24-48 hours of receiving them. I have offered him  a copy of the power point we viewed  as a resource in the  event they need reinforcement of the concepts we discussed today in the office. The patient verbalized understanding of all of  the above and had no further questions upon leaving the office. They have my contact information  in the event they have any further questions.  I spent 4 minutes counseling on smoking cessation and the health risks of continued tobacco abuse.  I explained to the patient that there has been a high incidence of coronary artery disease noted on these exams. I explained that this is a non-gated exam therefore degree or severity cannot be determined. This patient is not on statin therapy. I have asked the patient to follow-up with their PCP regarding any incidental finding of coronary artery disease and management with diet or medication as their PCP  feels is clinically indicated. The patient verbalized understanding of the above and had no further questions upon completion of the visit.   Z87.891-Personal history of nicotine dependence   Brent NgoSarah F Groce, NP 03/15/2017 4:03 PM

## 2017-03-19 ENCOUNTER — Telehealth: Payer: Self-pay | Admitting: Internal Medicine

## 2017-03-19 MED FILL — OXYBUTYNIN CL ER 5 MG TAB: 5 | 30 days supply | Qty: 30 | Fill #1

## 2017-03-19 NOTE — Telephone Encounter (Signed)
Pt called and wants a call back in regards to his appointment, please follow up when you get a chance

## 2017-03-20 LAB — FECAL OCCULT BLOOD, IMMUNOCHEMICAL: FECAL OCCULT BLD: NEGATIVE

## 2017-03-22 ENCOUNTER — Telehealth: Payer: Self-pay

## 2017-03-22 NOTE — Telephone Encounter (Signed)
Contacted pt and informed his cologuard test was negative and to repeat in 1 year pt doesn't have any questions or concerns

## 2017-03-27 ENCOUNTER — Ambulatory Visit: Payer: Self-pay | Attending: Internal Medicine

## 2017-03-27 ENCOUNTER — Other Ambulatory Visit: Payer: Self-pay

## 2017-03-27 ENCOUNTER — Telehealth: Payer: Self-pay | Admitting: Internal Medicine

## 2017-03-27 MED ORDER — ALBUTEROL SULFATE (2.5 MG/3ML) 0.083% IN NEBU: 2.5000 mg | INHALATION_SOLUTION | Freq: Four times a day (QID) | RESPIRATORY_TRACT | 3 refills | Status: DC | PRN

## 2017-03-27 MED FILL — AMLODIPINE BESYLATE 10 MG T: 10 | 30 days supply | Qty: 30 | Fill #0

## 2017-03-27 NOTE — Telephone Encounter (Signed)
Contacted pt to see if he was in the office still pt came back in to fill out form for nebulizer machine will place in pcp box for signature

## 2017-03-27 NOTE — Telephone Encounter (Signed)
Pt came in to request authorization to receive a nebulizer  At [facility]  Please follow up

## 2017-03-28 ENCOUNTER — Other Ambulatory Visit: Payer: Self-pay | Admitting: Acute Care

## 2017-03-28 DIAGNOSIS — Z122 Encounter for screening for malignant neoplasm of respiratory organs: Secondary | ICD-10-CM

## 2017-03-28 DIAGNOSIS — F17210 Nicotine dependence, cigarettes, uncomplicated: Secondary | ICD-10-CM

## 2017-04-02 DIAGNOSIS — R079 Chest pain, unspecified: Secondary | ICD-10-CM

## 2017-04-02 HISTORY — DX: Chest pain, unspecified: R07.9

## 2017-04-03 ENCOUNTER — Encounter: Payer: Self-pay | Admitting: Cardiology

## 2017-04-03 ENCOUNTER — Ambulatory Visit (INDEPENDENT_AMBULATORY_CARE_PROVIDER_SITE_OTHER): Payer: Self-pay | Admitting: Cardiology

## 2017-04-03 VITALS — BP 148/90 | HR 60 | Ht 69.0 in | Wt 132.1 lb

## 2017-04-03 DIAGNOSIS — R079 Chest pain, unspecified: Secondary | ICD-10-CM

## 2017-04-03 DIAGNOSIS — J439 Emphysema, unspecified: Secondary | ICD-10-CM

## 2017-04-03 DIAGNOSIS — I10 Essential (primary) hypertension: Secondary | ICD-10-CM
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
## 2017-04-03 NOTE — Patient Instructions (Signed)
Medication Instructions:  Your physician recommends that you continue on your current medications as directed. Please refer to the Current Medication list given to you today.  Labwork: Your physician recommends that you have the following labs drawn: lipid  Testing/Procedures: You had an EKG today.  Your physician has requested that you have a stress echocardiogram. For further information please visit [email or web address]. Please follow instruction sheet as given.  Follow-Up: Your physician recommends that you schedule a follow-up appointment in: 4 weeks.  Any Other Special Instructions Will Be Listed Below (If Applicable).     If you need a refill on your cardiac medications before your next appointment, please call your pharmacy.

## 2017-04-04 LAB — LIPID PANEL
CHOL/HDL RATIO: 1.7 ratio (ref 0.0–5.0)
Cholesterol, Total: 183 mg/dL (ref 100–199)
HDL: 106 mg/dL (ref >39)
LDL CALC: 67 mg/dL (ref 0–99)
Triglycerides: 50 mg/dL (ref 0–149)
VLDL Cholesterol Cal: 10 mg/dL (ref 5–40)

## 2017-04-09 MED FILL — ALBUTEROL SUL 2.5 MG/3 ML S: (2.5 MG/3ML | 12 days supply | Qty: 150 | Fill #0

## 2017-04-16 NOTE — Telephone Encounter (Signed)
Spoke to patient and his brother

## 2017-05-02 ENCOUNTER — Ambulatory Visit (HOSPITAL_BASED_OUTPATIENT_CLINIC_OR_DEPARTMENT_OTHER)
Admission: RE | Admit: 2017-05-02 | Discharge: 2017-05-02 | Disposition: A | Discharge status: Home / Self Care | Payer: Self-pay | Source: Ambulatory Visit | Attending: Cardiology | Admitting: Cardiology

## 2017-05-02 ENCOUNTER — Other Ambulatory Visit: Payer: Self-pay

## 2017-05-02 DIAGNOSIS — Z79899 Other long term (current) drug therapy: Secondary | ICD-10-CM | POA: Insufficient documentation

## 2017-05-02 DIAGNOSIS — F17210 Nicotine dependence, cigarettes, uncomplicated: Secondary | ICD-10-CM | POA: Insufficient documentation

## 2017-05-02 DIAGNOSIS — R079 Chest pain, unspecified: Secondary | ICD-10-CM | POA: Insufficient documentation

## 2017-05-02 DIAGNOSIS — I10 Essential (primary) hypertension: Secondary | ICD-10-CM | POA: Insufficient documentation

## 2017-05-02 MED ORDER — NITROGLYCERIN 0.4 MG SL SUBL: 0.4000 mg | SUBLINGUAL_TABLET | SUBLINGUAL | 11 refills | Status: DC | PRN

## 2017-05-02 MED FILL — NITROGLYCERIN 0.4 MG TAB SL: 0.4 | 25 days supply | Qty: 25 | Fill #0

## 2017-05-02 NOTE — Progress Notes (Signed)
Echocardiogram Echocardiogram Stress Test has been performed.  Dorothey BasemanReel, Shavaughn Seidl M 05/02/2017, 11:41 AM

## 2017-05-05 NOTE — Progress Notes (Signed)
Cardiology Office Note:    Date:  05/06/2017   ID:  Brent Warren, DOB 28-Aug-1960, MRN [id number]  PCP:  Marcine Matar, MD  Cardiologist:  Norman Herrlich, MD    Referring MD: Marcine Matar, MD    ASSESSMENT:    1. Chest pain in adult   2. Pulmonary emphysema, unspecified emphysema type (HCC)   3. Essential hypertension    PLAN:    In order of problems listed above:  1. Stable his stress study showed no evidence of ischemia and for the time being he will continue medical treatment including nitroglycerin as needed and if he is having frequent episodes consider either cardiac CTA or coronary angiography. 2. Stable continue his bronchodilators 3. Stable continue his current treatment calcium channel blocker   Next appointment: 6 months   Medication Adjustments/Labs and Tests Ordered: Current medicines are reviewed at length with the patient today.  Concerns regarding medicines are outlined above.  No orders of the defined types were placed in this encounter.  No orders of the defined types were placed in this encounter.   Chief Complaint  Patient presents with  . Follow-up  . Chest Pain    History of Present Illness:    Brent Warren is a 57 y.o. male with a hx of  COPD previous thoracic surgery decortication right lung and hypertension  last seen 04/03/17 for atytpical chest pain-recumbent nocturnal angina.  Stress echo 04/25.19:Study Conclusions - Stress: There was a normal resting blood pressure with a blunted response to stress. The patient experienced no chest pain during   stress. Functional capacity was slightly decreased (20% to 30%). - Stress ECG conclusions: There were no stress arrhythmias or conduction abnormalities. The stress ECG was negative for   ischemia. - Staged echo: There was no echocardiographic evidence for stress-induced ischemia.  Compliance with diet, lifestyle and medications: Yes He has had no chest pain since time of his stress test  has not needed nitroglycerin. Past Medical History:  Diagnosis Date  . Alcoholic (HCC)   . Chest pain 04/02/2017  . COPD (chronic obstructive pulmonary disease) (HCC)   . 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 ETOH abuse 11/06/2016  . Hepatitis 05/22/2015  . HTN (hypertension)   . Immunization due 11/06/2016  . Lung nodule, multiple 11/08/2015  . Malnutrition of moderate degree 06/02/2015  . Pneumonia 05/2015  . Pulmonary emphysema (HCC) 11/06/2016  . Tobacco abuse   . Urge incontinence 11/06/2016    Past Surgical History:  Procedure Laterality Date  . arm surgery    . DECORTICATION Right 06/03/2015   Procedure: DECORTICATION;  Surgeon: [name], MD;  Location[facility] OR;  Service: Thoracic;  Laterality: Right;  Marland Kitchen VIDEO ASSISTED THORACOSCOPY (VATS)/EMPYEMA Right 06/03/2015   Procedure: VIDEO ASSISTED THORACOSCOPY (VATS)/EMPYEMA;  Surgeon: [name], MD;  Location: [facility] OR;  Service: Thoracic;  Laterality: Right;    Current Medications: Current Meds  Medication Sig  . albuterol (PROVENTIL HFA;VENTOLIN HFA) 108 (90 Base) MCG/ACT inhaler Inhale 2 puffs into the lungs every 6 (six) hours as needed for wheezing or shortness of breath.  Marland Kitchen albuterol (PROVENTIL) (2.5 MG/3ML) 0.083% nebulizer solution Take 3 mLs (2.5 mg total) by nebulization every 6 (six) hours as needed for wheezing or shortness of breath.  Marland Kitchen amLODipine (NORVASC) 10 MG tablet Take 1 tablet (10 mg total) by mouth daily.  . fluticasone furoate-vilanterol (BREO ELLIPTA) 100-25 MCG/INH AEPB Inhale 1 puff into the lungs daily.  . nitroGLYCERIN (NITROSTAT) 0.4  MG SL tablet Place 1 tablet (0.4 mg total) under the tongue every 5 (five) minutes as needed for chest pain.  Marland Kitchen oxybutynin (DITROPAN-XL) 5 MG 24 hr tablet Take 1 tablet (5 mg total) by mouth at bedtime.     Allergies:   Patient has no known allergies.   Social History   Socioeconomic History  . Marital status: Single    Spouse name: Not on file    . Number of children: Not on file  . Years of education: Not on file  . Highest education level: Not on file  Occupational History  . Not on file  Social Needs  . Financial resource strain: Not on file  . Food insecurity:    Worry: Not on file    Inability: Not on file  . Transportation needs:    Medical: Not on file    Non-medical: Not on file  Tobacco Use  . Smoking status: Current Every Day Smoker    Packs/day: 1.00    Years: 47.00    Pack years: 47.00    Types: Cigarettes    Start date: 07/08/2015  . Smokeless tobacco: Never Used  . Tobacco comment: a pack in a couple of days  Substance and Sexual Activity  . Alcohol use: Yes    Alcohol/week: 1.8 - 2.4 oz    Types: 3 - 4 Cans of beer per week    Comment: daily  . Drug use: No  . Sexual activity: Not on file  Lifestyle  . Physical activity:    Days per week: Not on file    Minutes per session: Not on file  . Stress: Not on file  Relationships  . Social connections:    Talks on phone: Not on file    Gets together: Not on file    Attends religious service: Not on file    Active member of club or organization: Not on file    Attends meetings of clubs or organizations: Not on file    Relationship status: Not on file  Other Topics Concern  . Not on file  Social History Narrative  . Not on file       Family History: The patient's family history includes Cirrhosis in his father; Heart attack in his brother and mother; Stroke in his sister. ROS:   Please see the history of present illness.    All other systems reviewed and are negative.  EKGs/Labs/Other Studies Reviewed:    The following studies were reviewed today:  Recent Labs: 06/16/2016: ALT 46; BUN <5; Creatinine, Ser 0.69; Hemoglobin 14.0; Platelets 185; Potassium 3.9; Sodium 137  Recent Lipid Panel    Component Value Date/Time   CHOL 183 04/03/2017 0949   TRIG 50 04/03/2017 0949   HDL 106 04/03/2017 0949   CHOLHDL 1.7 04/03/2017 0949   LDLCALC 67  04/03/2017 0949    Physical Exam:    VS:  BP 140/82 (BP Location: Right Arm, Patient Position: Sitting, Cuff Size: Normal)   Pulse 73   Ht  (1.753 m)   Wt 134 lb (60.8 kg)   SpO2 98%   BMI 19.79 kg/m     Wt Readings from Last 3 Encounters:  05/06/17 134 lb (60.8 kg)  04/03/17 132 lb 1.9 oz (59.9 kg)  03/14/17 141 lb (64 kg)     GEN:  Well nourished, well developed in no acute distress HEENT: Normal NECK: No JVD; No carotid bruits LYMPHATICS: No lymphadenopathy CARDIAC: RRR, no murmurs, rubs, gallops RESPIRATORY:  Clear to auscultation without rales, wheezing or rhonchi  ABDOMEN: Soft, non-tender, non-distended MUSCULOSKELETAL:  No edema; No deformity  SKIN: Warm and dry NEUROLOGIC:  Alert and oriented x 3 PSYCHIATRIC:  Normal affect    Signed, Norman Herrlich, MD  05/06/2017 9:41 AM    [facility]

## 2017-05-06 ENCOUNTER — Ambulatory Visit (INDEPENDENT_AMBULATORY_CARE_PROVIDER_SITE_OTHER): Payer: No Typology Code available for payment source | Admitting: Cardiology

## 2017-05-06 ENCOUNTER — Encounter: Payer: Self-pay | Admitting: Cardiology

## 2017-05-06 ENCOUNTER — Other Ambulatory Visit: Payer: Self-pay | Admitting: Internal Medicine

## 2017-05-06 VITALS — BP 140/82 | HR 73 | Ht 69.0 in | Wt 134.0 lb

## 2017-05-06 DIAGNOSIS — I10 Essential (primary) hypertension: Secondary | ICD-10-CM

## 2017-05-06 DIAGNOSIS — R079 Chest pain, unspecified: Secondary | ICD-10-CM

## 2017-05-06 DIAGNOSIS — J439 Emphysema, unspecified: Secondary | ICD-10-CM

## 2017-05-06 MED FILL — AMLODIPINE BESYLATE 10 MG T: 10 | 30 days supply | Qty: 30 | Fill #0

## 2017-05-06 MED FILL — ?OXYBUTYNIN CL ER 5MG TABLE: 5 | 30 days supply | Qty: 30 | Fill #2

## 2017-05-06 NOTE — Patient Instructions (Signed)

## 2017-05-07 ENCOUNTER — Telehealth: Payer: Self-pay

## 2017-05-07 NOTE — Telephone Encounter (Signed)
Records received from [facility]. Placed in Chart Prep.

## 2017-06-07 ENCOUNTER — Telehealth: Payer: Self-pay | Admitting: *Deleted

## 2017-06-07 NOTE — Telephone Encounter (Signed)
Patient brother called and states he has last financial piece and will bring today to deisy.

## 2017-06-18 DIAGNOSIS — I10 Essential (primary) hypertension: Secondary | ICD-10-CM

## 2017-08-02 ENCOUNTER — Other Ambulatory Visit: Payer: Self-pay | Admitting: Internal Medicine

## 2017-08-02 DIAGNOSIS — I10 Essential (primary) hypertension: Secondary | ICD-10-CM

## 2017-08-19 ENCOUNTER — Telehealth: Payer: Self-pay

## 2017-08-19 NOTE — Telephone Encounter (Signed)
As per Mliss FritzJay'a  Pollack, CMA, the patient was given a nebulizer from [employer] months ago.  Call placed to [employer] # [phone], spoke to Darel HongJudy who stated that she would fax a copy of the order to this CM so that the order can be processed for payment.

## 2017-08-27 ENCOUNTER — Telehealth: Payer: Self-pay

## 2017-08-27 NOTE — Telephone Encounter (Signed)
Copy of order received from [employer] and given to Audelia HivesJ Parker, [facility] Practice Administrator for processing.

## 2017-09-12 ENCOUNTER — Encounter: Payer: Self-pay | Admitting: Internal Medicine

## 2017-09-12 ENCOUNTER — Ambulatory Visit: Payer: Self-pay | Attending: Internal Medicine | Admitting: Internal Medicine

## 2017-09-12 VITALS — BP 169/96 | HR 85 | Temp 98.0°F | Resp 16 | Wt 135.2 lb

## 2017-09-12 DIAGNOSIS — I10 Essential (primary) hypertension: Secondary | ICD-10-CM

## 2017-09-12 DIAGNOSIS — J432 Centrilobular emphysema: Secondary | ICD-10-CM | POA: Insufficient documentation

## 2017-09-12 DIAGNOSIS — F1010 Alcohol abuse, uncomplicated: Secondary | ICD-10-CM

## 2017-09-12 DIAGNOSIS — F17210 Nicotine dependence, cigarettes, uncomplicated: Secondary | ICD-10-CM | POA: Insufficient documentation

## 2017-09-12 DIAGNOSIS — Z131 Encounter for screening for diabetes mellitus: Secondary | ICD-10-CM

## 2017-09-12 DIAGNOSIS — R918 Other nonspecific abnormal finding of lung field: Secondary | ICD-10-CM

## 2017-09-12 DIAGNOSIS — Z720 Tobacco use: Secondary | ICD-10-CM

## 2017-09-12 DIAGNOSIS — Z823 Family history of stroke: Secondary | ICD-10-CM | POA: Insufficient documentation

## 2017-09-12 DIAGNOSIS — R200 Anesthesia of skin: Secondary | ICD-10-CM

## 2017-09-12 DIAGNOSIS — Z8379 Family history of other diseases of the digestive system: Secondary | ICD-10-CM | POA: Insufficient documentation

## 2017-09-12 DIAGNOSIS — Z7951 Long term (current) use of inhaled steroids: Secondary | ICD-10-CM | POA: Insufficient documentation

## 2017-09-12 DIAGNOSIS — Z79899 Other long term (current) drug therapy: Secondary | ICD-10-CM | POA: Insufficient documentation

## 2017-09-12 DIAGNOSIS — Z23 Encounter for immunization: Secondary | ICD-10-CM

## 2017-09-12 DIAGNOSIS — Z8249 Family history of ischemic heart disease and other diseases of the circulatory system: Secondary | ICD-10-CM | POA: Insufficient documentation

## 2017-09-12 MED FILL — AMLODIPINE BESYLATE 10 MG T: 10 | 30 days supply | Qty: 30 | Fill #0

## 2017-09-12 NOTE — Patient Instructions (Addendum)
The greatest risk to your health is the smoking and drinking more than you should.  I strongly encourage you to discontinue smoking and to cut back on drinking as discussed today.  Your blood pressure is not controlled.  I recommend taking the amlodipine as described.  Influenza Virus Vaccine injection (Fluarix) What is this medicine? INFLUENZA VIRUS VACCINE (in floo EN zuh VAHY ruhs vak SEEN) helps to reduce the risk of getting influenza also known as the flu. This medicine may be used for other purposes; ask your health care provider or pharmacist if you have questions. COMMON BRAND NAME(S): Fluarix, Fluzone What should I tell my health care provider before I take this medicine? They need to know if you have any of these conditions: -bleeding disorder like hemophilia -fever or infection -Guillain-Barre syndrome or other neurological problems -immune system problems -infection with the human immunodeficiency virus (HIV) or AIDS -low blood platelet counts -multiple sclerosis -an unusual or allergic reaction to influenza virus vaccine, eggs, chicken proteins, latex, gentamicin, other medicines, foods, dyes or preservatives -pregnant or trying to get pregnant -breast-feeding How should I use this medicine? This vaccine is for injection into a muscle. It is given by a health care professional. A copy of Vaccine Information Statements will be given before each vaccination. Read this sheet carefully each time. The sheet may change frequently. Talk to your pediatrician regarding the use of this medicine in children. Special care may be needed. Overdosage: If you think you have taken too much of this medicine contact a poison control center or emergency room at once. NOTE: This medicine is only for you. Do not share this medicine with others. What if I miss a dose? This does not apply. What may interact with this medicine? -chemotherapy or radiation therapy -medicines that lower your immune  system like etanercept, anakinra, infliximab, and adalimumab -medicines that treat or prevent blood clots like warfarin -phenytoin -steroid medicines like prednisone or cortisone -theophylline -vaccines This list may not describe all possible interactions. Give your health care provider a list of all the medicines, herbs, non-prescription drugs, or dietary supplements you use. Also tell them if you smoke, drink alcohol, or use illegal drugs. Some items may interact with your medicine. What should I watch for while using this medicine? Report any side effects that do not go away within 3 days to your doctor or health care professional. Call your health care provider if any unusual symptoms occur within 6 weeks of receiving this vaccine. You may still catch the flu, but the illness is not usually as bad. You cannot get the flu from the vaccine. The vaccine will not protect against colds or other illnesses that may cause fever. The vaccine is needed every year. What side effects may I notice from receiving this medicine? Side effects that you should report to your doctor or health care professional as soon as possible: -allergic reactions like skin rash, itching or hives, swelling of the face, lips, or tongue Side effects that usually do not require medical attention (report to your doctor or health care professional if they continue or are bothersome): -fever -headache -muscle aches and pains -pain, tenderness, redness, or swelling at site where injected -weak or tired This list may not describe all possible side effects. Call your doctor for medical advice about side effects. You may report side effects to FDA at 1-800-FDA-1088. Where should I keep my medicine? This vaccine is only given in a clinic, pharmacy, doctor's office, or other health care  setting and will not be stored at home. NOTE: This sheet is a summary. It may not cover all possible information. If you have questions about this  medicine, talk to your doctor, pharmacist, or health care provider.  2018 Elsevier/Gold Standard (2007-07-23 09:30:40)

## 2017-09-12 NOTE — Progress Notes (Signed)
Patient ID: Brent Warren, male    DOB: 09-21-1960  MRN: [id number]  CC: Hypertension and Fatigue   Subjective: Raphael Fitzpatrick is a 57 y.o. male who presents for chronic ds management His concerns today include:  Patient with history ofhtn, copd, tob and etoh abuse, hx of Right empyema sp VATS 5/17, with findings of multiple lung nodules on CT scan 9/17.  Saw cardiology for CP eval since last visit.  Had neg stress echo.  Given SL nitro  Nonetheless to use PRN No further CP.  Has not had to use SL Nitro  Lung nodules/Tob dep/COPD:  Low dose CT scan of chest revealed stable nodules less than 1 cm in size Still smokes.  Now smoking cigars.  Not ready to quit Denies any chronic cough or SOB  Uses Albuterol PRN Has not used neb machine in a while Does not use Breo  HTN:  Still taking only 5 mg of Norvasc instead of 10 mg as recommended on last visit.  Tries to limit salt intake  Drinks 42 oz beer a day.  C/o intermittent numbness in feet at times.  Wants to be checked for DM  HM:  FIT test was negative Patient Active Problem List   Diagnosis Date Noted  . Chest pain in adult 04/02/2017  . Immunization due 11/06/2016  . Pulmonary emphysema (HCC) 11/06/2016  . Urge incontinence 11/06/2016  . Tobacco abuse 11/06/2016  . ETOH abuse 11/06/2016  . Lung nodule, multiple 11/08/2015  . HTN (hypertension) 08/01/2015  . Malnutrition of moderate degree 06/02/2015  . Hepatitis 05/22/2015     Current Outpatient Medications on File Prior to Visit  Medication Sig Dispense Refill  . albuterol (PROVENTIL HFA;VENTOLIN HFA) 108 (90 Base) MCG/ACT inhaler Inhale 2 puffs into the lungs every 6 (six) hours as needed for wheezing or shortness of breath. 1 Inhaler 2  . albuterol (PROVENTIL) (2.5 MG/3ML) 0.083% nebulizer solution Take 3 mLs (2.5 mg total) by nebulization every 6 (six) hours as needed for wheezing or shortness of breath. 150 mL 3  . amLODipine (NORVASC) 10 MG tablet Take 1 tablet (10 mg  total) by mouth daily. MUST MAKE APPT FOR FURTHER REFILLS 30 tablet 0  . fluticasone furoate-vilanterol (BREO ELLIPTA) 100-25 MCG/INH AEPB Inhale 1 puff into the lungs daily. 3 each 0  . nitroGLYCERIN (NITROSTAT) 0.4 MG SL tablet Place 1 tablet (0.4 mg total) under the tongue every 5 (five) minutes as needed for chest pain. 25 tablet 11  . oxybutynin (DITROPAN-XL) 5 MG 24 hr tablet Take 1 tablet (5 mg total) by mouth at bedtime. 30 tablet 3   No current facility-administered medications on file prior to visit.     No Known Allergies  Social History   Socioeconomic History  . Marital status: Single    Spouse name: Not on file  . Number of children: Not on file  . Years of education: Not on file  . Highest education level: Not on file  Occupational History  . Not on file  Social Needs  . Financial resource strain: Not on file  . Food insecurity:    Worry: Not on file    Inability: Not on file  . Transportation needs:    Medical: Not on file    Non-medical: Not on file  Tobacco Use  . Smoking status: Current Every Day Smoker    Packs/day: 1.00    Years: 47.00    Pack years: 47.00    Types: Cigarettes  Start date: 07/08/2015  . Smokeless tobacco: Never Used  . Tobacco comment: a pack in a couple of days  Substance and Sexual Activity  . Alcohol use: Yes    Alcohol/week: 3.0 - 4.0 standard drinks    Types: 3 - 4 Cans of beer per week    Comment: daily  . Drug use: No  . Sexual activity: Not on file  Lifestyle  . Physical activity:    Days per week: Not on file    Minutes per session: Not on file  . Stress: Not on file  Relationships  . Social connections:    Talks on phone: Not on file    Gets together: Not on file    Attends religious service: Not on file    Active member of club or organization: Not on file    Attends meetings of clubs or organizations: Not on file    Relationship status: Not on file  . Intimate partner violence:    Fear of current or ex  partner: Not on file    Emotionally abused: Not on file    Physically abused: Not on file    Forced sexual activity: Not on file  Other Topics Concern  . Not on file  Social History Narrative  . Not on file    Family History  Problem Relation Age of Onset  . Heart attack Mother   . Cirrhosis Father   . Stroke Sister   . Heart attack Brother     Past Surgical History:  Procedure Laterality Date  . arm surgery    . DECORTICATION Right 06/03/2015   Procedure: DECORTICATION;  Surgeon: [name], MD;  Location: [facility] OR;  Service: Thoracic;  Laterality: Right;  Marland Kitchen VIDEO ASSISTED THORACOSCOPY (VATS)/EMPYEMA Right 06/03/2015   Procedure: VIDEO ASSISTED THORACOSCOPY (VATS)/EMPYEMA;  Surgeon: [name], MD;  Location: [facility] OR;  Service: Thoracic;  Laterality: Right;    ROS: Review of Systems Neg except as above PHYSICAL EXAM: BP (!) 169/96 (BP Location: Right Arm, Cuff Size: Small) Comment: recheck  Pulse 85   Temp 98 F (36.7 C) (Oral)   Resp 16   Wt 135 lb 3.2 oz (61.3 kg)   SpO2 97%   BMI 19.97 kg/m   Physical Exam General appearance - alert, well appearing, and in no distress Mental status - normal mood, behavior, speech, dress, motor activity, and thought processes Mouth - mucous membranes moist, pharynx normal without lesions Neck - supple, no significant adenopathy Chest - clear to auscultation, no wheezes, rales or rhonchi, symmetric air entry Heart - normal rate, regular rhythm, normal S1, S2, no murmurs, rubs, clicks or gallops Extremities - peripheral pulses normal, no pedal edema, no clubbing or cyanosis   ASSESSMENT AND PLAN: 1. Essential hypertension Not at goal.  Recommend taking Norvasc 10 mg as prescribed.  Discussed health risks associated with uncontrolled BP - CBC - Comprehensive metabolic panel  2. Tobacco abuse Patient advised to quit smoking. Discussed health risks associated with smoking including lung and other types of cancers,  chronic lung diseases and CV risks.. Pt not ready to give trail of quitting.   3. ETOH abuse Advised to cut back.  Discussed health risks  4. Lung nodule, multiple Stable Advised to quit smoking  5. Need for immunization against influenza - Flu Vaccine QUAD 36+ mos IM  6. Numbness in feet - Vitamin B12  7. Diabetes mellitus screening - Hemoglobin A1c   Patient was given the opportunity to ask  questions.  Patient verbalized understanding of the plan and was able to repeat key elements of the plan.   Orders Placed This Encounter  Procedures  . Flu Vaccine QUAD 36+ mos IM  . Hemoglobin A1c  . CBC  . Comprehensive metabolic panel  . Vitamin B12     Requested Prescriptions    No prescriptions requested or ordered in this encounter    Return in about 4 months (around 01/12/2018).  Jonah Blue, MD, FACP

## 2017-09-13 LAB — CBC
HEMATOCRIT: 39.3 % (ref 37.5–51.0)
Hemoglobin: 13.6 g/dL (ref 13.0–17.7)
MCH: 35.4 pg — ABNORMAL HIGH (ref 26.6–33.0)
MCHC: 34.6 g/dL (ref 31.5–35.7)
MCV: 102 fL — AB (ref 79–97)
Platelets: 175 10*3/uL (ref 150–450)
RBC: 3.84 x10E6/uL — ABNORMAL LOW (ref 4.14–5.80)
RDW: 13.2 % (ref 12.3–15.4)
WBC: 5.9 10*3/uL (ref 3.4–10.8)

## 2017-09-13 LAB — COMPREHENSIVE METABOLIC PANEL
ALBUMIN: 4.8 g/dL (ref 3.5–5.5)
ALK PHOS: 77 IU/L (ref 39–117)
ALT: 45 IU/L — ABNORMAL HIGH (ref 0–44)
AST: 87 IU/L — ABNORMAL HIGH (ref 0–40)
Albumin/Globulin Ratio: 1.3 (ref 1.2–2.2)
BILIRUBIN TOTAL: 0.3 mg/dL (ref 0.0–1.2)
BUN / CREAT RATIO: 8 — AB (ref 9–20)
BUN: 6 mg/dL (ref 6–24)
CHLORIDE: 99 mmol/L (ref 96–106)
CO2: 25 mmol/L (ref 20–29)
CREATININE: 0.74 mg/dL — AB (ref 0.76–1.27)
Calcium: 9.8 mg/dL (ref 8.7–10.2)
GFR calc Af Amer: 118 mL/min/{1.73_m2} (ref >59)
GFR calc non Af Amer: 102 mL/min/{1.73_m2} (ref >59)
GLOBULIN, TOTAL: 3.7 g/dL (ref 1.5–4.5)
Glucose: 84 mg/dL (ref 65–99)
POTASSIUM: 4.7 mmol/L (ref 3.5–5.2)
SODIUM: 142 mmol/L (ref 134–144)
Total Protein: 8.5 g/dL (ref 6.0–8.5)

## 2017-09-13 LAB — HEMOGLOBIN A1C
Est. average glucose Bld gHb Est-mCnc: 105 mg/dL
Hgb A1c MFr Bld: 5.3 % (ref 4.8–5.6)

## 2017-09-13 LAB — VITAMIN B12: VITAMIN B 12: 370 pg/mL (ref 232–1245)

## 2017-09-18 ENCOUNTER — Telehealth: Payer: Self-pay

## 2017-09-18 NOTE — Telephone Encounter (Signed)
Contacted pt to go over lab results pt didn't answer and was unable to lvm due to mailbox being full  If pt calls back please give results: Liver enzymes abnormal. Likely due to daily alcohol use. Again, I encourage him to cut back on his daily alcohol consumption. DM screen negative. No anemia. Vit B 12 level is nl.

## 2017-09-19 ENCOUNTER — Telehealth: Payer: Self-pay | Admitting: Internal Medicine

## 2017-09-19 NOTE — Telephone Encounter (Signed)
Patient called and I informed him per CMA [place] that his liver enzymes abnormal. Likely due to daily alcohol use. Again, I encourage him to cut back on his daily alcohol consumption. DM screen negative. No anemia. Vit B 12 level is nl.

## 2017-10-10 ENCOUNTER — Telehealth: Payer: Self-pay | Admitting: Internal Medicine

## 2017-10-10 NOTE — Telephone Encounter (Signed)
Darel Hong from [employer] called to speak with Brent Warren about past statements she would like a call back. Please follow up -[phone] p

## 2017-10-10 NOTE — Telephone Encounter (Signed)
Spoke to [place] with [employer]. She was inquiring about payment for a nebulizer for the patient. Explained to her that the order has been received in 08/2017 and shared with the Practice Administrator.   This CM this CM will check with Audelia Hives, Practice Administrator,  about the status of payment. Darel Hong then refaxed the bill to this CM.  Message sent to Audelia Hives, informing her that Darel Hong would like to know the status of the claim payment.

## 2017-11-21 ENCOUNTER — Encounter: Payer: Self-pay | Admitting: Internal Medicine

## 2017-11-21 ENCOUNTER — Ambulatory Visit: Payer: Self-pay | Attending: Internal Medicine | Admitting: Internal Medicine

## 2017-11-21 VITALS — BP 146/90 | HR 78 | Temp 98.1°F | Resp 16 | Wt 135.2 lb

## 2017-11-21 DIAGNOSIS — I10 Essential (primary) hypertension: Secondary | ICD-10-CM

## 2017-11-21 DIAGNOSIS — R7989 Other specified abnormal findings of blood chemistry: Secondary | ICD-10-CM

## 2017-11-21 DIAGNOSIS — F1010 Alcohol abuse, uncomplicated: Secondary | ICD-10-CM

## 2017-11-21 DIAGNOSIS — Z720 Tobacco use: Secondary | ICD-10-CM

## 2017-11-21 DIAGNOSIS — R918 Other nonspecific abnormal finding of lung field: Secondary | ICD-10-CM | POA: Insufficient documentation

## 2017-11-21 DIAGNOSIS — Z8249 Family history of ischemic heart disease and other diseases of the circulatory system: Secondary | ICD-10-CM | POA: Insufficient documentation

## 2017-11-21 DIAGNOSIS — Z79899 Other long term (current) drug therapy: Secondary | ICD-10-CM | POA: Insufficient documentation

## 2017-11-21 DIAGNOSIS — R945 Abnormal results of liver function studies: Secondary | ICD-10-CM

## 2017-11-21 DIAGNOSIS — F17210 Nicotine dependence, cigarettes, uncomplicated: Secondary | ICD-10-CM | POA: Insufficient documentation

## 2017-11-21 DIAGNOSIS — J439 Emphysema, unspecified: Secondary | ICD-10-CM | POA: Insufficient documentation

## 2017-11-21 MED ORDER — AMLODIPINE BESYLATE 10 MG PO TABS: 10.0000 mg | ORAL_TABLET | Freq: Every day | ORAL | 5 refills | Status: DC

## 2017-11-21 MED ORDER — FLUTICASONE FUROATE-VILANTEROL 100-25 MCG/INH IN AEPB: 1.0000 | INHALATION_SPRAY | Freq: Every day | RESPIRATORY_TRACT | 5 refills | Status: DC

## 2017-11-21 MED FILL — AMLODIPINE BESYLATE 10 MG T: 10 | 30 days supply | Qty: 30 | Fill #0

## 2017-11-21 MED FILL — !BREO ELLIPTA 100-25 MCG IN: 100-25 | 30 days supply | Qty: 60 | Fill #0

## 2017-11-21 NOTE — Patient Instructions (Addendum)
Please call [phone] and request the nicotine patches for free.  If your liver function study has improved, we will consider putting you on Naltrexone to help decrease your cravings for alcohol.

## 2017-11-21 NOTE — Progress Notes (Signed)
Patient ID: Charleston RopesRonnie Face, male    DOB: 06/04/1960  MRN: [id number]  CC:  F/u visit  Subjective: Charleston RopesRonnie Erbe is a 57 y.o. male who presents for follow-up visit.  Patient was last seen about 2 months ago and the plan was for him to follow-up in 4 months.  Patient states that he lost track of his appointment out of town so he decided to come in today. His concerns today include:  Patient with history ofhtn, copd, tob and etoh abuse, hx of Right empyema sp VATS 5/17, with findings of multiple lung nodules on CT scan 9/17.  On last visit with me his AST and ALT were elevated.  Patient reports that he still drinks beer on a daily basis.  He drinks 1 to 2 40 ounce beer a day.  He tells me that his father died of cirrhosis.  He would like to quit himself states that he is smoking and drinking go together.  He was able to quit for 8 years after going through treatment program many years ago.  HTN: On last visit, Norvasc was increased to 10 mg daily.  Reports compliance with this dose but has been out of the medicine for 1 day.  He plans to pick up a refill today.   "I've done away with the salt."   Tob:  "I need to quit those."  He would like to try the nicotine patches but states that he cannot afford them. Patient Active Problem List   Diagnosis Date Noted  . Chest pain in adult 04/02/2017  . Immunization due 11/06/2016  . Pulmonary emphysema (HCC) 11/06/2016  . Urge incontinence 11/06/2016  . Tobacco abuse 11/06/2016  . ETOH abuse 11/06/2016  . Lung nodule, multiple 11/08/2015  . HTN (hypertension) 08/01/2015  . Malnutrition of moderate degree 06/02/2015  . Hepatitis 05/22/2015     Current Outpatient Medications on File Prior to Visit  Medication Sig Dispense Refill  . albuterol (PROVENTIL HFA;VENTOLIN HFA) 108 (90 Base) MCG/ACT inhaler Inhale 2 puffs into the lungs every 6 (six) hours as needed for wheezing or shortness of breath. 1 Inhaler 2  . albuterol (PROVENTIL) (2.5 MG/3ML)  0.083% nebulizer solution Take 3 mLs (2.5 mg total) by nebulization every 6 (six) hours as needed for wheezing or shortness of breath. 150 mL 3  . nitroGLYCERIN (NITROSTAT) 0.4 MG SL tablet Place 1 tablet (0.4 mg total) under the tongue every 5 (five) minutes as needed for chest pain. 25 tablet 11  . oxybutynin (DITROPAN-XL) 5 MG 24 hr tablet Take 1 tablet (5 mg total) by mouth at bedtime. 30 tablet 3   No current facility-administered medications on file prior to visit.     No Known Allergies  Social History   Socioeconomic History  . Marital status: Single    Spouse name: Not on file  . Number of children: Not on file  . Years of education: Not on file  . Highest education level: Not on file  Occupational History  . Not on file  Social Needs  . Financial resource strain: Not on file  . Food insecurity:    Worry: Not on file    Inability: Not on file  . Transportation needs:    Medical: Not on file    Non-medical: Not on file  Tobacco Use  . Smoking status: Current Every Day Smoker    Packs/day: 1.00    Years: 47.00    Pack years: 47.00    Types: Cigarettes  Start date: 07/08/2015  . Smokeless tobacco: Never Used  . Tobacco comment: a pack in a couple of days  Substance and Sexual Activity  . Alcohol use: Yes    Alcohol/week: 3.0 - 4.0 standard drinks    Types: 3 - 4 Cans of beer per week    Comment: daily  . Drug use: No  . Sexual activity: Not on file  Lifestyle  . Physical activity:    Days per week: Not on file    Minutes per session: Not on file  . Stress: Not on file  Relationships  . Social connections:    Talks on phone: Not on file    Gets together: Not on file    Attends religious service: Not on file    Active member of club or organization: Not on file    Attends meetings of clubs or organizations: Not on file    Relationship status: Not on file  . Intimate partner violence:    Fear of current or ex partner: Not on file    Emotionally abused:  Not on file    Physically abused: Not on file    Forced sexual activity: Not on file  Other Topics Concern  . Not on file  Social History Narrative  . Not on file    Family History  Problem Relation Age of Onset  . Heart attack Mother   . Cirrhosis Father   . Stroke Sister   . Heart attack Brother     Past Surgical History:  Procedure Laterality Date  . arm surgery    . DECORTICATION Right 06/03/2015   Procedure: DECORTICATION;  Surgeon: [name], MD;  Location: [facility] OR;  Service: Thoracic;  Laterality: Right;  Marland Kitchen VIDEO ASSISTED THORACOSCOPY (VATS)/EMPYEMA Right 06/03/2015   Procedure: VIDEO ASSISTED THORACOSCOPY (VATS)/EMPYEMA;  Surgeon: [name], MD;  Location[facility] OR;  Service: Thoracic;  Laterality: Right;    ROS: Review of Systems Negative except as stated above PHYSICAL EXAM: BP (!) 146/90   Pulse 78   Temp 98.1 F (36.7 C) (Oral)   Resp 16   Wt 135 lb 3.2 oz (61.3 kg)   SpO2 96%   BMI 19.97 kg/m   Physical Exam  General appearance - alert, well appearing, and in no distress Mental status - normal mood, behavior, speech, dress, motor activity, and thought processes Neck - supple, no significant adenopathy Chest - clear to auscultation, no wheezes, rales or rhonchi, symmetric air entry Heart - normal rate, regular rhythm, normal S1, S2, no murmurs, rubs, clicks or gallops Abdomen - soft, nontender, nondistended, no masses or organomegaly Extremities - peripheral pulses normal, no pedal edema, no clubbing or cyanosis  Results for orders placed or performed in visit on 09/12/17  Hemoglobin A1c  Result Value Ref Range   Hgb A1c MFr Bld 5.3 4.8 - 5.6 %   Est. average glucose Bld gHb Est-mCnc 105 mg/dL  CBC  Result Value Ref Range   WBC 5.9 3.4 - 10.8 x10E3/uL   RBC 3.84 (L) 4.14 - 5.80 x10E6/uL   Hemoglobin 13.6 13.0 - 17.7 g/dL   Hematocrit 16.1 09.6 - 51.0 %   MCV 102 (H) 79 - 97 fL   MCH 35.4 (H) 26.6 - 33.0 pg   MCHC 34.6 31.5 -  35.7 g/dL   RDW 04.5 40.9 - 81.1 %   Platelets 175 150 - 450 x10E3/uL  Comprehensive metabolic panel  Result Value Ref Range   Glucose 84 65 -  99 mg/dL   BUN 6 6 - 24 mg/dL   Creatinine, Ser 9.81 (L) 0.76 - 1.27 mg/dL   GFR calc non Af Amer 102 >59 mL/min/1.73   GFR calc Af Amer 118 >59 mL/min/1.73   BUN/Creatinine Ratio 8 (L) 9 - 20   Sodium 142 134 - 144 mmol/L   Potassium 4.7 3.5 - 5.2 mmol/L   Chloride 99 96 - 106 mmol/L   CO2 25 20 - 29 mmol/L   Calcium 9.8 8.7 - 10.2 mg/dL   Total Protein 8.5 6.0 - 8.5 g/dL   Albumin 4.8 3.5 - 5.5 g/dL   Globulin, Total 3.7 1.5 - 4.5 g/dL   Albumin/Globulin Ratio 1.3 1.2 - 2.2   Bilirubin Total 0.3 0.0 - 1.2 mg/dL   Alkaline Phosphatase 77 39 - 117 IU/L   AST 87 (H) 0 - 40 IU/L   ALT 45 (H) 0 - 44 IU/L  Vitamin B12  Result Value Ref Range   Vitamin B-12 370 232 - 1,245 pg/mL   ASSESSMENT AND PLAN: 1. Essential hypertension Not at goal.  Patient to pick up a refill today on amlodipine.  Continue to limit salt in the foods. - amLODipine (NORVASC) 10 MG tablet; Take 1 tablet (10 mg total) by mouth daily. MUST MAKE APPT FOR FURTHER REFILLS  Dispense: 30 tablet; Refill: 5  2. Tobacco abuse Advised to quit.  I informed him of 1 800 quit now so that he can call and request the nicotine patches for free.  3. ETOH abuse Encourage him to quit or at least cut back.  I discussed with him about trying the medicine naltrexone to help decrease the cravings for alcohol.  He is willing to try it.  We will recheck LFTs first to see whether they have decreased  4. Abnormal LFTs - Hepatic Function Panel   Patient was given the opportunity to ask questions.  Patient verbalized understanding of the plan and was able to repeat key elements of the plan.   Orders Placed This Encounter  Procedures  . Hepatic Function Panel     Requested Prescriptions   Signed Prescriptions Disp Refills  . fluticasone furoate-vilanterol (BREO ELLIPTA) 100-25 MCG/INH  AEPB 1 each 5    Sig: Inhale 1 puff into the lungs daily.  Marland Kitchen amLODipine (NORVASC) 10 MG tablet 30 tablet 5    Sig: Take 1 tablet (10 mg total) by mouth daily. MUST MAKE APPT FOR FURTHER REFILLS    No follow-ups on file.  Jonah Blue, MD, FACP

## 2017-11-22 ENCOUNTER — Telehealth: Payer: Self-pay

## 2017-11-22 LAB — HEPATIC FUNCTION PANEL
ALT: 29 IU/L (ref 0–44)
AST: 38 IU/L (ref 0–40)
Albumin: 4.6 g/dL (ref 3.5–5.5)
Alkaline Phosphatase: 55 IU/L (ref 39–117)
BILIRUBIN, DIRECT: 0.08 mg/dL (ref 0.00–0.40)
Bilirubin Total: <0.2 mg/dL (ref 0.0–1.2)
Total Protein: 7.9 g/dL (ref 6.0–8.5)

## 2017-11-22 NOTE — Telephone Encounter (Signed)
Contacted pt to go over lab results pt didn't answer and was unable to lvm  

## 2018-02-03 MED FILL — AMLODIPINE BESYLATE 10 MG T: 10 | 30 days supply | Qty: 30 | Fill #1

## 2018-02-03 MED FILL — $BREO ELLIPTA 100-25 MCG IH: 100-25 MCG | 90 days supply | Qty: 180 | Fill #1

## 2018-03-28 ENCOUNTER — Inpatient Hospital Stay: Admission: RE | Admit: 2018-03-28 | Payer: Self-pay | Source: Ambulatory Visit

## 2018-04-23 MED FILL — AMLODIPINE BESYLATE 10 MG T: 10 | 30 days supply | Qty: 30 | Fill #2

## 2018-05-26 ENCOUNTER — Other Ambulatory Visit: Payer: Self-pay | Admitting: Acute Care

## 2018-05-26 DIAGNOSIS — Z720 Tobacco use: Secondary | ICD-10-CM

## 2018-06-13 ENCOUNTER — Other Ambulatory Visit: Payer: Self-pay

## 2018-06-13 ENCOUNTER — Ambulatory Visit (INDEPENDENT_AMBULATORY_CARE_PROVIDER_SITE_OTHER)
Admission: RE | Admit: 2018-06-13 | Discharge: 2018-06-13 | Disposition: A | Discharge status: Home / Self Care | Payer: Self-pay | Source: Ambulatory Visit | Attending: Acute Care | Admitting: Acute Care

## 2018-06-13 DIAGNOSIS — Z87891 Personal history of nicotine dependence: Secondary | ICD-10-CM

## 2018-06-13 DIAGNOSIS — Z720 Tobacco use: Secondary | ICD-10-CM

## 2018-06-23 MED FILL — ?AMLODIPINE BESYLATE 10 MG: 10 | 60 days supply | Qty: 60 | Fill #3

## 2018-06-25 ENCOUNTER — Telehealth: Payer: Self-pay

## 2018-06-25 DIAGNOSIS — Z122 Encounter for screening for malignant neoplasm of respiratory organs: Secondary | ICD-10-CM

## 2018-06-25 DIAGNOSIS — F17210 Nicotine dependence, cigarettes, uncomplicated: Secondary | ICD-10-CM

## 2018-06-25 NOTE — Telephone Encounter (Signed)
Pt informed of  Low dose CT results per Eric Form, NP.  PT verbalized understanding.  Copy sent to PCP.  Order placed for 1 yr f/u CT.

## 2018-07-07 ENCOUNTER — Ambulatory Visit: Payer: Self-pay | Attending: Internal Medicine | Admitting: Internal Medicine

## 2018-07-07 ENCOUNTER — Other Ambulatory Visit: Payer: Self-pay

## 2018-07-07 ENCOUNTER — Encounter: Payer: Self-pay | Admitting: Internal Medicine

## 2018-07-07 VITALS — BP 115/76 | HR 102 | Temp 98.6°F | Resp 18 | Ht 69.0 in | Wt 134.0 lb

## 2018-07-07 DIAGNOSIS — I10 Essential (primary) hypertension: Secondary | ICD-10-CM

## 2018-07-07 DIAGNOSIS — Z720 Tobacco use: Secondary | ICD-10-CM

## 2018-07-07 DIAGNOSIS — R7989 Other specified abnormal findings of blood chemistry: Secondary | ICD-10-CM

## 2018-07-07 DIAGNOSIS — R945 Abnormal results of liver function studies: Secondary | ICD-10-CM

## 2018-07-07 DIAGNOSIS — F1010 Alcohol abuse, uncomplicated: Secondary | ICD-10-CM

## 2018-07-07 DIAGNOSIS — R200 Anesthesia of skin: Secondary | ICD-10-CM

## 2018-07-07 DIAGNOSIS — Z1211 Encounter for screening for malignant neoplasm of colon: Secondary | ICD-10-CM

## 2018-07-07 NOTE — Patient Instructions (Signed)
Please work on trying to cut back on drinking and smoking cigarettes.

## 2018-07-07 NOTE — Progress Notes (Addendum)
Patient ID: Brent Warren, male    DOB: August 18, 1960  MRN: [id number]  CC: Hypertension   Subjective: Brent Warren is a 58 y.o. male who presents for chronic disease management His concerns today include:  Patient with history ofhtn, copd, tob and etoh abuse, hx of Right empyema sp VATS 5/17, with findings of multiple lung nodules on CT scan 9/17.  Recently approved for SSI.  HYPERTENSION Currently taking: see medication list Med Adherence: [x]  Yes    []  No Medication side effects: []  Yes    [x]  No Adherence with salt restriction: [x]  Yes    []  No Home Monitoring?: []  Yes    [x]  No Monitoring Frequency: []  Yes    []  No Home BP results range: []  Yes    []  No SOB? []  Yes    [x]  No Chest Pain?: []  Yes    [x]  No Leg swelling?: []  Yes    [x]  No Headaches?: []  Yes    [x]  No Dizziness? []  Yes    [x]  No Comments:   Tob Dep:  Smoking 1 pk/day Did get nicotine patches, lozenges, gum from [id number] NOW but has not started using them because he has not made up his mind to quit as yet. Recently had repeat low dose CT scan for lung cancer screening and follow-up on pulmonary nodules.  Nodules have benign appearance or behavior.  Annual follow-up recommended -Patient denies any chronic cough  ETOH:  Drinks 6 pk/day.  He feels that he should quit but is not ready to do so.  He knows on except health risks that can occur with excessive alcohol use Father died of cirrhosis Soles of feet feel swollen all the time.  No numbness.  Patient Active Problem List   Diagnosis Date Noted  . Chest pain in adult 04/02/2017  . Immunization due 11/06/2016  . Pulmonary emphysema ([place]) 11/06/2016  . Urge incontinence 11/06/2016  . Tobacco abuse 11/06/2016  . ETOH abuse 11/06/2016  . Lung nodule, multiple 11/08/2015  . HTN (hypertension) 08/01/2015  . Malnutrition of moderate degree 06/02/2015  . Hepatitis 05/22/2015     Current Outpatient Medications on File Prior to Visit  Medication Sig Dispense  Refill  . albuterol (PROVENTIL HFA;VENTOLIN HFA) 108 (90 Base) MCG/ACT inhaler Inhale 2 puffs into the lungs every 6 (six) hours as needed for wheezing or shortness of breath. 1 Inhaler 2  . albuterol (PROVENTIL) (2.5 MG/3ML) 0.083% nebulizer solution Take 3 mLs (2.5 mg total) by nebulization every 6 (six) hours as needed for wheezing or shortness of breath. 150 mL 3  . amLODipine (NORVASC) 10 MG tablet Take 1 tablet (10 mg total) by mouth daily. MUST MAKE APPT FOR FURTHER REFILLS 30 tablet 5  . fluticasone furoate-vilanterol (BREO ELLIPTA) 100-25 MCG/INH AEPB Inhale 1 puff into the lungs daily. 1 each 5  . nitroGLYCERIN (NITROSTAT) 0.4 MG SL tablet Place 1 tablet (0.4 mg total) under the tongue every 5 (five) minutes as needed for chest pain. 25 tablet 11  . oxybutynin (DITROPAN-XL) 5 MG 24 hr tablet Take 1 tablet (5 mg total) by mouth at bedtime. (Patient not taking: Reported on 07/07/2018) 30 tablet 3   No current facility-administered medications on file prior to visit.     No Known Allergies  Social History   Socioeconomic History  . Marital status: Single    Spouse name: Not on file  . Number of children: Not on file  . Years of education: Not on file  . Highest  education level: Not on file  Occupational History  . Not on file  Social Needs  . Financial resource strain: Not on file  . Food insecurity    Worry: Not on file    Inability: Not on file  . Transportation needs    Medical: Not on file    Non-medical: Not on file  Tobacco Use  . Smoking status: Current Every Day Smoker    Packs/day: 1.00    Years: 47.00    Pack years: 47.00    Types: Cigarettes    Start date: 07/08/2015  . Smokeless tobacco: Never Used  . Tobacco comment: a pack in a couple of days  Substance and Sexual Activity  . Alcohol use: Yes    Alcohol/week: 2.0 standard drinks    Types: 2 Cans of beer per week    Comment: daily  . Drug use: No  . Sexual activity: Not Currently  Lifestyle  .  Physical activity    Days per week: Not on file    Minutes per session: Not on file  . Stress: Not on file  Relationships  . Social Musicianconnections    Talks on phone: Not on file    Gets together: Not on file    Attends religious service: Not on file    Active member of club or organization: Not on file    Attends meetings of clubs or organizations: Not on file    Relationship status: Not on file  . Intimate partner violence    Fear of current or ex partner: Not on file    Emotionally abused: Not on file    Physically abused: Not on file    Forced sexual activity: Not on file  Other Topics Concern  . Not on file  Social History Narrative  . Not on file    Family History  Problem Relation Age of Onset  . Heart attack Mother   . Cirrhosis Father   . Stroke Sister   . Heart attack Brother     Past Surgical History:  Procedure Laterality Date  . arm surgery    . DECORTICATION Right 06/03/2015   Procedure: DECORTICATION;  Surgeon: Loreli SlotSteven C Hendrickson, MD;  [facility];  Service: Thoracic;  Laterality: Right;  Marland Kitchen. VIDEO ASSISTED THORACOSCOPY (VATS)/EMPYEMA Right 06/03/2015   Procedure: VIDEO ASSISTED THORACOSCOPY (VATS)/EMPYEMA;  Surgeon: Loreli SlotSteven C Hendrickson, MD;  [facility];  Service: Thoracic;  Laterality: Right;    ROS: Review of Systems Negative except as stated above  PHYSICAL EXAM: BP 115/76 (BP Location: Left Arm, Patient Position: Sitting, Cuff Size: Normal)   Pulse (!) 102   Temp 98.6 F (37 C) (Oral)   Resp 18   Ht 5\' 9"  (1.753 m)   Wt 134 lb (60.8 kg)   SpO2 95%   BMI 19.79 kg/m   Wt Readings from Last 3 Encounters:  07/07/18 134 lb (60.8 kg)  11/21/17 135 lb 3.2 oz (61.3 kg)  09/12/17 135 lb 3.2 oz (61.3 kg)    Physical Exam General appearance - alert, well appearing, and in no distress Mental status - normal mood, behavior, speech, dress, motor activity, and thought processes Mouth - mucous membranes moist, pharynx normal without lesions Neck  - supple, no significant adenopathy Chest - clear to auscultation, no wheezes, rales or rhonchi, symmetric air entry Heart - normal rate, regular rhythm, normal S1, S2, no murmurs, rubs, clicks or gallops Extremities - peripheral pulses normal, no pedal edema, no clubbing or cyanosis Neurologic:  LEAP with decreased sensation on the ball of both feet CMP Latest Ref Rng & Units 11/21/2017 09/12/2017 06/16/2016  Glucose 65 - 99 mg/dL - 84 956(O105(H)  BUN 6 - 24 mg/dL - 6 <1(H<5(L)  Creatinine 0.860.76 - 1.27 mg/dL - 5.78(I0.74(L) 6.960.69  Sodium 134 - 144 mmol/L - 142 137  Potassium 3.5 - 5.2 mmol/L - 4.7 3.9  Chloride 96 - 106 mmol/L - 99 105  CO2 20 - 29 mmol/L - 25 21(L)  Calcium 8.7 - 10.2 mg/dL - 9.8 9.0  Total Protein 6.0 - 8.5 g/dL 7.9 8.5 7.7  Total Bilirubin 0.0 - 1.2 mg/dL <2.9<0.2 0.3 0.3  Alkaline Phos 39 - 117 IU/L 55 77 64  AST 0 - 40 IU/L 38 87(H) 102(H)  ALT 0 - 44 IU/L 29 45(H) 46   Lipid Panel     Component Value Date/Time   CHOL 183 04/03/2017 0949   TRIG 50 04/03/2017 0949   HDL 106 04/03/2017 0949   CHOLHDL 1.7 04/03/2017 0949   LDLCALC 67 04/03/2017 0949    CBC    Component Value Date/Time   WBC 5.9 09/12/2017 1527   WBC 4.9 06/16/2016 1715   RBC 3.84 (L) 09/12/2017 1527   RBC 4.12 (L) 06/16/2016 1715   HGB 13.6 09/12/2017 1527   HCT 39.3 09/12/2017 1527   PLT 175 09/12/2017 1527   MCV 102 (H) 09/12/2017 1527   MCH 35.4 (H) 09/12/2017 1527   MCH 34.0 06/16/2016 1715   MCHC 34.6 09/12/2017 1527   MCHC 33.3 06/16/2016 1715   RDW 13.2 09/12/2017 1527   LYMPHSABS 2.3 06/16/2016 1715   MONOABS 0.3 06/16/2016 1715   EOSABS 0.3 06/16/2016 1715   BASOSABS 0.1 06/16/2016 1715    ASSESSMENT AND PLAN: 1. Essential hypertension At goal.  Continue amlodipine - Comprehensive metabolic panel  2. Tobacco abuse Patient advised to quit smoking. Discussed health risks associated with smoking including lung and other types of cancers, chronic lung diseases and CV risks.. Pt not ready to  give trail of quitting.  Discussed methods to help quit including quitting cold [place], use of NRT, Chantix and Bupropion.   3. ETOH abuse Advised to cut down with the goal of quitting.  Patient is not ready to do so - Comprehensive metabolic panel  4. Numbness in feet - Vitamin B12 - Folate  5. Screening for colon cancer - Fecal occult blood, imunochemical([facility])  Patient was given the opportunity to ask questions.  Patient verbalized understanding of the plan and was able to repeat key elements of the plan.   Addendum:  Abnormal LFTs.  Likely due to ETOH on going use.  Will add hep C and B screen as future lab.  Orders Placed This Encounter  Procedures  . Fecal occult blood, imunochemical([facility])  . Vitamin B12  . Folate  . Comprehensive metabolic panel     Requested Prescriptions    No prescriptions requested or ordered in this encounter    Return in about 3 months (around 10/07/2018).  Jonah Blueeborah Johnson, MD, FACP

## 2018-07-08 LAB — COMPREHENSIVE METABOLIC PANEL
ALT: 64 IU/L — ABNORMAL HIGH (ref 0–44)
AST: 150 IU/L — ABNORMAL HIGH (ref 0–40)
Albumin/Globulin Ratio: 1.6 (ref 1.2–2.2)
Albumin: 4.6 g/dL (ref 3.8–4.9)
Alkaline Phosphatase: 80 IU/L (ref 39–117)
BUN/Creatinine Ratio: 9 (ref 9–20)
BUN: 6 mg/dL (ref 6–24)
Bilirubin Total: 0.3 mg/dL (ref 0.0–1.2)
CO2: 18 mmol/L — ABNORMAL LOW (ref 20–29)
Calcium: 9 mg/dL (ref 8.7–10.2)
Chloride: 105 mmol/L (ref 96–106)
Creatinine, Ser: 0.65 mg/dL — ABNORMAL LOW (ref 0.76–1.27)
GFR calc Af Amer: 124 mL/min/{1.73_m2} (ref >59)
GFR calc non Af Amer: 107 mL/min/{1.73_m2} (ref >59)
Globulin, Total: 2.8 g/dL (ref 1.5–4.5)
Glucose: 81 mg/dL (ref 65–99)
Potassium: 4.1 mmol/L (ref 3.5–5.2)
Sodium: 140 mmol/L (ref 134–144)
Total Protein: 7.4 g/dL (ref 6.0–8.5)

## 2018-07-08 LAB — FOLATE: Folate: 8.3 ng/mL (ref >3.0)

## 2018-07-08 LAB — VITAMIN B12: Vitamin B-12: 625 pg/mL (ref 232–1245)

## 2018-07-09 NOTE — Addendum Note (Signed)
Addended by: Karle Plumber B on: 07/09/2018 12:07 PM   Modules accepted: Orders

## 2018-07-16 ENCOUNTER — Other Ambulatory Visit: Payer: Self-pay

## 2018-07-16 ENCOUNTER — Ambulatory Visit: Payer: Self-pay | Attending: Family Medicine

## 2018-07-16 DIAGNOSIS — R7989 Other specified abnormal findings of blood chemistry: Secondary | ICD-10-CM

## 2018-07-16 DIAGNOSIS — R945 Abnormal results of liver function studies: Secondary | ICD-10-CM

## 2018-07-17 LAB — HEPATITIS C ANTIBODY: Hep C Virus Ab: <0.1 s/co ratio (ref 0.0–0.9)

## 2018-07-17 LAB — HEPATITIS B SURFACE ANTIGEN: Hepatitis B Surface Ag: NEGATIVE

## 2018-07-18 ENCOUNTER — Telehealth: Payer: Self-pay | Admitting: *Deleted

## 2018-07-18 NOTE — Telephone Encounter (Signed)
UTR patient or LVM. Patients Hepatitis are negative for B and C.

## 2018-07-18 NOTE — Telephone Encounter (Signed)
-----   Message from Ladell Pier, MD sent at 07/17/2018 10:14 PM EDT ----- Let pt know that screen for hepatitis B and C were negative.

## 2018-07-30 ENCOUNTER — Telehealth: Payer: Self-pay | Admitting: Internal Medicine

## 2018-07-30 NOTE — Telephone Encounter (Signed)
Pt's brother called to request lab results from 07/07/2018. Please follow up as soon as possible.

## 2018-08-01 NOTE — Telephone Encounter (Signed)
Please share results with patient

## 2018-08-01 NOTE — Telephone Encounter (Signed)
Contacted pt to go over lab results pt didn't answer was unable to lvm duw to vm being full

## 2018-10-06 ENCOUNTER — Encounter: Payer: Self-pay | Admitting: Internal Medicine

## 2018-10-06 ENCOUNTER — Other Ambulatory Visit: Payer: Self-pay

## 2018-10-06 ENCOUNTER — Ambulatory Visit: Payer: Medicaid Other | Attending: Internal Medicine | Admitting: Internal Medicine

## 2018-10-06 VITALS — BP 122/85 | HR 104 | Temp 98.4°F | Resp 18 | Ht 69.0 in

## 2018-10-06 DIAGNOSIS — Z720 Tobacco use: Secondary | ICD-10-CM | POA: Diagnosis not present

## 2018-10-06 DIAGNOSIS — R159 Full incontinence of feces: Secondary | ICD-10-CM | POA: Diagnosis not present

## 2018-10-06 DIAGNOSIS — J449 Chronic obstructive pulmonary disease, unspecified: Secondary | ICD-10-CM | POA: Diagnosis not present

## 2018-10-06 DIAGNOSIS — Z79899 Other long term (current) drug therapy: Secondary | ICD-10-CM | POA: Insufficient documentation

## 2018-10-06 DIAGNOSIS — F17210 Nicotine dependence, cigarettes, uncomplicated: Secondary | ICD-10-CM | POA: Insufficient documentation

## 2018-10-06 DIAGNOSIS — I10 Essential (primary) hypertension: Secondary | ICD-10-CM | POA: Diagnosis present

## 2018-10-06 DIAGNOSIS — F1010 Alcohol abuse, uncomplicated: Secondary | ICD-10-CM | POA: Diagnosis not present

## 2018-10-06 DIAGNOSIS — R197 Diarrhea, unspecified: Secondary | ICD-10-CM

## 2018-10-06 DIAGNOSIS — Z1211 Encounter for screening for malignant neoplasm of colon: Secondary | ICD-10-CM | POA: Insufficient documentation

## 2018-10-06 DIAGNOSIS — Z8249 Family history of ischemic heart disease and other diseases of the circulatory system: Secondary | ICD-10-CM | POA: Insufficient documentation

## 2018-10-06 DIAGNOSIS — Z23 Encounter for immunization: Secondary | ICD-10-CM | POA: Diagnosis not present

## 2018-10-06 MED ORDER — LOPERAMIDE HCL 2 MG PO TABS: 2.0000 mg | ORAL_TABLET | Freq: Two times a day (BID) | ORAL | 1 refills | Status: DC | PRN

## 2018-10-06 MED ORDER — HYDROXYZINE PAMOATE 25 MG PO CAPS: 25.0000 mg | ORAL_CAPSULE | Freq: Every day | ORAL | 1 refills | Status: DC

## 2018-10-06 MED FILL — AMLODIPINE BESYLATE 10 MG T: 10 | 30 days supply | Qty: 30 | Fill #4

## 2018-10-06 MED FILL — $BREO ELLIPTA 100-25 MCG IH: 100-25 MCG | 60 days supply | Qty: 120 | Fill #2

## 2018-10-06 NOTE — Patient Instructions (Signed)
Please give patient an appointment with [facility] later this week or early next week.

## 2018-10-06 NOTE — Progress Notes (Signed)
Patient ID: Brent Warren, male    DOB: Apr 20, 1960  MRN: [id number]  CC: Follow-up   Subjective: Brent Warren is a 58 y.o. male who presents for chronic ds management.  Brother [name] Shartzer is with him His concerns today include:  Patient with history ofhtn, copd, tob and etoh abuse, hx of Right empyema sp VATS 5/17, with findings of multiple lung nodules on CT scan 9/17.  HM:  Need for flu shot, he has not turned in FIT test.  HTN: Takes Norvasc but takes a half of the 10 mg instead of the full pill.  States he feels weird when he takes the full pill and drinks.  He admits that he is still drinking alcohol almost daily.  He limits salt in the food as much as he is able but he states "I eat what I can."    ETOH abuse:  Drinks two 40 oz a day.  Went into rehab before. Had quit 1 time before for 8 yrs. he and his brother have talked about him trying a rehab again.  Patient states he has made up his mind to go but is concerned about catching infection from homeless people who may be in rehab.   Tob dep:  Still smoking.  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  He has not made up his mind to quit.  Brother reports pt has intermittent loose  diarrhea x 2-3 mths.  Patient states "when I eat good the stool is solid."  When he he neglects his nutrition due to alcohol consumption, the stools are loose.  No stomach pains. No recent abx no fever.  No blood in the stools. Brother reports that he many times is not able to make it to the bathroom and becomes incontinent which messes up his carpet.  He is wanting to know if patient would qualify for incontinence supplies  COPD:  Not using Breo daily.  He uses both Breo and albuterol as needed  Patient Active Problem List   Diagnosis Date Noted  . Chest pain in adult 04/02/2017  . Immunization due 11/06/2016  . Pulmonary emphysema (HCC) 11/06/2016  . Urge incontinence 11/06/2016   . Tobacco abuse 11/06/2016  . ETOH abuse 11/06/2016  . Lung nodule, multiple 11/08/2015  . HTN (hypertension) 08/01/2015  . Malnutrition of moderate degree 06/02/2015  . Hepatitis 05/22/2015     Current Outpatient Medications on File Prior to Visit  Medication Sig Dispense Refill  . albuterol (PROVENTIL HFA;VENTOLIN HFA) 108 (90 Base) MCG/ACT inhaler Inhale 2 puffs into the lungs every 6 (six) hours as needed for wheezing or shortness of breath. 1 Inhaler 2  . albuterol (PROVENTIL) (2.5 MG/3ML) 0.083% nebulizer solution Take 3 mLs (2.5 mg total) by nebulization every 6 (six) hours as needed for wheezing or shortness of breath. 150 mL 3  . amLODipine (NORVASC) 10 MG tablet Take 1 tablet (10 mg total) by mouth daily. MUST MAKE APPT FOR FURTHER REFILLS 30 tablet 5  . fluticasone furoate-vilanterol (BREO ELLIPTA) 100-25 MCG/INH AEPB Inhale 1 puff into the lungs daily. 1 each 5  . nitroGLYCERIN (NITROSTAT) 0.4 MG SL tablet Place 1 tablet (0.4 mg total) under the tongue every 5 (five) minutes as needed for chest pain. 25 tablet 11  . oxybutynin (DITROPAN-XL) 5 MG 24 hr tablet Take 1 tablet (5 mg total) by mouth at bedtime. (Patient not taking: Reported on 07/07/2018) 30 tablet 3  No current facility-administered medications on file prior to visit.     No Known Allergies  Social History   Socioeconomic History  . Marital status: Single    Spouse name: Not on file  . Number of children: Not on file  . Years of education: Not on file  . Highest education level: Not on file  Occupational History  . Not on file  Social Needs  . Financial resource strain: Not on file  . Food insecurity    Worry: Not on file    Inability: Not on file  . Transportation needs    Medical: Not on file    Non-medical: Not on file  Tobacco Use  . Smoking status: Current Every Day Smoker    Packs/day: 1.00    Years: 47.00    Pack years: 47.00    Types: Cigarettes    Start date: 07/08/2015  . Smokeless  tobacco: Never Used  . Tobacco comment: a pack in a couple of days  Substance and Sexual Activity  . Alcohol use: Yes    Alcohol/week: 2.0 standard drinks    Types: 2 Cans of beer per week    Comment: daily  . Drug use: No  . Sexual activity: Not Currently  Lifestyle  . Physical activity    Days per week: Not on file    Minutes per session: Not on file  . Stress: Not on file  Relationships  . Social Herbalist on phone: Not on file    Gets together: Not on file    Attends religious service: Not on file    Active member of club or organization: Not on file    Attends meetings of clubs or organizations: Not on file    Relationship status: Not on file  . Intimate partner violence    Fear of current or ex partner: Not on file    Emotionally abused: Not on file    Physically abused: Not on file    Forced sexual activity: Not on file  Other Topics Concern  . Not on file  Social History Narrative  . Not on file    Family History  Problem Relation Age of Onset  . Heart attack Mother   . Cirrhosis Father   . Stroke Sister   . Heart attack Brother     Past Surgical History:  Procedure Laterality Date  . arm surgery    . DECORTICATION Right 06/03/2015   Procedure: DECORTICATION;  Surgeon: Melrose Nakayama, MD;  Location: [facility];  Service: Thoracic;  Laterality: Right;  Marland Kitchen VIDEO ASSISTED THORACOSCOPY (VATS)/EMPYEMA Right 06/03/2015   Procedure: VIDEO ASSISTED THORACOSCOPY (VATS)/EMPYEMA;  Surgeon: Melrose Nakayama, MD;  Location: [facility];  Service: Thoracic;  Laterality: Right;    ROS: Review of Systems Negative except as stated above  PHYSICAL EXAM: BP 122/85 (BP Location: Left Arm, Patient Position: Sitting, Cuff Size: Normal)   Pulse (!) 104   Temp 98.4 F (36.9 C) (Oral)   Resp 18   Ht 5\' 9"  (1.753 m)   SpO2 98%   BMI 19.79 kg/m   Wt Readings from Last 3 Encounters:  07/07/18 134 lb (60.8 kg)  11/21/17 135 lb 3.2 oz (61.3 kg)  09/12/17 135 lb 3.2  oz (61.3 kg)    Physical Exam General appearance - alert, well appearing, and in no distress Mental status - normal mood, behavior, speech, dress, motor activity, and thought processes Mouth - mucous membranes moist, pharynx normal without  lesions Neck - supple, no significant adenopathy Chest - clear to auscultation, no wheezes, rales or rhonchi, symmetric air entry Heart - normal rate, regular rhythm, normal S1, S2, no murmurs, rubs, clicks or gallops Extremities - peripheral pulses normal, no pedal edema, no clubbing or cyanosis  CMP Latest Ref Rng & Units 07/07/2018 11/21/2017 09/12/2017  Glucose 65 - 99 mg/dL 81 - 84  BUN 6 - 24 mg/dL 6 - 6  Creatinine 1.610.76 - 1.27 mg/dL 0.96(E0.65(L) - 4.54(U0.74(L)  Sodium 134 - 144 mmol/L 140 - 142  Potassium 3.5 - 5.2 mmol/L 4.1 - 4.7  Chloride 96 - 106 mmol/L 105 - 99  CO2 20 - 29 mmol/L 18(L) - 25  Calcium 8.7 - 10.2 mg/dL 9.0 - 9.8  Total Protein 6.0 - 8.5 g/dL 7.4 7.9 8.5  Total Bilirubin 0.0 - 1.2 mg/dL 0.3 <9.8<0.2 0.3  Alkaline Phos 39 - 117 IU/L 80 55 77  AST 0 - 40 IU/L 150(H) 38 87(H)  ALT 0 - 44 IU/L 64(H) 29 45(H)   Lipid Panel     Component Value Date/Time   CHOL 183 04/03/2017 0949   TRIG 50 04/03/2017 0949   HDL 106 04/03/2017 0949   CHOLHDL 1.7 04/03/2017 0949   LDLCALC 67 04/03/2017 0949    CBC    Component Value Date/Time   WBC 5.9 09/12/2017 1527   WBC 4.9 06/16/2016 1715   RBC 3.84 (L) 09/12/2017 1527   RBC 4.12 (L) 06/16/2016 1715   HGB 13.6 09/12/2017 1527   HCT 39.3 09/12/2017 1527   PLT 175 09/12/2017 1527   MCV 102 (H) 09/12/2017 1527   MCH 35.4 (H) 09/12/2017 1527   MCH 34.0 06/16/2016 1715   MCHC 34.6 09/12/2017 1527   MCHC 33.3 06/16/2016 1715   RDW 13.2 09/12/2017 1527   LYMPHSABS 2.3 06/16/2016 1715   MONOABS 0.3 06/16/2016 1715   EOSABS 0.3 06/16/2016 1715   BASOSABS 0.1 06/16/2016 1715    ASSESSMENT AND PLAN: 1. Essential hypertension Close to goal.  I recommend that he cut the 10 mg amlodipine in half  and take half in the morning and half in the evening.  2. Tobacco abuse Advised to quit.  Patient not ready to give a trial of quitting.  Less than 5 minutes spent on counseling.  3. ETOH abuse Strongly advised patient to discontinue drinking.  Discussed negative impact on health.  We have scheduled an appointment for him to see the LCSW to get information about long-term rehab programs  4. Functional fecal incontinence Will write prescription for incontinence supplies for him  5. Screening for colon cancer - Ambulatory referral to Gastroenterology  6. Need for immunization against influenza Given  7. Diarrhea, unspecified type - Ambulatory referral to Gastroenterology Imodium as needed - Cdiff NAA+O+P+Stool Culture    Patient was given the opportunity to ask questions.  Patient verbalized understanding of the plan and was able to repeat key elements of the plan.   Orders Placed This Encounter  Procedures  . Cdiff NAA+O+P+Stool Culture  . Flu Vaccine QUAD 6+ mos PF IM (Fluarix Quad PF)  . Ambulatory referral to Gastroenterology     Requested Prescriptions   Signed Prescriptions Disp Refills  . loperamide (IMODIUM A-D) 2 MG tablet 30 tablet 1    Sig: Take 1 tablet (2 mg total) by mouth 2 (two) times daily as needed for diarrhea or loose stools.    Return in about 4 months (around 02/05/2019).  Jonah Blueeborah Alani Sabbagh, MD, FACP

## 2018-10-07 ENCOUNTER — Ambulatory Visit: Payer: Self-pay | Admitting: Internal Medicine

## 2018-10-08 ENCOUNTER — Other Ambulatory Visit: Payer: Self-pay

## 2018-10-08 ENCOUNTER — Ambulatory Visit: Payer: Medicaid Other | Attending: Internal Medicine | Admitting: Licensed Clinical Social Worker

## 2018-10-08 DIAGNOSIS — F1010 Alcohol abuse, uncomplicated: Secondary | ICD-10-CM

## 2018-10-09 ENCOUNTER — Telehealth: Payer: Self-pay

## 2018-10-09 ENCOUNTER — Telehealth: Payer: Self-pay | Admitting: Licensed Clinical Social Worker

## 2018-10-09 NOTE — Telephone Encounter (Signed)
Call placed to patient. LCSW informed pt and brother, Brent Warren, of requested detox and substance use inpatient treatment resources. Family was also provided [facility] number for additional tx resources.   No additional concerns noted.

## 2018-10-09 NOTE — Telephone Encounter (Signed)
Order for depends faxed to [facility]

## 2018-10-10 NOTE — Progress Notes (Signed)
Integrated Behavioral Health Visit via Telemedicine (Telephone)  10/08/2018 Brent Warren [id number]   Session Start time: 3:50 PM  Session End time: 4:10 PM Total time: 20 minutes  Referring Provider: Dr. Wynetta Emery Type of Visit: Telephonic Patient location: Home [facility] Provider location: Office All persons participating in visit: LCSW, pt, pt's brother [name]  Confirmed patient's address: Yes  Confirmed patient's phone number: Yes  Any changes to demographics: No   Confirmed patient's insurance: Yes  Any changes to patient's insurance: No   Discussed confidentiality: Yes    The following statements were read to the patient and/or legal guardian that are established with the [facility] Provider.  "The purpose of this phone visit is to provide behavioral health care while limiting exposure to the coronavirus (COVID19).  There is a possibility of technology failure and discussed alternative modes of communication if that failure occurs."  "By engaging in this telephone visit, you consent to the provision of healthcare.  Additionally, you authorize for your insurance to be billed for the services provided during this telephone visit."   Patient and/or legal guardian consented to telephone visit: Yes   PRESENTING CONCERNS: Patient and/or family reports the following symptoms/concerns: Pt reports difficulty managing high blood pressure, difficulty sleeping, and irritability. He shares hx of substance use (alcohol) and would like to participate in treatment. States that he quit "cold [place]" in 1991 for approximately 8 years; however, currently drinks two 40 pz beers daily, in addition, to cigarette use.   LCSW utilized motivational interviewing to assess pt's stage of change. Pt receives strong support from brother, with whom he resides. Pt is interested in receiving detox and inpatient treatment in [place] only.   Duration of problem: Ongoing; Severity of problem:  severe  STRENGTHS (Protective Factors/Coping Skills): Pt receives strong support from family Pt is insured  Pt identified motivation agents to assist with achieving goals  GOALS ADDRESSED: Patient will: 1.  Reduce symptoms of: substance use  2.  Increase knowledge and/or ability of: self-management skills  3.  Demonstrate ability to: Decrease self-medicating behaviors  INTERVENTIONS: Interventions utilized:  Motivational Interviewing and Psychoeducation and/or Health Education Standardized Assessments completed: Not Needed  ASSESSMENT: Patient currently experiencing symptoms of anxiety and depression triggered by current substance use.    Patient may benefit from substance use treatment. LCSW discussed cycle of substance use and commended pt for goal of becoming sober to better manage health. He receives strong support from family. Pt will be contacted with local resources for detox and inpatient treatment in [place]: 1. Follow up with behavioral health clinician on : Contact clinic with any additional concerns/questions 2. Behavioral recommendations: Follow up with resources provided 3. Referral(s): Substance Abuse Program  Rebekah Chesterfield, [place] 10/10/2018 11:58 AM

## 2018-12-15 DIAGNOSIS — R32 Unspecified urinary incontinence: Secondary | ICD-10-CM | POA: Diagnosis not present

## 2019-01-16 DIAGNOSIS — R32 Unspecified urinary incontinence: Secondary | ICD-10-CM | POA: Diagnosis not present

## 2019-02-05 ENCOUNTER — Other Ambulatory Visit: Payer: Self-pay

## 2019-02-05 ENCOUNTER — Other Ambulatory Visit: Payer: Self-pay | Admitting: Internal Medicine

## 2019-02-05 ENCOUNTER — Ambulatory Visit: Payer: Medicaid Other | Attending: Internal Medicine | Admitting: Internal Medicine

## 2019-02-05 ENCOUNTER — Encounter: Payer: Self-pay | Admitting: Internal Medicine

## 2019-02-05 DIAGNOSIS — I10 Essential (primary) hypertension: Secondary | ICD-10-CM

## 2019-02-05 DIAGNOSIS — Z720 Tobacco use: Secondary | ICD-10-CM

## 2019-02-05 DIAGNOSIS — F17210 Nicotine dependence, cigarettes, uncomplicated: Secondary | ICD-10-CM | POA: Diagnosis not present

## 2019-02-05 DIAGNOSIS — M130 Polyarthritis, unspecified: Secondary | ICD-10-CM | POA: Diagnosis not present

## 2019-02-05 DIAGNOSIS — F17200 Nicotine dependence, unspecified, uncomplicated: Secondary | ICD-10-CM | POA: Insufficient documentation

## 2019-02-05 DIAGNOSIS — Z791 Long term (current) use of non-steroidal anti-inflammatories (NSAID): Secondary | ICD-10-CM | POA: Diagnosis not present

## 2019-02-05 DIAGNOSIS — Z79899 Other long term (current) drug therapy: Secondary | ICD-10-CM | POA: Insufficient documentation

## 2019-02-05 DIAGNOSIS — Z7951 Long term (current) use of inhaled steroids: Secondary | ICD-10-CM | POA: Diagnosis not present

## 2019-02-05 DIAGNOSIS — Z125 Encounter for screening for malignant neoplasm of prostate: Secondary | ICD-10-CM

## 2019-02-05 DIAGNOSIS — J439 Emphysema, unspecified: Secondary | ICD-10-CM | POA: Diagnosis not present

## 2019-02-05 DIAGNOSIS — F1010 Alcohol abuse, uncomplicated: Secondary | ICD-10-CM

## 2019-02-05 MED ORDER — BREO ELLIPTA 100-25 MCG/INH IN AEPB: 1.0000 | INHALATION_SPRAY | Freq: Every day | RESPIRATORY_TRACT | 5 refills | Status: DC

## 2019-02-05 MED ORDER — ALBUTEROL SULFATE (2.5 MG/3ML) 0.083% IN NEBU: 2.5000 mg | INHALATION_SOLUTION | Freq: Four times a day (QID) | RESPIRATORY_TRACT | 3 refills | Status: DC | PRN

## 2019-02-05 MED ORDER — ALBUTEROL SULFATE HFA 108 (90 BASE) MCG/ACT IN AERS: 2.0000 | INHALATION_SPRAY | Freq: Four times a day (QID) | RESPIRATORY_TRACT | 6 refills | Status: DC | PRN

## 2019-02-05 MED ORDER — AMLODIPINE BESYLATE 10 MG PO TABS: ORAL_TABLET | ORAL | 6 refills | Status: DC

## 2019-02-05 MED ORDER — MELOXICAM 15 MG PO TABS: 15.0000 mg | ORAL_TABLET | Freq: Every day | ORAL | 3 refills | Status: DC

## 2019-02-05 MED FILL — PROAIR HFA 90 MCG INHALER: 108 (90 BAS | 25 days supply | Qty: 9 | Fill #0

## 2019-02-05 MED FILL — ALBUTEROL SUL 2.5 MG/3 ML S: (2.5 MG/3ML | 12 days supply | Qty: 150 | Fill #0

## 2019-02-05 MED FILL — MELOXICAM 15 MG TABLET: 15 | 90 days supply | Qty: 90 | Fill #0

## 2019-02-05 MED FILL — AMLODIPINE BESYLATE 10 MG T: 10 | 90 days supply | Qty: 90 | Fill #0

## 2019-02-05 NOTE — Progress Notes (Signed)
Pt is requesting something for his arthritis

## 2019-02-05 NOTE — Progress Notes (Signed)
Virtual Visit via Telephone Note Due to current restrictions/limitations of in-office visits due to the COVID-19 pandemic, this scheduled clinical appointment was converted to a telehealth visit  I connected with Brent Warren on 02/05/19 at 12:15 p.m by telephone and verified that I am speaking with the correct person using two identifiers. I am in my office.  The patient is at home.  Only the patient, his brother [name] and myself participated in this encounter.  I discussed the limitations, risks, security and privacy concerns of performing an evaluation and management service by telephone and the availability of in person appointments. I also discussed with the patient that there may be a patient responsible charge related to this service. The patient expressed understanding and agreed to proceed.   History of Present Illness: Patient with history ofhtn, copd, tob and etoh abuse, hx of Right empyema sp VATS 5/17, with findings of multiple lung nodules on CT scan (gets yr LDCT).  Last seen 09/2018.  Purpose of today's visit is chronic disease management.  ETOH abuse: thought about going to hosp for detox then to rehab but afraid to go during COVID pandemic. -reports he has slowed down to "a couple of 40's a day." -still smoking 1/2 pk a day. Got free patches and losengers from 1-800-QUIT NOW but has not started using them as yet.  "I ought to."   COPD:  Using Breo once a day.  Uses Proventil once a day sometimes not at all coughs up mucus first thing in mornings only.  His brother had some questions about the difference between the 2 inhalers.  He wanted to know which one the patient should keep with him at all times.  Feels he has arthritis in knees, fingers and back. +stiffness.  No swelling.  Requesting something for pain.  HTN:  Needing RF on Norvasc Outpatient Encounter Medications as of 02/05/2019  Medication Sig  . albuterol (PROVENTIL HFA;VENTOLIN HFA) 108 (90 Base) MCG/ACT inhaler  Inhale 2 puffs into the lungs every 6 (six) hours as needed for wheezing or shortness of breath.  Marland Kitchen albuterol (PROVENTIL) (2.5 MG/3ML) 0.083% nebulizer solution Take 3 mLs (2.5 mg total) by nebulization every 6 (six) hours as needed for wheezing or shortness of breath.  Marland Kitchen amLODipine (NORVASC) 10 MG tablet Take 1 tablet (10 mg total) by mouth daily. MUST MAKE APPT FOR FURTHER REFILLS  . fluticasone furoate-vilanterol (BREO ELLIPTA) 100-25 MCG/INH AEPB Inhale 1 puff into the lungs daily.  Marland Kitchen loperamide (IMODIUM A-D) 2 MG tablet Take 1 tablet (2 mg total) by mouth 2 (two) times daily as needed for diarrhea or loose stools.  . nitroGLYCERIN (NITROSTAT) 0.4 MG SL tablet Place 1 tablet (0.4 mg total) under the tongue every 5 (five) minutes as needed for chest pain.  Marland Kitchen oxybutynin (DITROPAN-XL) 5 MG 24 hr tablet Take 1 tablet (5 mg total) by mouth at bedtime. (Patient not taking: Reported on 07/07/2018)   No facility-administered encounter medications on file as of 02/05/2019.    Observations/Objective: No direct observation done as this is a telephone encounter.  Assessment and Plan: 1. ETOH abuse -Continue to advise and encouraged him to quit.  Commended him on cutting back but I have told him that he is still drinking too much.  2. Pulmonary emphysema, unspecified emphysema type (HCC) I went over with the patient and his brother that the Ventolin as a rescue inhaler that he should keep with him at all times to use as needed.  However it sounds as  though he is doing good on the Breo Strongly advised to quit smoking - albuterol (VENTOLIN HFA) 108 (90 Base) MCG/ACT inhaler; Inhale 2 puffs into the lungs every 6 (six) hours as needed for wheezing or shortness of breath.  Dispense: 8 g; Refill: 6 - albuterol (PROVENTIL) (2.5 MG/3ML) 0.083% nebulizer solution; Take 3 mLs (2.5 mg total) by nebulization every 6 (six) hours as needed for wheezing or shortness of breath.  Dispense: 150 mL; Refill: 3 -  fluticasone furoate-vilanterol (BREO ELLIPTA) 100-25 MCG/INH AEPB; Inhale 1 puff into the lungs daily.  Dispense: 1 each; Refill: 5  3. Tobacco abuse Advised to quit.  He now has free nicotine patches and lozenges but does not made up in his mind to give a trial of quitting.  He is aware of the health risks associated with smoking and that it can make his COPD worse.  Less than 5 minutes spent on counseling  4. Essential hypertension Refill amlodipine - CBC - Lipid panel - Comprehensive metabolic panel - amLODipine (NORVASC) 10 MG tablet; 1/2 tab PO BID  Dispense: 90 tablet; Refill: 6  5. Polyarthritis We will give a trial of meloxicam - meloxicam (MOBIC) 15 MG tablet; Take 1 tablet (15 mg total) by mouth daily.  Dispense: 90 tablet; Refill: 3  6. Prostate cancer screening - PSA   Follow Up Instructions: 4 mths   I discussed the assessment and treatment plan with the patient. The patient was provided an opportunity to ask questions and all were answered. The patient agreed with the plan and demonstrated an understanding of the instructions.   The patient was advised to call back or seek an in-person evaluation if the symptoms worsen or if the condition fails to improve as anticipated.  I provided 17 minutes of non-face-to-face time during this encounter.   Karle Plumber, MD

## 2019-02-10 ENCOUNTER — Telehealth: Payer: Self-pay

## 2019-02-10 DIAGNOSIS — J439 Emphysema, unspecified: Secondary | ICD-10-CM

## 2019-02-10 NOTE — Telephone Encounter (Signed)
Pt now has Medicaid and Advair, Dulera or Symbicort is preferred.  If appropriate, can we have a new script for one of these meds.

## 2019-02-11 MED ORDER — BUDESONIDE-FORMOTEROL FUMARATE 80-4.5 MCG/ACT IN AERO: 2.0000 | INHALATION_SPRAY | Freq: Two times a day (BID) | RESPIRATORY_TRACT | 5 refills | Status: DC

## 2019-02-11 MED FILL — SYMBICORT 80-4.5 MCG INH: 80-4.5 | 30 days supply | Qty: 10 | Fill #0

## 2019-02-11 NOTE — Addendum Note (Signed)
Addended by: Jonah Blue B on: 02/11/2019 10:05 AM   Modules accepted: Orders

## 2019-02-23 DIAGNOSIS — R32 Unspecified urinary incontinence: Secondary | ICD-10-CM | POA: Diagnosis not present

## 2019-06-01 ENCOUNTER — Ambulatory Visit: Payer: Medicaid Other | Attending: Internal Medicine

## 2019-06-01 DIAGNOSIS — Z23 Encounter for immunization: Secondary | ICD-10-CM

## 2019-06-01 NOTE — Progress Notes (Signed)
   Covid-19 Vaccination Clinic  Name:  Brent Warren    MRN: [id number] DOB: 10-29-1960  06/01/2019  Mr. [name] was observed post Covid-19 immunization for 15 minutes without incident. He was provided with Vaccine Information Sheet and instruction to access the V-Safe system.   Mr. [name] was instructed to call 911 with any severe reactions post vaccine: Marland Kitchen Difficulty breathing  . Swelling of face and throat  . A fast heartbeat  . A bad rash all over body  . Dizziness and weakness   Immunizations Administered    Name Date Dose VIS Date Route   Pfizer COVID-19 Vaccine 06/01/2019 11:54 AM 0.3 mL 03/04/2018 Intramuscular   Manufacturer: [employer], [employer]   Lot: [id number]   NDC: [address]-9

## 2019-06-22 ENCOUNTER — Ambulatory Visit: Payer: Medicaid Other | Attending: Internal Medicine

## 2019-06-22 DIAGNOSIS — Z23 Encounter for immunization: Secondary | ICD-10-CM

## 2019-06-22 NOTE — Progress Notes (Signed)
   Covid-19 Vaccination Clinic  Name:  Brent Warren    MRN: [id number] DOB: [birth year]  06/22/2019  [name] was observed post Covid-19 immunization for 15 minutes without incident. He was provided with Vaccine Information Sheet and instruction to access the V-Safe system.   [name] was instructed to call 911 with any severe reactions post vaccine: Marland Kitchen Difficulty breathing  . Swelling of face and throat  . A fast heartbeat  . A bad rash all over body  . Dizziness and weakness   Immunizations Administered    Name Date Dose VIS Date Route   Pfizer COVID-19 Vaccine 06/22/2019 11:25 AM 0.3 mL 03/04/2018 Intramuscular   Manufacturer: [employer], [employer]   Lot: [id number]   NDC: [address]-3

## 2019-09-12 DIAGNOSIS — R32 Unspecified urinary incontinence: Secondary | ICD-10-CM | POA: Diagnosis not present

## 2019-09-21 ENCOUNTER — Telehealth: Payer: Self-pay | Admitting: Internal Medicine

## 2019-09-21 ENCOUNTER — Telehealth: Payer: Self-pay | Admitting: Acute Care

## 2019-09-21 ENCOUNTER — Other Ambulatory Visit: Payer: Self-pay | Admitting: *Deleted

## 2019-09-21 DIAGNOSIS — S2232XA Fracture of one rib, left side, initial encounter for closed fracture: Secondary | ICD-10-CM | POA: Diagnosis not present

## 2019-09-21 DIAGNOSIS — F17210 Nicotine dependence, cigarettes, uncomplicated: Secondary | ICD-10-CM

## 2019-09-21 NOTE — Telephone Encounter (Signed)
Spoke with pt's brother and explained that pt can get his yearly low dose ct done through the free grant. I mailed the information along with the blue card that shows pt is there for the free screening CT and advised pt's brother that he has to take the card with him to CT appt. He verbalized understanding. Nothing further needed at this time.

## 2019-09-24 MED FILL — MELOXICAM 15 MG TABLET: 15 | 21 days supply | Qty: 21 | Fill #0

## 2019-09-30 MED FILL — PROAIR HFA 90 MCG INHALER: 108 (90 BAS | 25 days supply | Qty: 9 | Fill #1

## 2019-09-30 MED FILL — SYMBICORT 80-4.5 MCG INH: 80-4.5 | 30 days supply | Qty: 10 | Fill #1

## 2019-09-30 MED FILL — AMLODIPINE BESYLATE 10 MG T: 10 | 90 days supply | Qty: 90 | Fill #1

## 2019-10-02 MED FILL — CYCLOBENZAPRINE 5 MG TABLET: 5 | 7 days supply | Qty: 45 | Fill #0

## 2019-10-19 DIAGNOSIS — R32 Unspecified urinary incontinence: Secondary | ICD-10-CM | POA: Diagnosis not present

## 2019-10-26 ENCOUNTER — Encounter: Payer: Self-pay | Admitting: Internal Medicine

## 2019-10-26 ENCOUNTER — Ambulatory Visit: Payer: Medicaid Other | Attending: Internal Medicine | Admitting: Internal Medicine

## 2019-10-26 ENCOUNTER — Other Ambulatory Visit: Payer: Self-pay

## 2019-10-26 VITALS — BP 157/110 | HR 94 | Resp 16 | Wt 130.8 lb

## 2019-10-26 DIAGNOSIS — Z125 Encounter for screening for malignant neoplasm of prostate: Secondary | ICD-10-CM | POA: Diagnosis not present

## 2019-10-26 DIAGNOSIS — Z23 Encounter for immunization: Secondary | ICD-10-CM | POA: Diagnosis not present

## 2019-10-26 DIAGNOSIS — T461X6A Underdosing of calcium-channel blockers, initial encounter: Secondary | ICD-10-CM | POA: Insufficient documentation

## 2019-10-26 DIAGNOSIS — I10 Essential (primary) hypertension: Secondary | ICD-10-CM | POA: Diagnosis not present

## 2019-10-26 DIAGNOSIS — Z7951 Long term (current) use of inhaled steroids: Secondary | ICD-10-CM | POA: Diagnosis not present

## 2019-10-26 DIAGNOSIS — R3981 Functional urinary incontinence: Secondary | ICD-10-CM

## 2019-10-26 DIAGNOSIS — R159 Full incontinence of feces: Secondary | ICD-10-CM

## 2019-10-26 DIAGNOSIS — F17210 Nicotine dependence, cigarettes, uncomplicated: Secondary | ICD-10-CM | POA: Insufficient documentation

## 2019-10-26 DIAGNOSIS — Z791 Long term (current) use of non-steroidal anti-inflammatories (NSAID): Secondary | ICD-10-CM | POA: Insufficient documentation

## 2019-10-26 DIAGNOSIS — J439 Emphysema, unspecified: Secondary | ICD-10-CM | POA: Diagnosis not present

## 2019-10-26 DIAGNOSIS — Z720 Tobacco use: Secondary | ICD-10-CM

## 2019-10-26 DIAGNOSIS — Z716 Tobacco abuse counseling: Secondary | ICD-10-CM | POA: Diagnosis not present

## 2019-10-26 DIAGNOSIS — F1010 Alcohol abuse, uncomplicated: Secondary | ICD-10-CM

## 2019-10-26 DIAGNOSIS — Z79899 Other long term (current) drug therapy: Secondary | ICD-10-CM | POA: Diagnosis not present

## 2019-10-26 DIAGNOSIS — Z9229 Personal history of other drug therapy: Secondary | ICD-10-CM

## 2019-10-26 NOTE — Patient Instructions (Signed)
Influenza Virus Vaccine injection (Fluarix) What is this medicine? INFLUENZA VIRUS VACCINE (in floo EN zuh VAHY ruhs vak SEEN) helps to reduce the risk of getting influenza also known as the flu. This medicine may be used for other purposes; ask your health care provider or pharmacist if you have questions. COMMON BRAND NAME(S): Fluarix, Fluzone What should I tell my health care provider before I take this medicine? They need to know if you have any of these conditions:  bleeding disorder like hemophilia  fever or infection  Guillain-Barre syndrome or other neurological problems  immune system problems  infection with the human immunodeficiency virus (HIV) or AIDS  low blood platelet counts  multiple sclerosis  an unusual or allergic reaction to influenza virus vaccine, eggs, chicken proteins, latex, gentamicin, other medicines, foods, dyes or preservatives  pregnant or trying to get pregnant  breast-feeding How should I use this medicine? This vaccine is for injection into a muscle. It is given by a health care professional. A copy of Vaccine Information Statements will be given before each vaccination. Read this sheet carefully each time. The sheet may change frequently. Talk to your pediatrician regarding the use of this medicine in children. Special care may be needed. Overdosage: If you think you have taken too much of this medicine contact a poison control center or emergency room at once. NOTE: This medicine is only for you. Do not share this medicine with others. What if I miss a dose? This does not apply. What may interact with this medicine?  chemotherapy or radiation therapy  medicines that lower your immune system like etanercept, anakinra, infliximab, and adalimumab  medicines that treat or prevent blood clots like warfarin  phenytoin  steroid medicines like prednisone or cortisone  theophylline  vaccines This list may not describe all possible  interactions. Give your health care provider a list of all the medicines, herbs, non-prescription drugs, or dietary supplements you use. Also tell them if you smoke, drink alcohol, or use illegal drugs. Some items may interact with your medicine. What should I watch for while using this medicine? Report any side effects that do not go away within 3 days to your doctor or health care professional. Call your health care provider if any unusual symptoms occur within 6 weeks of receiving this vaccine. You may still catch the flu, but the illness is not usually as bad. You cannot get the flu from the vaccine. The vaccine will not protect against colds or other illnesses that may cause fever. The vaccine is needed every year. What side effects may I notice from receiving this medicine? Side effects that you should report to your doctor or health care professional as soon as possible:  allergic reactions like skin rash, itching or hives, swelling of the face, lips, or tongue Side effects that usually do not require medical attention (report to your doctor or health care professional if they continue or are bothersome):  fever  headache  muscle aches and pains  pain, tenderness, redness, or swelling at site where injected  weak or tired This list may not describe all possible side effects. Call your doctor for medical advice about side effects. You may report side effects to FDA at 1-800-FDA-1088. Where should I keep my medicine? This vaccine is only given in a clinic, pharmacy, doctor's office, or other health care setting and will not be stored at home. NOTE: This sheet is a summary. It may not cover all possible information. If you have questions   about this medicine, talk to your doctor, pharmacist, or health care provider.  2020 Elsevier/Gold Standard (2007-07-23 09:30:40)  
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
## 2019-10-27 LAB — COMPREHENSIVE METABOLIC PANEL
ALT: 43 IU/L (ref 0–44)
AST: 108 IU/L — ABNORMAL HIGH (ref 0–40)
Albumin/Globulin Ratio: 1.4 (ref 1.2–2.2)
Albumin: 4.4 g/dL (ref 3.8–4.9)
Alkaline Phosphatase: 170 IU/L — ABNORMAL HIGH (ref 44–121)
BUN/Creatinine Ratio: 6 — ABNORMAL LOW (ref 10–24)
BUN: 4 mg/dL — ABNORMAL LOW (ref 8–27)
Bilirubin Total: 0.7 mg/dL (ref 0.0–1.2)
CO2: 18 mmol/L — ABNORMAL LOW (ref 20–29)
Calcium: 9.1 mg/dL (ref 8.6–10.2)
Chloride: 95 mmol/L — ABNORMAL LOW (ref 96–106)
Creatinine, Ser: 0.72 mg/dL — ABNORMAL LOW (ref 0.76–1.27)
GFR calc Af Amer: 117 mL/min/{1.73_m2} (ref >59)
GFR calc non Af Amer: 101 mL/min/{1.73_m2} (ref >59)
Globulin, Total: 3.2 g/dL (ref 1.5–4.5)
Glucose: 86 mg/dL (ref 65–99)
Potassium: 3.9 mmol/L (ref 3.5–5.2)
Sodium: 132 mmol/L — ABNORMAL LOW (ref 134–144)
Total Protein: 7.6 g/dL (ref 6.0–8.5)

## 2019-10-27 LAB — CBC
Hematocrit: 42.2 % (ref 37.5–51.0)
Hemoglobin: 14.5 g/dL (ref 13.0–17.7)
MCH: 34.8 pg — ABNORMAL HIGH (ref 26.6–33.0)
MCHC: 34.4 g/dL (ref 31.5–35.7)
MCV: 101 fL — ABNORMAL HIGH (ref 79–97)
Platelets: 133 10*3/uL — ABNORMAL LOW (ref 150–450)
RBC: 4.17 x10E6/uL (ref 4.14–5.80)
RDW: 12.7 % (ref 11.6–15.4)
WBC: 8.7 10*3/uL (ref 3.4–10.8)

## 2019-10-27 LAB — LIPID PANEL
Chol/HDL Ratio: 2 ratio (ref 0.0–5.0)
Cholesterol, Total: 133 mg/dL (ref 100–199)
HDL: 67 mg/dL (ref >39)
LDL Chol Calc (NIH): 53 mg/dL (ref 0–99)
Triglycerides: 61 mg/dL (ref 0–149)
VLDL Cholesterol Cal: 13 mg/dL (ref 5–40)

## 2019-10-27 LAB — PSA: Prostate Specific Ag, Serum: 1.3 ng/mL (ref 0.0–4.0)

## 2019-10-27 NOTE — Progress Notes (Signed)
Let pt know that his platelet count is low.  PLT are the cells that help his blood clot normally if he cuts himself.  Sodium level is low. Liver function test is abnormal.  All of these abnormalities are due to his excessive alcohol consumption.  Strongly encourage him to cut down on the drinking before he irreversibly damages his liver.  Cholesterol level normal.

## 2019-11-03 MED FILL — PROAIR HFA 90 MCG INHALER: 108 (90 BAS | 25 days supply | Qty: 9 | Fill #2

## 2019-11-09 ENCOUNTER — Ambulatory Visit
Admission: RE | Admit: 2019-11-09 | Discharge: 2019-11-09 | Disposition: A | Discharge status: Home / Self Care | Payer: No Typology Code available for payment source | Source: Ambulatory Visit | Attending: Acute Care | Admitting: Acute Care

## 2019-11-09 ENCOUNTER — Ambulatory Visit: Payer: Medicaid Other

## 2019-11-09 DIAGNOSIS — J432 Centrilobular emphysema: Secondary | ICD-10-CM | POA: Diagnosis not present

## 2019-11-09 DIAGNOSIS — M47814 Spondylosis without myelopathy or radiculopathy, thoracic region: Secondary | ICD-10-CM | POA: Diagnosis not present

## 2019-11-09 DIAGNOSIS — I2510 Atherosclerotic heart disease of native coronary artery without angina pectoris: Secondary | ICD-10-CM | POA: Diagnosis not present

## 2019-11-09 DIAGNOSIS — F17210 Nicotine dependence, cigarettes, uncomplicated: Secondary | ICD-10-CM

## 2019-11-12 NOTE — Progress Notes (Signed)
Please call patient and let them  know their  low dose Ct was read as a Lung RADS 2: nodules that are benign in appearance and behavior with a very low likelihood of becoming a clinically active cancer due to size or lack of growth. Recommendation per radiology is for a repeat LDCT in 12 months. .Please let them  know we will order and schedule their  annual screening scan for 11/2020 Please let them  know there was notation of CAD on their  scan.  Please remind the patient  that this is a non-gated exam therefore degree or severity of disease  cannot be determined. Please have them  follow up with their PCP regarding potential risk factor modification, dietary therapy or pharmacologic therapy if clinically indicated. Pt.  is not  currently on statin therapy. Please place order for annual  screening scan for  01/2020 and fax results to PCP. Thanks so much.  Angelique Blonder, there was notation of  Hepatic steatosis. This  is a term that describes the build up of fat in the liver. It is normal to have small amounts of fat in your liver, but when the proportion of liver cells that contain fat exceeds more than 5% it is indicative of early stage fatty liver.Treatment often involves reducing risk factors through a diet and exercise plan. It is generally a benign condition, but in a small percentage of patients it does require follow up. Please have the patient follow up with PCP regarding potential risk factor modification, dietary therapy or pharmacologic therapy if clinically indicated.  Thanks so much

## 2019-11-19 DIAGNOSIS — R32 Unspecified urinary incontinence: Secondary | ICD-10-CM | POA: Diagnosis not present

## 2020-01-15 MED FILL — AMLODIPINE BESYLATE 10 MG T: 10 | 90 days supply | Qty: 90 | Fill #2

## 2020-01-15 MED FILL — PROAIR HFA 90 MCG INHALER: 108 (90 BAS | 25 days supply | Qty: 9 | Fill #2

## 2020-01-15 MED FILL — MELOXICAM 15 MG TABLET: 15 | 90 days supply | Qty: 90 | Fill #1

## 2020-01-26 DIAGNOSIS — R32 Unspecified urinary incontinence: Secondary | ICD-10-CM | POA: Diagnosis not present

## 2020-02-18 DIAGNOSIS — R32 Unspecified urinary incontinence: Secondary | ICD-10-CM | POA: Diagnosis not present

## 2020-03-09 DIAGNOSIS — R32 Unspecified urinary incontinence: Secondary | ICD-10-CM | POA: Diagnosis not present

## 2020-04-27 ENCOUNTER — Other Ambulatory Visit: Payer: Self-pay

## 2020-04-27 ENCOUNTER — Other Ambulatory Visit: Payer: Self-pay | Admitting: Internal Medicine

## 2020-04-27 DIAGNOSIS — M130 Polyarthritis, unspecified: Secondary | ICD-10-CM

## 2020-04-27 DIAGNOSIS — J439 Emphysema, unspecified: Secondary | ICD-10-CM

## 2020-04-27 DIAGNOSIS — I10 Essential (primary) hypertension: Secondary | ICD-10-CM

## 2020-04-27 NOTE — Telephone Encounter (Signed)
Requested medication (s) are due for refill today: yes   Requested medication (s) are on the active medication list: yes   /Last refill:  01/15/2020  Future visit scheduled: no  Notes to clinic: overdue for follow up appointment  Due for follow up on 02/26/2020  Requested Prescriptions  Pending Prescriptions Disp Refills   amLODipine (NORVASC) 10 MG tablet 90 tablet 6    Sig: TAKE 1/2 TABLET BY MOUTH TWICE DAILY      Cardiovascular:  Calcium Channel Blockers Failed - 04/27/2020 11:42 AM      Failed - Last BP in normal range    BP Readings from Last 1 Encounters:  10/26/19 (!) 157/110          Failed - Valid encounter within last 6 months    Recent Outpatient Visits           6 months ago Essential hypertension   [facility] Marcine Matar, MD   1 year ago ETOH abuse   [facility] Marcine Matar, MD   1 year ago ETOH abuse   [place] [facility] [place], [place] D, LCSW   1 year ago Need for immunization against influenza   [facility] Marcine Matar, MD   1 year ago Essential hypertension   [place] [facility] Jonah Blue B, MD                  meloxicam (MOBIC) 15 MG tablet 90 tablet 3    Sig: TAKE 1 TABLET (15 MG TOTAL) BY MOUTH DAILY.      Analgesics:  COX2 Inhibitors Failed - 04/27/2020 11:42 AM      Failed - Cr in normal range and within 360 days    Creat  Date Value Ref Range Status  02/07/2016 0.86 0.70 - 1.33 mg/dL Final    Comment:      For patients > or = 61 years of age: The upper reference limit for Creatinine is approximately 13% higher for people identified as African-American.      Creatinine, Ser  Date Value Ref Range Status  10/26/2019 0.72 (L) 0.76 - 1.27 mg/dL Final          Passed - HGB in normal range and within 360 days    Hemoglobin  Date Value Ref Range Status  10/26/2019 14.5  13.0 - 17.7 g/dL Final          Passed - Patient is not pregnant      Passed - Valid encounter within last 12 months    Recent Outpatient Visits           6 months ago Essential hypertension   [facility] Marcine Matar, MD   1 year ago ETOH abuse   [facility] Marcine Matar, MD   1 year ago ETOH abuse   [place] [address] [facility] [place], [place] D, LCSW   1 year ago Need for immunization against influenza   [facility] Marcine Matar, MD   1 year ago Essential hypertension   [place] [facility] Marcine Matar, MD                  albuterol (PROAIR HFA) 108 (90 Base) MCG/ACT inhaler 8.5 g 6    Sig: INHALE 2 PUFFS INTO  THE LUNGS EVERY 6 (SIX) HOURS AS NEEDED FOR WHEEZING OR SHORTNESS OF BREATH.      Pulmonology:  Beta Agonists Failed - 04/27/2020 11:42 AM      Failed - One inhaler should last at least one month. If the patient is requesting refills earlier, contact the patient to check for uncontrolled symptoms.      Passed - Valid encounter within last 12 months    Recent Outpatient Visits           6 months ago Essential hypertension   [place] [facility] Marcine Matar, MD   1 year ago ETOH abuse   [place] [facility] Marcine Matar, MD   1 year ago ETOH abuse    [address] [facility] Jenel Lucks D, LCSW   1 year ago Need for immunization against influenza   [facility] Marcine Matar, MD   1 year ago Essential hypertension   [facility] Marcine Matar, MD

## 2020-05-04 ENCOUNTER — Other Ambulatory Visit: Payer: Self-pay

## 2020-07-09 DIAGNOSIS — R32 Unspecified urinary incontinence: Secondary | ICD-10-CM | POA: Diagnosis not present

## 2020-07-12 DIAGNOSIS — R32 Unspecified urinary incontinence: Secondary | ICD-10-CM | POA: Diagnosis not present

## 2020-08-10 ENCOUNTER — Other Ambulatory Visit: Payer: Self-pay

## 2020-08-10 ENCOUNTER — Other Ambulatory Visit: Payer: Self-pay | Admitting: Internal Medicine

## 2020-08-10 DIAGNOSIS — I10 Essential (primary) hypertension: Secondary | ICD-10-CM

## 2020-08-10 DIAGNOSIS — J439 Emphysema, unspecified: Secondary | ICD-10-CM

## 2020-08-10 NOTE — Telephone Encounter (Signed)
Requested medication (s) are due for refill today: Yes  Requested medication (s) are on the active medication list: Yes  Last refill:    Future visit scheduled: Yes  Notes to clinic:  All prescriptions are expired.    Requested Prescriptions  Pending Prescriptions Disp Refills   albuterol (PROVENTIL) (2.5 MG/3ML) 0.083% nebulizer solution 150 mL 3    Sig: Take 3 mLs (2.5 mg total) by nebulization every 6 (six) hours as needed for wheezing or shortness of breath.      Pulmonology:  Beta Agonists Failed - 08/10/2020  9:16 AM      Failed - One inhaler should last at least one month. If the patient is requesting refills earlier, contact the patient to check for uncontrolled symptoms.      Passed - Valid encounter within last 12 months    Recent Outpatient Visits           9 months ago Essential hypertension   [place] [facility] Marcine Matar, MD   1 year ago ETOH abuse   [place] [facility] Marcine Matar, MD   1 year ago ETOH abuse   [place] [address] [facility] Jenel Lucks D, LCSW   1 year ago Need for immunization against influenza   [facility] Marcine Matar, MD   2 years ago Essential hypertension   [place] [facility] Marcine Matar, MD       Future Appointments             In 2 months Marcine Matar, MD [facility]               budesonide-formoterol [facility]) 80-4.5 MCG/ACT inhaler 1 each 5    Sig: Inhale 2 puffs into the lungs 2 (two) times daily. DX: Pulmonary emphysema, J 43.9      Pulmonology:  Combination Products Passed - 08/10/2020  9:16 AM      Passed - Valid encounter within last 12 months    Recent Outpatient Visits           9 months ago Essential hypertension   [place] [facility] Marcine Matar, MD   1 year ago ETOH abuse   [place] [facility] Marcine Matar, MD   1 year ago ETOH abuse   [place] [facility] Jenel Lucks D, LCSW   1 year ago Need for immunization against influenza   [facility] Marcine Matar, MD   2 years ago Essential hypertension   [place] [facility] Marcine Matar, MD       Future Appointments             In 2 months Marcine Matar, MD [facility]               amLODipine (NORVASC) 10 MG tablet 90 tablet 6    Sig: TAKE 1/2 TABLET BY MOUTH TWICE DAILY      Cardiovascular:  Calcium Channel Blockers Failed - 08/10/2020  9:16 AM      Failed - Last BP in normal range    BP Readings from Last 1 Encounters:  10/26/19 (!) 157/110          Failed - Valid encounter within last 6 months    Recent Outpatient Visits  9 months ago Essential hypertension   [place] [facility] Marcine Matar, MD   1 year ago ETOH abuse   [place] [facility] Marcine Matar, MD   1 year ago ETOH abuse   [place] [address] [facility] [place], [place] D, LCSW   1 year ago Need for immunization against influenza   [facility] Marcine Matar, MD   2 years ago Essential hypertension   [place] [facility] Marcine Matar, MD       Future Appointments             In 2 months Marcine Matar, MD [place] [facility]               albuterol [facility] HFA) 108 (90 Base) MCG/ACT inhaler 8.5 g 6    Sig: INHALE 2 PUFFS INTO THE LUNGS EVERY 6 (SIX) HOURS AS NEEDED FOR WHEEZING OR SHORTNESS OF BREATH.      Pulmonology:  Beta Agonists Failed - 08/10/2020  9:16 AM      Failed - One inhaler should last at least one month. If the patient is requesting refills earlier, contact the patient to check for uncontrolled symptoms.      Passed - Valid  encounter within last 12 months    Recent Outpatient Visits           9 months ago Essential hypertension   [place] [facility] Marcine Matar, MD   1 year ago ETOH abuse   [place] [facility] Marcine Matar, MD   1 year ago ETOH abuse   [place] [address] [facility] Jenel Lucks D, LCSW   1 year ago Need for immunization against influenza   [facility] Marcine Matar, MD   2 years ago Essential hypertension   [facility] Marcine Matar, MD       Future Appointments             In 2 months Laural Benes Binnie Rail, MD [facility]

## 2020-08-11 ENCOUNTER — Other Ambulatory Visit: Payer: Self-pay

## 2020-08-11 MED ORDER — BUDESONIDE-FORMOTEROL FUMARATE 80-4.5 MCG/ACT IN AERO
2.0000 | INHALATION_SPRAY | Freq: Two times a day (BID) | RESPIRATORY_TRACT | 2 refills | Status: DC
  Filled 2020-08-11: qty 10.2, 30d supply, fill #0

## 2020-08-11 MED ORDER — ALBUTEROL SULFATE (2.5 MG/3ML) 0.083% IN NEBU
2.5000 mg | INHALATION_SOLUTION | Freq: Four times a day (QID) | RESPIRATORY_TRACT | 2 refills | Status: DC | PRN
  Filled 2020-08-11 – 2021-07-06 (×2): qty 150, 13d supply, fill #0

## 2020-08-11 MED ORDER — AMLODIPINE BESYLATE 10 MG PO TABS
ORAL_TABLET | Freq: Two times a day (BID) | ORAL | 2 refills | Status: DC
  Filled 2020-08-11 – 2020-10-13 (×2): qty 30, 30d supply, fill #0

## 2020-08-11 MED ORDER — ALBUTEROL SULFATE HFA 108 (90 BASE) MCG/ACT IN AERS
2.0000 | INHALATION_SPRAY | Freq: Four times a day (QID) | RESPIRATORY_TRACT | 2 refills | Status: DC | PRN
  Filled 2020-08-11: qty 8.5, 25d supply, fill #0

## 2020-08-19 ENCOUNTER — Other Ambulatory Visit: Payer: Self-pay

## 2020-08-22 DIAGNOSIS — R32 Unspecified urinary incontinence: Secondary | ICD-10-CM | POA: Diagnosis not present

## 2020-10-10 ENCOUNTER — Ambulatory Visit: Payer: Medicaid Other | Attending: Internal Medicine | Admitting: Internal Medicine

## 2020-10-10 ENCOUNTER — Encounter: Payer: Self-pay | Admitting: Internal Medicine

## 2020-10-10 ENCOUNTER — Other Ambulatory Visit: Payer: Self-pay

## 2020-10-10 VITALS — BP 152/96 | HR 87 | Wt 130.2 lb

## 2020-10-10 DIAGNOSIS — J439 Emphysema, unspecified: Secondary | ICD-10-CM | POA: Insufficient documentation

## 2020-10-10 DIAGNOSIS — Z122 Encounter for screening for malignant neoplasm of respiratory organs: Secondary | ICD-10-CM | POA: Insufficient documentation

## 2020-10-10 DIAGNOSIS — Z125 Encounter for screening for malignant neoplasm of prostate: Secondary | ICD-10-CM | POA: Insufficient documentation

## 2020-10-10 DIAGNOSIS — R42 Dizziness and giddiness: Secondary | ICD-10-CM | POA: Insufficient documentation

## 2020-10-10 DIAGNOSIS — Z1211 Encounter for screening for malignant neoplasm of colon: Secondary | ICD-10-CM | POA: Insufficient documentation

## 2020-10-10 DIAGNOSIS — Z23 Encounter for immunization: Secondary | ICD-10-CM | POA: Diagnosis not present

## 2020-10-10 DIAGNOSIS — F1010 Alcohol abuse, uncomplicated: Secondary | ICD-10-CM | POA: Diagnosis not present

## 2020-10-10 DIAGNOSIS — R159 Full incontinence of feces: Secondary | ICD-10-CM | POA: Insufficient documentation

## 2020-10-10 DIAGNOSIS — Z8249 Family history of ischemic heart disease and other diseases of the circulatory system: Secondary | ICD-10-CM | POA: Insufficient documentation

## 2020-10-10 DIAGNOSIS — Z7951 Long term (current) use of inhaled steroids: Secondary | ICD-10-CM | POA: Insufficient documentation

## 2020-10-10 DIAGNOSIS — R3981 Functional urinary incontinence: Secondary | ICD-10-CM | POA: Diagnosis not present

## 2020-10-10 DIAGNOSIS — Z720 Tobacco use: Secondary | ICD-10-CM

## 2020-10-10 DIAGNOSIS — I10 Essential (primary) hypertension: Secondary | ICD-10-CM | POA: Insufficient documentation

## 2020-10-10 DIAGNOSIS — R918 Other nonspecific abnormal finding of lung field: Secondary | ICD-10-CM | POA: Diagnosis not present

## 2020-10-10 DIAGNOSIS — F17210 Nicotine dependence, cigarettes, uncomplicated: Secondary | ICD-10-CM

## 2020-10-10 DIAGNOSIS — F17200 Nicotine dependence, unspecified, uncomplicated: Secondary | ICD-10-CM | POA: Diagnosis not present

## 2020-10-10 MED ORDER — BUDESONIDE-FORMOTEROL FUMARATE 80-4.5 MCG/ACT IN AERO
2.0000 | INHALATION_SPRAY | Freq: Two times a day (BID) | RESPIRATORY_TRACT | 6 refills | Status: DC
  Filled 2020-10-10 – 2021-02-10 (×4): qty 10.2, 30d supply, fill #0
  Filled 2021-07-06: qty 10.2, 30d supply, fill #1
  Filled 2021-09-27: qty 10.2, 30d supply, fill #2

## 2020-10-10 MED ORDER — ALBUTEROL SULFATE HFA 108 (90 BASE) MCG/ACT IN AERS
2.0000 | INHALATION_SPRAY | Freq: Four times a day (QID) | RESPIRATORY_TRACT | 6 refills | Status: DC | PRN
  Filled 2020-10-10: qty 8.5, 25d supply, fill #0
  Filled 2020-10-18 – 2021-02-10 (×3): qty 18, 25d supply, fill #0
  Filled 2021-07-06: qty 18, 25d supply, fill #1
  Filled 2021-09-27: qty 18, 25d supply, fill #2

## 2020-10-10 NOTE — Progress Notes (Signed)
Patient ID: Brent Warren, male    DOB: 1960-02-19  MRN: [id number]  CC: Hypertension   Subjective: Brent Warren is a 60 y.o. male who presents for chronic ds management.  Last seen 1 yr ago His concerns today include:  Patient with history of htn, copd, tob and etoh abuse, hx of  Right empyema sp VATS 5/17, with findings of multiple lung nodules on CT scan (gets yr LDCT), urinary incontience    Lives with brother. Still drinking heavy.  Drinks two 40 oz day.  Smoking 3/4 a pk a day.  Not ready to quit either. Due for LDCT chest.  He had nodules on previous CAT scans that are being monitored.  Pt is 40+ pk/yr smoker. Uses his inhalers QOD.  +Smokers cough in a.m.  No blood in mucous.  No increase SOB.No CP Out of Norvasc x 3 days.  Does not use salt in foods.  Occasionally dizziness.  No HA Still has incontinence of urine and fecal incontinence intermittently.  He states that if he is unable to make it inside quick enough to use the bathroom to urinate, he urinates outside.  He finds that taking Imodium once or twice a week helps to keep his stools firm.  He stopped wearing the Depends.  Still has some but does not feels he needs them.  Eats 2 meals a day.  No blood in stools  HM: due for flu shot, prostate cancer screening and colon CA screen   Patient Active Problem List   Diagnosis Date Noted   COVID-19 vaccine series completed 10/26/2019   Polyarthritis 02/05/2019   Chest pain in adult 04/02/2017   Immunization due 11/06/2016   Pulmonary emphysema (HCC) 11/06/2016   Urge incontinence 11/06/2016   Tobacco abuse 11/06/2016   ETOH abuse 11/06/2016   Lung nodule, multiple 11/08/2015   HTN (hypertension) 08/01/2015   Hepatitis 05/22/2015     Current Outpatient Medications on File Prior to Visit  Medication Sig Dispense Refill   albuterol (PROAIR HFA) 108 (90 Base) MCG/ACT inhaler INHALE 2 PUFFS INTO THE LUNGS EVERY 6 (SIX) HOURS AS NEEDED FOR WHEEZING OR SHORTNESS OF BREATH. 8.5  g 2   albuterol (PROVENTIL) (2.5 MG/3ML) 0.083% nebulizer solution Take 3 mLs (2.5 mg total) by nebulization every 6 (six) hours as needed for wheezing or shortness of breath. 150 mL 2   amLODipine (NORVASC) 10 MG tablet TAKE 1/2 TABLET BY MOUTH TWICE DAILY 30 tablet 2   budesonide-formoterol (SYMBICORT) 80-4.5 MCG/ACT inhaler Inhale 2 puffs into the lungs 2 (two) times daily. DX: Pulmonary emphysema, J 43.9 10.2 g 2   loperamide (IMODIUM A-D) 2 MG tablet Take 1 tablet (2 mg total) by mouth 2 (two) times daily as needed for diarrhea or loose stools. 30 tablet 1   No current facility-administered medications on file prior to visit.    No Known Allergies  Social History   Socioeconomic History   Marital status: Single    Spouse name: Not on file   Number of children: Not on file   Years of education: Not on file   Highest education level: Not on file  Occupational History   Not on file  Tobacco Use   Smoking status: Every Day    Packs/day: 1.00    Years: 47.00    Pack years: 47.00    Types: Cigarettes    Start date: 07/08/2015   Smokeless tobacco: Never   Tobacco comments:    a pack in a couple  of days  Vaping Use   Vaping Use: Never used  Substance and Sexual Activity   Alcohol use: Yes    Alcohol/week: 2.0 standard drinks    Types: 2 Cans of beer per week    Comment: daily   Drug use: No   Sexual activity: Not Currently  Other Topics Concern   Not on file  Social History Narrative   Not on file   Social Determinants of Health   Financial Resource Strain: Not on file  Food Insecurity: Not on file  Transportation Needs: Not on file  Physical Activity: Not on file  Stress: Not on file  Social Connections: Not on file  Intimate Partner Violence: Not on file    Family History  Problem Relation Age of Onset   Heart attack Mother    Cirrhosis Father    Stroke Sister    Heart attack Brother     Past Surgical History:  Procedure Laterality Date   arm surgery      DECORTICATION Right 06/03/2015   Procedure: DECORTICATION;  Surgeon: [name], MD;  Location: [facility] OR;  Service: Thoracic;  Laterality: Right;   VIDEO ASSISTED THORACOSCOPY (VATS)/EMPYEMA Right 06/03/2015   Procedure: VIDEO ASSISTED THORACOSCOPY (VATS)/EMPYEMA;  Surgeon: [name], MD;  Location: [facility] OR;  Service: Thoracic;  Laterality: Right;    ROS: Review of Systems Negative except as stated above  PHYSICAL EXAM: BP (!) 166/100   Pulse 87   Wt 130 lb 3.2 oz (59.1 kg)   SpO2 (!) 16%   PF 97 L/min   BMI 19.23 kg/m   Wt Readings from Last 3 Encounters:  10/10/20 130 lb 3.2 oz (59.1 kg)  10/26/19 130 lb 12.8 oz (59.3 kg)  07/07/18 134 lb (60.8 kg)    Physical Exam   General appearance - alert, well appearing, older caucasian and in no distress Mental status - normal mood, behavior, speech, dress, motor activity, and thought processes Neck - supple, no significant adenopathy Nose: Nasal bridge and bone tilted to the left causing narrowing of the left nasal passage Chest - clear to auscultation, no wheezes, rales or rhonchi, symmetric air entry Heart - normal rate, regular rhythm, normal S1, S2, no murmurs, rubs, clicks or gallops Extremities - peripheral pulses normal, no pedal edema, no clubbing or cyanosis  CMP Latest Ref Rng & Units 10/26/2019 07/07/2018 11/21/2017  Glucose 65 - 99 mg/dL 86 81 -  BUN 8 - 27 mg/dL 4(L) 6 -  Creatinine 9.32 - 1.27 mg/dL 6.71(I) 4.58(K) -  Sodium 134 - 144 mmol/L 132(L) 140 -  Potassium 3.5 - 5.2 mmol/L 3.9 4.1 -  Chloride 96 - 106 mmol/L 95(L) 105 -  CO2 20 - 29 mmol/L 18(L) 18(L) -  Calcium 8.6 - 10.2 mg/dL 9.1 9.0 -  Total Protein 6.0 - 8.5 g/dL 7.6 7.4 7.9  Total Bilirubin 0.0 - 1.2 mg/dL 0.7 0.3 <9.9  Alkaline Phos 44 - 121 IU/L 170(H) 80 55  AST 0 - 40 IU/L 108(H) 150(H) 38  ALT 0 - 44 IU/L 43 64(H) 29   Lipid Panel     Component Value Date/Time   CHOL 133 10/26/2019 1706   TRIG 61 10/26/2019 1706   HDL  67 10/26/2019 1706   CHOLHDL 2.0 10/26/2019 1706   LDLCALC 53 10/26/2019 1706    CBC    Component Value Date/Time   WBC 8.7 10/26/2019 1706   WBC 4.9 06/16/2016 1715   RBC 4.17 10/26/2019 1706  RBC 4.12 (L) 06/16/2016 1715   HGB 14.5 10/26/2019 1706   HCT 42.2 10/26/2019 1706   PLT 133 (L) 10/26/2019 1706   MCV 101 (H) 10/26/2019 1706   MCH 34.8 (H) 10/26/2019 1706   MCH 34.0 06/16/2016 1715   MCHC 34.4 10/26/2019 1706   MCHC 33.3 06/16/2016 1715   RDW 12.7 10/26/2019 1706   LYMPHSABS 2.3 06/16/2016 1715   MONOABS 0.3 06/16/2016 1715   EOSABS 0.3 06/16/2016 1715   BASOSABS 0.1 06/16/2016 1715    ASSESSMENT AND PLAN: 1. Essential hypertension Not at goal.  He has been out of Norvasc.  Refill given. - CBC - Comprehensive metabolic panel - Lipid panel  2. Tobacco abuse Strongly advised to quit.  Patient not ready to give a trial of quitting.  3. ETOH abuse Advised to quit.  Patient not willing to quit at this time.  Encouraged him to at least try to cut back to no more than one 40 oz bottle of beer a day  4. Prostate cancer screening Patient agreeable to prostate cancer screening with a PSA draw. - PSA  5. Functional fecal incontinence 6. Functional urinary incontinence Patient is declines prescription for incontinence supply stating that he still has a lot of home.  7. Pulmonary emphysema, unspecified emphysema type (HCC) - budesonide-formoterol (SYMBICORT) 80-4.5 MCG/ACT inhaler; Inhale 2 puffs into the lungs 2 (two) times daily. DX: Pulmonary emphysema, J 43.9  Dispense: 10.2 g; Refill: 6 - albuterol (PROAIR HFA) 108 (90 Base) MCG/ACT inhaler; INHALE 2 PUFFS INTO THE LUNGS EVERY 6 (SIX) HOURS AS NEEDED FOR WHEEZING OR SHORTNESS OF BREATH.  Dispense: 8.5 g; Refill: 6  8. Pulmonary nodules Due for repeat low-dose CT of the chest.  9. Screening for colon cancer Discussed colon cancer screening methods.  Patient willing to do Cologuard. - Cologuard  10.  Screening for lung cancer - CT CHEST NODULE FOLLOW UP LOW DOSE W/O; Future  11. Need for immunization against influenza - Flu Vaccine QUAD 25mo+IM (Fluarix, Fluzone & Alfiuria Quad PF)   Patient was given the opportunity to ask questions.  Patient verbalized understanding of the plan and was able to repeat key elements of the plan.   No orders of the defined types were placed in this encounter.    Requested Prescriptions    No prescriptions requested or ordered in this encounter    No follow-ups on file.  Jonah Blue, MD, FACP

## 2020-10-11 ENCOUNTER — Other Ambulatory Visit: Payer: Self-pay

## 2020-10-11 LAB — LIPID PANEL
Chol/HDL Ratio: 2 ratio (ref 0.0–5.0)
Cholesterol, Total: 153 mg/dL (ref 100–199)
HDL: 75 mg/dL (ref >39)
LDL Chol Calc (NIH): 62 mg/dL (ref 0–99)
Triglycerides: 83 mg/dL (ref 0–149)
VLDL Cholesterol Cal: 16 mg/dL (ref 5–40)

## 2020-10-11 LAB — COMPREHENSIVE METABOLIC PANEL
ALT: 62 IU/L — ABNORMAL HIGH (ref 0–44)
AST: 112 IU/L — ABNORMAL HIGH (ref 0–40)
Albumin/Globulin Ratio: 1.5 (ref 1.2–2.2)
Albumin: 4.6 g/dL (ref 3.8–4.8)
Alkaline Phosphatase: 87 IU/L (ref 44–121)
BUN/Creatinine Ratio: 8 — ABNORMAL LOW (ref 10–24)
BUN: 6 mg/dL — ABNORMAL LOW (ref 8–27)
Bilirubin Total: 0.4 mg/dL (ref 0.0–1.2)
CO2: 20 mmol/L (ref 20–29)
Calcium: 9.7 mg/dL (ref 8.6–10.2)
Chloride: 96 mmol/L (ref 96–106)
Creatinine, Ser: 0.73 mg/dL — ABNORMAL LOW (ref 0.76–1.27)
Globulin, Total: 3 g/dL (ref 1.5–4.5)
Glucose: 84 mg/dL (ref 70–99)
Potassium: 4.9 mmol/L (ref 3.5–5.2)
Sodium: 134 mmol/L (ref 134–144)
Total Protein: 7.6 g/dL (ref 6.0–8.5)
eGFR: 104 mL/min/{1.73_m2} (ref >59)

## 2020-10-11 LAB — CBC
Hematocrit: 37.2 % — ABNORMAL LOW (ref 37.5–51.0)
Hemoglobin: 13.1 g/dL (ref 13.0–17.7)
MCH: 35 pg — ABNORMAL HIGH (ref 26.6–33.0)
MCHC: 35.2 g/dL (ref 31.5–35.7)
MCV: 100 fL — ABNORMAL HIGH (ref 79–97)
Platelets: 192 10*3/uL (ref 150–450)
RBC: 3.74 x10E6/uL — ABNORMAL LOW (ref 4.14–5.80)
RDW: 12.8 % (ref 11.6–15.4)
WBC: 6.8 10*3/uL (ref 3.4–10.8)

## 2020-10-11 LAB — PSA: Prostate Specific Ag, Serum: 2 ng/mL (ref 0.0–4.0)

## 2020-10-12 ENCOUNTER — Telehealth: Payer: Self-pay | Admitting: Internal Medicine

## 2020-10-12 NOTE — Telephone Encounter (Signed)
Pt is requesting to have his medication changed. He wants 5 MG instead of 10 MG, the pill is too thick to split. amLODipine (NORVASC) 5 MG tablet  [facility]  [address] [place] [address]  Phone: [phone] Fax: [phone]

## 2020-10-12 NOTE — Progress Notes (Signed)
Blood cell counts are okay. Kidney function normal. Liver function test abnormal likely due to excess drinking of alcoholic beverages.  I strongly encouraged him to cut back to prevent permanent damage to his liver. Cholesterol levels normal. PSA which is a screening test for prostate cancer is normal.

## 2020-10-13 ENCOUNTER — Telehealth: Payer: Self-pay

## 2020-10-13 ENCOUNTER — Other Ambulatory Visit: Payer: Self-pay

## 2020-10-13 NOTE — Telephone Encounter (Signed)
Pt called back to check status of request, please advise. Pt is completely out of his current supply.

## 2020-10-13 NOTE — Telephone Encounter (Signed)
Contacted pt to go over appointment information.   CT  is scheduled for October 13 at 330pm at [facility]. Pt will need to arrive at 315pm   Pt is aware and doesn't have any questions or concerns    Pt is aware of lab results and doesn't have any questions or concerns

## 2020-10-13 NOTE — Telephone Encounter (Signed)
Patient aware of message per PCP.  Will pick up Rx today from pharmacy.

## 2020-10-13 NOTE — Telephone Encounter (Signed)
Called pt left VM to call back for instructions.

## 2020-10-14 DIAGNOSIS — R32 Unspecified urinary incontinence: Secondary | ICD-10-CM | POA: Diagnosis not present

## 2020-10-18 ENCOUNTER — Other Ambulatory Visit: Payer: Self-pay | Admitting: Internal Medicine

## 2020-10-18 ENCOUNTER — Other Ambulatory Visit: Payer: Self-pay

## 2020-10-18 DIAGNOSIS — M130 Polyarthritis, unspecified: Secondary | ICD-10-CM

## 2020-10-19 ENCOUNTER — Telehealth: Payer: Self-pay | Admitting: *Deleted

## 2020-10-19 DIAGNOSIS — I10 Essential (primary) hypertension: Secondary | ICD-10-CM

## 2020-10-19 NOTE — Telephone Encounter (Signed)
Copied from CRM [id number]. Topic: General - Other >> Oct 18, 2020  2:16 PM Gaetana Michaelis A wrote: Reason for CRM: The patient would like to discuss the ending of their Rx #: [id number] meloxicam (MOBIC) 15 MG tablet [[id number]] prescription   The patient shares that they would like to continue taking the medication  Please contact further when possible

## 2020-10-19 NOTE — Telephone Encounter (Signed)
Copied from CRM [id number]. Topic: General - Other >> Oct 11, 2020  9:27 AM Jaquita Rector A wrote: Reason for CRM: Patient called in to inform Dr Laural Benes that he can not break the amLODipine (NORVASC) 10 MG tablet in half so would like to have the 5 MG sent to the pharmacy instead. Also asking if [place] could please call him at her earliest convenience. Can be reached at Ph#  [phone]

## 2020-10-19 NOTE — Telephone Encounter (Signed)
Requested medications are due for refill today.  unknowm  Requested medications are on the active medications list.  no  Last refill. 02/05/2019  Future visit scheduled.   yes  Notes to clinic.  medication not on med list.

## 2020-10-20 ENCOUNTER — Other Ambulatory Visit: Payer: Self-pay

## 2020-10-20 ENCOUNTER — Ambulatory Visit (HOSPITAL_COMMUNITY)
Admission: RE | Admit: 2020-10-20 | Discharge: 2020-10-20 | Disposition: A | Discharge status: Home / Self Care | Payer: Medicaid Other | Source: Ambulatory Visit | Attending: Internal Medicine | Admitting: Internal Medicine

## 2020-10-20 DIAGNOSIS — R918 Other nonspecific abnormal finding of lung field: Secondary | ICD-10-CM | POA: Diagnosis not present

## 2020-10-20 DIAGNOSIS — Z122 Encounter for screening for malignant neoplasm of respiratory organs: Secondary | ICD-10-CM | POA: Insufficient documentation

## 2020-10-20 DIAGNOSIS — R911 Solitary pulmonary nodule: Secondary | ICD-10-CM | POA: Diagnosis not present

## 2020-10-20 DIAGNOSIS — Z1211 Encounter for screening for malignant neoplasm of colon: Secondary | ICD-10-CM | POA: Diagnosis not present

## 2020-10-20 DIAGNOSIS — J439 Emphysema, unspecified: Secondary | ICD-10-CM | POA: Diagnosis not present

## 2020-10-20 DIAGNOSIS — I700 Atherosclerosis of aorta: Secondary | ICD-10-CM | POA: Diagnosis not present

## 2020-10-21 ENCOUNTER — Other Ambulatory Visit: Payer: Self-pay

## 2020-10-21 MED ORDER — AMLODIPINE BESYLATE 5 MG PO TABS
5.0000 mg | ORAL_TABLET | Freq: Two times a day (BID) | ORAL | 6 refills | Status: DC
  Filled 2020-10-21 – 2021-02-10 (×3): qty 60, 30d supply, fill #0

## 2020-10-21 MED ORDER — MELOXICAM 15 MG PO TABS
15.0000 mg | ORAL_TABLET | Freq: Every day | ORAL | 5 refills | Status: DC
  Filled 2020-10-21 – 2021-02-10 (×3): qty 30, 30d supply, fill #0
  Filled 2021-07-06: qty 30, 30d supply, fill #1
  Filled 2021-09-27: qty 30, 30d supply, fill #2

## 2020-10-21 NOTE — Telephone Encounter (Signed)
Will forward to provider  

## 2020-10-21 NOTE — Telephone Encounter (Signed)
Will forward to provider in regards to medication  °

## 2020-10-21 NOTE — Addendum Note (Signed)
Addended by: Jonah Blue B on: 10/21/2020 01:12 PM   Modules accepted: Orders

## 2020-10-21 NOTE — Telephone Encounter (Signed)
Returned pt call and made aware that rx has been sent and if he has any questions or concerns to give us a call  

## 2020-10-21 NOTE — Telephone Encounter (Signed)
Returned pt call and made aware that rx has been sent and if he has any questions or concerns to give [place] a call

## 2020-10-21 NOTE — Addendum Note (Signed)
Addended by: Jonah Blue B on: 10/21/2020 01:15 PM   Modules accepted: Orders

## 2020-10-23 NOTE — Progress Notes (Signed)
Let pt know that screening CAT scan of chest shows spots still present in the lungs but no increase in size. Will recheck again in 1 year.

## 2020-10-25 ENCOUNTER — Telehealth: Payer: Self-pay

## 2020-10-25 ENCOUNTER — Other Ambulatory Visit: Payer: Self-pay

## 2020-10-25 NOTE — Telephone Encounter (Signed)
Contacted pt to go over CT results pt didn't answer lvm   Sent a CRM and forward labs to NT to give pt labs when they call back   

## 2020-10-27 LAB — COLOGUARD: Cologuard: NEGATIVE

## 2020-10-28 ENCOUNTER — Other Ambulatory Visit: Payer: Self-pay

## 2020-10-28 ENCOUNTER — Telehealth: Payer: Self-pay

## 2020-10-28 NOTE — Telephone Encounter (Signed)
Contacted pt to go over lab results pt didn't answer  ° °Sent a CRM and forward labs to NT to give pt labs when they call back   °

## 2020-11-02 ENCOUNTER — Other Ambulatory Visit: Payer: Self-pay

## 2020-12-08 DIAGNOSIS — Z419 Encounter for procedure for purposes other than remedying health state, unspecified: Secondary | ICD-10-CM | POA: Diagnosis not present

## 2021-01-08 DIAGNOSIS — Z419 Encounter for procedure for purposes other than remedying health state, unspecified: Secondary | ICD-10-CM | POA: Diagnosis not present

## 2021-01-14 DIAGNOSIS — R32 Unspecified urinary incontinence: Secondary | ICD-10-CM | POA: Diagnosis not present

## 2021-02-08 DIAGNOSIS — Z419 Encounter for procedure for purposes other than remedying health state, unspecified: Secondary | ICD-10-CM | POA: Diagnosis not present

## 2021-02-10 ENCOUNTER — Encounter: Payer: Self-pay | Admitting: Internal Medicine

## 2021-02-10 ENCOUNTER — Ambulatory Visit: Payer: Medicaid Other | Attending: Internal Medicine | Admitting: Internal Medicine

## 2021-02-10 ENCOUNTER — Other Ambulatory Visit: Payer: Self-pay

## 2021-02-10 VITALS — BP 120/79 | HR 94 | Temp 98.1°F | Resp 20 | Wt 136.0 lb

## 2021-02-10 DIAGNOSIS — R7989 Other specified abnormal findings of blood chemistry: Secondary | ICD-10-CM

## 2021-02-10 DIAGNOSIS — I10 Essential (primary) hypertension: Secondary | ICD-10-CM

## 2021-02-10 DIAGNOSIS — J439 Emphysema, unspecified: Secondary | ICD-10-CM

## 2021-02-10 DIAGNOSIS — I700 Atherosclerosis of aorta: Secondary | ICD-10-CM | POA: Diagnosis not present

## 2021-02-10 DIAGNOSIS — F1010 Alcohol abuse, uncomplicated: Secondary | ICD-10-CM

## 2021-02-10 DIAGNOSIS — F17210 Nicotine dependence, cigarettes, uncomplicated: Secondary | ICD-10-CM | POA: Diagnosis not present

## 2021-02-10 DIAGNOSIS — Z720 Tobacco use: Secondary | ICD-10-CM

## 2021-02-10 MED ORDER — AMLODIPINE BESYLATE 10 MG PO TABS
10.0000 mg | ORAL_TABLET | Freq: Every day | ORAL | 6 refills | Status: DC
  Filled 2021-02-10 – 2021-07-06 (×2): qty 30, 30d supply, fill #0
  Filled 2021-09-27 – 2021-11-03 (×2): qty 30, 30d supply, fill #1

## 2021-02-10 NOTE — Progress Notes (Signed)
Patient ID: Brent Warren Warren, male    DOB: 04/11/1960  MRN: [id number]  CC: Hypertension   Subjective: Brent Warren Brent Warren is a 61 y.o. male who presents for chronic ds management.  Brother Moise BoringDonnie, is with him His concerns today include:  Patient with history of htn, copd, tob and etoh abuse, hx of  Right empyema sp VATS 5/17, with findings of multiple lung nodules on CT scan (gets yr LDCT), urinary incontience    ETOH use disorder: reports he has slowed down a little.  He was drinking three 40 oz beer a day.  Now at two 40 oz a day.  I went over labs with him from his last visit.  AST and ALT were elevated in alcoholic pattern.  HTN:  compliant with Norvasc and took already today.  No device to check BP Limits salt in his foods.  Denies any chest pains or lower extremity edema.  I was notified by the pharmacy that his insurance will no longer pay for amlodipine 5 mg twice a day.  They will only pay for the 10 mg tablet once a day.  COPD:  not using Symbicort as often as he should.  Not using Albuterol often either.  No recent flare.   Cough only in mornings.  Still smoking 1 pk a day. Reports he should be ready to quit but still smoking.  Has patches from 1800-Quit now but not ready to use them as yet. Needs COVID booster. He did have his repeat CAT scan of the chest after last visit to follow-up on lung nodules.  Incidental finding was aortic atherosclerosis and coronary calcifications. Patient Active Problem List   Diagnosis Date Noted   COVID-19 vaccine series completed 10/26/2019   Polyarthritis 02/05/2019   Chest pain in adult 04/02/2017   Immunization due 11/06/2016   Pulmonary emphysema (HCC) 11/06/2016   Urge incontinence 11/06/2016   Tobacco abuse 11/06/2016   ETOH abuse 11/06/2016   Lung nodule, multiple 11/08/2015   HTN (hypertension) 08/01/2015   Hepatitis 05/22/2015     Current Outpatient Medications on File Prior to Visit  Medication Sig Dispense Refill   albuterol  (PROVENTIL) (2.5 MG/3ML) 0.083% nebulizer solution Take 3 mLs (2.5 mg total) by nebulization every 6 (six) hours as needed for wheezing or shortness of breath. 150 mL 2   albuterol (VENTOLIN HFA) 108 (90 Base) MCG/ACT inhaler Inhale 2 puffs into the lungs every 6 (six) hours as needed for wheezing or shortness of breath. 18 g 6   budesonide-formoterol (SYMBICORT) 80-4.5 MCG/ACT inhaler Inhale 2 puffs into the lungs 2 (two) times daily 10.2 g 6   meloxicam (MOBIC) 15 MG tablet Take 1 tablet (15 mg total) by mouth daily. 30 tablet 5   loperamide (IMODIUM A-D) 2 MG tablet Take 1 tablet (2 mg total) by mouth 2 (two) times daily as needed for diarrhea or loose stools. (Patient not taking: Reported on 02/10/2021) 30 tablet 1   No current facility-administered medications on file prior to visit.    No Known Allergies  Social History   Socioeconomic History   Marital status: Single    Spouse name: Not on file   Number of children: Not on file   Years of education: Not on file   Highest education level: Not on file  Occupational History   Not on file  Tobacco Use   Smoking status: Every Day    Packs/day: 1.00    Years: 47.00    Pack years: 47.00  Types: Cigarettes    Start date: 07/08/2015   Smokeless tobacco: Never   Tobacco comments:    a pack in a couple of days  Vaping Use   Vaping Use: Never used  Substance and Sexual Activity   Alcohol use: Yes    Alcohol/week: 2.0 standard drinks    Types: 2 Cans of beer per week    Comment: daily   Drug use: No   Sexual activity: Not Currently  Other Topics Concern   Not on file  Social History Narrative   Not on file   Social Determinants of Health   Financial Resource Strain: Not on file  Food Insecurity: Not on file  Transportation Needs: Not on file  Physical Activity: Not on file  Stress: Not on file  Social Connections: Not on file  Intimate Partner Violence: Not on file    Family History  Problem Relation Age of Onset    Heart attack Mother    Cirrhosis Father    Stroke Sister    Heart attack Brother     Past Surgical History:  Procedure Laterality Date   arm surgery     DECORTICATION Right 06/03/2015   Procedure: DECORTICATION;  Surgeon: [name], MD;  Location: [facility] OR;  Service: Thoracic;  Laterality: Right;   VIDEO ASSISTED THORACOSCOPY (VATS)/EMPYEMA Right 06/03/2015   Procedure: VIDEO ASSISTED THORACOSCOPY (VATS)/EMPYEMA;  Surgeon: [name], MD;  Location: [facility] OR;  Service: Thoracic;  Laterality: Right;    ROS: Review of Systems Negative except as stated above  PHYSICAL EXAM: BP 120/79 (BP Location: Left Arm, Cuff Size: Normal)    Pulse 94    Temp 98.1 F (36.7 C) (Oral)    Resp 20    Wt 136 lb (61.7 kg)    SpO2 96%    BMI 20.08 kg/m   Physical Exam   General appearance - alert, well appearing, older Caucasian male and in no distress Mental status - alert, oriented to person, place, and time Neck - supple, no significant adenopathy Chest - clear to auscultation, no wheezes, rales or rhonchi, symmetric air entry Heart - normal rate, regular rhythm, normal S1, S2, no murmurs, rubs, clicks or gallops Extremities - peripheral pulses normal, no pedal edema, no clubbing or cyanosis  CMP Latest Ref Rng & Units 10/10/2020 10/26/2019 07/07/2018  Glucose 70 - 99 mg/dL 84 86 81  BUN 8 - 27 mg/dL 6(L) 4(L) 6  Creatinine 0.76 - 1.27 mg/dL 5.88(T) 2.54(D) 8.26(E)  Sodium 134 - 144 mmol/L 134 132(L) 140  Potassium 3.5 - 5.2 mmol/L 4.9 3.9 4.1  Chloride 96 - 106 mmol/L 96 95(L) 105  CO2 20 - 29 mmol/L 20 18(L) 18(L)  Calcium 8.6 - 10.2 mg/dL 9.7 9.1 9.0  Total Protein 6.0 - 8.5 g/dL 7.6 7.6 7.4  Total Bilirubin 0.0 - 1.2 mg/dL 0.4 0.7 0.3  Alkaline Phos 44 - 121 IU/L 87 170(H) 80  AST 0 - 40 IU/L 112(H) 108(H) 150(H)  ALT 0 - 44 IU/L 62(H) 43 64(H)   Lipid Panel     Component Value Date/Time   CHOL 153 10/10/2020 1650   TRIG 83 10/10/2020 1650   HDL 75 10/10/2020 1650    CHOLHDL 2.0 10/10/2020 1650   LDLCALC 62 10/10/2020 1650    CBC    Component Value Date/Time   WBC 6.8 10/10/2020 1650   WBC 4.9 06/16/2016 1715   RBC 3.74 (L) 10/10/2020 1650   RBC 4.12 (L) 06/16/2016 1715  HGB 13.1 10/10/2020 1650   HCT 37.2 (L) 10/10/2020 1650   PLT 192 10/10/2020 1650   MCV 100 (H) 10/10/2020 1650   MCH 35.0 (H) 10/10/2020 1650   MCH 34.0 06/16/2016 1715   MCHC 35.2 10/10/2020 1650   MCHC 33.3 06/16/2016 1715   RDW 12.8 10/10/2020 1650   LYMPHSABS 2.3 06/16/2016 1715   MONOABS 0.3 06/16/2016 1715   EOSABS 0.3 06/16/2016 1715   BASOSABS 0.1 06/16/2016 1715    ASSESSMENT AND PLAN: 1. Essential hypertension At goal.  We will change the amlodipine to 10 mg once a day. - amLODipine (NORVASC) 10 MG tablet; Take 1 tablet (10 mg total) by mouth daily.  Dispense: 30 tablet; Refill: 6  2. Tobacco abuse Continue to encourage smoking cessation given increased cardiovascular risks, known COPD and lung nodules.  3. ETOH abuse Commended him on cutting back some.  Challenged him to try to get down to one 40 ounce beer a day between now and the next visit.  Went over increased health risks associated with excessive alcohol use.  Advised that the increase liver enzymes are due to excessive alcohol use which over time can do permanent damage to the liver.  4. Abnormal LFTs See #3 above - Hepatic Function Panel; Future  5. Pulmonary emphysema, unspecified emphysema type (HCC) Encouraged to quit smoking.  He will keep his inhalers and use them as needed.  6. Aortic atherosclerosis (HCC) Discussed the significance of this.  Ideally I would put him on statin but thankfully his lipid profile is normal.  Also we cannot use statin because of elevated AST and ALT associated with heavy alcohol use.   Patient was given the opportunity to ask questions.  Patient verbalized understanding of the plan and was able to repeat key elements of the plan.   Orders Placed This  Encounter  Procedures   Hepatic Function Panel     Requested Prescriptions   Signed Prescriptions Disp Refills   amLODipine (NORVASC) 10 MG tablet 30 tablet 6    Sig: Take 1 tablet (10 mg total) by mouth daily.    Return in about 4 months (around 06/10/2021) for Give lab appt for next week.  Jonah Blue, MD, FACP

## 2021-02-10 NOTE — Progress Notes (Signed)
HTN f/u  

## 2021-02-13 ENCOUNTER — Other Ambulatory Visit: Payer: Self-pay

## 2021-02-17 ENCOUNTER — Other Ambulatory Visit: Payer: Self-pay

## 2021-03-08 DIAGNOSIS — Z419 Encounter for procedure for purposes other than remedying health state, unspecified: Secondary | ICD-10-CM | POA: Diagnosis not present

## 2021-04-08 DIAGNOSIS — Z419 Encounter for procedure for purposes other than remedying health state, unspecified: Secondary | ICD-10-CM | POA: Diagnosis not present

## 2021-05-08 DIAGNOSIS — Z419 Encounter for procedure for purposes other than remedying health state, unspecified: Secondary | ICD-10-CM | POA: Diagnosis not present

## 2021-05-18 DIAGNOSIS — R32 Unspecified urinary incontinence: Secondary | ICD-10-CM | POA: Diagnosis not present

## 2021-06-08 DIAGNOSIS — Z419 Encounter for procedure for purposes other than remedying health state, unspecified: Secondary | ICD-10-CM | POA: Diagnosis not present

## 2021-06-12 ENCOUNTER — Ambulatory Visit: Payer: Self-pay | Admitting: *Deleted

## 2021-06-12 NOTE — Telephone Encounter (Signed)
  Chief Complaint: patient's brother called with patient to report hearing loss Symptoms: needs to yell before patient can hear. Left worse than right . Ringing in ears. No pain. Hx build up of wax. Requesting refills of medications  Frequency: greater than a month  Pertinent Negatives: Patient denies ear pain , no fever, no chest pain or difficulty breathing  Disposition: [] ED /[] Urgent Care (no appt availability in office) / [x] Appointment(In office/virtual)/ []  [place] [facility]/ [] Home Care/ [] Refused Recommended Disposition /[] [place] [facility]/ [x]  Follow-up with PCP Additional Notes:   Earliest appt 07/06/21.   Please advise if earlier appt available . Can call patient or brother DPR. Patient on wait list .  Reason for Disposition  [1] Tinnitus (ringing, hissing, beating) AND [2] only or mainly in 1 ear  Answer Assessment - Initial Assessment Questions 1. DESCRIPTION: "What type of hearing problem are you having? Describe it for me." (e.g., complete hearing loss, partial loss)     Partial hearing loss patient's brother reports he yells at patient before he can hear 2. LOCATION: "One or both ears?" If one, ask: "Which ear?"     Both ears 3. SEVERITY: "Can you hear anything?" If Yes, ask: "What can you hear?" (e.g., ticking watch, whisper, talking)   - MILD:  Difficulty hearing soft speech, quiet library sounds, or speech from a distance or over background noise.   - MODERATE: Difficulty hearing normal speech even at closed distances.   - SEVERE: Unable to hear most normal conversation and talking; only able to hear loud sounds such as an alarm clock.     severe 4. ONSET: "When did this begin?" "Did it start suddenly or come on gradually?"     Greater than a month  5. PATTERN: "Does this come and go, or has it been constant since it started?"     Constant  6. PAIN: "Is there any pain in your ear(s)?"  (Scale 1-10; or mild, moderate, severe)   - NONE (0): no pain   - MILD  (1-3): doesn't interfere with normal activities    - MODERATE (4-7): interferes with normal activities or awakens from sleep    - SEVERE (8-10): excruciating pain, unable to do any normal activities      No  7. CAUSE: "What do you think is causing this hearing problem?"     Not sure, hx build up of earwax 8. OTHER SYMPTOMS: "Do you have any other symptoms?" (e.g., dizziness, ringing in ears)     Ringing in ears , left ear worse than right 9. PREGNANCY: "Is there any chance you are pregnant?" "When was your last menstrual period?"     na  Protocols used: Hearing Loss or Change-A-AH

## 2021-06-12 NOTE — Telephone Encounter (Signed)
Noted  

## 2021-07-06 ENCOUNTER — Ambulatory Visit: Payer: Medicaid Other | Attending: Physician Assistant | Admitting: Physician Assistant

## 2021-07-06 ENCOUNTER — Other Ambulatory Visit: Payer: Self-pay

## 2021-07-06 ENCOUNTER — Other Ambulatory Visit: Payer: Self-pay | Admitting: Pharmacist

## 2021-07-06 ENCOUNTER — Encounter: Payer: Self-pay | Admitting: Physician Assistant

## 2021-07-06 VITALS — BP 146/96 | HR 82 | Temp 98.1°F | Resp 16 | Wt 129.0 lb

## 2021-07-06 DIAGNOSIS — F1010 Alcohol abuse, uncomplicated: Secondary | ICD-10-CM | POA: Diagnosis not present

## 2021-07-06 DIAGNOSIS — I10 Essential (primary) hypertension: Secondary | ICD-10-CM

## 2021-07-06 DIAGNOSIS — H6122 Impacted cerumen, left ear: Secondary | ICD-10-CM | POA: Diagnosis not present

## 2021-07-06 DIAGNOSIS — J439 Emphysema, unspecified: Secondary | ICD-10-CM

## 2021-07-06 DIAGNOSIS — H6123 Impacted cerumen, bilateral: Secondary | ICD-10-CM | POA: Diagnosis not present

## 2021-07-06 MED ORDER — ALBUTEROL SULFATE (2.5 MG/3ML) 0.083% IN NEBU
2.5000 mg | INHALATION_SOLUTION | Freq: Four times a day (QID) | RESPIRATORY_TRACT | 2 refills | Status: AC | PRN
  Filled 2021-07-06: qty 90, 8d supply, fill #0
  Filled 2021-09-27 – 2021-11-03 (×2): qty 75, 7d supply, fill #1

## 2021-07-06 MED ORDER — DEBROX 6.5 % OT SOLN
5.0000 [drp] | Freq: Once | OTIC | 0 refills | Status: AC
Start: 2021-07-06 — End: 2021-07-07
  Filled 2021-07-06: qty 15, 60d supply, fill #0

## 2021-07-06 NOTE — Progress Notes (Signed)
Ears ringing  Denies pain

## 2021-07-06 NOTE — Patient Instructions (Signed)
[email or web address] is the website for narcotics anonymous [email or web address] (website) or [phone] is the information for alcoholics anonymous Both are free and immediately available for help with alcohol and drug use  

## 2021-07-06 NOTE — Progress Notes (Signed)
Patient ID: Brent Warren, male   DOB: 1960/11/02, 61 y.o.   MRN: [id number]   Brent Warren, is a 61 y.o. male  [id number]  [id number]  DOB - Jun 09, 1960  Chief Complaint  Patient presents with   Tinnitus       Subjective:   Brent Warren is a 61 y.o. male here today for B ear congestion and ears ringing at times.  H/o cerumen impaction.  Did not take meds today.  Still drinking alcohol.  His brother is here with him today.   No problems updated.  ALLERGIES: No Known Allergies  PAST MEDICAL HISTORY: Past Medical History:  Diagnosis Date   Alcoholic (HCC)    Chest pain 04/02/2017   COPD (chronic obstructive pulmonary disease) (HCC)    Empyema of pleural space (HCC) 05/2015   ETOH abuse 11/06/2016   Hepatitis 05/22/2015   HTN (hypertension)    Immunization due 11/06/2016   Lung nodule, multiple 11/08/2015   Malnutrition of moderate degree 06/02/2015   Pneumonia 05/2015   Pulmonary emphysema (HCC) 11/06/2016   Tobacco abuse    Urge incontinence 11/06/2016    MEDICATIONS AT HOME: Prior to Admission medications   Medication Sig Start Date End Date Taking? Authorizing Provider  albuterol (VENTOLIN HFA) 108 (90 Base) MCG/ACT inhaler Inhale 2 puffs into the lungs every 6 (six) hours as needed for wheezing or shortness of breath. 10/10/20  Yes Marcine Matar, MD  amLODipine (NORVASC) 10 MG tablet Take 1 tablet (10 mg total) by mouth daily. 02/10/21  Yes Marcine Matar, MD  budesonide-formoterol [facility]) 80-4.5 MCG/ACT inhaler Inhale 2 puffs into the lungs 2 (two) times daily 10/10/20  Yes Marcine Matar, MD  loperamide (IMODIUM A-D) 2 MG tablet Take 1 tablet (2 mg total) by mouth 2 (two) times daily as needed for diarrhea or loose stools. 10/06/18  Yes Marcine Matar, MD  meloxicam (MOBIC) 15 MG tablet Take 1 tablet (15 mg total) by mouth daily. 10/21/20  Yes Marcine Matar, MD  albuterol (PROVENTIL) (2.5 MG/3ML) 0.083% nebulizer solution Take 3 mLs (2.5 mg total)  by nebulization every 6 (six) hours as needed for wheezing or shortness of breath. 07/06/21   Marcine Matar, MD    ROS: Neg resp Neg cardiac Neg GI Neg GU Neg MS Neg psych Neg neuro  Objective:   Vitals:   07/06/21 1125  BP: (!) 146/96  Pulse: 82  Resp: 16  Temp: 98.1 F (36.7 C)  TempSrc: Oral  SpO2: 93%  Weight: 129 lb (58.5 kg)   Exam General appearance : Awake, alert, not in any distress. Speech Clear. Not toxic looking;  appears older than stated age HEENT: Atraumatic and Normocephalic. B cerumen impaction.   Neck: Supple, no JVD. No cervical lymphadenopathy.  Chest: Good air entry bilaterally, CTAB.  No rales/rhonchi/wheezing CVS: S1 S2 regular, no murmurs.  Extremities: B/L Lower Ext shows no edema, both legs are warm to touch Neurology: Awake alert, and oriented X 3, CN II-XII intact, Non focal Skin: No Rash  Data Review Lab Results  Component Value Date   HGBA1C 5.3 09/12/2017   HGBA1C 5.2 02/07/2016    Assessment & Plan   1. Bilateral impacted cerumen Softening drops X 15 mins then irrigation by Guy Franco RN.  Resolution On L.  I will send him drops to use on the R the night prior to his next appt.    2. Essential hypertension Not controlled-did not take meds today.  Take meds.  Eliminate alcohol.  Check BP daily and record and bring to next visit.    3. ETOH abuse I have counseled the patient at length about substance abuse and addiction.  12 step meetings/recovery recommended.  Local 12 step meeting lists were given and attendance was encouraged.  Patient expresses understanding.   [employer] is the website for narcotics anonymous  [employer] (website) or 352-628-3004 is the information for alcoholics anonymous  Both are free and immediately available for help with alcohol and drug use     Return in about 6 weeks (around 08/17/2021) for PCP for chronic conditions.  The patient was given clear instructions to go to ER or return  to medical center if symptoms don't improve, worsen or new problems develop. The patient verbalized understanding. The patient was told to call to get lab results if they haven't heard anything in the next week.      Georgian Co, PA-C [facility] and [facility] [place], [place] [phone]   07/06/2021, 12:25 PM

## 2021-07-08 DIAGNOSIS — Z419 Encounter for procedure for purposes other than remedying health state, unspecified: Secondary | ICD-10-CM | POA: Diagnosis not present

## 2021-07-25 DIAGNOSIS — R32 Unspecified urinary incontinence: Secondary | ICD-10-CM | POA: Diagnosis not present

## 2021-08-08 DIAGNOSIS — Z419 Encounter for procedure for purposes other than remedying health state, unspecified: Secondary | ICD-10-CM | POA: Diagnosis not present

## 2021-09-05 DIAGNOSIS — R32 Unspecified urinary incontinence: Secondary | ICD-10-CM | POA: Diagnosis not present

## 2021-09-08 DIAGNOSIS — Z419 Encounter for procedure for purposes other than remedying health state, unspecified: Secondary | ICD-10-CM | POA: Diagnosis not present

## 2021-09-27 ENCOUNTER — Other Ambulatory Visit: Payer: Self-pay

## 2021-10-03 ENCOUNTER — Other Ambulatory Visit: Payer: Self-pay

## 2021-10-04 ENCOUNTER — Other Ambulatory Visit: Payer: Self-pay

## 2021-10-08 DIAGNOSIS — Z419 Encounter for procedure for purposes other than remedying health state, unspecified: Secondary | ICD-10-CM | POA: Diagnosis not present

## 2021-10-27 DIAGNOSIS — R32 Unspecified urinary incontinence: Secondary | ICD-10-CM | POA: Diagnosis not present

## 2021-11-03 ENCOUNTER — Other Ambulatory Visit: Payer: Self-pay | Admitting: Internal Medicine

## 2021-11-03 ENCOUNTER — Other Ambulatory Visit: Payer: Self-pay

## 2021-11-03 DIAGNOSIS — J439 Emphysema, unspecified: Secondary | ICD-10-CM

## 2021-11-03 MED ORDER — BUDESONIDE-FORMOTEROL FUMARATE 80-4.5 MCG/ACT IN AERO
2.0000 | INHALATION_SPRAY | Freq: Two times a day (BID) | RESPIRATORY_TRACT | 0 refills | Status: DC
  Filled 2021-11-03: qty 10.2, 30d supply, fill #0

## 2021-11-03 MED ORDER — MELOXICAM 15 MG PO TABS
15.0000 mg | ORAL_TABLET | Freq: Every day | ORAL | 0 refills | Status: DC
  Filled 2021-11-03: qty 30, 30d supply, fill #0

## 2021-11-03 MED ORDER — ALBUTEROL SULFATE HFA 108 (90 BASE) MCG/ACT IN AERS
2.0000 | INHALATION_SPRAY | Freq: Four times a day (QID) | RESPIRATORY_TRACT | 0 refills | Status: DC | PRN
  Filled 2021-11-03: qty 18, 25d supply, fill #0

## 2021-11-08 DIAGNOSIS — Z419 Encounter for procedure for purposes other than remedying health state, unspecified: Secondary | ICD-10-CM | POA: Diagnosis not present

## 2021-11-18 ENCOUNTER — Inpatient Hospital Stay (HOSPITAL_COMMUNITY): Payer: Medicaid Other

## 2021-11-18 ENCOUNTER — Encounter (HOSPITAL_COMMUNITY): Payer: Self-pay | Admitting: Neurology

## 2021-11-18 ENCOUNTER — Inpatient Hospital Stay (HOSPITAL_COMMUNITY)
Admission: EM | Admit: 2021-11-18 | Discharge: 2021-12-28 | DRG: 064 | Disposition: A | Discharge status: Skilled Nursing Facility | Payer: Medicaid Other | Attending: Internal Medicine | Admitting: Internal Medicine

## 2021-11-18 ENCOUNTER — Other Ambulatory Visit: Payer: Self-pay

## 2021-11-18 ENCOUNTER — Emergency Department (HOSPITAL_COMMUNITY): Payer: Medicaid Other

## 2021-11-18 DIAGNOSIS — Z781 Physical restraint status: Secondary | ICD-10-CM | POA: Diagnosis not present

## 2021-11-18 DIAGNOSIS — I6389 Other cerebral infarction: Secondary | ICD-10-CM | POA: Diagnosis not present

## 2021-11-18 DIAGNOSIS — E43 Unspecified severe protein-calorie malnutrition: Secondary | ICD-10-CM | POA: Diagnosis not present

## 2021-11-18 DIAGNOSIS — I610 Nontraumatic intracerebral hemorrhage in hemisphere, subcortical: Secondary | ICD-10-CM | POA: Diagnosis not present

## 2021-11-18 DIAGNOSIS — R531 Weakness: Secondary | ICD-10-CM | POA: Diagnosis not present

## 2021-11-18 DIAGNOSIS — S022XXA Fracture of nasal bones, initial encounter for closed fracture: Secondary | ICD-10-CM | POA: Diagnosis not present

## 2021-11-18 DIAGNOSIS — R29818 Other symptoms and signs involving the nervous system: Secondary | ICD-10-CM | POA: Diagnosis not present

## 2021-11-18 DIAGNOSIS — D519 Vitamin B12 deficiency anemia, unspecified: Secondary | ICD-10-CM | POA: Diagnosis present

## 2021-11-18 DIAGNOSIS — F10231 Alcohol dependence with withdrawal delirium: Secondary | ICD-10-CM | POA: Diagnosis not present

## 2021-11-18 DIAGNOSIS — I471 Supraventricular tachycardia, unspecified: Secondary | ICD-10-CM | POA: Diagnosis not present

## 2021-11-18 DIAGNOSIS — E871 Hypo-osmolality and hyponatremia: Secondary | ICD-10-CM | POA: Diagnosis present

## 2021-11-18 DIAGNOSIS — R4587 Impulsiveness: Secondary | ICD-10-CM | POA: Diagnosis not present

## 2021-11-18 DIAGNOSIS — J439 Emphysema, unspecified: Secondary | ICD-10-CM | POA: Diagnosis not present

## 2021-11-18 DIAGNOSIS — D7589 Other specified diseases of blood and blood-forming organs: Secondary | ICD-10-CM | POA: Diagnosis present

## 2021-11-18 DIAGNOSIS — Z823 Family history of stroke: Secondary | ICD-10-CM

## 2021-11-18 DIAGNOSIS — I959 Hypotension, unspecified: Secondary | ICD-10-CM | POA: Diagnosis not present

## 2021-11-18 DIAGNOSIS — G8191 Hemiplegia, unspecified affecting right dominant side: Secondary | ICD-10-CM | POA: Diagnosis not present

## 2021-11-18 DIAGNOSIS — Z8249 Family history of ischemic heart disease and other diseases of the circulatory system: Secondary | ICD-10-CM

## 2021-11-18 DIAGNOSIS — E876 Hypokalemia: Secondary | ICD-10-CM | POA: Diagnosis present

## 2021-11-18 DIAGNOSIS — F1010 Alcohol abuse, uncomplicated: Secondary | ICD-10-CM | POA: Diagnosis present

## 2021-11-18 DIAGNOSIS — I161 Hypertensive emergency: Secondary | ICD-10-CM | POA: Diagnosis not present

## 2021-11-18 DIAGNOSIS — G319 Degenerative disease of nervous system, unspecified: Secondary | ICD-10-CM | POA: Diagnosis not present

## 2021-11-18 DIAGNOSIS — R509 Fever, unspecified: Secondary | ICD-10-CM | POA: Diagnosis not present

## 2021-11-18 DIAGNOSIS — I619 Nontraumatic intracerebral hemorrhage, unspecified: Secondary | ICD-10-CM | POA: Diagnosis present

## 2021-11-18 DIAGNOSIS — Z681 Body mass index (BMI) 19 or less, adult: Secondary | ICD-10-CM

## 2021-11-18 DIAGNOSIS — Z419 Encounter for procedure for purposes other than remedying health state, unspecified: Secondary | ICD-10-CM | POA: Diagnosis not present

## 2021-11-18 DIAGNOSIS — D539 Nutritional anemia, unspecified: Secondary | ICD-10-CM | POA: Diagnosis present

## 2021-11-18 DIAGNOSIS — I10 Essential (primary) hypertension: Secondary | ICD-10-CM | POA: Diagnosis present

## 2021-11-18 DIAGNOSIS — R443 Hallucinations, unspecified: Secondary | ICD-10-CM | POA: Diagnosis not present

## 2021-11-18 DIAGNOSIS — R1312 Dysphagia, oropharyngeal phase: Secondary | ICD-10-CM | POA: Diagnosis present

## 2021-11-18 DIAGNOSIS — N390 Urinary tract infection, site not specified: Secondary | ICD-10-CM | POA: Diagnosis not present

## 2021-11-18 DIAGNOSIS — R7989 Other specified abnormal findings of blood chemistry: Secondary | ICD-10-CM | POA: Diagnosis present

## 2021-11-18 DIAGNOSIS — R1084 Generalized abdominal pain: Secondary | ICD-10-CM | POA: Diagnosis not present

## 2021-11-18 DIAGNOSIS — R1310 Dysphagia, unspecified: Secondary | ICD-10-CM | POA: Diagnosis not present

## 2021-11-18 DIAGNOSIS — R0689 Other abnormalities of breathing: Secondary | ICD-10-CM | POA: Diagnosis not present

## 2021-11-18 DIAGNOSIS — G9340 Encephalopathy, unspecified: Secondary | ICD-10-CM | POA: Diagnosis present

## 2021-11-18 DIAGNOSIS — J690 Pneumonitis due to inhalation of food and vomit: Secondary | ICD-10-CM | POA: Diagnosis not present

## 2021-11-18 DIAGNOSIS — F17200 Nicotine dependence, unspecified, uncomplicated: Secondary | ICD-10-CM | POA: Diagnosis not present

## 2021-11-18 DIAGNOSIS — I639 Cerebral infarction, unspecified: Secondary | ICD-10-CM | POA: Diagnosis not present

## 2021-11-18 DIAGNOSIS — F1290 Cannabis use, unspecified, uncomplicated: Secondary | ICD-10-CM | POA: Diagnosis present

## 2021-11-18 DIAGNOSIS — Z720 Tobacco use: Secondary | ICD-10-CM | POA: Diagnosis not present

## 2021-11-18 DIAGNOSIS — R451 Restlessness and agitation: Secondary | ICD-10-CM | POA: Diagnosis not present

## 2021-11-18 DIAGNOSIS — R4781 Slurred speech: Secondary | ICD-10-CM | POA: Diagnosis not present

## 2021-11-18 DIAGNOSIS — Z743 Need for continuous supervision: Secondary | ICD-10-CM | POA: Diagnosis not present

## 2021-11-18 DIAGNOSIS — Z79899 Other long term (current) drug therapy: Secondary | ICD-10-CM | POA: Diagnosis not present

## 2021-11-18 DIAGNOSIS — R718 Other abnormality of red blood cells: Secondary | ICD-10-CM | POA: Insufficient documentation

## 2021-11-18 DIAGNOSIS — F10931 Alcohol use, unspecified with withdrawal delirium: Secondary | ICD-10-CM | POA: Insufficient documentation

## 2021-11-18 DIAGNOSIS — R29706 NIHSS score 6: Secondary | ICD-10-CM | POA: Diagnosis present

## 2021-11-18 DIAGNOSIS — R29810 Facial weakness: Secondary | ICD-10-CM | POA: Diagnosis present

## 2021-11-18 DIAGNOSIS — Z7951 Long term (current) use of inhaled steroids: Secondary | ICD-10-CM

## 2021-11-18 DIAGNOSIS — R4182 Altered mental status, unspecified: Secondary | ICD-10-CM | POA: Diagnosis not present

## 2021-11-18 DIAGNOSIS — F17210 Nicotine dependence, cigarettes, uncomplicated: Secondary | ICD-10-CM | POA: Diagnosis present

## 2021-11-18 DIAGNOSIS — R471 Dysarthria and anarthria: Secondary | ICD-10-CM | POA: Diagnosis present

## 2021-11-18 LAB — DIFFERENTIAL
Abs Immature Granulocytes: 0.02 10*3/uL (ref 0.00–0.07)
Basophils Absolute: 0.1 10*3/uL (ref 0.0–0.1)
Basophils Relative: 2 %
Eosinophils Absolute: 0.2 10*3/uL (ref 0.0–0.5)
Eosinophils Relative: 2 %
Immature Granulocytes: 0 %
Lymphocytes Relative: 30 %
Lymphs Abs: 2.3 10*3/uL (ref 0.7–4.0)
Monocytes Absolute: 0.7 10*3/uL (ref 0.1–1.0)
Monocytes Relative: 9 %
Neutro Abs: 4.3 10*3/uL (ref 1.7–7.7)
Neutrophils Relative %: 57 %

## 2021-11-18 LAB — COMPREHENSIVE METABOLIC PANEL
ALT: 38 U/L (ref 0–44)
AST: 76 U/L — ABNORMAL HIGH (ref 15–41)
Albumin: 4 g/dL (ref 3.5–5.0)
Alkaline Phosphatase: 58 U/L (ref 38–126)
Anion gap: 15 (ref 5–15)
BUN: <5 mg/dL — ABNORMAL LOW (ref 8–23)
CO2: 21 mmol/L — ABNORMAL LOW (ref 22–32)
Calcium: 9.1 mg/dL (ref 8.9–10.3)
Chloride: 96 mmol/L — ABNORMAL LOW (ref 98–111)
Creatinine, Ser: 0.59 mg/dL — ABNORMAL LOW (ref 0.61–1.24)
GFR, Estimated: >60 mL/min (ref >60)
Glucose, Bld: 93 mg/dL (ref 70–99)
Potassium: 4.2 mmol/L (ref 3.5–5.1)
Sodium: 132 mmol/L — ABNORMAL LOW (ref 135–145)
Total Bilirubin: 0.5 mg/dL (ref 0.3–1.2)
Total Protein: 8 g/dL (ref 6.5–8.1)

## 2021-11-18 LAB — CBC
HCT: 38.8 % — ABNORMAL LOW (ref 39.0–52.0)
Hemoglobin: 13.6 g/dL (ref 13.0–17.0)
MCH: 36.7 pg — ABNORMAL HIGH (ref 26.0–34.0)
MCHC: 35.1 g/dL (ref 30.0–36.0)
MCV: 104.6 fL — ABNORMAL HIGH (ref 80.0–100.0)
Platelets: 224 10*3/uL (ref 150–400)
RBC: 3.71 MIL/uL — ABNORMAL LOW (ref 4.22–5.81)
RDW: 13.2 % (ref 11.5–15.5)
WBC: 7.5 10*3/uL (ref 4.0–10.5)
nRBC: 0 % (ref 0.0–0.2)

## 2021-11-18 LAB — I-STAT CHEM 8, ED
BUN: 3 mg/dL — ABNORMAL LOW (ref 8–23)
Calcium, Ion: 1.08 mmol/L — ABNORMAL LOW (ref 1.15–1.40)
Chloride: 97 mmol/L — ABNORMAL LOW (ref 98–111)
Creatinine, Ser: 0.8 mg/dL (ref 0.61–1.24)
Glucose, Bld: 88 mg/dL (ref 70–99)
HCT: 44 % (ref 39.0–52.0)
Hemoglobin: 15 g/dL (ref 13.0–17.0)
Potassium: 4.3 mmol/L (ref 3.5–5.1)
Sodium: 131 mmol/L — ABNORMAL LOW (ref 135–145)
TCO2: 22 mmol/L (ref 22–32)

## 2021-11-18 LAB — APTT: aPTT: 27 seconds (ref 24–36)

## 2021-11-18 LAB — ETHANOL: Alcohol, Ethyl (B): 201 mg/dL — ABNORMAL HIGH (ref <10)

## 2021-11-18 LAB — CBG MONITORING, ED: Glucose-Capillary: 95 mg/dL (ref 70–99)

## 2021-11-18 LAB — MRSA NEXT GEN BY PCR, NASAL: MRSA by PCR Next Gen: NOT DETECTED

## 2021-11-18 LAB — HIV ANTIBODY (ROUTINE TESTING W REFLEX): HIV Screen 4th Generation wRfx: NONREACTIVE

## 2021-11-18 LAB — PROTIME-INR
INR: 1 (ref 0.8–1.2)
Prothrombin Time: 12.7 seconds (ref 11.4–15.2)

## 2021-11-18 MED ORDER — SODIUM CHLORIDE 0.9% FLUSH: 3.0000 mL | Freq: Once | INTRAVENOUS | Status: DC

## 2021-11-18 MED ORDER — GADOBUTROL 1 MMOL/ML IV SOLN
6.0000 mL | Freq: Once | INTRAVENOUS | Status: AC | PRN
  Administered 2021-11-18: 6 mL via INTRAVENOUS

## 2021-11-18 MED ORDER — LORAZEPAM 2 MG/ML IJ SOLN
1.0000 mg | INTRAMUSCULAR | Status: AC | PRN
  Administered 2021-11-19: 1 mg via INTRAVENOUS
  Administered 2021-11-20 (×2): 2 mg via INTRAVENOUS
  Filled 2021-11-18 (×3): qty 1

## 2021-11-18 MED ORDER — CHLORHEXIDINE GLUCONATE CLOTH 2 % EX PADS
6.0000 | MEDICATED_PAD | Freq: Every day | CUTANEOUS | Status: DC
  Administered 2021-11-18 – 2021-11-26 (×9): 6 via TOPICAL

## 2021-11-18 MED ORDER — ACETAMINOPHEN 650 MG RE SUPP: 650.0000 mg | RECTAL | Status: DC | PRN

## 2021-11-18 MED ORDER — FOLIC ACID 1 MG PO TABS
1.0000 mg | ORAL_TABLET | Freq: Every day | ORAL | Status: DC
  Administered 2021-11-19: 1 mg via ORAL
  Filled 2021-11-18: qty 1

## 2021-11-18 MED ORDER — PANTOPRAZOLE SODIUM 40 MG IV SOLR
40.0000 mg | Freq: Every day | INTRAVENOUS | Status: DC
  Administered 2021-11-18: 40 mg via INTRAVENOUS
  Filled 2021-11-18: qty 10

## 2021-11-18 MED ORDER — ADULT MULTIVITAMIN W/MINERALS CH
1.0000 | ORAL_TABLET | Freq: Every day | ORAL | Status: DC
  Administered 2021-11-19: 1 via ORAL
  Filled 2021-11-18: qty 1

## 2021-11-18 MED ORDER — CLEVIDIPINE BUTYRATE 0.5 MG/ML IV EMUL
0.0000 mg/h | INTRAVENOUS | Status: DC
  Administered 2021-11-18: 2 mg/h via INTRAVENOUS

## 2021-11-18 MED ORDER — THIAMINE HCL 100 MG/ML IJ SOLN
100.0000 mg | Freq: Every day | INTRAMUSCULAR | Status: DC
  Administered 2021-11-20 – 2021-11-22 (×3): 100 mg via INTRAVENOUS
  Filled 2021-11-18 (×3): qty 2

## 2021-11-18 MED ORDER — ACETAMINOPHEN 325 MG PO TABS: 650.0000 mg | ORAL_TABLET | ORAL | Status: DC | PRN

## 2021-11-18 MED ORDER — LORAZEPAM 1 MG PO TABS: 1.0000 mg | ORAL_TABLET | ORAL | Status: AC | PRN

## 2021-11-18 MED ORDER — LORAZEPAM 2 MG/ML IJ SOLN
0.0000 mg | Freq: Three times a day (TID) | INTRAMUSCULAR | Status: DC
  Administered 2021-11-20: 2 mg via INTRAVENOUS
  Administered 2021-11-20: 3 mg via INTRAVENOUS
  Administered 2021-11-21: 4 mg via INTRAVENOUS
  Filled 2021-11-18: qty 1
  Filled 2021-11-18 (×2): qty 2

## 2021-11-18 MED ORDER — LABETALOL HCL 5 MG/ML IV SOLN
10.0000 mg | INTRAVENOUS | Status: DC | PRN
  Administered 2021-11-18: 10 mg via INTRAVENOUS
  Filled 2021-11-18: qty 4

## 2021-11-18 MED ORDER — ACETAMINOPHEN 160 MG/5ML PO SOLN
650.0000 mg | ORAL | Status: DC | PRN
  Administered 2021-11-22: 650 mg
  Filled 2021-11-18: qty 20.3

## 2021-11-18 MED ORDER — IOHEXOL 350 MG/ML SOLN
75.0000 mL | Freq: Once | INTRAVENOUS | Status: AC | PRN
  Administered 2021-11-18: 75 mL via INTRAVENOUS

## 2021-11-18 MED ORDER — SENNOSIDES-DOCUSATE SODIUM 8.6-50 MG PO TABS: 1.0000 | ORAL_TABLET | Freq: Two times a day (BID) | ORAL | Status: DC

## 2021-11-18 MED ORDER — THIAMINE MONONITRATE 100 MG PO TABS
100.0000 mg | ORAL_TABLET | Freq: Every day | ORAL | Status: DC
  Administered 2021-11-19: 100 mg via ORAL
  Filled 2021-11-18: qty 1

## 2021-11-18 MED ORDER — LORAZEPAM 2 MG/ML IJ SOLN
0.0000 mg | INTRAMUSCULAR | Status: DC
  Administered 2021-11-19 – 2021-11-20 (×4): 2 mg via INTRAVENOUS
  Filled 2021-11-18 (×4): qty 1

## 2021-11-18 MED ORDER — STROKE: EARLY STAGES OF RECOVERY BOOK
Freq: Once | Status: AC
  Filled 2021-11-18: qty 1

## 2021-11-18 MED ORDER — TENECTEPLASE FOR STROKE
0.2500 mg/kg | PACK | Freq: Once | INTRAVENOUS | Status: DC
  Filled 2021-11-18: qty 10

## 2021-11-18 NOTE — Care Management (Signed)
SA resources added to AVS. Will get CAGE AIDE screening

## 2021-11-18 NOTE — H&P (Signed)
Neurology H&P  CC: Right-sided weakness and slurred speech  History is obtained from: Patient, family and chart  HPI: Brent Warren is a 61 y.o. male with history of hypertension, tobacco abuse, alcohol abuse and lung nodules who presents with sudden onset slurred speech right, right-sided numbness, right facial droop and right-sided weakness.  He was last known to be normal at 830 this morning and then suddenly developed right-sided weakness as well as slurred speech and right facial droop.  Patient had had 2 or 3 beers this morning.  EMS was called, and patient was brought to the hospital.  Head CT showed an [place] in the left basal ganglia.  Patient has a history of heavy alcohol abuse with usual intake of 6-10 beers per day.  ICH score 0  LKW: 0830 tpa given?: No, ICH IR Thrombectomy? No, ICH Modified Rankin Scale: 1-No significant post stroke disability and can perform usual duties with stroke symptoms  NIHSS:  1a Level of Conscious.: 0 1b LOC Questions: 0 1c LOC Commands: 0 2 Best Gaze: 0 3 Visual: 0 4 Facial Palsy: 1 5a Motor Arm - left: 0 5b Motor Arm - Right:3  6a Motor Leg - Left: 0 6b Motor Leg - Right: 1 7 Limb Ataxia: 0 8 Sensory: 0 9 Best Language: 0 10 Dysarthria: 1 11 Extinct and Inattention.: 0 TOTAL: 6      ROS: A complete ROS was performed and is negative except as noted in the HPI.   Past Medical History:  Diagnosis Date   Alcoholic ([place])    Chest pain 04/02/2017   COPD (chronic obstructive pulmonary disease) (HCC)    Empyema of pleural space ([place]) 05/2015   ETOH abuse 11/06/2016   Hepatitis 05/22/2015   HTN (hypertension)    Immunization due 11/06/2016   Lung nodule, multiple 11/08/2015   Malnutrition of moderate degree 06/02/2015   Pneumonia 05/2015   Pulmonary emphysema ([place]) 11/06/2016   Tobacco abuse    Urge incontinence 11/06/2016     Family History  Problem Relation Age of Onset   Heart attack Mother    Cirrhosis Father    Stroke Sister     Heart attack Brother      Social History:  reports that he has been smoking cigarettes. He started smoking about 6 years ago. He has a 47.00 pack-year smoking history. He has never used smokeless tobacco. He reports current alcohol use of about 2.0 standard drinks of alcohol per week. He reports that he does not use drugs.   Prior to Admission medications   Medication Sig Start Date End Date Taking? Authorizing Provider  albuterol (PROVENTIL) (2.5 MG/3ML) 0.083% nebulizer solution Take 3 mLs (2.5 mg total) by nebulization every 6 (six) hours as needed for wheezing or shortness of breath. 07/06/21   Ladell Pier, MD  albuterol (VENTOLIN HFA) 108 (90 Base) MCG/ACT inhaler Inhale 2 puffs into the lungs every 6 (six) hours as needed for wheezing or shortness of breath. 11/03/21   Ladell Pier, MD  amLODipine (NORVASC) 10 MG tablet Take 1 tablet (10 mg total) by mouth daily. 02/10/21   Ladell Pier, MD  budesonide-formoterol [facility]) 80-4.5 MCG/ACT inhaler Inhale 2 puffs into the lungs 2 (two) times daily 11/03/21   Ladell Pier, MD  loperamide (IMODIUM A-D) 2 MG tablet Take 1 tablet (2 mg total) by mouth 2 (two) times daily as needed for diarrhea or loose stools. 10/06/18   Ladell Pier, MD  meloxicam (MOBIC) 15 MG  tablet Take 1 tablet (15 mg total) by mouth daily. 11/03/21   Ladell Pier, MD     Exam: Current vital signs: BP (!) 156/101 (BP Location: Right Arm)   Pulse 83   Temp 98.2 F (36.8 C) (Oral)   Wt 59.2 kg   SpO2 100%   BMI 19.27 kg/m    Physical Exam  Constitutional: Well-developed and thin appearing Psych: Mood labile, tearful at times Eyes: No scleral injection HENT: No OP obstrucion Head: Normocephalic.  Cardiovascular: Normal rate and regular rhythm.   Skin: WDI  Neuro: Mental Status: Patient is awake, alert, oriented to person, place, month, year, and situation. Patient is not able to give a clear and coherent history, likely  due to recent alcohol ingestion No signs of aphasia or neglect, but speech is quite dysarthric Cranial Nerves: II: Visual Fields are full. Pupils are equal, round, and reactive to light.   III,IV, VI: EOMI without ptosis or diplopia.  V: Facial sensation is symmetric to light touch VII: Right facial droop VIII: Hearing is intact to voice X: Voice is dysarthric XI: Shoulder shrug is symmetric. XII: tongue is midline without atrophy or fasciculations.  Motor: Tone is normal. Bulk is normal. 5/5 strength was present left lower extremity and left upper extremity, 2 out of 5 strength in right upper extremity, 4 out of 5 strength in right lower extremity Sensory: Sensation is symmetric to light touch and temperature in the arms and legs. No extinction to DSS present.  Cerebellar: FNF intact on the left, unable to perform on the right and HKS are intact bilaterally   I have reviewed labs in epic and the pertinent results are:    Latest Ref Rng & Units 11/18/2021   11:34 AM 11/18/2021   11:30 AM 10/10/2020    4:50 PM  CBC  WBC 4.0 - 10.5 K/uL  7.5  6.8   Hemoglobin 13.0 - 17.0 g/dL 15.0  13.6  13.1   Hematocrit 39.0 - 52.0 % 44.0  38.8  37.2   Platelets 150 - 400 K/uL  224  192        Latest Ref Rng & Units 11/18/2021   11:34 AM 10/10/2020    4:50 PM 10/26/2019    5:06 PM  BMP  Glucose 70 - 99 mg/dL 88  84  86   BUN 8 - 23 mg/dL 3  6  4    Creatinine 0.61 - 1.24 mg/dL 0.80  0.73  0.72   BUN/Creat Ratio 10 - 24  8  6    Sodium 135 - 145 mmol/L 131  134  132   Potassium 3.5 - 5.1 mmol/L 4.3  4.9  3.9   Chloride 98 - 111 mmol/L 97  96  95   CO2 20 - 29 mmol/L  20  18   Calcium 8.6 - 10.2 mg/dL  9.7  9.1      I have reviewed the images obtained:  CT head: 4 mm acute left basal ganglia hemorrhage, chronic microvascular disease and atrophy   Impression: Left basal ganglia ICH in patient with hypertension  Recommendations: -Follow-up CT scan in 6 hours for stability -MRI  brain -Lipid panel and [id number] -Keep systolic blood pressure between 130 and 150, use Cleviprex if necessary -Admit to ICU -Neurochecks with NIH every 60 minutes -CIWA protocol given heavy alcohol abuse -PT/OT/SLP -Echocardiogram   This patient is critically ill and at significant risk of neurological worsening, death and care requires constant monitoring of vital signs,  hemodynamics,respiratory and cardiac monitoring, neurological assessment, discussion with family, other specialists and medical decision making of high complexity. I spent 55 minutes of neurocritical care time  in the care of  this patient. This was time spent independent of any time provided by nurse practitioner or PA.  Pt seen by NP/Neuro and later by MD. Note/plan to be edited by MD as needed.  Cortney E Ernestina Columbia , MSN, AGACNP-BC [facility] See Amion for schedule and pager information 11/18/2021 12:16 PM   NEUROHOSPITALIST ADDENDUM Performed a face to face diagnostic evaluation.   I have reviewed the contents of history and physical exam as documented by PA/ARNP/Resident and agree with above documentation.  I have discussed and formulated the above plan as documented. Edits to the note have been made as needed.  Impression/Key exam findings/Plan: 54M hx of EtOH use, HTN, tobacco use, lung nodules who p/w R sided weakness. Found to have a 65mm right BG hemorrhage with ICH score of 0. No IV extension of the hemorrhage. Keep SBP between 130-150. Admit to ICU, stability scan in 6 hours. No spot sign, no AVM noted on CT Angio.  Erick Blinks, MD [facility] [phone]   If 7pm to 7am, please call on call as listed on AMION.

## 2021-11-18 NOTE — ED Triage Notes (Signed)
Pt BIB EMS due to a code stroke. Pt was LSN at 0830 today and had right sided weakness/ numbness, facial droop, and slurred speech. Pt drinks 6-10 beers a day and states he had 2-3 this morning. Pt is axox4 on arrival. Pt was hypertensive with EMS.

## 2021-11-18 NOTE — ED Provider Notes (Signed)
[facility] EMERGENCY DEPARTMENT Provider Note   CSN: [id number] Arrival date & time: 11/18/21  1127     History  Chief Complaint  Patient presents with   Code Stroke    Brent Warren is a 61 y.o. male.  HPI Patient presents for strokelike symptoms.  Medical history includes HTN, COPD, alcohol abuse.  Patient states that he woke up in the state of health.  Later in the morning, he developed right-sided weakness, facial droop, slurred speech.  Symptoms continue on his arrival in the ED.  Patient currently denies any areas of numbness.  He does feel weak in his right arm and leg.  His speech seems different to him.  He denies headache.  He does continue to drink alcohol.  Last alcoholic drink was this morning.    Home Medications Prior to Admission medications   Medication Sig Start Date End Date Taking? Authorizing Provider  albuterol (PROVENTIL) (2.5 MG/3ML) 0.083% nebulizer solution Take 3 mLs (2.5 mg total) by nebulization every 6 (six) hours as needed for wheezing or shortness of breath. 07/06/21   Marcine Matar, MD  albuterol (VENTOLIN HFA) 108 (90 Base) MCG/ACT inhaler Inhale 2 puffs into the lungs every 6 (six) hours as needed for wheezing or shortness of breath. 11/03/21   Marcine Matar, MD  amLODipine (NORVASC) 10 MG tablet Take 1 tablet (10 mg total) by mouth daily. 02/10/21   Marcine Matar, MD  budesonide-formoterol [facility]) 80-4.5 MCG/ACT inhaler Inhale 2 puffs into the lungs 2 (two) times daily 11/03/21   Marcine Matar, MD  loperamide (IMODIUM A-D) 2 MG tablet Take 1 tablet (2 mg total) by mouth 2 (two) times daily as needed for diarrhea or loose stools. 10/06/18   Marcine Matar, MD  meloxicam (MOBIC) 15 MG tablet Take 1 tablet (15 mg total) by mouth daily. 11/03/21   Marcine Matar, MD      Allergies    Patient has no allergy information on record.    Review of Systems   Review of Systems  Neurological:  Positive for  facial asymmetry, speech difficulty and weakness.  All other systems reviewed and are negative.   Physical Exam Updated Vital Signs BP (!) 156/101 (BP Location: Right Arm)   Pulse 83   Temp 98.7 F (37.1 C) (Oral)   Wt 59.2 kg   SpO2 100%   BMI 19.27 kg/m  Physical Exam Vitals and nursing note reviewed.  Constitutional:      General: He is not in acute distress.    Appearance: He is well-developed. He is not toxic-appearing or diaphoretic.  HENT:     Head: Normocephalic and atraumatic.     Right Ear: External ear normal.     Left Ear: External ear normal.     Nose: Nose normal.     Mouth/Throat:     Mouth: Mucous membranes are moist.     Pharynx: Oropharynx is clear.  Eyes:     Extraocular Movements: Extraocular movements intact.     Conjunctiva/sclera: Conjunctivae normal.  Cardiovascular:     Rate and Rhythm: Normal rate and regular rhythm.  Pulmonary:     Effort: Pulmonary effort is normal. No respiratory distress.  Abdominal:     General: There is no distension.     Palpations: Abdomen is soft.     Tenderness: There is no abdominal tenderness.  Musculoskeletal:        General: No swelling or deformity.     Cervical  back: Normal range of motion and neck supple.     Right lower leg: No edema.     Left lower leg: No edema.  Skin:    General: Skin is warm and dry.     Coloration: Skin is not jaundiced or pale.  Neurological:     Mental Status: He is alert.     Cranial Nerves: Dysarthria and facial asymmetry present.     Motor: Weakness present.  Psychiatric:        Mood and Affect: Mood normal.        Behavior: Behavior normal.     ED Results / Procedures / Treatments   Labs (all labs ordered are listed, but only abnormal results are displayed) Labs Reviewed  CBC - Abnormal; Notable for the following components:      Result Value   RBC 3.71 (*)    HCT 38.8 (*)    MCV 104.6 (*)    MCH 36.7 (*)    All other components within normal limits  I-STAT CHEM  8, ED - Abnormal; Notable for the following components:   Sodium 131 (*)    Chloride 97 (*)    BUN 3 (*)    Calcium, Ion 1.08 (*)    All other components within normal limits  PROTIME-INR  APTT  DIFFERENTIAL  COMPREHENSIVE METABOLIC PANEL  ETHANOL  HIV ANTIBODY (ROUTINE TESTING W REFLEX)  CBG MONITORING, ED    EKG None  Radiology CT HEAD CODE STROKE WO CONTRAST  Result Date: 11/18/2021 CLINICAL DATA:  Code stroke.  61 year male with weakness. EXAM: CT HEAD WITHOUT CONTRAST TECHNIQUE: Contiguous axial images were obtained from the base of the skull through the vertex without intravenous contrast. RADIATION DOSE REDUCTION: This exam was performed according to the departmental dose-optimization program which includes automated exposure control, adjustment of the mA and/or kV according to patient size and/or use of iterative reconstruction technique. COMPARISON:  Head CT 08/24/2010. FINDINGS: Brain: Globular hyperdense hemorrhage at the left basal ganglia, centered at the lentiform encompasses 19 x 22 x 21 mm (AP by transverse by CC), estimated volume 4 mL. Mild surrounding edema. No significant mass effect. No intraventricular or extra-axial extension of blood. Generalized cerebral volume loss since 2012 seems advanced for age. No disproportionate ventriculomegaly. No midline shift. Patchy additional bilateral cerebral white matter hypodensity. No superimposed cortically based acute infarct identified. Vascular: Calcified atherosclerosis at the skull base. No suspicious intracranial vascular hyperdensity. Skull: No acute osseous abnormality identified. Sinuses/Orbits: Visualized paranasal sinuses and mastoids are clear. Other: Visualized orbits and scalp soft tissues are within normal limits. ASPECTS [facility] Stroke Program Early CT Score) Total score (0-10 with 10 being normal): Not applicable, acute hemorrhage Study discussed by telephone with Dr. Derry Lory on 11/18/2021 at 11:46 . IMPRESSION:  1. 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 Mild surrounding edema. No significant mass effect. This was briefly discussed by telephone with Dr. Derry Lory on 11/18/2021 at 11:46. 2. Evidence of underlying chronic small vessel disease. Generalized brain volume loss since 2012 seems advanced for age. 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 Electronically Signed   By: Odessa Fleming M.D.   On: 11/18/2021 11:49    Procedures Procedures    Medications Ordered in ED Medications  sodium chloride flush (NS) 0.9 % injection 3 mL (has no administration in time range)   stroke: early stages of recovery book (has no administration in time range)  acetaminophen (TYLENOL) tablet 650 mg (has no administration in time range)  Or  acetaminophen (TYLENOL) 160 MG/5ML solution 650 mg (has no administration in time range)    Or  acetaminophen (TYLENOL) suppository 650 mg (has no administration in time range)  senna-docusate (Senokot-S) tablet 1 tablet (has no administration in time range)  pantoprazole (PROTONIX) injection 40 mg (has no administration in time range)  labetalol (NORMODYNE) injection 10 mg (10 mg Intravenous Given 11/18/21 1136)  clevidipine (CLEVIPREX) infusion 0.5 mg/mL (2 mg/hr Intravenous New Bag/Given 11/18/21 1150)  iohexol (OMNIPAQUE) 350 MG/ML injection 75 mL (75 mLs Intravenous Contrast Given 11/18/21 1150)    ED Course/ Medical Decision Making/ A&P                           Medical Decision Making Amount and/or Complexity of Data Reviewed Labs: ordered. Radiology: ordered.  Risk Decision regarding hospitalization.   This patient presents to the ED for concern of strokelike symptoms, this involves an extensive number of treatment options, and is a complaint that carries with it a high risk of complications and morbidity.  The differential diagnosis includes CVA, ICH, intoxication, hypoglycemia, hyponatremia, seizure   Co morbidities that complicate the patient  evaluation  HTN, COPD, alcohol abuse   Additional history obtained:  Additional history obtained from N/A External records from outside source obtained and reviewed including EMR   Lab Tests:  I Ordered, and personally interpreted labs.  The pertinent results include: Normal hemoglobin with macrocytosis consistent with chronic alcohol abuse, no leukocytosis is present.  Kidney function and electrolytes are grossly normal.  Remaining lab work pending at time of admission.   Imaging Studies ordered:  I ordered imaging studies including CT head, CTA I independently visualized and interpreted imaging which showed CT scan shows small left basal ganglia ICH without mass effect.  Remaining imaging pending at time of admission. I agree with the radiologist interpretation   Cardiac Monitoring: / EKG:  The patient was maintained on a cardiac monitor.  I personally viewed and interpreted the cardiac monitored which showed an underlying rhythm of: Sinus rhythm   Consultations Obtained:  I requested consultation with the neurologist, Dr. Derry Lory,  and discussed lab and imaging findings as well as pertinent plan - they recommend: Admission   Problem List / ED Course / Critical interventions / Medication management  Patient is a 61 year old male presenting for strokelike symptoms.  Onset was this morning, shortly prior to arrival.  He states that he woke up in his normal state of health.  On my assessment, patient's speech is difficult to understand.  He states that his speech does not seem normal to him.  Patient also seems to be mildly intoxicated.  He does state that he continues to drink but it is difficult to understand when he talks about daily consumption or last drink.  Per EMS, he did drink several beers today.  He has right hemibody weakness and facial droop.  Patient arrives as a code stroke was taken directly to CT scanner.  He remained alert and oriented.  Vital signs were notable  for moderate hypertension.  Imaging was reviewed by neurologist on-call, Dr. Derry Lory, who will admit the patient.  Labetalol and Cleviprex were ordered for blood pressure control.   Social Determinants of Health:  Ongoing alcohol abuse  CRITICAL CARE Performed by: Gloris Manchester   Total critical care time: 32 minutes  Critical care time was exclusive of separately billable procedures and treating other patients.  Critical care was necessary to treat  or prevent imminent or life-threatening deterioration.  Critical care was time spent personally by me on the following activities: development of treatment plan with patient and/or surrogate as well as nursing, discussions with consultants, evaluation of patient's response to treatment, examination of patient, obtaining history from patient or surrogate, ordering and performing treatments and interventions, ordering and review of laboratory studies, ordering and review of radiographic studies, pulse oximetry and re-evaluation of patient's condition.          Final Clinical Impression(s) / ED Diagnoses Final diagnoses:  Left-sided nontraumatic intracerebral hemorrhage, unspecified cerebral location [facility](HCC)    Rx / DC Orders ED Discharge Orders     None         Gloris Manchesterixon, Decklyn Hyder, MD 11/18/21 1648

## 2021-11-18 NOTE — Discharge Instructions (Signed)
                  Intensive Outpatient Programs  [facility]    [facility] [address]     [address] [place],  [place]     [place], [place] [phone]      [phone]  [facility]   [facility]  (Inpatient and outpatient)  [phone] (Suboxone and Methadone) [address]           [phone]           [facility]    [facility] - Intensive Outpatient [address]     [address] [place], [place] [address]     [place], [place]  [phone]      [phone]  [facility] (Outpatient, Inpatient, Chemical  [facility] (Groups and Residental) (insurance only) [phone]    [place], [place]          [phone]       [facility]    [facility] (for caregivers and family) [address]     [address] [place], [place]     [place], [place] [phone]      [phone]  Residential Treatment Programs  [facility]  [facility](2 years) Residential: 90 days)  [facility] [address]      [address] [place], [place]     [place], [place] [phone]      [phone] or [phone]  [facility]    [facility] [address]      [address] [place], [place]     , [place] [phone]      [phone]  [facility]   [facility] ([facility] [address]     [address] [place], [place] [address]     [place], [place] [phone]      [phone] Admissions: 8am-3pm M-F  [facility]: Residential Program (90 Days)              [facility]: [facility]  [place], [place]     [place],   [phone] or [phone]    (Walk in Hours over the weekend or by referral)   [facility]: [facility]:[phone] (for crisis  response 24 hours a day) 

## 2021-11-19 ENCOUNTER — Inpatient Hospital Stay (HOSPITAL_COMMUNITY): Payer: Medicaid Other

## 2021-11-19 DIAGNOSIS — I610 Nontraumatic intracerebral hemorrhage in hemisphere, subcortical: Secondary | ICD-10-CM | POA: Diagnosis not present

## 2021-11-19 DIAGNOSIS — I6389 Other cerebral infarction: Secondary | ICD-10-CM

## 2021-11-19 LAB — LIPID PANEL
Cholesterol: 160 mg/dL (ref 0–200)
HDL: 88 mg/dL (ref >40)
LDL Cholesterol: 62 mg/dL (ref 0–99)
Total CHOL/HDL Ratio: 1.8 RATIO
Triglycerides: 48 mg/dL (ref <150)
VLDL: 10 mg/dL (ref 0–40)

## 2021-11-19 LAB — COMPREHENSIVE METABOLIC PANEL
ALT: 34 U/L (ref 0–44)
AST: 59 U/L — ABNORMAL HIGH (ref 15–41)
Albumin: 3.8 g/dL (ref 3.5–5.0)
Alkaline Phosphatase: 53 U/L (ref 38–126)
Anion gap: 14 (ref 5–15)
BUN: 7 mg/dL — ABNORMAL LOW (ref 8–23)
CO2: 21 mmol/L — ABNORMAL LOW (ref 22–32)
Calcium: 9.3 mg/dL (ref 8.9–10.3)
Chloride: 99 mmol/L (ref 98–111)
Creatinine, Ser: 0.65 mg/dL (ref 0.61–1.24)
GFR, Estimated: >60 mL/min (ref >60)
Glucose, Bld: 99 mg/dL (ref 70–99)
Potassium: 4.3 mmol/L (ref 3.5–5.1)
Sodium: 134 mmol/L — ABNORMAL LOW (ref 135–145)
Total Bilirubin: 0.7 mg/dL (ref 0.3–1.2)
Total Protein: 7.7 g/dL (ref 6.5–8.1)

## 2021-11-19 LAB — PROTIME-INR
INR: 1 (ref 0.8–1.2)
Prothrombin Time: 13.1 seconds (ref 11.4–15.2)

## 2021-11-19 LAB — CBC
HCT: 38.1 % — ABNORMAL LOW (ref 39.0–52.0)
Hemoglobin: 13.5 g/dL (ref 13.0–17.0)
MCH: 36.2 pg — ABNORMAL HIGH (ref 26.0–34.0)
MCHC: 35.4 g/dL (ref 30.0–36.0)
MCV: 102.1 fL — ABNORMAL HIGH (ref 80.0–100.0)
Platelets: 214 10*3/uL (ref 150–400)
RBC: 3.73 MIL/uL — ABNORMAL LOW (ref 4.22–5.81)
RDW: 13.2 % (ref 11.5–15.5)
WBC: 5.7 10*3/uL (ref 4.0–10.5)
nRBC: 0 % (ref 0.0–0.2)

## 2021-11-19 LAB — HEMOGLOBIN A1C
Hgb A1c MFr Bld: 5.1 % (ref 4.8–5.6)
Mean Plasma Glucose: 99.67 mg/dL

## 2021-11-19 LAB — ECHOCARDIOGRAM COMPLETE
AV Mean grad: 2 mmHg
AV Peak grad: 4.4 mmHg
Ao pk vel: 1.05 m/s
Area-P 1/2: 2.99 cm2
Weight: 2088.2 oz

## 2021-11-19 LAB — MAGNESIUM: Magnesium: 1.7 mg/dL (ref 1.7–2.4)

## 2021-11-19 MED ORDER — HEPARIN SODIUM (PORCINE) 5000 UNIT/ML IJ SOLN
5000.0000 [IU] | Freq: Three times a day (TID) | INTRAMUSCULAR | Status: DC
  Administered 2021-11-19 – 2021-12-28 (×116): 5000 [IU] via SUBCUTANEOUS
  Filled 2021-11-19 (×115): qty 1

## 2021-11-19 MED ORDER — MAGNESIUM OXIDE -MG SUPPLEMENT 400 (240 MG) MG PO TABS
400.0000 mg | ORAL_TABLET | Freq: Two times a day (BID) | ORAL | Status: AC
  Administered 2021-11-19 (×2): 400 mg via ORAL
  Filled 2021-11-19 (×2): qty 1

## 2021-11-19 MED ORDER — PANTOPRAZOLE SODIUM 40 MG PO TBEC: 40.0000 mg | DELAYED_RELEASE_TABLET | Freq: Every day | ORAL | Status: DC

## 2021-11-19 MED ORDER — PERFLUTREN LIPID MICROSPHERE
1.0000 mL | INTRAVENOUS | Status: AC | PRN
  Administered 2021-11-19: 2 mL via INTRAVENOUS

## 2021-11-19 MED ORDER — LABETALOL HCL 5 MG/ML IV SOLN
5.0000 mg | INTRAVENOUS | Status: DC | PRN
  Administered 2021-11-19: 5 mg via INTRAVENOUS
  Filled 2021-11-19: qty 4

## 2021-11-19 NOTE — Progress Notes (Signed)
STROKE TEAM PROGRESS NOTE   SUBJECTIVE (INTERVAL HISTORY) No family is at the bedside.  Overall his condition is stable. Pt lying in bed, AAO x 3, still has dysarthria and right hemiparesis. CT repeat showed stable hematoma. BP stable. Pt educated on smoking cessation and limiting alcohol use.   OBJECTIVE Temp:  [97.7 F (36.5 C)-98.4 F (36.9 C)] 97.7 F (36.5 C) (11/12 0800) Pulse Rate:  [65-95] 89 (11/12 0800) Resp:  [14-34] 16 (11/12 0800) BP: (109-157)/(74-121) 116/100 (11/12 0800) SpO2:  [94 %-100 %] 97 % (11/12 0800)  Recent Labs  Lab 11/18/21 1129  GLUCAP 95   Recent Labs  Lab 11/18/21 1130 11/18/21 1134 11/19/21 0231  NA 132* 131* 134*  K 4.2 4.3 4.3  CL 96* 97* 99  CO2 21*  --  21*  GLUCOSE 93 88 99  BUN <5* 3* 7*  CREATININE 0.59* 0.80 0.65  CALCIUM 9.1  --  9.3  MG  --   --  1.7   Recent Labs  Lab 11/18/21 1130 11/19/21 0231  AST 76* 59*  ALT 38 34  ALKPHOS 58 53  BILITOT 0.5 0.7  PROT 8.0 7.7  ALBUMIN 4.0 3.8   Recent Labs  Lab 11/18/21 1130 11/18/21 1134 11/19/21 0231  WBC 7.5  --  5.7  NEUTROABS 4.3  --   --   HGB 13.6 15.0 13.5  HCT 38.8* 44.0 38.1*  MCV 104.6*  --  102.1*  PLT 224  --  214   No results for input(s): "CKTOTAL", "CKMB", "CKMBINDEX", "TROPONINI" in the last 168 hours. Recent Labs    11/18/21 1130 11/19/21 0231  LABPROT 12.7 13.1  INR 1.0 1.0   No results for input(s): "COLORURINE", "LABSPEC", "PHURINE", "GLUCOSEU", "HGBUR", "BILIRUBINUR", "KETONESUR", "PROTEINUR", "UROBILINOGEN", "NITRITE", "LEUKOCYTESUR" in the last 72 hours.  Invalid input(s): "APPERANCEUR"     Component Value Date/Time   CHOL 160 11/19/2021 0231   CHOL 153 10/10/2020 1650   TRIG 48 11/19/2021 0231   HDL 88 11/19/2021 0231   HDL 75 10/10/2020 1650   CHOLHDL 1.8 11/19/2021 0231   VLDL 10 11/19/2021 0231   LDLCALC 62 11/19/2021 0231   LDLCALC 62 10/10/2020 1650   Lab Results  Component Value Date   HGBA1C 5.1 11/19/2021      Component  Value Date/Time   LABOPIA NONE DETECTED 08/04/2013 2132   COCAINSCRNUR NONE DETECTED 08/04/2013 2132   LABBENZ NONE DETECTED 08/04/2013 2132   AMPHETMU NONE DETECTED 08/04/2013 2132   THCU NONE DETECTED 08/04/2013 2132   LABBARB NONE DETECTED 08/04/2013 2132    Recent Labs  Lab 11/18/21 1130  ETH 201*    I have personally reviewed the radiological images below and agree with the radiology interpretations.  CT HEAD WO CONTRAST  Result Date: 11/18/2021 CLINICAL DATA:  Hemorrhagic stroke. EXAM: CT HEAD WITHOUT CONTRAST TECHNIQUE: Contiguous axial images were obtained from the base of the skull through the vertex without intravenous contrast. RADIATION DOSE REDUCTION: This exam was performed according to the departmental dose-optimization program which includes automated exposure control, adjustment of the mA and/or kV according to patient size and/or use of iterative reconstruction technique. COMPARISON:  CT angiography dated 11/18/2021. FINDINGS: Brain: No significant interval change in the left basal ganglia hemorrhage. No new hemorrhage. There is mild age-related atrophy and chronic microvascular ischemic changes. No mass effect or midline shift. No extra-axial fluid collection. Vascular: No hyperdense vessel or unexpected calcification. Skull: Normal. Negative for fracture or focal lesion. Sinuses/Orbits: The visualized paranasal sinuses  and mastoid air cells are clear. There is deviation of the nose to the left. Cerumen noted in the external auditory canals bilaterally. Other: None IMPRESSION: 1. No significant interval change in the left basal ganglia hemorrhage. No new hemorrhage. 2. Mild age-related atrophy and chronic microvascular ischemic changes. Electronically Signed   By: Elgie Collard M.D.   On: 11/18/2021 19:34   MR BRAIN W WO CONTRAST  Result Date: 11/18/2021 CLINICAL DATA:  Neuro deficit, acute, stroke suspected EXAM: MRI HEAD WITHOUT AND WITH CONTRAST TECHNIQUE:  Multiplanar, multiecho pulse sequences of the brain and surrounding structures were obtained without and with intravenous contrast. CONTRAST:  58mL GADAVIST GADOBUTROL 1 MMOL/ML IV SOLN COMPARISON:  CT head from the same day. FINDINGS: Brain: When comparing across modalities, similar size of acute hemorrhage in the left basal ganglia. Mild surrounding edema. No significant mass effect. No midline shift. No surrounding restricted diffusion to suggest acute fracture. No visible mass lesion; however, acute blood products limit assessment. Patchy T2/FLAIR hyperintensity in the white matter, nonspecific but compatible with chronic microvascular disease. Cerebral atrophy. No hydrocephalus. No pathologic enhancement. Vascular: Major arterial flow voids are maintained skull base. Skull and upper cervical spine: Normal marrow signal. Sinuses/Orbits: Clear sinuses.  No acute orbital findings. Other: No mastoid effusions. IMPRESSION: When comparing across modalities, similar size of acute hemorrhage in the left basal ganglia. No visible acute infarct or mass lesion; however, acute blood products limits assessment. Electronically Signed   By: Feliberto Harts M.D.   On: 11/18/2021 14:50   CT ANGIO HEAD NECK W WO CM (CODE STROKE)  Result Date: 11/18/2021 CLINICAL DATA:  Code stroke. 61 year male with weakness. Acute left basal ganglia hemorrhage on plain CT. EXAM: CT ANGIOGRAPHY HEAD AND NECK TECHNIQUE: Multidetector CT imaging of the head and neck was performed using the standard protocol during bolus administration of intravenous contrast. Multiplanar CT image reconstructions and MIPs were obtained to evaluate the vascular anatomy. Carotid stenosis measurements (when applicable) are obtained utilizing NASCET criteria, using the distal internal carotid diameter as the denominator. RADIATION DOSE REDUCTION: This exam was performed according to the departmental dose-optimization program which includes automated exposure  control, adjustment of the mA and/or kV according to patient size and/or use of iterative reconstruction technique. CONTRAST:  52mL OMNIPAQUE IOHEXOL 350 MG/ML SOLN COMPARISON:  Plain head CT 1132 hours today. FINDINGS: CTA NECK Skeleton: Cervical spine degeneration with developing degenerative appearing upper cervical multilevel posterior element ankylosis. No acute or suspicious osseous lesion identified. Upper chest: Centrilobular and paraseptal emphysema. Negative visible superior mediastinum. Other neck: Chronic nasal bone fractures. No acute finding identified. Aortic arch: 3 vessel arch configuration. Mild Calcified aortic atherosclerosis. Right carotid system: Brachiocephalic artery and proximal right CCA are negative. Minimal plaque in the distal right CCA and at the bifurcation primarily affecting the ECA. No stenosis. Left carotid system: Intermittent mild left CCA plaque before the bifurcation without stenosis. Mild calcified plaque at the bifurcation more affecting the ECA. No stenosis. Vertebral arteries: Proximal right subclavian artery and right vertebral artery origin plaque with only mild vertebral origin stenosis (series 8, image 103). Right vertebral appears somewhat non dominant but patent to the skull base without additional stenosis. Proximal left subclavian artery plaque with no significant stenosis. Mild plaque at the left vertebral artery origin with mild if any stenosis. Mildly dominant left vertebral artery is patent to the skull base with no additional stenosis. CTA HEAD Posterior circulation: Dominant left V4 segment especially distal to the patent right PICA  origin. Normal left PICA. Mild left V4 calcified plaque. No significant distal vertebral or vertebrobasilar junction stenosis. Patent basilar artery without stenosis. Patent SCA and PCA origins. Right PCA branches are within normal limits. Left PCA mild P2 segment irregularity with no significant stenosis. Anterior circulation:  Both ICA siphons are patent. Mild, mostly supraclinoid calcified plaque bilaterally. Mild supraclinoid ICA stenosis more pronounced on the right. Patent carotid termini. Patent MCA and ACA origins. Anterior communicating artery and ACA branches are within normal limits. The left A2 appears to be dominant. Right MCA M1 segment and bifurcation are patent without stenosis. Right MCA branches are within normal limits. No left hemisphere CTA spot sign. Left MCA M1 segment is tortuous without stenosis. Patent left MCA bifurcation without stenosis. Left MCA branches are patent with some mild M2 and M3 irregularity in the middle sylvian division. Venous sinuses: Early contrast timing, not well evaluated. Anatomic variants: Dominant left vertebral artery. Dominant left ACA A2. Review of the MIP images confirms the above findings IMPRESSION: 1. No CTA spot sign in association with left basal ganglia hemorrhage. Negative for large vessel occlusion. 2. Generally mild atherosclerosis in the head and neck. No hemodynamically significant stenosis. 3. Cervical spine degeneration. Aortic Atherosclerosis (ICD10-I70.0) and Emphysema (ICD10-J43.9). Salient findings were communicated to Dr. Derry LoryKhaliqdina at 12:05 pm on 11/18/2021 by text page via the [facility] messaging system. Electronically Signed   By: Odessa FlemingH  Hall M.D.   On: 11/18/2021 12:05   CT HEAD CODE STROKE WO CONTRAST  Result Date: 11/18/2021 CLINICAL DATA:  Code stroke.  [age] year male with weakness. EXAM: CT HEAD WITHOUT CONTRAST TECHNIQUE: Contiguous axial images were obtained from the base of the skull through the vertex without intravenous contrast. RADIATION DOSE REDUCTION: This exam was performed according to the departmental dose-optimization program which includes automated exposure control, adjustment of the mA and/or kV according to patient size and/or use of iterative reconstruction technique. COMPARISON:  Head CT 08/24/2010. FINDINGS: Brain: Globular hyperdense hemorrhage  at the left basal ganglia, centered at the lentiform encompasses 19 x 22 x 21 mm (AP by transverse by CC), estimated volume 4 mL. Mild surrounding edema. No significant mass effect. No intraventricular or extra-axial extension of blood. Generalized cerebral volume loss since 2012 seems advanced for age. No disproportionate ventriculomegaly. No midline shift. Patchy additional bilateral cerebral white matter hypodensity. No superimposed cortically based acute infarct identified. Vascular: Calcified atherosclerosis at the skull base. No suspicious intracranial vascular hyperdensity. Skull: No acute osseous abnormality identified. Sinuses/Orbits: Visualized paranasal sinuses and mastoids are clear. Other: Visualized orbits and scalp soft tissues are within normal limits. ASPECTS [facility]([place] Stroke Program Early CT Score) Total score (0-10 with 10 being normal): Not applicable, acute hemorrhage Study discussed by telephone with Dr. Derry LoryKhaliqdina on 11/18/2021 at 11:46 . IMPRESSION: 1. 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 Mild surrounding edema. No significant mass effect. This was briefly discussed by telephone with Dr. Derry LoryKhaliqdina on 11/18/2021 at 11:46. 2. Evidence of underlying chronic small vessel disease. Generalized brain volume loss since 2012 seems advanced for age. 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 Electronically Signed   By: Odessa FlemingH  Hall M.D.   On: 11/18/2021 11:49     PHYSICAL EXAM  Temp:  [97.7 F (36.5 C)-98.4 F (36.9 C)] 97.7 F (36.5 C) (11/12 0800) Pulse Rate:  [65-95] 89 (11/12 0800) Resp:  [14-34] 16 (11/12 0800) BP: (109-157)/(74-121) 116/100 (11/12 0800) SpO2:  [94 %-100 %] 97 % (11/12 0800)  General - thin built, well developed, in no apparent  distress.  Ophthalmologic - fundi not visualized due to noncooperation.  Cardiovascular - Regular rhythm and rate.  Mental Status -  Level of arousal and orientation to time, place, and person were intact. Language including  expression, naming, repetition, comprehension was assessed and found intact. Moderate dysarthria  Cranial Nerves II - XII - II - Visual field intact OU. III, IV, VI - Extraocular movements intact. V - Facial sensation decreased on the right. VII - Facial movement showed right facial droop. VIII - Hearing & vestibular intact bilaterally. X - Palate elevates symmetrically. XI - Chin turning & shoulder shrug intact bilaterally. XII - Tongue protrusion intact.  Motor Strength - The patient's strength was normal in LUE and LLE and RUE deltoid 0/5, bicep 2/5, tricep 1/5. RLE proximal and distal 3/5.  Bulk was normal and fasciculations were absent.   Motor Tone - Muscle tone was assessed at the neck and appendages and was normal.  Reflexes - The patient's reflexes were symmetrical in all extremities and he had no pathological reflexes.  Sensory - Light touch, temperature/pinprick were assessed and were decreased on the right  Coordination - The patient had normal movements in the left hand with no ataxia or dysmetria.  Tremor was absent.  Gait and Station - deferred.   ASSESSMENT/PLAN [name] is a 61 y.o. male with history of hypertension, smoker, heavy alcoholic admitted for right-sided numbness and weakness, right facial droop, slurred speech. No tPA given due to ICH.    ICH:  left BG ICH, likely due to small vessel disease given multiple uncontrolled risk factors CT showed left BG small ICH CTA head and neck unremarkable, no aneurysm or AVM MRI left BG small ICH CT repeat stable hematoma 2D Echo EF 60 to 65% LDL 62 HgbA1c 5.1 UDS pending Heparin subcu for VTE prophylaxis No antithrombotic prior to admission, now on No antithrombotic given ICH Ongoing aggressive stroke risk factor management Therapy recommendations: CIR Disposition: Pending  Hypertension Stable No IV BP meds needs Long term BP goal normotensive  Tobacco abuse Current smoker Smoking cessation  counseling provided Pt is willing to quit  Alcohol abuse Heavy alcohol user, 6-10 beers every day On CIWA protocol Thiamine/folic acid/multivitamin Limitation of alcohol use education provided  Other Stroke Risk Factors   Other Active Problems   Hospital day # 1  This patient is critically ill due to ICH, heavy alcohol abuse and at significant risk of neurological worsening, death form hematoma expansion, cerebral edema, brain herniation, delirium and tremor. This patient's care requires constant monitoring of vital signs, hemodynamics, respiratory and cardiac monitoring, review of multiple databases, neurological assessment, discussion with family, other specialists and medical decision making of high complexity. I spent 35 minutes of neurocritical care time in the care of this patient.    Marvel Plan, MD PhD Stroke Neurology 11/19/2021 11:57 AM    To contact Stroke Continuity provider, please refer to [email or web address]. After hours, contact General Neurology

## 2021-11-19 NOTE — Progress Notes (Signed)
PT Cancellation Note  Patient Details Name: Brent Warren MRN: [id number] DOB: 09-03-1960   Cancelled Treatment:    Reason Eval/Treat Not Completed: Active bedrest order remains this morning until 1137. PT will continue to follow and evaluate as appropriate.   Vickki Muff, PT, DPT   Acute Rehabilitation Department   [name] 11/19/2021, 7:51 AM

## 2021-11-19 NOTE — Evaluation (Signed)
Physical Therapy Evaluation Patient Details Name: Brent Warren MRN: [id number] DOB: 06/16/1960 Today's Date: 11/19/2021  History of Present Illness  The pt is a 61 yo male presenting 11/11 with R-sided weakness, facial droop and slurred speech. 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 PMH includes: HTN, COPD, and alcohol abuse.   Clinical Impression  Pt in bed upon arrival of PT, agreeable to evaluation at this time. Prior to admission the pt was independent with all mobility, reports no use of DME or recent falls. The pt now presents with limitations in functional mobility, strength, stability, coordination, awareness, and endurance due to above dx, and will continue to benefit from skilled PT to address these deficits. The pt required minA to complete bed mobility and modA to complete sit-stand transfers and short bout of ambulation in the room. LOB in all direction with seated balance and gait, needing modA through HHA to maintain upright. Given prior level of independence and family support as well as significant level of assist needed at this time, recommend acute inpatient rehab when medically stable for d/c.         Recommendations for follow up therapy are one component of a multi-disciplinary discharge planning process, led by the attending physician.  Recommendations may be updated based on patient status, additional functional criteria and insurance authorization.  Follow Up Recommendations Acute inpatient rehab (3hours/day)      Assistance Recommended at Discharge Frequent or constant Supervision/Assistance  Patient can return home with the following  A lot of help with walking and/or transfers;A lot of help with bathing/dressing/bathroom;Assistance with cooking/housework;Direct supervision/assist for medications management;Direct supervision/assist for financial management;Assist for transportation;Help with stairs or ramp for entrance    Equipment Recommendations  (defer to  post acute)  Recommendations for Other Services  Rehab consult    Functional Status Assessment Patient has had a recent decline in their functional status and demonstrates the ability to make significant improvements in function in a reasonable and predictable amount of time.     Precautions / Restrictions Precautions Precautions: Fall Precaution Comments: R hemi, SBP < 160 Restrictions Weight Bearing Restrictions: No      Mobility  Bed Mobility Overal bed mobility: Needs Assistance Bed Mobility: Supine to Sit, Sit to Supine     Supine to sit: Min assist Sit to supine: Min assist   General bed mobility comments: minA to elevate trunk with assist to bring RUE forwards and attend. initially minA to maintain seated balance, but progressed to minG. minA to bring LE back to bed    Transfers Overall transfer level: Needs assistance Equipment used: 1 person hand held assist Transfers: Sit to/from Stand Sit to Stand: Mod assist, Min assist           General transfer comment: modA to rise and steady, strong posterior lean    Ambulation/Gait Ambulation/Gait assistance: Mod assist Gait Distance (Feet): 15 Feet Assistive device: 1 person hand held assist Gait Pattern/deviations: Step-to pattern, Decreased stride length, Ataxic Gait velocity: decreased Gait velocity interpretation: <1.31 ft/sec, indicative of household ambulator   General Gait Details: pt with small steps and incoordinated placement of feet (RLE worse than LLE) LOB in all direction but able to correct by pulling on therapist hand through HHA   Modified Rankin (Stroke Patients Only) Modified Rankin (Stroke Patients Only) Pre-Morbid Rankin Score: No symptoms Modified Rankin: Moderately severe disability     Balance Overall balance assessment: Needs assistance Sitting-balance support: Single extremity supported, Feet supported Sitting balance-Leahy Scale: Poor  Sitting balance - Comments: falling  posteriorly, minA to minG Postural control: Posterior lean Standing balance support: Single extremity supported, During functional activity Standing balance-Leahy Scale: Poor Standing balance comment: LOB in all planes, modA through HHA to maintain upright                             Pertinent Vitals/Pain Pain Assessment Pain Assessment: Faces Pain Score: 0-No pain Faces Pain Scale: No hurt Pain Intervention(s): Monitored during session    Home Living Family/patient expects to be discharged to:: Private residence Living Arrangements: Other relatives (brother and brother's family) Available Help at Discharge: Family;Available PRN/intermittently Type of Home: House Home Access: Level entry       Home Layout: One level Home Equipment: None      Prior Function Prior Level of Function : Independent/Modified Independent             Mobility Comments: pt reports independence, difficult to fully understand but denies use of DME ADLs Comments: reports he is independent but not driving, uses a bike if needed     Hand Dominance   Dominant Hand: Right    Extremity/Trunk Assessment   Upper Extremity Assessment Upper Extremity Assessment: RUE deficits/detail RUE Deficits / Details: grossly 2/5 to MMT, weakest finger extension and shoulder flexion and abduction, good strength in gravity minimized position for elbow extension and flexion RUE Sensation: decreased light touch RUE Coordination: decreased fine motor;decreased gross motor    Lower Extremity Assessment Lower Extremity Assessment: RLE deficits/detail RLE Deficits / Details: grossly 3+/5 to MMT, reports diminished sensation compared to LLE. RLE Sensation: decreased light touch RLE Coordination: decreased fine motor;decreased gross motor    Cervical / Trunk Assessment Cervical / Trunk Assessment: Normal  Communication   Communication: Expressive difficulties (difficult to understand, dysarthria vs  accent)  Cognition Arousal/Alertness: Awake/alert Behavior During Therapy: WFL for tasks assessed/performed, Impulsive Overall Cognitive Status: No family/caregiver present to determine baseline cognitive functioning                                 General Comments: no family present and pt difficult to understand at times, is able to answer simple questions and is following all commands well in session. impaired insight into deficits, no family present to confirm baseline        General Comments General comments (skin integrity, edema, etc.): BP elevated, RN present to deliver BP meds    Exercises     Assessment/Plan    PT Assessment Patient needs continued PT services  PT Problem List Decreased strength;Decreased range of motion;Decreased activity tolerance;Decreased balance;Decreased mobility;Decreased coordination;Decreased cognition;Decreased safety awareness       PT Treatment Interventions DME instruction;Gait training;Stair training;Functional mobility training;Therapeutic activities;Therapeutic exercise;Balance training;Neuromuscular re-education;Patient/family education    PT Goals (Current goals can be found in the Care Plan section)  Acute Rehab PT Goals Patient Stated Goal: return home PT Goal Formulation: With patient Time For Goal Achievement: 12/03/21 Potential to Achieve Goals: Good    Frequency Min 4X/week        AM-PAC PT "6 Clicks" Mobility  Outcome Measure Help needed turning from your back to your side while in a flat bed without using bedrails?: A Little Help needed moving from lying on your back to sitting on the side of a flat bed without using bedrails?: A Little Help needed moving to and from a bed to  a chair (including a wheelchair)?: A Lot Help needed standing up from a chair using your arms (e.g., wheelchair or bedside chair)?: A Lot Help needed to walk in hospital room?: A Lot Help needed climbing 3-5 steps with a railing? : A  Lot 6 Click Score: 14    End of Session Equipment Utilized During Treatment: Gait belt Activity Tolerance: Patient tolerated treatment well Patient left: in bed;with call bell/phone within reach;with bed alarm set Nurse Communication: Mobility status PT Visit Diagnosis: Other abnormalities of gait and mobility (R26.89);Muscle weakness (generalized) (M62.81);Hemiplegia and hemiparesis Hemiplegia - Right/Left: Right Hemiplegia - dominant/non-dominant: Dominant Hemiplegia - caused by: Nontraumatic intracerebral hemorrhage    Time: 1735-1806 PT Time Calculation (min) (ACUTE ONLY): 31 min   Charges:   PT Evaluation $PT Eval Moderate Complexity: 1 Mod PT Treatments $Therapeutic Activity: 8-22 mins        Vickki Muff, PT, DPT   Acute Rehabilitation Department  Brent Warren 11/19/2021, 6:25 PM

## 2021-11-19 NOTE — Progress Notes (Signed)
  Echocardiogram 2D Echocardiogram has been performed.  Brent Warren 11/19/2021, 10:03 AM

## 2021-11-19 NOTE — Progress Notes (Signed)
OT Cancellation Note  Patient Details Name: Brent Warren MRN: [id number] DOB: 1960-09-04   Cancelled Treatment:    Reason Eval/Treat Not Completed: Active bedrest order (OT eval to f/u as activity orders progress)  Donia Pounds 11/19/2021, 7:12 AM

## 2021-11-19 NOTE — Progress Notes (Signed)
Echo attempted. Patient on bedpan. Will attempt again as schedule permits.

## 2021-11-20 ENCOUNTER — Inpatient Hospital Stay (HOSPITAL_COMMUNITY): Payer: Medicaid Other

## 2021-11-20 DIAGNOSIS — I610 Nontraumatic intracerebral hemorrhage in hemisphere, subcortical: Secondary | ICD-10-CM | POA: Diagnosis not present

## 2021-11-20 DIAGNOSIS — R4182 Altered mental status, unspecified: Secondary | ICD-10-CM | POA: Diagnosis not present

## 2021-11-20 DIAGNOSIS — R718 Other abnormality of red blood cells: Secondary | ICD-10-CM | POA: Insufficient documentation

## 2021-11-20 DIAGNOSIS — J439 Emphysema, unspecified: Secondary | ICD-10-CM | POA: Diagnosis not present

## 2021-11-20 DIAGNOSIS — I10 Essential (primary) hypertension: Secondary | ICD-10-CM

## 2021-11-20 DIAGNOSIS — E871 Hypo-osmolality and hyponatremia: Secondary | ICD-10-CM | POA: Diagnosis not present

## 2021-11-20 DIAGNOSIS — F1010 Alcohol abuse, uncomplicated: Secondary | ICD-10-CM

## 2021-11-20 DIAGNOSIS — F10931 Alcohol use, unspecified with withdrawal delirium: Secondary | ICD-10-CM | POA: Insufficient documentation

## 2021-11-20 LAB — VITAMIN B12: Vitamin B-12: 177 pg/mL — ABNORMAL LOW (ref 180–914)

## 2021-11-20 LAB — FOLATE: Folate: 12.7 ng/mL (ref >5.9)

## 2021-11-20 MED ORDER — AMLODIPINE BESYLATE 5 MG PO TABS: 5.0000 mg | ORAL_TABLET | Freq: Every day | ORAL | Status: DC

## 2021-11-20 MED ORDER — PHENOBARBITAL SODIUM 130 MG/ML IJ SOLN
97.5000 mg | Freq: Three times a day (TID) | INTRAMUSCULAR | Status: AC
  Administered 2021-11-20 – 2021-11-22 (×6): 97.5 mg via INTRAVENOUS
  Filled 2021-11-20 (×6): qty 1

## 2021-11-20 MED ORDER — LORAZEPAM 2 MG/ML IJ SOLN
1.0000 mg | INTRAMUSCULAR | Status: DC | PRN
  Administered 2021-11-21 – 2021-12-08 (×26): 1 mg via INTRAVENOUS
  Filled 2021-11-20 (×27): qty 1

## 2021-11-20 MED ORDER — PHENOBARBITAL SODIUM 65 MG/ML IJ SOLN
65.0000 mg | Freq: Three times a day (TID) | INTRAMUSCULAR | Status: AC
  Administered 2021-11-22 – 2021-11-24 (×6): 65 mg via INTRAVENOUS
  Filled 2021-11-20 (×6): qty 1

## 2021-11-20 MED ORDER — IPRATROPIUM-ALBUTEROL 0.5-2.5 (3) MG/3ML IN SOLN
3.0000 mL | Freq: Four times a day (QID) | RESPIRATORY_TRACT | Status: DC
  Administered 2021-11-20: 3 mL via RESPIRATORY_TRACT
  Filled 2021-11-20: qty 3

## 2021-11-20 MED ORDER — LABETALOL HCL 5 MG/ML IV SOLN
5.0000 mg | INTRAVENOUS | Status: DC | PRN
  Filled 2021-11-20: qty 4

## 2021-11-20 MED ORDER — IPRATROPIUM-ALBUTEROL 0.5-2.5 (3) MG/3ML IN SOLN
3.0000 mL | Freq: Two times a day (BID) | RESPIRATORY_TRACT | Status: DC
  Filled 2021-11-20: qty 3

## 2021-11-20 MED ORDER — CYANOCOBALAMIN 1000 MCG/ML IJ SOLN
1000.0000 ug | Freq: Every day | INTRAMUSCULAR | Status: AC
  Administered 2021-11-20 – 2021-11-24 (×5): 1000 ug via INTRAMUSCULAR
  Filled 2021-11-20 (×7): qty 1

## 2021-11-20 MED ORDER — PHENOBARBITAL SODIUM 65 MG/ML IJ SOLN
32.5000 mg | Freq: Three times a day (TID) | INTRAMUSCULAR | Status: AC
  Administered 2021-11-24 – 2021-11-26 (×6): 32.5 mg via INTRAVENOUS
  Filled 2021-11-20 (×6): qty 1

## 2021-11-20 MED ORDER — IPRATROPIUM-ALBUTEROL 0.5-2.5 (3) MG/3ML IN SOLN: 3.0000 mL | Freq: Four times a day (QID) | RESPIRATORY_TRACT | Status: DC | PRN

## 2021-11-20 MED ORDER — BUDESONIDE 0.5 MG/2ML IN SUSP
0.5000 mg | Freq: Two times a day (BID) | RESPIRATORY_TRACT | Status: DC
  Administered 2021-11-20: 0.5 mg via RESPIRATORY_TRACT
  Filled 2021-11-20 (×2): qty 2

## 2021-11-20 MED ORDER — SODIUM CHLORIDE 0.9 % IV SOLN
260.0000 mg | Freq: Once | INTRAVENOUS | Status: AC
  Administered 2021-11-20: 260 mg via INTRAVENOUS
  Filled 2021-11-20: qty 2

## 2021-11-20 NOTE — Evaluation (Signed)
Occupational Therapy Evaluation Patient Details Name: Brent Warren MRN: [id number] DOB: [birth year] Today's Date: 11/20/2021   History of Present Illness 61 yo male presenting 11/11 with R-sided weakness, facial droop and slurred speech. 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 PMH includes: HTN, COPD, and alcohol abuse.   Clinical Impression   PT admitted with ICH L basal ganglia. Pt currently with functional limitiations due to the deficits listed below (see OT problem list). Pt currently with expressive deficits but following 1 step commands. Pt followign 2 step command trying to count and clap at the same time. Pt with increased physical (A) noted this session for basic transfers compared to the initial PT session.  Pt will benefit from skilled OT to increase their independence and safety with adls and balance to allow discharge CIR.       Recommendations for follow up therapy are one component of a multi-disciplinary discharge planning process, led by the attending physician.  Recommendations may be updated based on patient status, additional functional criteria and insurance authorization.   Follow Up Recommendations  Acute inpatient rehab (3hours/day)     Assistance Recommended at Discharge Intermittent Supervision/Assistance  Patient can return home with the following Two people to help with walking and/or transfers;Two people to help with bathing/dressing/bathroom    Functional Status Assessment  Patient has had a recent decline in their functional status and demonstrates the ability to make significant improvements in function in a reasonable and predictable amount of time.  Equipment Recommendations  BSC/3in1;Wheelchair (measurements OT);Wheelchair cushion (measurements OT)    Recommendations for Other Services Rehab consult     Precautions / Restrictions Precautions Precautions: Fall Precaution Comments: R hemi, SBP < 160 Restrictions Weight Bearing Restrictions: No       Mobility Bed Mobility Overal bed mobility: Needs Assistance Bed Mobility: Supine to Sit, Sit to Supine     Supine to sit: +2 for physical assistance, Mod assist Sit to supine: +2 for physical assistance, Mod assist   General bed mobility comments: pt needs (A) to sequence enter and exit of bed surface. Pt once eob with hip extension and needs (A) to sustain a anterior pelvic tilt for safe sitting. bil le blocked at EOB to keep pt stay as well. Pt needs (A) to lay on R side and bring bil LE onto the bed surface. total +2 total to scoot toward the [facility]. pt does lift his head on command to help    Transfers Overall transfer level: Needs assistance Equipment used: 2 person hand held assist Transfers: Sit to/from Stand Sit to Stand: +2 physical assistance, Max assist           General transfer comment: pt on first attempt with extension and flexed posture. pt requires bil LE blocked. pt unsteady standing. pt returned to sitting and second attempt. pt with increased up right trunk extension. pt total (A) to scoot R LE toward HOB and stepping with total+2 max weight shfit to move the LLE.      Balance Overall balance assessment: Needs assistance Sitting-balance support: Single extremity supported, Feet supported Sitting balance-Leahy Scale: Poor     Standing balance support: Single extremity supported, During functional activity, Reliant on assistive device for balance Standing balance-Leahy Scale: Zero                             ADL either performed or assessed with clinical judgement   ADL Overall ADL's : Needs  assistance/impaired Eating/Feeding: NPO   Grooming: Maximal assistance   Upper Body Bathing: Maximal assistance   Lower Body Bathing: Total assistance   Upper Body Dressing : Maximal assistance   Lower Body Dressing: Total assistance   Toilet Transfer: +2 for physical assistance;Maximal assistance             General ADL Comments: pt  requires extensive (A) to power up into full extension to stand     Vision Baseline Vision/History: 1 Wears glasses (reading)       Perception     Praxis      Pertinent Vitals/Pain Pain Assessment Pain Assessment: No/denies pain     Hand Dominance Right   Extremity/Trunk Assessment Upper Extremity Assessment Upper Extremity Assessment: RUE deficits/detail RUE Deficits / Details: flaccid without activation. pt attempting to clap with L UE against R UE without any activation noted. pt looking at R UE but lacks awarness to lack of movement RUE Coordination: decreased fine motor;decreased gross motor   Lower Extremity Assessment Lower Extremity Assessment: Defer to PT evaluation   Cervical / Trunk Assessment Cervical / Trunk Assessment: Normal   Communication Communication Communication: Expressive difficulties   Cognition Arousal/Alertness: Awake/alert Behavior During Therapy: WFL for tasks assessed/performed, Impulsive Overall Cognitive Status: Difficult to assess Area of Impairment: Following commands                       Following Commands: Follows one step commands consistently       General Comments: no family present and currently with expressive deficits. pt mouthing the words when counting and attempting to clap hands but without sound. pt (A) with holding R UE up to clap and pt telling PT to "let me do it" . pt lacks awareness to R side deficits     General Comments  HR 138 max with standing. on RA    Exercises Exercises: Other exercises Other Exercises Other Exercises: attempting to engage R UE with LUE touching it, clapping task, reaching task- no activation noted   Shoulder Instructions      Home Living Family/patient expects to be discharged to:: Private residence Living Arrangements: Other relatives Available Help at Discharge: Family;Available PRN/intermittently Type of Home: House Home Access: Level entry     Home Layout: One  level     Bathroom Shower/Tub: Chief Strategy Officer: Standard     Home Equipment: None          Prior Functioning/Environment Prior Level of Function : Independent/Modified Independent             Mobility Comments: pt reports independence, difficult to fully understand but denies use of DME ADLs Comments: reports he is independent but not driving, uses a bike if needed        OT Problem List: Decreased strength;Decreased activity tolerance;Impaired balance (sitting and/or standing);Impaired vision/perception;Decreased coordination;Decreased cognition;Decreased safety awareness;Decreased knowledge of use of DME or AE;Decreased knowledge of precautions;Impaired sensation;Impaired UE functional use;Decreased range of motion      OT Treatment/Interventions: Self-care/ADL training;Therapeutic exercise;Neuromuscular education;Energy conservation;DME and/or AE instruction;Manual therapy;Modalities;Therapeutic activities;Splinting;Cognitive remediation/compensation;Visual/perceptual remediation/compensation;Patient/family education;Balance training    OT Goals(Current goals can be found in the care plan section) Acute Rehab OT Goals Patient Stated Goal: none stated by patient OT Goal Formulation: Patient unable to participate in goal setting Time For Goal Achievement: 12/04/21 Potential to Achieve Goals: Good  OT Frequency: Min 2X/week    Co-evaluation PT/OT/SLP Co-Evaluation/Treatment: Yes Reason for Co-Treatment: Necessary to address cognition/behavior during functional  activity   OT goals addressed during session: ADL's and self-care;Proper use of Adaptive equipment and DME;Strengthening/ROM SLP goals addressed during session: Cognition;Communication    AM-PAC OT "6 Clicks" Daily Activity     Outcome Measure Help from another person eating meals?: A Lot Help from another person taking care of personal grooming?: A Lot Help from another person toileting,  which includes using toliet, bedpan, or urinal?: A Lot Help from another person bathing (including washing, rinsing, drying)?: A Lot Help from another person to put on and taking off regular upper body clothing?: A Lot Help from another person to put on and taking off regular lower body clothing?: A Lot 6 Click Score: 12   End of Session Equipment Utilized During Treatment: Gait belt Nurse Communication: Mobility status;Precautions  Activity Tolerance: Patient tolerated treatment well Patient left: in bed;with call bell/phone within reach;with bed alarm set  OT Visit Diagnosis: Unsteadiness on feet (R26.81);Muscle weakness (generalized) (M62.81);Hemiplegia and hemiparesis Hemiplegia - Right/Left: Right Hemiplegia - dominant/non-dominant: Dominant                Time: 1941-7408 OT Time Calculation (min): 19 min Charges:  OT General Charges $OT Visit: 1 Visit OT Evaluation $OT Eval Moderate Complexity: 1 Mod   Brynn, OTR/L  Acute Rehabilitation Services Office: [phone] .   Mateo Flow 11/20/2021, 1:52 PM

## 2021-11-20 NOTE — TOC CAGE-AID Note (Signed)
Transition of Care [facility]) - CAGE-AID Screening   Patient Details  Name: Brent Warren MRN: [id number] Date of Birth: September 20, 1960  [facility] CM/SW Contact:    Mearl Latin, LCSW Phone Number: 11/20/2021, 11:52 AM   Clinical Narrative: Patient disoriented and unable to participate in screening at this time.    CAGE-AID Screening: Substance Abuse Screening unable to be completed due to: : Patient unable to participate

## 2021-11-20 NOTE — Progress Notes (Addendum)
PROGRESS NOTE  Brent Warren [id number] DOB: [birth year] DOA: 11/18/2021 PCP: Ladell Pier, MD   LOS: 2 days   Brief Narrative / Interim history: 61 year old male with history of HTN, tobacco use, EtOH use who comes into the hospital with sudden onset slurred speech and right-sided weakness.  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  He was admitted to the neuro ICU, remained stable and transferred to the hospitalist service on 11/13.  He started to develop increased withdrawal symptoms on 11/13  Significant events: 11/11-admission to the hospital 11/13-transfer to hospitalist service  Significant imaging / results / micro data: 11/11, CT head-acute left basal ganglia hemorrhage with mild surrounding edema 11/11, MRI brain-sooner signs of acute hemorrhage in the left basal ganglia without acute infarct or mass lesions 11/13, CT head-pending  Subjective / 24h Interval events: Appears confused, difficult to understand.  Shaky and tremulous.  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  Neurology consulted and following patient.  He underwent stroke work-up, 2D echo shows EF 60-65%.  LDL is 62, A1c is 5.1. -PT/OT recommends CIR -Appreciate neurology follow-up  Active problems Alcohol abuse, alcohol withdrawal with delirium-patient confused this morning, tremulous.  Continue CIWA. -Vitamin B1 pending -Closely monitor, very high risk for DTs and withdrawal seizures  Elevated LFTs-due to alcohol abuse.  Improving  Essential hypertension-continue to monitor  Tobacco use, history of emphysema-resume home albuterol  PRN  Hyponatremia-mild, improving  Elevated MCV-check [id number] as well as folic acid  Scheduled Meds:  Chlorhexidine Gluconate Cloth  6 each Topical Daily   folic acid  1 mg Oral Daily   heparin injection (subcutaneous)  5,000 Units Subcutaneous Q8H   LORazepam  0-4 mg Intravenous Q4H   Followed by   LORazepam  0-4 mg Intravenous Q8H   multivitamin with minerals  1 tablet Oral Daily   pantoprazole  40 mg Oral Daily   senna-docusate  1 tablet Oral BID   sodium chloride flush  3 mL Intravenous Once   thiamine  100 mg Oral Daily   Or   thiamine  100 mg Intravenous Daily   Continuous Infusions: PRN Meds:.acetaminophen **OR** acetaminophen (TYLENOL) oral liquid 160 mg/5 mL **OR** acetaminophen, labetalol, LORazepam **OR** LORazepam  Current Outpatient Medications  Medication Instructions   albuterol (PROVENTIL) 2.5 mg, Nebulization, Every 6 hours PRN   albuterol (VENTOLIN HFA) 108 (90 Base) MCG/ACT inhaler 2 puffs, Inhalation, Every 6 hours PRN   amLODipine (NORVASC) 10 mg, Oral, Daily   budesonide-formoterol (SYMBICORT) 80-4.5 MCG/ACT inhaler Inhale 2 puffs into the lungs 2 (two) times daily   loperamide (IMODIUM A-D) 2 mg, Oral, 2 times daily PRN   meloxicam (MOBIC) 15 mg, Oral, Daily    Diet Orders (From admission, onward)     Start     Ordered   11/18/21 1334  Diet regular Room service appropriate? Yes with Assist; Fluid consistency: Thin  Diet effective now       Question Answer Comment  Room service appropriate? Yes with Assist   Fluid consistency: Thin      11/18/21 1333            DVT prophylaxis:  heparin injection 5,000 Units Start: 11/19/21 1400 SCD's Start: 11/18/21 1137   Lab Results  Component Value Date   PLT 214 11/19/2021      Code Status: Full Code  Family Communication: no family at bedside  Status is: Inpatient  Remains inpatient appropriate because: severity of illness  Level of care: Telemetry Medical  Consultants:   Neurology  Objective: Vitals:   11/20/21 0400 11/20/21 0500 11/20/21 0700 11/20/21 0800  BP: (!) 158/93 (!) 142/97 (!) 151/93 (!) 160/103  Pulse: 85 93 (!) 103 (!) 110  Resp: 19     Temp: (!) 97.2 F (36.2 C)   (!) 97.4 F (36.3 C)  TempSrc: Axillary   Axillary  SpO2: 98% 96%    Weight:        Intake/Output Summary (Last 24 hours) at 11/20/2021 1009 Last data filed at 11/19/2021 1800 Gross per 24 hour  Intake --  Output 650 ml  Net -650 ml   Wt Readings from Last 3 Encounters:  11/18/21 59.2 kg  07/06/21 58.5 kg  02/10/21 61.7 kg    Examination:  Constitutional: Tremulous, restless Eyes: no scleral icterus ENMT: Mucous membranes are moist.  Neck: normal, supple Respiratory: clear to auscultation bilaterally, no wheezing, no crackles. Normal respiratory effort. No accessory muscle use.  Cardiovascular: Regular rate and rhythm, no murmurs / rubs / gallops.  No peripheral edema Abdomen: non distended, no tenderness. Bowel sounds positive.  Musculoskeletal: no clubbing / cyanosis.  Skin: no rashes Neurologic: Right-sided weakness present   Data Reviewed: I have independently reviewed following labs and imaging studies   CBC Recent Labs  Lab 11/18/21 1130 11/18/21 1134 11/19/21 0231  WBC 7.5  --  5.7  HGB 13.6 15.0 13.5  HCT 38.8* 44.0 38.1*  PLT 224  --  214  MCV 104.6*  --  102.1*  MCH 36.7*  --  36.2*  MCHC 35.1  --  35.4  RDW 13.2  --  13.2  LYMPHSABS 2.3  --   --   MONOABS 0.7  --   --   EOSABS 0.2  --   --   BASOSABS 0.1  --   --     Recent Labs  Lab 11/18/21 1130 11/18/21 1134 11/19/21 0231  NA 132* 131* 134*  K 4.2 4.3 4.3  CL 96* 97* 99  CO2 21*  --  21*  GLUCOSE 93 88 99  BUN <5* 3* 7*  CREATININE 0.59* 0.80 0.65  CALCIUM 9.1  --  9.3  AST 76*  --  59*  ALT 38  --  34  ALKPHOS 58  --  53  BILITOT 0.5  --  0.7  ALBUMIN 4.0  --  3.8  MG  --   --  1.7  INR 1.0  --  1.0  HGBA1C  --   --  5.1     ------------------------------------------------------------------------------------------------------------------ Recent Labs    11/19/21 0231  CHOL 160  HDL 88  LDLCALC 62  TRIG 48  CHOLHDL 1.8    Lab Results  Component Value Date   HGBA1C 5.1 11/19/2021   ------------------------------------------------------------------------------------------------------------------ No results for input(s): "TSH", "T4TOTAL", "T3FREE", "THYROIDAB" in the last 72 hours.  Invalid input(s): "FREET3"  Cardiac Enzymes No results for input(s): "CKMB", "TROPONINI", "MYOGLOBIN" in the last 168 hours.  Invalid input(s): "CK" ------------------------------------------------------------------------------------------------------------------ No results found for: "BNP"  CBG: Recent Labs  Lab 11/18/21 1129  GLUCAP 95    Recent Results (from the past 240 hour(s))  MRSA Next  Gen by PCR, Nasal     Status: None   Collection Time: 11/18/21  1:13 PM   Specimen: Nasal Mucosa; Nasal Swab  Result Value Ref Range Status   MRSA by PCR Next Gen NOT DETECTED NOT DETECTED Final    Comment: (NOTE) The GeneXpert MRSA Assay (FDA approved for NASAL specimens only), is one component of a comprehensive MRSA colonization surveillance program. It is not intended to diagnose MRSA infection nor to guide or monitor treatment for MRSA infections. Test performance is not FDA approved in patients less than 34 years old. Performed at [facility], [address], [place], [place] [address]      Radiology Studies: No results found.   Pamella Pert, MD, PhD [facility]  Between 7 am - 7 pm I am available, please contact me via Amion (for emergencies) or Securechat (non urgent messages)  Between 7 pm - 7 am I am not available, please contact night coverage MD/APP via Amion

## 2021-11-20 NOTE — Progress Notes (Signed)
STROKE TEAM PROGRESS NOTE   SUBJECTIVE (INTERVAL HISTORY) Sister is at the bedside.  Overnight pt agitated and some hallucination. Now on posey belt and hand mittens. On ativan for CIWA. Worsening dysarthia and right sided weakness, repeat CT head showed stable hematoma.   OBJECTIVE Temp:  [97.2 F (36.2 C)-98.9 F (37.2 C)] 97.4 F (36.3 C) (11/13 0800) Pulse Rate:  [61-131] 93 (11/13 1100) Resp:  [13-31] 15 (11/13 1100) BP: (125-168)/(80-142) 150/98 (11/13 1100) SpO2:  [84 %-98 %] 97 % (11/13 1100)  Recent Labs  Lab 11/18/21 1129  GLUCAP 95   Recent Labs  Lab 11/18/21 1130 11/18/21 1134 11/19/21 0231  NA 132* 131* 134*  K 4.2 4.3 4.3  CL 96* 97* 99  CO2 21*  --  21*  GLUCOSE 93 88 99  BUN <5* 3* 7*  CREATININE 0.59* 0.80 0.65  CALCIUM 9.1  --  9.3  MG  --   --  1.7   Recent Labs  Lab 11/18/21 1130 11/19/21 0231  AST 76* 59*  ALT 38 34  ALKPHOS 58 53  BILITOT 0.5 0.7  PROT 8.0 7.7  ALBUMIN 4.0 3.8   Recent Labs  Lab 11/18/21 1130 11/18/21 1134 11/19/21 0231  WBC 7.5  --  5.7  NEUTROABS 4.3  --   --   HGB 13.6 15.0 13.5  HCT 38.8* 44.0 38.1*  MCV 104.6*  --  102.1*  PLT 224  --  214   No results for input(s): "CKTOTAL", "CKMB", "CKMBINDEX", "TROPONINI" in the last 168 hours. Recent Labs    11/18/21 1130 11/19/21 0231  LABPROT 12.7 13.1  INR 1.0 1.0   No results for input(s): "COLORURINE", "LABSPEC", "PHURINE", "GLUCOSEU", "HGBUR", "BILIRUBINUR", "KETONESUR", "PROTEINUR", "UROBILINOGEN", "NITRITE", "LEUKOCYTESUR" in the last 72 hours.  Invalid input(s): "APPERANCEUR"     Component Value Date/Time   CHOL 160 11/19/2021 0231   CHOL 153 10/10/2020 1650   TRIG 48 11/19/2021 0231   HDL 88 11/19/2021 0231   HDL 75 10/10/2020 1650   CHOLHDL 1.8 11/19/2021 0231   VLDL 10 11/19/2021 0231   LDLCALC 62 11/19/2021 0231   LDLCALC 62 10/10/2020 1650   Lab Results  Component Value Date   HGBA1C 5.1 11/19/2021      Component Value Date/Time    LABOPIA NONE DETECTED 08/04/2013 2132   COCAINSCRNUR NONE DETECTED 08/04/2013 2132   LABBENZ NONE DETECTED 08/04/2013 2132   AMPHETMU NONE DETECTED 08/04/2013 2132   THCU NONE DETECTED 08/04/2013 2132   LABBARB NONE DETECTED 08/04/2013 2132    Recent Labs  Lab 11/18/21 1130  ETH 201*    I have personally reviewed the radiological images below and agree with the radiology interpretations.  ECHOCARDIOGRAM COMPLETE  Result Date: 11/19/2021    ECHOCARDIOGRAM REPORT   Patient Name:   Dann Wittner Date of Exam: 11/19/2021 Medical Rec #:  [id number]   Height:       69.0 in Accession #:    [id number]  Weight:       130.5 lb Date of Birth:  April 13, 1960    BSA:          1.723 m Patient Age:    61 years    BP:           116/100 mmHg Patient Gender: M           HR:           66 bpm. Exam Location:  Inpatient Procedure: 2D Echo, Cardiac Doppler, Color Doppler and Intracardiac  Opacification Agent Indications:    Stroke  History:        Patient has prior history of Echocardiogram examinations, most                 recent 05/02/2017. Risk Factors:Hypertension and Current Smoker.                 ETOH abuse.  Sonographer:    Ross Ludwig RDCS (AE) Referring Phys: [facility]  Sonographer Comments: Technically difficult study due to poor echo windows, suboptimal parasternal window, suboptimal apical window and suboptimal subcostal window. IMPRESSIONS  1. Left ventricular ejection fraction, by estimation, is 60 to 65%. The left ventricle has normal function. The left ventricle has no regional wall motion abnormalities. Left ventricular diastolic parameters are consistent with Grade I diastolic dysfunction (impaired relaxation).  2. Right ventricular systolic function is normal. The right ventricular size is normal. Tricuspid regurgitation signal is inadequate for assessing PA pressure.  3. The mitral valve is grossly normal. No evidence of mitral valve regurgitation.  4. The aortic valve was not well  visualized. Aortic valve regurgitation is not visualized.  5. The inferior vena cava is normal in size with greater than 50% respiratory variability, suggesting right atrial pressure of 3 mmHg. Comparison(s): Changes from prior study are noted. 05/02/2017 (stress echo) - negative for ischemia, LVEF 50-55% at baseline. FINDINGS  Left Ventricle: Left ventricular ejection fraction, by estimation, is 60 to 65%. The left ventricle has normal function. The left ventricle has no regional wall motion abnormalities. Definity contrast agent was given IV to delineate the left ventricular  endocardial borders. The left ventricular internal cavity size was normal in size. There is no left ventricular hypertrophy. Left ventricular diastolic parameters are consistent with Grade I diastolic dysfunction (impaired relaxation). Indeterminate filling pressures. Right Ventricle: The right ventricular size is normal. No increase in right ventricular wall thickness. Right ventricular systolic function is normal. Tricuspid regurgitation signal is inadequate for assessing PA pressure. Left Atrium: Left atrial size was normal in size. Right Atrium: Right atrial size was normal in size. Pericardium: There is no evidence of pericardial effusion. Mitral Valve: The mitral valve is grossly normal. No evidence of mitral valve regurgitation. Tricuspid Valve: The tricuspid valve is not well visualized. Tricuspid valve regurgitation is not demonstrated. Aortic Valve: The aortic valve was not well visualized. Aortic valve regurgitation is not visualized. Aortic valve mean gradient measures 2.0 mmHg. Aortic valve peak gradient measures 4.4 mmHg. Pulmonic Valve: The pulmonic valve was not well visualized. Pulmonic valve regurgitation is not visualized. Aorta: The aortic root and ascending aorta are structurally normal, with no evidence of dilitation. Venous: The inferior vena cava is normal in size with greater than 50% respiratory variability,  suggesting right atrial pressure of 3 mmHg. IAS/Shunts: The interatrial septum appears to be lipomatous. No atrial level shunt detected by color flow Doppler.   Diastology LV e' medial:    4.35 cm/s LV E/e' medial:  12.6 LV e' lateral:   7.51 cm/s LV E/e' lateral: 7.3  RIGHT VENTRICLE             IVC RV Basal diam:  3.30 cm     IVC diam: 1.40 cm RV S prime:     13.50 cm/s TAPSE (M-mode): 1.5 cm LEFT ATRIUM           Index       RIGHT ATRIUM           Index LA Vol (A4C): 15.8 ml  9.17 ml/m  RA Area:     13.20 cm                                   RA Volume:   34.30 ml  19.90 ml/m  AORTIC VALVE AV Vmax:           105.00 cm/s AV Vmean:          69.800 cm/s AV VTI:            0.198 m AV Peak Grad:      4.4 mmHg AV Mean Grad:      2.0 mmHg LVOT Vmax:         92.50 cm/s LVOT Vmean:        56.200 cm/s LVOT VTI:          0.157 m LVOT/AV VTI ratio: 0.79 MITRAL VALVE MV Area (PHT): 2.99 cm    SHUNTS MV Decel Time: 254 msec    Systemic VTI: 0.16 m MV E velocity: 54.80 cm/s MV A velocity: 74.80 cm/s MV E/A ratio:  0.73 Zoila Shutter MD Electronically signed by Zoila Shutter MD Signature Date/Time: 11/19/2021/2:04:56 PM    Final    CT HEAD WO CONTRAST  Result Date: 11/18/2021 CLINICAL DATA:  Hemorrhagic stroke. EXAM: CT HEAD WITHOUT CONTRAST TECHNIQUE: Contiguous axial images were obtained from the base of the skull through the vertex without intravenous contrast. RADIATION DOSE REDUCTION: This exam was performed according to the departmental dose-optimization program which includes automated exposure control, adjustment of the mA and/or kV according to patient size and/or use of iterative reconstruction technique. COMPARISON:  CT angiography dated 11/18/2021. FINDINGS: Brain: No significant interval change in the left basal ganglia hemorrhage. No new hemorrhage. There is mild age-related atrophy and chronic microvascular ischemic changes. No mass effect or midline shift. No extra-axial fluid collection. Vascular: No  hyperdense vessel or unexpected calcification. Skull: Normal. Negative for fracture or focal lesion. Sinuses/Orbits: The visualized paranasal sinuses and mastoid air cells are clear. There is deviation of the nose to the left. Cerumen noted in the external auditory canals bilaterally. Other: None IMPRESSION: 1. No significant interval change in the left basal ganglia hemorrhage. No new hemorrhage. 2. Mild age-related atrophy and chronic microvascular ischemic changes. Electronically Signed   By: Elgie Collard M.D.   On: 11/18/2021 19:34   MR BRAIN W WO CONTRAST  Result Date: 11/18/2021 CLINICAL DATA:  Neuro deficit, acute, stroke suspected EXAM: MRI HEAD WITHOUT AND WITH CONTRAST TECHNIQUE: Multiplanar, multiecho pulse sequences of the brain and surrounding structures were obtained without and with intravenous contrast. CONTRAST:  6mL GADAVIST GADOBUTROL 1 MMOL/ML IV SOLN COMPARISON:  CT head from the same day. FINDINGS: Brain: When comparing across modalities, similar size of acute hemorrhage in the left basal ganglia. Mild surrounding edema. No significant mass effect. No midline shift. No surrounding restricted diffusion to suggest acute fracture. No visible mass lesion; however, acute blood products limit assessment. Patchy T2/FLAIR hyperintensity in the white matter, nonspecific but compatible with chronic microvascular disease. Cerebral atrophy. No hydrocephalus. No pathologic enhancement. Vascular: Major arterial flow voids are maintained skull base. Skull and upper cervical spine: Normal marrow signal. Sinuses/Orbits: Clear sinuses.  No acute orbital findings. Other: No mastoid effusions. IMPRESSION: When comparing across modalities, similar size of acute hemorrhage in the left basal ganglia. No visible acute infarct or mass lesion; however, acute blood products limits assessment. Electronically Signed   By:  Feliberto HartsFrederick S Jones M.D.   On: 11/18/2021 14:50   CT ANGIO HEAD NECK W WO CM (CODE  STROKE)  Result Date: 11/18/2021 CLINICAL DATA:  Code stroke. [age] year male with weakness. Acute left basal ganglia hemorrhage on plain CT. EXAM: CT ANGIOGRAPHY HEAD AND NECK TECHNIQUE: Multidetector CT imaging of the head and neck was performed using the standard protocol during bolus administration of intravenous contrast. Multiplanar CT image reconstructions and MIPs were obtained to evaluate the vascular anatomy. Carotid stenosis measurements (when applicable) are obtained utilizing NASCET criteria, using the distal internal carotid diameter as the denominator. RADIATION DOSE REDUCTION: This exam was performed according to the departmental dose-optimization program which includes automated exposure control, adjustment of the mA and/or kV according to patient size and/or use of iterative reconstruction technique. CONTRAST:  75mL OMNIPAQUE IOHEXOL 350 MG/ML SOLN COMPARISON:  Plain head CT 1132 hours today. FINDINGS: CTA NECK Skeleton: Cervical spine degeneration with developing degenerative appearing upper cervical multilevel posterior element ankylosis. No acute or suspicious osseous lesion identified. Upper chest: Centrilobular and paraseptal emphysema. Negative visible superior mediastinum. Other neck: Chronic nasal bone fractures. No acute finding identified. Aortic arch: 3 vessel arch configuration. Mild Calcified aortic atherosclerosis. Right carotid system: Brachiocephalic artery and proximal right CCA are negative. Minimal plaque in the distal right CCA and at the bifurcation primarily affecting the ECA. No stenosis. Left carotid system: Intermittent mild left CCA plaque before the bifurcation without stenosis. Mild calcified plaque at the bifurcation more affecting the ECA. No stenosis. Vertebral arteries: Proximal right subclavian artery and right vertebral artery origin plaque with only mild vertebral origin stenosis (series 8, image 103). Right vertebral appears somewhat non dominant but patent to  the skull base without additional stenosis. Proximal left subclavian artery plaque with no significant stenosis. Mild plaque at the left vertebral artery origin with mild if any stenosis. Mildly dominant left vertebral artery is patent to the skull base with no additional stenosis. CTA HEAD Posterior circulation: Dominant left V4 segment especially distal to the patent right PICA origin. Normal left PICA. Mild left V4 calcified plaque. No significant distal vertebral or vertebrobasilar junction stenosis. Patent basilar artery without stenosis. Patent SCA and PCA origins. Right PCA branches are within normal limits. Left PCA mild P2 segment irregularity with no significant stenosis. Anterior circulation: Both ICA siphons are patent. Mild, mostly supraclinoid calcified plaque bilaterally. Mild supraclinoid ICA stenosis more pronounced on the right. Patent carotid termini. Patent MCA and ACA origins. Anterior communicating artery and ACA branches are within normal limits. The left A2 appears to be dominant. Right MCA M1 segment and bifurcation are patent without stenosis. Right MCA branches are within normal limits. No left hemisphere CTA spot sign. Left MCA M1 segment is tortuous without stenosis. Patent left MCA bifurcation without stenosis. Left MCA branches are patent with some mild M2 and M3 irregularity in the middle sylvian division. Venous sinuses: Early contrast timing, not well evaluated. Anatomic variants: Dominant left vertebral artery. Dominant left ACA A2. Review of the MIP images confirms the above findings IMPRESSION: 1. No CTA spot sign in association with left basal ganglia hemorrhage. Negative for large vessel occlusion. 2. Generally mild atherosclerosis in the head and neck. No hemodynamically significant stenosis. 3. Cervical spine degeneration. Aortic Atherosclerosis (ICD10-I70.0) and Emphysema (ICD10-J43.9). Salient findings were communicated to Dr. Derry LoryKhaliqdina at 12:05 pm on 11/18/2021 by text  page via the [facility] messaging system. Electronically Signed   By: Odessa FlemingH  Hall M.D.   On: 11/18/2021 12:05   CT  HEAD CODE STROKE WO CONTRAST  Result Date: 11/18/2021 CLINICAL DATA:  Code stroke.  61 year male with weakness. EXAM: CT HEAD WITHOUT CONTRAST TECHNIQUE: Contiguous axial images were obtained from the base of the skull through the vertex without intravenous contrast. RADIATION DOSE REDUCTION: This exam was performed according to the departmental dose-optimization program which includes automated exposure control, adjustment of the mA and/or kV according to patient size and/or use of iterative reconstruction technique. COMPARISON:  Head CT 08/24/2010. FINDINGS: Brain: Globular hyperdense hemorrhage at the left basal ganglia, centered at the lentiform encompasses 19 x 22 x 21 mm (AP by transverse by CC), estimated volume 4 mL. Mild surrounding edema. No significant mass effect. No intraventricular or extra-axial extension of blood. Generalized cerebral volume loss since 2012 seems advanced for age. No disproportionate ventriculomegaly. No midline shift. Patchy additional bilateral cerebral white matter hypodensity. No superimposed cortically based acute infarct identified. Vascular: Calcified atherosclerosis at the skull base. No suspicious intracranial vascular hyperdensity. Skull: No acute osseous abnormality identified. Sinuses/Orbits: Visualized paranasal sinuses and mastoids are clear. Other: Visualized orbits and scalp soft tissues are within normal limits. ASPECTS [facility] Stroke Program Early CT Score) Total score (0-10 with 10 being normal): Not applicable, acute hemorrhage Study discussed by telephone with Dr. Derry Lory on 11/18/2021 at 11:46 . IMPRESSION: 1. 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 Mild surrounding edema. No significant mass effect. This was briefly discussed by telephone with Dr. Derry Lory on 11/18/2021 at 11:46. 2. Evidence of underlying chronic small vessel  disease. Generalized brain volume loss since 2012 seems advanced for age. 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 Electronically Signed   By: Odessa Fleming M.D.   On: 11/18/2021 11:49     PHYSICAL EXAM  Temp:  [97.2 F (36.2 C)-98.9 F (37.2 C)] 97.4 F (36.3 C) (11/13 0800) Pulse Rate:  [61-131] 93 (11/13 1100) Resp:  [13-31] 15 (11/13 1100) BP: (125-168)/(80-142) 150/98 (11/13 1100) SpO2:  [84 %-98 %] 97 % (11/13 1100)  General - thin built, well developed, mildly agitated in bed.  Ophthalmologic - fundi not visualized due to noncooperation.  Cardiovascular - Regular rhythm and rate.  Mental Status -  Severe dysarthria, with almost intangible words. However, he seems able to tell me his age but wrong on month.   Cranial Nerves II - XII - II - Visual field intact OU. III, IV, VI - Extraocular movements intact. V - Facial sensation decreased on the right. VII - Facial movement showed right facial droop. VIII - Hearing & vestibular intact bilaterally. X - Palate elevates symmetrically. XI - Chin turning & shoulder shrug intact bilaterally. XII - Tongue protrusion intact.  Motor Strength - The patient's strength was spontaneous movement against gravity at LUE and LLE and RUE deltoid 0/5, bicep 1/5, tricep 1/5. RLE proximal and distal 1/5.  Bulk was normal and fasciculations were absent.   Motor Tone - Muscle tone was assessed at the neck and appendages and was normal.  Reflexes - The patient's reflexes were symmetrical in all extremities and he had no pathological reflexes.  Sensory - Light touch, temperature/pinprick were not cooperative on exma  Coordination - not cooperative on exam.  Tremor was absent.  Gait and Station - deferred.   ASSESSMENT/PLAN Mr. Saahas Hidrogo is a 61 y.o. male with history of hypertension, smoker, heavy alcoholic admitted for right-sided numbness and weakness, right facial droop, slurred speech. No tPA given due to ICH.    ICH:  left BG ICH, likely due to  small  vessel disease given multiple uncontrolled risk factors CT showed left BG small ICH CTA head and neck unremarkable, no aneurysm or AVM MRI left BG small ICH CT repeat stable hematoma x 2 2D Echo EF 60 to 65% LDL 62 HgbA1c 5.1 UDS pending Heparin subcu for VTE prophylaxis No antithrombotic prior to admission, now on No antithrombotic given ICH Ongoing aggressive stroke risk factor management Therapy recommendations: CIR Disposition: Pending  Hypertension Stable on the high end Labetalol IV PRN Consider po BP meds once po access Long term BP goal normotensive  Tobacco abuse Current smoker Smoking cessation counseling provided Pt is willing to quit  Alcohol withdraw Heavy alcohol user, 6-10 beers every day On CIWA protocol Thiamine/folic acid/multivitamin Limitation of alcohol use education provided Overnight agitation, on ativan and restrain  Other Stroke Risk Factors   Other Active Problems   Hospital day # 2   Marvel Plan, MD PhD Stroke Neurology 11/20/2021 11:41 AM    To contact Stroke Continuity provider, please refer to [email or web address]. After hours, contact General Neurology

## 2021-11-20 NOTE — Evaluation (Signed)
Speech Language Pathology Evaluation Patient Details Name: Brent Warren MRN: [id number] DOB: 13-Jun-1960 Today's Date: 11/20/2021 Time: [id number] SLP Time Calculation (min) (ACUTE ONLY): 20 min  Problem List:  Patient Active Problem List   Diagnosis Date Noted   Alcohol withdrawal delirium ([place]) 11/20/2021   Elevated MCV 11/20/2021   ICH (intracerebral hemorrhage) ([place]) 11/18/2021   COVID-19 vaccine series completed 10/26/2019   Polyarthritis 02/05/2019   Chest pain in adult 04/02/2017   Immunization due 11/06/2016   Pulmonary emphysema ([place]) 11/06/2016   Urge incontinence 11/06/2016   Tobacco abuse 11/06/2016   ETOH abuse 11/06/2016   Lung nodule, multiple 11/08/2015   HTN (hypertension) 08/01/2015   Hyponatremia 05/22/2015   Hepatitis 05/22/2015   Past Medical History:  Past Medical History:  Diagnosis Date   Alcoholic ([place])    Chest pain 04/02/2017   COPD (chronic obstructive pulmonary disease) ([place])    Empyema of pleural space ([place]) 05/2015   ETOH abuse 11/06/2016   Hepatitis 05/22/2015   HTN (hypertension)    Immunization due 11/06/2016   Lung nodule, multiple 11/08/2015   Malnutrition of moderate degree 06/02/2015   Pneumonia 05/2015   Pulmonary emphysema ([place]) 11/06/2016   Tobacco abuse    Urge incontinence 11/06/2016   Past Surgical History:  Past Surgical History:  Procedure Laterality Date   arm surgery     DECORTICATION Right 06/03/2015   Procedure: DECORTICATION;  Surgeon: Melrose Nakayama, MD;  Location: [facility];  Service: Thoracic;  Laterality: Right;   VIDEO ASSISTED THORACOSCOPY (VATS)/EMPYEMA Right 06/03/2015   Procedure: VIDEO ASSISTED THORACOSCOPY (VATS)/EMPYEMA;  Surgeon: Melrose Nakayama, MD;  Location: [facility] OR;  Service: Thoracic;  Laterality: Right;   HPI:  61 year old male with history of HTN, tobacco use, EtOH use who comes into the hospital with sudden onset slurred speech and right-sided weakness.  On admission CT of the head showed left  basal ganglia ICH.   He started to develop increased withdrawal symptoms on 11/13   Assessment / Plan / Recommendation Clinical Impression  Pt demonstrates a severe dysarthria with right CN VII weakness and minimal movement of articulators due to ETOH withdrawal and resulting AMS. Pt is largely unintelligible with conversational speech and after any open ended question. Pts does appear to approximate articulation with known targets (his name, counting) though still listener comprehension is further impaired by pts cognitive impairments, inappropriate responses and absent awareness of deficits. When asked to clap pt does not recognize that his right hand is not participating in movement and will not allow therapist to provide hand over hand assist. Cues to improve articulation are largely unsuccessful as pt became more lethargic after sitting edge of bed and laying back down. Pt will benefit from SLP f/u for speech intelligiblity and cogntiion as mentation improves with treatment.    SLP Assessment  SLP Recommendation/Assessment: Patient needs continued Speech [place] Pathology Services SLP Visit Diagnosis: Dysarthria and anarthria (R47.1)    Recommendations for follow up therapy are one component of a multi-disciplinary discharge planning process, led by the attending physician.  Recommendations may be updated based on patient status, additional functional criteria and insurance authorization.    Follow Up Recommendations  Skilled nursing-short term rehab (<3 hours/day)    Assistance Recommended at Discharge  Frequent or constant Supervision/Assistance  Functional Status Assessment Patient has had a recent decline in their functional status and demonstrates the ability to make significant improvements in function in a reasonable and predictable amount of time.  Frequency and Duration min 2x/week  2 weeks      SLP Evaluation Cognition  Overall Cognitive Status: Impaired/Different from  baseline Arousal/Alertness: Lethargic Orientation Level: Oriented to person;Oriented to place Attention: Focused;Sustained Focused Attention: Appears intact Sustained Attention: Impaired Sustained Attention Impairment: Verbal basic;Functional basic Awareness: Impaired Awareness Impairment: Intellectual impairment;Emergent impairment Problem Solving: Impaired Problem Solving Impairment: Verbal basic Executive Function: Self Monitoring Self Monitoring: Impaired Self Monitoring Impairment: Verbal basic;Functional basic Safety/Judgment: Impaired       Comprehension  Auditory Comprehension Overall Auditory Comprehension: Appears within functional limits for tasks assessed    Expression Verbal Expression Overall Verbal Expression:  (difficult to assess due to severe dysarthria) Initiation: No impairment Automatic Speech: Name;Counting Level of Generative/Spontaneous Verbalization: Word;Phrase Repetition: No impairment Written Expression Dominant Hand: Right   Oral / Motor  Oral Motor/Sensory Function Overall Oral Motor/Sensory Function: Moderate impairment Facial ROM: Reduced right;Suspected CN VII (facial) dysfunction Facial Symmetry: Abnormal symmetry right;Suspected CN VII (facial) dysfunction Facial Strength: Reduced right;Suspected CN VII (facial) dysfunction Lingual ROM: Reduced right;Reduced left Lingual Symmetry: Within Functional Limits Lingual Strength: Reduced Motor Speech Overall Motor Speech: Impaired Respiration: Within functional limits Phonation: Low vocal intensity Resonance: Hypernasality Articulation: Impaired Level of Impairment: Word Intelligibility: Intelligibility reduced Word: 0-24% accurate Phrase: 0-24% accurate Sentence: 0-24% accurate Motor Planning: Witnin functional limits            Toribio Seiber, Riley Nearing 11/20/2021, 1:02 PM

## 2021-11-20 NOTE — Progress Notes (Signed)
Physical Therapy Treatment Patient Details Name: Brent Warren MRN: [id number] DOB: 1960-12-03 Today's Date: 11/20/2021   History of Present Illness 61 yo male presenting 11/11 with R-sided weakness, facial droop and slurred speech. 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 PMH includes: HTN, COPD, and alcohol abuse.    PT Comments    Pt seen with OT today as pt with regression in both cognition and function as pt is actively going through ETOH withdrawal. Pt requiring mod/maxAx2 today for transfers. Pt unable to sequence steps today as he did on initial PT eval. Pt with strong retropulsion and inability to follow tactile and verbal cues for safe mobility. Pt with decreased insight to safety and deficits, noted R inattention and R sided weakness, R UE worse than R LE. Acute PT to cont to follow. Continue to recommend AIR upon d/c for maximal functional recovery for safe transition home with brother.     Recommendations for follow up therapy are one component of a multi-disciplinary discharge planning process, led by the attending physician.  Recommendations may be updated based on patient status, additional functional criteria and insurance authorization.  Follow Up Recommendations  Acute inpatient rehab (3hours/day)     Assistance Recommended at Discharge Frequent or constant Supervision/Assistance  Patient can return home with the following A lot of help with walking and/or transfers;A lot of help with bathing/dressing/bathroom;Assistance with cooking/housework;Direct supervision/assist for medications management;Direct supervision/assist for financial management;Assist for transportation;Help with stairs or ramp for entrance   Equipment Recommendations   (defer to post acute)    Recommendations for Other Services Rehab consult     Precautions / Restrictions Precautions Precautions: Fall Precaution Comments: R hemi, SBP < 160, pt going through active withdrawls Restrictions Weight  Bearing Restrictions: No     Mobility  Bed Mobility Overal bed mobility: Needs Assistance Bed Mobility: Supine to Sit, Sit to Supine     Supine to sit: +2 for physical assistance, Mod assist Sit to supine: +2 for physical assistance, Mod assist   General bed mobility comments: pt needs (A) to sequence enter and exit of bed surface. Pt once eob with hip extension and needs (A) to sustain a hip flexion for safe sitting. bil le blocked at EOB to keep pt stay as well. Pt needs (A) to lay on R side and bring bil LE onto the bed surface. total +2 total to scoot toward the [facility]. pt does lift his head on command to help    Transfers Overall transfer level: Needs assistance Equipment used: 2 person hand held assist Transfers: Sit to/from Stand Sit to Stand: +2 physical assistance, Max assist           General transfer comment: pt on first attempt with extension of hips and knees with retropulsion and trunk flexed posture. pt requires bil LE blocked. pt unsteady standing. pt returned to sitting and second attempt. pt with increased up right trunk extension. pt total (A) to scoot R LE toward HOB and stepping with total+2 max weight shfit to move the LLE. pt required maxA to proving hip extension at posterior pelvis on R side with tactile cues at chest for upright posture with bilat knees block    Ambulation/Gait               General Gait Details: pt unable to this date due to impaired cognition and inability to sequence steps   Optometrist  Modified Rankin (Stroke Patients Only) Modified Rankin (Stroke Patients Only) Pre-Morbid Rankin Score: No symptoms Modified Rankin: Moderately severe disability     Balance Overall balance assessment: Needs assistance Sitting-balance support: Single extremity supported, Feet supported Sitting balance-Leahy Scale: Poor Sitting balance - Comments: falling posteriorly, minA to minG Postural control:  Posterior lean Standing balance support: Single extremity supported, During functional activity, Reliant on assistive device for balance Standing balance-Leahy Scale: Zero Standing balance comment: LOB in all planes, modA through HHA to maintain upright                            Cognition Arousal/Alertness: Awake/alert Behavior During Therapy: Impulsive Overall Cognitive Status: Impaired/Different from baseline Area of Impairment: Following commands                       Following Commands: Follows one step commands consistently, Follows one step commands with increased time       General Comments: no family present and currently with expressive deficits. pt mouthing the words when counting and attempting to clap hands but without sound. pt (A) with holding R UE up to clap and pt telling PT to "let me do it" . pt lacks awareness to R side deficits, R inattention, now in posey belt due to restlessness and further impaired cognition due to active withdraws        Exercises      General Comments General comments (skin integrity, edema, etc.): HR 138 max with standing      Pertinent Vitals/Pain Pain Assessment Pain Assessment: Faces Faces Pain Scale: No hurt    Home Living Family/patient expects to be discharged to:: Private residence Living Arrangements: Other relatives Available Help at Discharge: Family;Available PRN/intermittently Type of Home: House Home Access: Level entry       Home Layout: One level Home Equipment: None      Prior Function            PT Goals (current goals can now be found in the care plan section) Acute Rehab PT Goals PT Goal Formulation: With patient Time For Goal Achievement: 12/03/21 Potential to Achieve Goals: Good Progress towards PT goals: Not progressing toward goals - comment (going into ETOH withdrawls)    Frequency    Min 4X/week      PT Plan Current plan remains appropriate    Co-evaluation  PT/OT/SLP Co-Evaluation/Treatment: Yes Reason for Co-Treatment: Necessary to address cognition/behavior during functional activity PT goals addressed during session: Mobility/safety with mobility OT goals addressed during session: ADL's and self-care;Proper use of Adaptive equipment and DME;Strengthening/ROM SLP goals addressed during session: Cognition;Communication    AM-PAC PT "6 Clicks" Mobility   Outcome Measure  Help needed turning from your back to your side while in a flat bed without using bedrails?: A Lot Help needed moving from lying on your back to sitting on the side of a flat bed without using bedrails?: A Lot Help needed moving to and from a bed to a chair (including a wheelchair)?: A Lot Help needed standing up from a chair using your arms (e.g., wheelchair or bedside chair)?: A Lot Help needed to walk in hospital room?: Total Help needed climbing 3-5 steps with a railing? : Total 6 Click Score: 10    End of Session Equipment Utilized During Treatment: Gait belt Activity Tolerance: Patient tolerated treatment well Patient left: in bed;with call bell/phone within reach;with bed alarm set;with restraints reapplied (posey belt  placed back on) Nurse Communication: Mobility status PT Visit Diagnosis: Other abnormalities of gait and mobility (R26.89);Muscle weakness (generalized) (M62.81);Hemiplegia and hemiparesis Hemiplegia - Right/Left: Right Hemiplegia - dominant/non-dominant: Dominant Hemiplegia - caused by: Nontraumatic intracerebral hemorrhage     Time: 1135-1200 PT Time Calculation (min) (ACUTE ONLY): 25 min  Charges:  $Therapeutic Activity: 8-22 mins                     Lewis Shock, PT, DPT Acute Rehabilitation Services Secure chat preferred Office #: [phone]    Iona Hansen 11/20/2021, 2:20 PM

## 2021-11-20 NOTE — Progress Notes (Signed)
? ?  Inpatient Rehab Admissions Coordinator : ? ?Per therapy recommendations, patient was screened for CIR candidacy by Amandine Covino RN MSN.  At this time patient appears to be a potential candidate for CIR. I will place a rehab consult per protocol for full assessment. Please call me with any questions. ? ?Demarrius Guerrero RN MSN ?Admissions Coordinator ?[phone] ?  ?

## 2021-11-20 NOTE — Consult Note (Signed)
NAME:  Brent Warren, MRN:  [id number], DOB:  1960/11/22, LOS: 2 ADMISSION DATE:  11/18/2021, CONSULTATION DATE: 11/13 REFERRING MD: Dr. Cruzita Lederer, CHIEF COMPLAINT: Alcohol withdrawal  History of Present Illness:  61 year old male with past medical history as below, which significant for COPD, alcohol use (6-10 beers per day), empyema, and tobacco abuse.  He presented to [facility] emergency department on 11/11 with complaints of sudden onset slurred speech, right-sided weakness, and right facial droop.  Last known normal at 830 that morning.  Did endorse drinking alcohol that morning as well.  He was transported to the ED as a code stroke and initial CT demonstrated left basal ganglia ICH. He has been followed by the stroke service and [facility]. Course complicated by alcohol withdrawal. PCCM asked to see due to frequent CIWA ativan dosing.   Pertinent  Medical History   has a past medical history of Alcoholic ([place]), Chest pain (04/02/2017), COPD (chronic obstructive pulmonary disease) ([place]), Empyema of pleural space ([place]) (05/2015), ETOH abuse (11/06/2016), Hepatitis (05/22/2015), HTN (hypertension), Immunization due (11/06/2016), Lung nodule, multiple (11/08/2015), Malnutrition of moderate degree (06/02/2015), Pneumonia (05/2015), Pulmonary emphysema ([place]) (11/06/2016), Tobacco abuse, and Urge incontinence (11/06/2016).   Significant Hospital Events: Including procedures, antibiotic start and stop dates in addition to other pertinent events   11/11 admit with ICH 11/13 worsening ETOH wd symptoms. PCCM consult.   Interim History / Subjective:    Objective   Blood pressure (!) 150/98, pulse 93, temperature (!) 97.4 F (36.3 C), temperature source Axillary, resp. rate 15, weight 59.2 kg, SpO2 97 %.        Intake/Output Summary (Last 24 hours) at 11/20/2021 1144 Last data filed at 11/20/2021 0800 Gross per 24 hour  Intake --  Output 870 ml  Net -870 ml   Filed Weights   11/18/21 1100  Weight:  59.2 kg    Examination: General: Elderly appearing male in NAD HENT: [place]/AT, PERRL, no JVD. MM moist Lungs: Clear bilateral breath sounds Cardiovascular: tachy, regular, no MRG Abdomen: Soft, non-tender, normoactive.  Extremities: No acute deformity.  Neuro: Spontaneously moving left, weak on the right. Dysarthric, slurring.   Resolved Hospital Problem list     Assessment & Plan:   Left basal ganglia ICH - management per stroke service.  - PT/OT/SLP - CT head read pending based on new dysarthria and weakness.   Alcohol withdrawal: Now s/p hourly dosing of benzodiazepines per CIWA and continues to have significant symptoms. 6-10 beers per day. - Start phenobarbital - Continue CIWA ativan - OK to monitor in ICU overnight considering risk of worsening to require infusions.  - Thiamine, folate, MVI  COPD without acute exacerbation - Symbicort at home, will not be able to use in his current state - Budesonide, Duonebs  Hypertension: - per primary  Best Practice (right click and "Reselect all SmartList Selections" daily)   Diet/type: NPO DVT prophylaxis: prophylactic heparin  GI prophylaxis: PPI Lines: N/A Foley:  N/A Code Status:  full code Last date of multidisciplinary goals of care discussion [ ]   Labs   CBC: Recent Labs  Lab 11/18/21 1130 11/18/21 1134 11/19/21 0231  WBC 7.5  --  5.7  NEUTROABS 4.3  --   --   HGB 13.6 15.0 13.5  HCT 38.8* 44.0 38.1*  MCV 104.6*  --  102.1*  PLT 224  --  [id number]    Basic Metabolic Panel: Recent Labs  Lab 11/18/21 1130 11/18/21 1134 11/19/21 0231  NA 132* 131* 134*  K 4.2  4.3 4.3  CL 96* 97* 99  CO2 21*  --  21*  GLUCOSE 93 88 99  BUN <5* 3* 7*  CREATININE 0.59* 0.80 0.65  CALCIUM 9.1  --  9.3  MG  --   --  1.7   GFR: CrCl cannot be calculated (Unknown ideal weight.). Recent Labs  Lab 11/18/21 1130 11/19/21 0231  WBC 7.5 5.7    Liver Function Tests: Recent Labs  Lab 11/18/21 1130 11/19/21 0231  AST 76*  59*  ALT 38 34  ALKPHOS 58 53  BILITOT 0.5 0.7  PROT 8.0 7.7  ALBUMIN 4.0 3.8   No results for input(s): "LIPASE", "AMYLASE" in the last 168 hours. No results for input(s): "AMMONIA" in the last 168 hours.  ABG    Component Value Date/Time   PHART 7.420 06/04/2015 0355   PCO2ART 33.7 (L) 06/04/2015 0355   PO2ART 75.8 (L) 06/04/2015 0355   HCO3 21.5 06/04/2015 0355   TCO2 22 11/18/2021 1134   ACIDBASEDEF 2.4 (H) 06/04/2015 0355   O2SAT 94.7 06/04/2015 0355     Coagulation Profile: Recent Labs  Lab 11/18/21 1130 11/19/21 0231  INR 1.0 1.0    Cardiac Enzymes: No results for input(s): "CKTOTAL", "CKMB", "CKMBINDEX", "TROPONINI" in the last 168 hours.  HbA1C: Hgb A1c MFr Bld  Date/Time Value Ref Range Status  11/19/2021 02:31 AM 5.1 4.8 - 5.6 % Final    Comment:    (NOTE) Pre diabetes:          5.7%-6.4%  Diabetes:              >6.4%  Glycemic control for   <7.0% adults with diabetes   09/12/2017 03:27 PM 5.3 4.8 - 5.6 % Final    Comment:             Prediabetes: 5.7 - 6.4          Diabetes: >6.4          Glycemic control for adults with diabetes: <7.0     CBG: Recent Labs  Lab 11/18/21 1129  GLUCAP 95    Review of Systems:   Patient is encephalopathic and/or intubated. Therefore history has been obtained from chart review.   Past Medical History:  He,  has a past medical history of Alcoholic ([place]), Chest pain (04/02/2017), COPD (chronic obstructive pulmonary disease) ([place]), Empyema of pleural space ([place]) (05/2015), ETOH abuse (11/06/2016), Hepatitis (05/22/2015), HTN (hypertension), Immunization due (11/06/2016), Lung nodule, multiple (11/08/2015), Malnutrition of moderate degree (06/02/2015), Pneumonia (05/2015), Pulmonary emphysema ([place]) (11/06/2016), Tobacco abuse, and Urge incontinence (11/06/2016).   Surgical History:   Past Surgical History:  Procedure Laterality Date   arm surgery     DECORTICATION Right 06/03/2015   Procedure: DECORTICATION;   Surgeon: Melrose Nakayama, MD;  Location: [facility];  Service: Thoracic;  Laterality: Right;   VIDEO ASSISTED THORACOSCOPY (VATS)/EMPYEMA Right 06/03/2015   Procedure: VIDEO ASSISTED THORACOSCOPY (VATS)/EMPYEMA;  Surgeon: Melrose Nakayama, MD;  Location: [facility];  Service: Thoracic;  Laterality: Right;     Social History:   reports that he has been smoking cigarettes. He started smoking about 6 years ago. He has a 47.00 pack-year smoking history. He has never used smokeless tobacco. He reports current alcohol use of about 42.0 standard drinks of alcohol per week. He reports that he does not use drugs.   Family History:  His family history includes Cirrhosis in his father; Heart attack in his brother and mother; Stroke in his sister.  Allergies No Known Allergies   Home Medications  Prior to Admission medications   Medication Sig Start Date End Date Taking? Authorizing Provider  albuterol (PROVENTIL) (2.5 MG/3ML) 0.083% nebulizer solution Take 3 mLs (2.5 mg total) by nebulization every 6 (six) hours as needed for wheezing or shortness of breath. 07/06/21  Yes Marcine Matar, MD  albuterol (VENTOLIN HFA) 108 (90 Base) MCG/ACT inhaler Inhale 2 puffs into the lungs every 6 (six) hours as needed for wheezing or shortness of breath. 11/03/21  Yes Marcine Matar, MD  amLODipine (NORVASC) 10 MG tablet Take 1 tablet (10 mg total) by mouth daily. 02/10/21  Yes Marcine Matar, MD  budesonide-formoterol [facility]) 80-4.5 MCG/ACT inhaler Inhale 2 puffs into the lungs 2 (two) times daily 11/03/21  Yes Marcine Matar, MD  meloxicam (MOBIC) 15 MG tablet Take 1 tablet (15 mg total) by mouth daily. 11/03/21  Yes Marcine Matar, MD  loperamide (IMODIUM A-D) 2 MG tablet Take 1 tablet (2 mg total) by mouth 2 (two) times daily as needed for diarrhea or loose stools. Patient not taking: Reported on 11/18/2021 10/06/18   Marcine Matar, MD      Critical care time      Joneen Roach,  AGACNP-BC  Pulmonary & Critical Care  See Amion for personal pager PCCM on call pager [phone] until 7pm. Please call Elink 7p-7a. [phone]  11/20/2021 12:13 PM

## 2021-11-20 NOTE — Evaluation (Signed)
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   ETOH abuse 11/06/2016   Hepatitis 05/22/2015   HTN (hypertension)    Immunization due 11/06/2016   Lung nodule, multiple 11/08/2015   Malnutrition of moderate degree 06/02/2015   Pneumonia 05/2015   Pulmonary emphysema (HCC) 11/06/2016   Tobacco abuse    Urge incontinence 11/06/2016   Past Surgical History:  Past Surgical History:  Procedure Laterality Date   arm surgery     DECORTICATION Right 06/03/2015   Procedure: DECORTICATION;  Surgeon: [name], MD;  Location: [facility] OR;  Service: Thoracic;  Laterality: Right;   VIDEO ASSISTED THORACOSCOPY (VATS)/EMPYEMA Right 06/03/2015   Procedure: VIDEO ASSISTED THORACOSCOPY (VATS)/EMPYEMA;  Surgeon: [name], MD;  Location: [facility] OR;  Service: Thoracic;  Laterality: Right;   HPI:  61 year old male with history of HTN, tobacco use, EtOH use who comes into the hospital with sudden onset slurred speech and right-sided weakness.  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   He started to develop increased withdrawal symptoms on 11/13    Assessment / Plan / Recommendation  Clinical Impression  Pt demosntrates signs of aspiration when given ice, small sip of water and one bite of puree. Pt had coughing, wet vocal quality and multiple swallows. He has left CN VII deficits now with significant AMS. Pt is not safe for any oral intake and will have potential for prolonged periods of lethargy given treatment needed for ETOH. Pt recommended to remain NPO with  COrtrak until mention recovers. Will f/u for trials. SLP Visit Diagnosis: Dysphagia, unspecified (R13.10)    Aspiration Risk  Severe aspiration risk    Diet Recommendation NPO;Alternative means - temporary        Other  Recommendations      Recommendations for follow up therapy are one component of a multi-disciplinary discharge planning process, led by the attending physician.  Recommendations may be updated based on patient status, additional functional criteria and insurance authorization.  Follow up Recommendations Skilled nursing-short term rehab (<3 hours/day)      Assistance Recommended at Discharge Frequent or constant Supervision/Assistance  Functional Status Assessment    Frequency and Duration min 2x/week  2 weeks       Prognosis        Swallow Study   General HPI: 61 year old male with history of HTN, tobacco use, EtOH use who comes into the hospital with sudden onset slurred speech and right-sided weakness.  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   He started to develop increased withdrawal symptoms on 11/13 Type of Study: Bedside Swallow Evaluation Diet Prior to this Study: NPO Temperature Spikes Noted: No Respiratory Status: Room air History of Recent Intubation: No Behavior/Cognition: Alert;Distractible;Requires cueing Oral Cavity Assessment: Dry Oral Care Completed by SLP: No    Oral/Motor/Sensory Function Overall Oral Motor/Sensory Function: Within functional limits   Ice Chips Ice chips: Impaired Presentation: Spoon Oral Phase Functional Implications: Prolonged oral transit Pharyngeal Phase Impairments: Cough - Immediate;Cough - Delayed;Wet Vocal Quality;Multiple swallows   Thin Liquid Thin Liquid:  Impaired Pharyngeal  Phase Impairments: Cough - Delayed;Multiple swallows;Wet Vocal Quality    Nectar Thick Nectar Thick Liquid: Not tested   Honey Thick Honey Thick Liquid: Not tested   Puree Puree: Impaired Pharyngeal Phase  Impairments: Multiple swallows;Wet Vocal Quality   Solid     Solid: Not tested      Marcos Ruelas, Katherene Ponto 11/20/2021,12:47 PM

## 2021-11-21 DIAGNOSIS — F10931 Alcohol use, unspecified with withdrawal delirium: Secondary | ICD-10-CM | POA: Diagnosis not present

## 2021-11-21 DIAGNOSIS — E871 Hypo-osmolality and hyponatremia: Secondary | ICD-10-CM | POA: Diagnosis not present

## 2021-11-21 DIAGNOSIS — I10 Essential (primary) hypertension: Secondary | ICD-10-CM | POA: Diagnosis not present

## 2021-11-21 DIAGNOSIS — F17200 Nicotine dependence, unspecified, uncomplicated: Secondary | ICD-10-CM | POA: Diagnosis not present

## 2021-11-21 DIAGNOSIS — I610 Nontraumatic intracerebral hemorrhage in hemisphere, subcortical: Secondary | ICD-10-CM | POA: Diagnosis not present

## 2021-11-21 DIAGNOSIS — F1010 Alcohol abuse, uncomplicated: Secondary | ICD-10-CM | POA: Diagnosis not present

## 2021-11-21 DIAGNOSIS — J439 Emphysema, unspecified: Secondary | ICD-10-CM | POA: Diagnosis not present

## 2021-11-21 LAB — CBC
HCT: 45.7 % (ref 39.0–52.0)
Hemoglobin: 15.3 g/dL (ref 13.0–17.0)
MCH: 35.5 pg — ABNORMAL HIGH (ref 26.0–34.0)
MCHC: 33.5 g/dL (ref 30.0–36.0)
MCV: 106 fL — ABNORMAL HIGH (ref 80.0–100.0)
Platelets: 185 10*3/uL (ref 150–400)
RBC: 4.31 MIL/uL (ref 4.22–5.81)
RDW: 12.9 % (ref 11.5–15.5)
WBC: 7.9 10*3/uL (ref 4.0–10.5)
nRBC: 0 % (ref 0.0–0.2)

## 2021-11-21 LAB — COMPREHENSIVE METABOLIC PANEL
ALT: 31 U/L (ref 0–44)
AST: 56 U/L — ABNORMAL HIGH (ref 15–41)
Albumin: 4 g/dL (ref 3.5–5.0)
Alkaline Phosphatase: 61 U/L (ref 38–126)
Anion gap: 13 (ref 5–15)
BUN: 8 mg/dL (ref 8–23)
CO2: 20 mmol/L — ABNORMAL LOW (ref 22–32)
Calcium: 9.8 mg/dL (ref 8.9–10.3)
Chloride: 101 mmol/L (ref 98–111)
Creatinine, Ser: 0.69 mg/dL (ref 0.61–1.24)
GFR, Estimated: >60 mL/min (ref >60)
Glucose, Bld: 95 mg/dL (ref 70–99)
Potassium: 4 mmol/L (ref 3.5–5.1)
Sodium: 134 mmol/L — ABNORMAL LOW (ref 135–145)
Total Bilirubin: 0.6 mg/dL (ref 0.3–1.2)
Total Protein: 8.3 g/dL — ABNORMAL HIGH (ref 6.5–8.1)

## 2021-11-21 LAB — MAGNESIUM: Magnesium: 1.8 mg/dL (ref 1.7–2.4)

## 2021-11-21 MED ORDER — DEXTROSE-NACL 5-0.9 % IV SOLN: INTRAVENOUS | Status: DC

## 2021-11-21 MED ORDER — DEXTROSE-NACL 5-0.45 % IV SOLN: INTRAVENOUS | Status: DC

## 2021-11-21 MED ORDER — FOLIC ACID 5 MG/ML IJ SOLN
1.0000 mg | Freq: Every day | INTRAMUSCULAR | Status: DC
  Administered 2021-11-21 – 2021-11-22 (×2): 1 mg via INTRAVENOUS
  Filled 2021-11-21 (×2): qty 0.2

## 2021-11-21 MED ORDER — ORAL CARE MOUTH RINSE: 15.0000 mL | OROMUCOSAL | Status: DC | PRN

## 2021-11-21 MED ORDER — IPRATROPIUM-ALBUTEROL 0.5-2.5 (3) MG/3ML IN SOLN: 3.0000 mL | RESPIRATORY_TRACT | Status: DC | PRN

## 2021-11-21 NOTE — Progress Notes (Signed)
   NAME:  Brent Warren, MRN:  [id number], DOB:  17-Nov-1960, LOS: 3 ADMISSION DATE:  11/18/2021, CONSULTATION DATE: 11/13 REFERRING MD: Dr. Elvera Lennox, CHIEF COMPLAINT: Alcohol withdrawal  History of Present Illness:  61 year old male with past medical history as below, which significant for COPD, alcohol use (6-10 beers per day), empyema, and tobacco abuse.  He presented to [facility] emergency department on 11/11 with complaints of sudden onset slurred speech, right-sided weakness, and right facial droop.  Last known normal at 830 that morning.  Did endorse drinking alcohol that morning as well.  He was transported to the ED as a code stroke and initial CT demonstrated left basal ganglia ICH. He has been followed by the stroke service and TR[facility]. Course complicated by alcohol withdrawal. PCCM asked to see due to frequent CIWA ativan dosing.   Pertinent  Medical History   has a past medical history of Alcoholic (HCC), Chest pain (04/02/2017), COPD (chronic obstructive pulmonary disease) (HCC), Empyema of pleural space (HCC) (05/2015), ETOH abuse (11/06/2016), Hepatitis (05/22/2015), HTN (hypertension), Immunization due (11/06/2016), Lung nodule, multiple (11/08/2015), Malnutrition of moderate degree (06/02/2015), Pneumonia (05/2015), Pulmonary emphysema (HCC) (11/06/2016), Tobacco abuse, and Urge incontinence (11/06/2016).   Significant Hospital Events: Including procedures, antibiotic start and stop dates in addition to other pertinent events   11/11 admit with ICH 11/13 worsening ETOH wd symptoms. PCCM consult.  CTH 11/13 > Unchanged intraparenchymal hematoma centered in the left basal ganglia  Interim History / Subjective:  Symptoms controlled much  better with addition of phenobarbital.  No acute events overnight No complaints.   Objective   Blood pressure 108/81, pulse 76, temperature (!) 97 F (36.1 C), temperature source Axillary, resp. rate 17, weight 59.2 kg, SpO2 96 %.        Intake/Output  Summary (Last 24 hours) at 11/21/2021 1000 Last data filed at 11/21/2021 0600 Gross per 24 hour  Intake 100 ml  Output 1030 ml  Net -930 ml    Filed Weights   11/18/21 1100  Weight: 59.2 kg    Examination: General: Elderly appearing male in NAD HENT: /AT, PERRL, no appreciable JVD Lungs: Clear bilateral breath sounds Cardiovascular: Tachy, regular, no MRG Abdomen: Soft, NT, ND Extremities: No acute deformity. Neuro: Sedate, but arouses to tactile stimulation to an agitated state. Dysarthric.  Resolved Hospital Problem list     Assessment & Plan:   Left basal ganglia ICH - management per stroke service.  - PT/OT/SLP  Alcohol withdrawal: Now s/p hourly dosing of benzodiazepines per CIWA and continues to have significant symptoms. 6-10 beers per day. - Phenobarbital taper - Continue CIWA ativan, DC standing ativan - OK to monitor in ICU  - Thiamine, folate, MVI  COPD without acute exacerbation - Symbicort at home, will not be able to use in his current state - Budesonide, Duonebs  Hypertension: - per primary. Borderline hypotension this morning. Place hold parameter on Norvasc. - PRN labetalol as he may not be able to take PO the next couple days.   Best Practice (right click and "Reselect all SmartList Selections" daily)   Diet/type: NPO DVT prophylaxis: prophylactic heparin  GI prophylaxis: PPI Lines: N/A Foley:  N/A Code Status:  full code Last date of multidisciplinary goals of care discussion [ ]     , AGACNP-BC [facility]  See Amion for personal pager PCCM on call pager [phone] until 7pm. Please call Elink 7p-7a. [phone]  11/21/2021 10:00 AM

## 2021-11-21 NOTE — Progress Notes (Signed)
Physical Therapy Treatment Patient Details Name: Brent Warren MRN: [id number] DOB: 03-09-1960 Today's Date: 11/21/2021   History of Present Illness 61 yo male presenting 11/11 with R-sided weakness, facial droop and slurred speech. 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 PMH includes: HTN, COPD, and alcohol abuse.    PT Comments    Pt more lethargic than yesterday, suspect due to ativan pt received over night. Pt was transferred to chair today without resistance however pt also didn't actively participate. Transferred completed with hopes to stimulate pt and increase level of alertness. HR did increase to 148bpm but lowered to 120s once sitting. Acute PT to cont to follow.    Recommendations for follow up therapy are one component of a multi-disciplinary discharge planning process, led by the attending physician.  Recommendations may be updated based on patient status, additional functional criteria and insurance authorization.  Follow Up Recommendations  Acute inpatient rehab (3hours/day)     Assistance Recommended at Discharge Frequent or constant Supervision/Assistance  Patient can return home with the following A lot of help with walking and/or transfers;A lot of help with bathing/dressing/bathroom;Assistance with cooking/housework;Direct supervision/assist for medications management;Direct supervision/assist for financial management;Assist for transportation;Help with stairs or ramp for entrance   Equipment Recommendations   (defer to post acute)    Recommendations for Other Services Rehab consult     Precautions / Restrictions Precautions Precautions: Fall Precaution Comments: R hemi, SBP < 160, pt going through active withdrawls Restrictions Weight Bearing Restrictions: No     Mobility  Bed Mobility Overal bed mobility: Needs Assistance Bed Mobility: Supine to Sit     Supine to sit: +2 for physical assistance, Max assist     General bed mobility comments: HOB  elevated, helicopter transfer completed, pt with not active participation despite max verbal and tactile cues    Transfers Overall transfer level: Needs assistance Equipment used: 2 person hand held assist (face to face transfer with gait belt) Transfers: Sit to/from Stand, Bed to chair/wheelchair/BSC Sit to Stand: +2 physical assistance, Max assist Stand pivot transfers: Max assist, +2 physical assistance         General transfer comment: pt maintained flexed position at trunk, hips and knees despite tactile cues to promote hip extension. attempted x2, pt pvted to chair in effort to stimulate patient and improve alertness    Ambulation/Gait                   Stairs             Wheelchair Mobility    Modified Rankin (Stroke Patients Only) Modified Rankin (Stroke Patients Only) Pre-Morbid Rankin Score: No symptoms Modified Rankin: Moderately severe disability     Balance Overall balance assessment: Needs assistance Sitting-balance support: Single extremity supported, Feet supported Sitting balance-Leahy Scale: Poor Sitting balance - Comments: falling posteriorly, minA to minG Postural control: Posterior lean Standing balance support: Single extremity supported, During functional activity, Reliant on assistive device for balance Standing balance-Leahy Scale: Zero Standing balance comment: LOB in all planes, modA through HHA to maintain upright                            Cognition Arousal/Alertness: Lethargic Behavior During Therapy: Flat affect Overall Cognitive Status: Impaired/Different from baseline Area of Impairment: Following commands, Orientation, Attention, Problem solving, Awareness, Safety/judgement                 Orientation Level:  (can answer yes  no to name and birthdate) Current Attention Level:  (unable to focus due to lethargy)   Following Commands: Follows one step commands with increased time (limited due to  lethargy) Safety/Judgement: Decreased awareness of safety, Decreased awareness of deficits     General Comments: no family present, RN reports pt being given ativan last night and has been lethargic since, pt easily awoken however unable to maintain eyes open and engagement with therapist        Exercises      General Comments General comments (skin integrity, edema, etc.): HR max of 145 bpm, BP stable      Pertinent Vitals/Pain Pain Assessment Pain Assessment: Faces Faces Pain Scale: No hurt    Home Living                          Prior Function            PT Goals (current goals can now be found in the care plan section) Acute Rehab PT Goals Patient Stated Goal: return home PT Goal Formulation: With patient Time For Goal Achievement: 12/03/21 Potential to Achieve Goals: Good Progress towards PT goals: Not progressing toward goals - comment    Frequency    Min 3X/week (increase when pt more alert)      PT Plan Current plan remains appropriate;Frequency needs to be updated    Co-evaluation              AM-PAC PT "6 Clicks" Mobility   Outcome Measure  Help needed turning from your back to your side while in a flat bed without using bedrails?: A Lot Help needed moving from lying on your back to sitting on the side of a flat bed without using bedrails?: A Lot Help needed moving to and from a bed to a chair (including a wheelchair)?: A Lot Help needed standing up from a chair using your arms (e.g., wheelchair or bedside chair)?: A Lot Help needed to walk in hospital room?: Total Help needed climbing 3-5 steps with a railing? : Total 6 Click Score: 10    End of Session Equipment Utilized During Treatment: Gait belt Activity Tolerance: Patient tolerated treatment well Patient left: with call bell/phone within reach;with restraints reapplied;with chair alarm set;in chair (posey belt placed on pt in chair) Nurse Communication: Mobility status PT  Visit Diagnosis: Other abnormalities of gait and mobility (R26.89);Muscle weakness (generalized) (M62.81);Hemiplegia and hemiparesis Hemiplegia - Right/Left: Right Hemiplegia - dominant/non-dominant: Dominant Hemiplegia - caused by: Nontraumatic intracerebral hemorrhage     Time: 1205-1219 PT Time Calculation (min) (ACUTE ONLY): 14 min  Charges:  $Therapeutic Activity: 8-22 mins                     Lewis Shock, PT, DPT Acute Rehabilitation Services Secure chat preferred Office #: [phone]    Iona Hansen 11/21/2021, 2:06 PM

## 2021-11-21 NOTE — Progress Notes (Signed)
SLP Cancellation Note  Patient Details Name: Garry Nicolini MRN: [id number] DOB: Dec 23, 1960   Cancelled treatment:       Reason Eval/Treat Not Completed: Fatigue/lethargy limiting ability to participate. No improvement in arousal today for PO intake. Will f/u   Gema Ringold, Riley Nearing 11/21/2021, 11:54 AM

## 2021-11-21 NOTE — Progress Notes (Signed)
PROGRESS NOTE  Brent Warren KXF:[id number] DOB: 10/28/1960 DOA: 11/18/2021 PCP: Marcine Matar, MD   LOS: 3 days   Brief Narrative / Interim history: 61 year old male with history of HTN, tobacco use, EtOH use who comes into the hospital with sudden onset slurred speech and right-sided weakness.  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  He was admitted to the neuro ICU, remained stable and transferred to the hospitalist service on 11/13.  He started to develop increased withdrawal symptoms on 11/13  Significant events: 11/11-admission to the hospital 11/13-transfer to hospitalist service  Significant imaging / results / micro data: 11/11, CT head-acute left basal ganglia hemorrhage with mild surrounding edema 11/11, MRI brain-sooner signs of acute hemorrhage in the left basal ganglia without acute infarct or mass lesions 11/13, CT head-pending  Subjective / 24h Interval events: Sleeping soundly.  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  Neurology consulted and following patient.  He underwent stroke work-up, 2D echo shows EF 60-65%.  LDL is 62, A1c is 5.1. -PT/OT recommends CIR -Appreciate neurology follow-up  Active problems Alcohol abuse, alcohol withdrawal with delirium tremens-patient with significant alcohol withdrawal symptoms, PCCM consulted yesterday, appreciate input.  Currently on phenobarbital protocol -Vitamin B1 still pending this morning -Monitor, remains at risk for withdrawal seizures  Elevated LFTs-due to alcohol abuse.  Improving  Essential hypertension-continue to monitor  Tobacco use, history of emphysema-resume home albuterol  PRN  Hyponatremia-mild, overall stable  B12 deficiency, elevated MCV-folic acid was normal, but I96 was found to be low.  Replete IM  Scheduled Meds:  amLODipine  5 mg Oral Daily   Chlorhexidine Gluconate Cloth  6 each Topical Daily   cyanocobalamin  1,000 mcg Intramuscular Daily   folic acid  1 mg Oral Daily   heparin injection (subcutaneous)  5,000 Units Subcutaneous Q8H   multivitamin with minerals  1 tablet Oral Daily   pantoprazole  40 mg Oral Daily   PHENObarbital  97.5 mg Intravenous Q8H   Followed by   Melene Muller ON 11/22/2021] PHENObarbital  65 mg Intravenous Q8H   Followed by   Melene Muller ON 11/24/2021] PHENObarbital  32.5 mg Intravenous Q8H   senna-docusate  1 tablet Oral BID   sodium chloride flush  3 mL Intravenous Once   thiamine  100 mg Oral Daily   Or   thiamine  100 mg Intravenous Daily   Continuous Infusions: PRN Meds:.acetaminophen **OR** acetaminophen (TYLENOL) oral liquid 160 mg/5 mL **OR** acetaminophen, ipratropium-albuterol, labetalol, LORazepam, LORazepam **OR** LORazepam  Current Outpatient Medications  Medication Instructions   albuterol (PROVENTIL) 2.5 mg, Nebulization, Every 6 hours PRN   albuterol (VENTOLIN HFA) 108 (90 Base) MCG/ACT inhaler 2 puffs, Inhalation, Every 6 hours PRN   amLODipine (NORVASC) 10 mg, Oral, Daily   budesonide-formoterol (SYMBICORT) 80-4.5 MCG/ACT inhaler Inhale 2 puffs into the lungs 2 (two) times daily   loperamide (IMODIUM A-D) 2 mg, Oral, 2 times daily PRN   meloxicam (MOBIC) 15 mg, Oral, Daily    Diet Orders (From admission, onward)     Start     Ordered   11/20/21 1025  Diet NPO time specified  Diet effective now  11/20/21 1024            DVT prophylaxis: heparin injection 5,000 Units Start: 11/19/21 1400 SCD's Start: 11/18/21 1137   Lab Results  Component Value Date   PLT 185 11/21/2021      Code Status: Full Code  Family Communication: no family at bedside  Status is: Inpatient  Remains  inpatient appropriate because: severity of illness  Level of care: Telemetry Medical  Consultants:  Neurology  Objective: Vitals:   11/21/21 0600 11/21/21 0700 11/21/21 0800 11/21/21 0900  BP: 105/80 95/73 122/86 108/81  Pulse: 85 89 80 76  Resp: 20 (!) 26 18 17   Temp:   (!) 97 F (36.1 C)   TempSrc:   Axillary   SpO2: 97% 95% 96% 96%  Weight:        Intake/Output Summary (Last 24 hours) at 11/21/2021 0940 Last data filed at 11/21/2021 0600 Gross per 24 hour  Intake 100 ml  Output 1030 ml  Net -930 ml    Wt Readings from Last 3 Encounters:  11/18/21 59.2 kg  07/06/21 58.5 kg  02/10/21 61.7 kg    Examination:  Constitutional: NAD, sleeping ENMT: mmm Neck: normal, supple Respiratory: clear to auscultation bilaterally, no wheezing, no crackles. Normal respiratory effort.  Cardiovascular: Regular rate and rhythm, no murmurs / rubs / gallops. No LE edema. Abdomen: soft, no distention, no tenderness. Bowel sounds positive.  Skin: no rashes  Data Reviewed: I have independently reviewed following labs and imaging studies   CBC Recent Labs  Lab 11/18/21 1130 11/18/21 1134 11/19/21 0231 11/21/21 0403  WBC 7.5  --  5.7 7.9  HGB 13.6 15.0 13.5 15.3  HCT 38.8* 44.0 38.1* 45.7  PLT 224  --  214 185  MCV 104.6*  --  102.1* 106.0*  MCH 36.7*  --  36.2* 35.5*  MCHC 35.1  --  35.4 33.5  RDW 13.2  --  13.2 12.9  LYMPHSABS 2.3  --   --   --   MONOABS 0.7  --   --   --   EOSABS 0.2  --   --   --   BASOSABS 0.1  --   --   --      Recent Labs  Lab 11/18/21 1130 11/18/21 1134 11/19/21 0231 11/21/21 0403  NA 132* 131* 134* 134*  K 4.2 4.3 4.3 4.0  CL 96* 97* 99 101  CO2 21*  --  21* 20*  GLUCOSE 93 88 99 95  BUN <5* 3* 7* 8  CREATININE 0.59* 0.80 0.65 0.69  CALCIUM 9.1  --  9.3 9.8  AST 76*  --  59* 56*  ALT 38  --  34 31  ALKPHOS 58  --  53 61  BILITOT 0.5  --  0.7 0.6  ALBUMIN 4.0  --  3.8 4.0  MG  --   --  1.7 1.8  INR 1.0  --  1.0  --   HGBA1C  --    --  5.1  --      ------------------------------------------------------------------------------------------------------------------ Recent Labs    11/19/21 0231  CHOL 160  HDL 88  LDLCALC 62  TRIG 48  CHOLHDL 1.8     Lab Results  Component Value Date   HGBA1C 5.1 11/19/2021   ------------------------------------------------------------------------------------------------------------------ No results for input(s): "TSH", "T4TOTAL", "T3FREE", "THYROIDAB" in the last 72 hours.  Invalid input(s): "FREET3"  Cardiac Enzymes No results for input(s): "CKMB", "TROPONINI", "MYOGLOBIN" in the last 168 hours.  Invalid  input(s): "CK" ------------------------------------------------------------------------------------------------------------------ No results found for: "BNP"  CBG: Recent Labs  Lab 11/18/21 1129  GLUCAP 95     Recent Results (from the past 240 hour(s))  MRSA Next Gen by PCR, Nasal     Status: None   Collection Time: 11/18/21  1:13 PM   Specimen: Nasal Mucosa; Nasal Swab  Result Value Ref Range Status   MRSA by PCR Next Gen NOT DETECTED NOT DETECTED Final    Comment: (NOTE) The GeneXpert MRSA Assay (FDA approved for NASAL specimens only), is one component of a comprehensive MRSA colonization surveillance program. It is not intended to diagnose MRSA infection nor to guide or monitor treatment for MRSA infections. Test performance is not FDA approved in patients less than 47 years old. Performed at [facility], [address] [place], [place] [address]      Radiology Studies: CT HEAD WO CONTRAST ( )  Result Date: 11/20/2021 CLINICAL DATA:  Altered mental status, intraparenchymal hematoma. EXAM: CT HEAD WITHOUT CONTRAST TECHNIQUE: Contiguous axial images were obtained from the base of the skull through the vertex without intravenous contrast. RADIATION DOSE REDUCTION: This exam was performed according to the departmental dose-optimization program  which includes automated exposure control, adjustment of the mA and/or kV according to patient size and/or use of iterative reconstruction technique. COMPARISON:  CT head 2 days prior. FINDINGS: Brain: The 2.2 cm x 1.7 cm x 1.7 cm intraparenchymal hematoma centered in the left basal ganglia is unchanged in size and appearance. There is mild surrounding edema without mass effect or midline shift. There is no new acute intracranial hemorrhage or extra-axial fluid collection. There is no acute territorial infarct. Parenchymal volume is stable. The ventricles are stable in size. There is no mass lesion. Vascular: No hyperdense vessel or unexpected calcification. Skull: Normal. Negative for fracture or focal lesion. Sinuses/Orbits: The imaged paranasal sinuses are clear. The globes and orbits are unremarkable. Other: None. IMPRESSION: Unchanged intraparenchymal hematoma centered in the left basal ganglia. No new acute intracranial pathology. Electronically Signed   By: Lesia Hausen M.D.   On: 11/20/2021 15:31     Pamella Pert, MD, PhD [facility]  Between 7 am - 7 pm I am available, please contact me via Amion (for emergencies) or Securechat (non urgent messages)  Between 7 pm - 7 am I am not available, please contact night coverage MD/APP via Amion

## 2021-11-21 NOTE — Progress Notes (Signed)
STROKE TEAM PROGRESS NOTE   SUBJECTIVE (INTERVAL HISTORY) RN is at the bedside.  Pt overnight still has alcohol withdraw symptoms received standing ativan. Last dose 5am. Now ativan per CIWA protocol. Pt perked up during rounding, able to say his age "61" but severe dysarthria with intangible words. Right sided weakness weaker than 2 days ago but similar to yesterday. CT showed hematoma stable.   OBJECTIVE Temp:  [96.8 F (36 C)-98.8 F (37.1 C)] 97.9 F (36.6 C) (11/14 2000) Pulse Rate:  [63-122] 115 (11/14 1900) Resp:  [17-26] 17 (11/14 1900) BP: (84-161)/(73-116) 134/94 (11/14 1900) SpO2:  [89 %-97 %] 93 % (11/14 1900)  Recent Labs  Lab 11/18/21 1129  GLUCAP 95   Recent Labs  Lab 11/18/21 1130 11/18/21 1134 11/19/21 0231 11/21/21 0403  NA 132* 131* 134* 134*  K 4.2 4.3 4.3 4.0  CL 96* 97* 99 101  CO2 21*  --  21* 20*  GLUCOSE 93 88 99 95  BUN <5* 3* 7* 8  CREATININE 0.59* 0.80 0.65 0.69  CALCIUM 9.1  --  9.3 9.8  MG  --   --  1.7 1.8   Recent Labs  Lab 11/18/21 1130 11/19/21 0231 11/21/21 0403  AST 76* 59* 56*  ALT 38 34 31  ALKPHOS 58 53 61  BILITOT 0.5 0.7 0.6  PROT 8.0 7.7 8.3*  ALBUMIN 4.0 3.8 4.0   Recent Labs  Lab 11/18/21 1130 11/18/21 1134 11/19/21 0231 11/21/21 0403  WBC 7.5  --  5.7 7.9  NEUTROABS 4.3  --   --   --   HGB 13.6 15.0 13.5 15.3  HCT 38.8* 44.0 38.1* 45.7  MCV 104.6*  --  102.1* 106.0*  PLT 224  --  214 185   No results for input(s): "CKTOTAL", "CKMB", "CKMBINDEX", "TROPONINI" in the last 168 hours. Recent Labs    11/19/21 0231  LABPROT 13.1  INR 1.0   No results for input(s): "COLORURINE", "LABSPEC", "PHURINE", "GLUCOSEU", "HGBUR", "BILIRUBINUR", "KETONESUR", "PROTEINUR", "UROBILINOGEN", "NITRITE", "LEUKOCYTESUR" in the last 72 hours.  Invalid input(s): "APPERANCEUR"     Component Value Date/Time   CHOL 160 11/19/2021 0231   CHOL 153 10/10/2020 1650   TRIG 48 11/19/2021 0231   HDL 88 11/19/2021 0231   HDL 75  10/10/2020 1650   CHOLHDL 1.8 11/19/2021 0231   VLDL 10 11/19/2021 0231   LDLCALC 62 11/19/2021 0231   LDLCALC 62 10/10/2020 1650   Lab Results  Component Value Date   HGBA1C 5.1 11/19/2021      Component Value Date/Time   LABOPIA NONE DETECTED 08/04/2013 2132   COCAINSCRNUR NONE DETECTED 08/04/2013 2132   LABBENZ NONE DETECTED 08/04/2013 2132   AMPHETMU NONE DETECTED 08/04/2013 2132   THCU NONE DETECTED 08/04/2013 2132   LABBARB NONE DETECTED 08/04/2013 2132    Recent Labs  Lab 11/18/21 1130  ETH 201*    I have personally reviewed the radiological images below and agree with the radiology interpretations.  CT HEAD WO CONTRAST ( )  Result Date: 11/20/2021 CLINICAL DATA:  Altered mental status, intraparenchymal hematoma. EXAM: CT HEAD WITHOUT CONTRAST TECHNIQUE: Contiguous axial images were obtained from the base of the skull through the vertex without intravenous contrast. RADIATION DOSE REDUCTION: This exam was performed according to the departmental dose-optimization program which includes automated exposure control, adjustment of the mA and/or kV according to patient size and/or use of iterative reconstruction technique. COMPARISON:  CT head 2 days prior. FINDINGS: Brain: The 2.2 cm x 1.7 cm x  1.7 cm intraparenchymal hematoma centered in the left basal ganglia is unchanged in size and appearance. There is mild surrounding edema without mass effect or midline shift. There is no new acute intracranial hemorrhage or extra-axial fluid collection. There is no acute territorial infarct. Parenchymal volume is stable. The ventricles are stable in size. There is no mass lesion. Vascular: No hyperdense vessel or unexpected calcification. Skull: Normal. Negative for fracture or focal lesion. Sinuses/Orbits: The imaged paranasal sinuses are clear. The globes and orbits are unremarkable. Other: None. IMPRESSION: Unchanged intraparenchymal hematoma centered in the left basal ganglia. No new  acute intracranial pathology. Electronically Signed   By: Valetta Mole M.D.   On: 11/20/2021 15:31   ECHOCARDIOGRAM COMPLETE  Result Date: 11/19/2021    ECHOCARDIOGRAM REPORT   Patient Name:   Brent Warren Date of Exam: 11/19/2021 Medical Rec #:  [id number]   Height:       69.0 in Accession #:    [id number]  Weight:       130.5 lb Date of Birth:  January 08, 1961    BSA:          1.723 m Patient Age:    61 years    BP:           116/100 mmHg Patient Gender: M           HR:           66 bpm. Exam Location:  Inpatient Procedure: 2D Echo, Cardiac Doppler, Color Doppler and Intracardiac            Opacification Agent Indications:    Stroke  History:        Patient has prior history of Echocardiogram examinations, most                 recent 05/02/2017. Risk Factors:Hypertension and Current Smoker.                 ETOH abuse.  Sonographer:    Clayton Lefort RDCS (AE) Referring Phys: [facility]  Sonographer Comments: Technically difficult study due to poor echo windows, suboptimal parasternal window, suboptimal apical window and suboptimal subcostal window. IMPRESSIONS  1. Left ventricular ejection fraction, by estimation, is 60 to 65%. The left ventricle has normal function. The left ventricle has no regional wall motion abnormalities. Left ventricular diastolic parameters are consistent with Grade I diastolic dysfunction (impaired relaxation).  2. Right ventricular systolic function is normal. The right ventricular size is normal. Tricuspid regurgitation signal is inadequate for assessing PA pressure.  3. The mitral valve is grossly normal. No evidence of mitral valve regurgitation.  4. The aortic valve was not well visualized. Aortic valve regurgitation is not visualized.  5. The inferior vena cava is normal in size with greater than 50% respiratory variability, suggesting right atrial pressure of 3 mmHg. Comparison(s): Changes from prior study are noted. 05/02/2017 (stress echo) - negative for ischemia, LVEF 50-55% at  baseline. FINDINGS  Left Ventricle: Left ventricular ejection fraction, by estimation, is 60 to 65%. The left ventricle has normal function. The left ventricle has no regional wall motion abnormalities. Definity contrast agent was given IV to delineate the left ventricular  endocardial borders. The left ventricular internal cavity size was normal in size. There is no left ventricular hypertrophy. Left ventricular diastolic parameters are consistent with Grade I diastolic dysfunction (impaired relaxation). Indeterminate filling pressures. Right Ventricle: The right ventricular size is normal. No increase in right ventricular wall thickness. Right ventricular systolic function is normal. Tricuspid  regurgitation signal is inadequate for assessing PA pressure. Left Atrium: Left atrial size was normal in size. Right Atrium: Right atrial size was normal in size. Pericardium: There is no evidence of pericardial effusion. Mitral Valve: The mitral valve is grossly normal. No evidence of mitral valve regurgitation. Tricuspid Valve: The tricuspid valve is not well visualized. Tricuspid valve regurgitation is not demonstrated. Aortic Valve: The aortic valve was not well visualized. Aortic valve regurgitation is not visualized. Aortic valve mean gradient measures 2.0 mmHg. Aortic valve peak gradient measures 4.4 mmHg. Pulmonic Valve: The pulmonic valve was not well visualized. Pulmonic valve regurgitation is not visualized. Aorta: The aortic root and ascending aorta are structurally normal, with no evidence of dilitation. Venous: The inferior vena cava is normal in size with greater than 50% respiratory variability, suggesting right atrial pressure of 3 mmHg. IAS/Shunts: The interatrial septum appears to be lipomatous. No atrial level shunt detected by color flow Doppler.   Diastology LV e' medial:    4.35 cm/s LV E/e' medial:  12.6 LV e' lateral:   7.51 cm/s LV E/e' lateral: 7.3  RIGHT VENTRICLE             IVC RV Basal diam:   3.30 cm     IVC diam: 1.40 cm RV S prime:     13.50 cm/s TAPSE (M-mode): 1.5 cm LEFT ATRIUM           Index       RIGHT ATRIUM           Index LA Vol (A4C): 15.8 ml 9.17 ml/m  RA Area:     13.20 cm                                   RA Volume:   34.30 ml  19.90 ml/m  AORTIC VALVE AV Vmax:           105.00 cm/s AV Vmean:          69.800 cm/s AV VTI:            0.198 m AV Peak Grad:      4.4 mmHg AV Mean Grad:      2.0 mmHg LVOT Vmax:         92.50 cm/s LVOT Vmean:        56.200 cm/s LVOT VTI:          0.157 m LVOT/AV VTI ratio: 0.79 MITRAL VALVE MV Area (PHT): 2.99 cm    SHUNTS MV Decel Time: 254 msec    Systemic VTI: 0.16 m MV E velocity: 54.80 cm/s MV A velocity: 74.80 cm/s MV E/A ratio:  0.73 Zoila Shutter MD Electronically signed by Zoila Shutter MD Signature Date/Time: 11/19/2021/2:04:56 PM    Final    CT HEAD WO CONTRAST  Result Date: 11/18/2021 CLINICAL DATA:  Hemorrhagic stroke. EXAM: CT HEAD WITHOUT CONTRAST TECHNIQUE: Contiguous axial images were obtained from the base of the skull through the vertex without intravenous contrast. RADIATION DOSE REDUCTION: This exam was performed according to the departmental dose-optimization program which includes automated exposure control, adjustment of the mA and/or kV according to patient size and/or use of iterative reconstruction technique. COMPARISON:  CT angiography dated 11/18/2021. FINDINGS: Brain: No significant interval change in the left basal ganglia hemorrhage. No new hemorrhage. There is mild age-related atrophy and chronic microvascular ischemic changes. No mass effect or midline shift. No extra-axial fluid collection. Vascular: No hyperdense  vessel or unexpected calcification. Skull: Normal. Negative for fracture or focal lesion. Sinuses/Orbits: The visualized paranasal sinuses and mastoid air cells are clear. There is deviation of the nose to the left. Cerumen noted in the external auditory canals bilaterally. Other: None IMPRESSION: 1. No  significant interval change in the left basal ganglia hemorrhage. No new hemorrhage. 2. Mild age-related atrophy and chronic microvascular ischemic changes. Electronically Signed   By: Anner Crete M.D.   On: 11/18/2021 19:34   MR BRAIN W WO CONTRAST  Result Date: 11/18/2021 CLINICAL DATA:  Neuro deficit, acute, stroke suspected EXAM: MRI HEAD WITHOUT AND WITH CONTRAST TECHNIQUE: Multiplanar, multiecho pulse sequences of the brain and surrounding structures were obtained without and with intravenous contrast. CONTRAST:  27mL GADAVIST GADOBUTROL 1 MMOL/ML IV SOLN COMPARISON:  CT head from the same day. FINDINGS: Brain: When comparing across modalities, similar size of acute hemorrhage in the left basal ganglia. Mild surrounding edema. No significant mass effect. No midline shift. No surrounding restricted diffusion to suggest acute fracture. No visible mass lesion; however, acute blood products limit assessment. Patchy T2/FLAIR hyperintensity in the white matter, nonspecific but compatible with chronic microvascular disease. Cerebral atrophy. No hydrocephalus. No pathologic enhancement. Vascular: Major arterial flow voids are maintained skull base. Skull and upper cervical spine: Normal marrow signal. Sinuses/Orbits: Clear sinuses.  No acute orbital findings. Other: No mastoid effusions. IMPRESSION: When comparing across modalities, similar size of acute hemorrhage in the left basal ganglia. No visible acute infarct or mass lesion; however, acute blood products limits assessment. Electronically Signed   By: Margaretha Sheffield M.D.   On: 11/18/2021 14:50   CT ANGIO HEAD NECK W WO CM (CODE STROKE)  Result Date: 11/18/2021 CLINICAL DATA:  Code stroke. 61 year male with weakness. Acute left basal ganglia hemorrhage on plain CT. EXAM: CT ANGIOGRAPHY HEAD AND NECK TECHNIQUE: Multidetector CT imaging of the head and neck was performed using the standard protocol during bolus administration of intravenous  contrast. Multiplanar CT image reconstructions and MIPs were obtained to evaluate the vascular anatomy. Carotid stenosis measurements (when applicable) are obtained utilizing NASCET criteria, using the distal internal carotid diameter as the denominator. RADIATION DOSE REDUCTION: This exam was performed according to the departmental dose-optimization program which includes automated exposure control, adjustment of the mA and/or kV according to patient size and/or use of iterative reconstruction technique. CONTRAST:  4mL OMNIPAQUE IOHEXOL 350 MG/ML SOLN COMPARISON:  Plain head CT 1132 hours today. FINDINGS: CTA NECK Skeleton: Cervical spine degeneration with developing degenerative appearing upper cervical multilevel posterior element ankylosis. No acute or suspicious osseous lesion identified. Upper chest: Centrilobular and paraseptal emphysema. Negative visible superior mediastinum. Other neck: Chronic nasal bone fractures. No acute finding identified. Aortic arch: 3 vessel arch configuration. Mild Calcified aortic atherosclerosis. Right carotid system: Brachiocephalic artery and proximal right CCA are negative. Minimal plaque in the distal right CCA and at the bifurcation primarily affecting the ECA. No stenosis. Left carotid system: Intermittent mild left CCA plaque before the bifurcation without stenosis. Mild calcified plaque at the bifurcation more affecting the ECA. No stenosis. Vertebral arteries: Proximal right subclavian artery and right vertebral artery origin plaque with only mild vertebral origin stenosis (series 8, image 103). Right vertebral appears somewhat non dominant but patent to the skull base without additional stenosis. Proximal left subclavian artery plaque with no significant stenosis. Mild plaque at the left vertebral artery origin with mild if any stenosis. Mildly dominant left vertebral artery is patent to the skull base with no  additional stenosis. CTA HEAD Posterior circulation:  Dominant left V4 segment especially distal to the patent right PICA origin. Normal left PICA. Mild left V4 calcified plaque. No significant distal vertebral or vertebrobasilar junction stenosis. Patent basilar artery without stenosis. Patent SCA and PCA origins. Right PCA branches are within normal limits. Left PCA mild P2 segment irregularity with no significant stenosis. Anterior circulation: Both ICA siphons are patent. Mild, mostly supraclinoid calcified plaque bilaterally. Mild supraclinoid ICA stenosis more pronounced on the right. Patent carotid termini. Patent MCA and ACA origins. Anterior communicating artery and ACA branches are within normal limits. The left A2 appears to be dominant. Right MCA M1 segment and bifurcation are patent without stenosis. Right MCA branches are within normal limits. No left hemisphere CTA spot sign. Left MCA M1 segment is tortuous without stenosis. Patent left MCA bifurcation without stenosis. Left MCA branches are patent with some mild M2 and M3 irregularity in the middle sylvian division. Venous sinuses: Early contrast timing, not well evaluated. Anatomic variants: Dominant left vertebral artery. Dominant left ACA A2. Review of the MIP images confirms the above findings IMPRESSION: 1. No CTA spot sign in association with left basal ganglia hemorrhage. Negative for large vessel occlusion. 2. Generally mild atherosclerosis in the head and neck. No hemodynamically significant stenosis. 3. Cervical spine degeneration. Aortic Atherosclerosis (ICD10-I70.0) and Emphysema (ICD10-J43.9). Salient findings were communicated to Dr. Lorrin Goodell at 12:05 pm on 11/18/2021 by text page via the [facility] messaging system. Electronically Signed   By: Genevie Ann M.D.   On: 11/18/2021 12:05   CT HEAD CODE STROKE WO CONTRAST  Result Date: 11/18/2021 CLINICAL DATA:  Code stroke.  61 year male with weakness. EXAM: CT HEAD WITHOUT CONTRAST TECHNIQUE: Contiguous axial images were obtained from the base  of the skull through the vertex without intravenous contrast. RADIATION DOSE REDUCTION: This exam was performed according to the departmental dose-optimization program which includes automated exposure control, adjustment of the mA and/or kV according to patient size and/or use of iterative reconstruction technique. COMPARISON:  Head CT 08/24/2010. FINDINGS: Brain: Globular hyperdense hemorrhage at the left basal ganglia, centered at the lentiform encompasses 19 x 22 x 21 mm (AP by transverse by CC), estimated volume 4 mL. Mild surrounding edema. No significant mass effect. No intraventricular or extra-axial extension of blood. Generalized cerebral volume loss since 2012 seems advanced for age. No disproportionate ventriculomegaly. No midline shift. Patchy additional bilateral cerebral white matter hypodensity. No superimposed cortically based acute infarct identified. Vascular: Calcified atherosclerosis at the skull base. No suspicious intracranial vascular hyperdensity. Skull: No acute osseous abnormality identified. Sinuses/Orbits: Visualized paranasal sinuses and mastoids are clear. Other: Visualized orbits and scalp soft tissues are within normal limits. ASPECTS [facility] Stroke Program Early CT Score) Total score (0-10 with 10 being normal): Not applicable, acute hemorrhage Study discussed by telephone with Dr. Lorrin Goodell on 11/18/2021 at 11:46 . IMPRESSION: 1. 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 Mild surrounding edema. No significant mass effect. This was briefly discussed by telephone with Dr. Lorrin Goodell on 11/18/2021 at 11:46. 2. Evidence of underlying chronic small vessel disease. Generalized brain volume loss since 2012 seems advanced for age. 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 Electronically Signed   By: Genevie Ann M.D.   On: 11/18/2021 11:49     PHYSICAL EXAM  Temp:  [96.8 F (36 C)-98.8 F (37.1 C)] 97.9 F (36.6 C) (11/14 2000) Pulse Rate:  [63-122] 115 (11/14 1900) Resp:   [17-26] 17 (11/14 1900) BP: (84-161)/(73-116) 134/94 (11/14 1900) SpO2:  [  89 %-97 %] 93 % (11/14 1900)  General - thin built, well developed, initially sedated but later perked up.  Ophthalmologic - fundi not visualized due to noncooperation.  Cardiovascular - Regular rhythm and rate.  Mental Status -  Severe dysarthria, with intangible words, stated his age wrong but able to follow simple commands on the left hand and foot.  Cranial Nerves II - XII - II - Visual field intact OU. III, IV, VI - Extraocular movements intact. V - Facial sensation decreased on the right. VII - Facial movement showed right facial droop. VIII - Hearing & vestibular intact bilaterally. X - severe dysarthria XI - Chin turning & shoulder shrug intact bilaterally. XII - Tongue protrusion intact.  Motor Strength - The patient's strength was spontaneous movement against gravity at LUE and LLE and RUE deltoid 0/5, bicep 1/5, tricep 1/5. RLE proximal and distal 1/5, mild withdraw to pain stimulation.  Bulk was normal and fasciculations were absent.   Motor Tone - Muscle tone was assessed at the neck and appendages and was normal.  Reflexes - The patient's reflexes were symmetrical in all extremities and he had no pathological reflexes.  Sensory - Light touch, temperature/pinprick were not cooperative on exma  Coordination - not cooperative on exam.  Tremor was absent.  Gait and Station - deferred.   ASSESSMENT/PLAN [name] is a 61 y.o. male with history of hypertension, smoker, heavy alcoholic admitted for right-sided numbness and weakness, right facial droop, slurred speech. No tPA given due to [place].    ICH:  left BG ICH, likely due to small vessel disease given multiple uncontrolled risk factors CT showed left BG small ICH CTA head and neck unremarkable, no aneurysm or AVM MRI left BG small ICH CT repeat stable hematoma x 2 2D Echo EF 60 to 65% LDL 62 HgbA1c 5.1 UDS pending Heparin subcu for  VTE prophylaxis No antithrombotic prior to admission, now on No antithrombotic given ICH Ongoing aggressive stroke risk factor management Therapy recommendations: CIR Disposition: Pending  Hypertension Stable on the high end Labetalol IV PRN Consider po BP meds once po access Long term BP goal normotensive  Tobacco abuse Current smoker Smoking cessation counseling provided Pt is willing to quit  Alcohol withdraw Heavy alcohol user, 6-10 beers every day On CIWA protocol Thiamine/folic acid/multivitamin Limitation of alcohol use education provided On phenobarbital taper  B12 deficiency B12 = 177 MCV and MCH elevated On B12 supplement  Dysphagia  Did not pass swallow Now NPO On IVF Consider cortrak in am to start TF  Other Stroke Risk Factors   Other [facility] day # 3   Rosalin Hawking, MD PhD Stroke Neurology 11/21/2021 9:29 PM    To contact Stroke Continuity provider, please refer to [email or web address] After hours, contact General Neurology

## 2021-11-22 ENCOUNTER — Inpatient Hospital Stay (HOSPITAL_COMMUNITY): Payer: Medicaid Other

## 2021-11-22 DIAGNOSIS — I10 Essential (primary) hypertension: Secondary | ICD-10-CM | POA: Diagnosis not present

## 2021-11-22 DIAGNOSIS — F1010 Alcohol abuse, uncomplicated: Secondary | ICD-10-CM | POA: Diagnosis not present

## 2021-11-22 DIAGNOSIS — I619 Nontraumatic intracerebral hemorrhage, unspecified: Secondary | ICD-10-CM

## 2021-11-22 DIAGNOSIS — R1310 Dysphagia, unspecified: Secondary | ICD-10-CM | POA: Diagnosis not present

## 2021-11-22 DIAGNOSIS — F17200 Nicotine dependence, unspecified, uncomplicated: Secondary | ICD-10-CM | POA: Diagnosis not present

## 2021-11-22 DIAGNOSIS — E43 Unspecified severe protein-calorie malnutrition: Secondary | ICD-10-CM | POA: Insufficient documentation

## 2021-11-22 DIAGNOSIS — R4182 Altered mental status, unspecified: Secondary | ICD-10-CM

## 2021-11-22 DIAGNOSIS — F10931 Alcohol use, unspecified with withdrawal delirium: Secondary | ICD-10-CM | POA: Diagnosis not present

## 2021-11-22 DIAGNOSIS — Z4682 Encounter for fitting and adjustment of non-vascular catheter: Secondary | ICD-10-CM | POA: Diagnosis not present

## 2021-11-22 DIAGNOSIS — I610 Nontraumatic intracerebral hemorrhage in hemisphere, subcortical: Secondary | ICD-10-CM | POA: Diagnosis not present

## 2021-11-22 LAB — RAPID URINE DRUG SCREEN, HOSP PERFORMED
Amphetamines: NOT DETECTED
Barbiturates: POSITIVE — AB
Benzodiazepines: POSITIVE — AB
Cocaine: NOT DETECTED
Opiates: NOT DETECTED
Tetrahydrocannabinol: POSITIVE — AB

## 2021-11-22 LAB — GLUCOSE, CAPILLARY
Glucose-Capillary: 115 mg/dL — ABNORMAL HIGH (ref 70–99)
Glucose-Capillary: 107 mg/dL — ABNORMAL HIGH (ref 70–99)
Glucose-Capillary: 120 mg/dL — ABNORMAL HIGH (ref 70–99)

## 2021-11-22 LAB — VITAMIN B1: Vitamin B1 (Thiamine): 104.9 nmol/L (ref 66.5–200.0)

## 2021-11-22 LAB — PHOSPHORUS: Phosphorus: 2.8 mg/dL (ref 2.5–4.6)

## 2021-11-22 LAB — MAGNESIUM: Magnesium: 1.8 mg/dL (ref 1.7–2.4)

## 2021-11-22 MED ORDER — PEPTAMEN 1.5 CAL PO LIQD
1000.0000 mL | ORAL | Status: DC
  Administered 2021-11-22 – 2021-11-26 (×3): 1000 mL
  Filled 2021-11-22 (×7): qty 1000

## 2021-11-22 MED ORDER — ORAL CARE MOUTH RINSE
15.0000 mL | OROMUCOSAL | Status: DC
  Administered 2021-11-22 – 2021-12-19 (×99): 15 mL via OROMUCOSAL

## 2021-11-22 MED ORDER — SENNOSIDES-DOCUSATE SODIUM 8.6-50 MG PO TABS
1.0000 | ORAL_TABLET | Freq: Two times a day (BID) | ORAL | Status: DC
  Administered 2021-11-22 – 2021-12-03 (×21): 1
  Filled 2021-11-22 (×21): qty 1

## 2021-11-22 MED ORDER — ORAL CARE MOUTH RINSE
15.0000 mL | OROMUCOSAL | Status: DC | PRN
  Administered 2021-11-24 – 2021-11-27 (×5): 15 mL via OROMUCOSAL

## 2021-11-22 MED ORDER — AMLODIPINE BESYLATE 5 MG PO TABS
5.0000 mg | ORAL_TABLET | Freq: Every day | ORAL | Status: DC
  Administered 2021-11-22: 5 mg
  Filled 2021-11-22: qty 1

## 2021-11-22 MED ORDER — ADULT MULTIVITAMIN W/MINERALS CH
1.0000 | ORAL_TABLET | Freq: Every day | ORAL | Status: DC
  Administered 2021-11-22 – 2021-12-12 (×21): 1
  Filled 2021-11-22 (×21): qty 1

## 2021-11-22 MED ORDER — PANTOPRAZOLE SODIUM 40 MG IV SOLR
40.0000 mg | Freq: Every day | INTRAVENOUS | Status: DC
  Administered 2021-11-22 – 2021-11-30 (×9): 40 mg via INTRAVENOUS
  Filled 2021-11-22 (×9): qty 10

## 2021-11-22 MED ORDER — PROSOURCE TF20 ENFIT COMPATIBL EN LIQD
60.0000 mL | Freq: Every day | ENTERAL | Status: DC
  Administered 2021-11-22 – 2021-11-30 (×9): 60 mL
  Filled 2021-11-22 (×10): qty 60

## 2021-11-22 MED ORDER — FAMOTIDINE 20 MG PO TABS: 20.0000 mg | ORAL_TABLET | Freq: Two times a day (BID) | ORAL | Status: DC

## 2021-11-22 MED ORDER — FOLIC ACID 1 MG PO TABS
1.0000 mg | ORAL_TABLET | Freq: Every day | ORAL | Status: DC
  Administered 2021-11-23 – 2021-12-12 (×20): 1 mg
  Filled 2021-11-22 (×19): qty 1

## 2021-11-22 MED ORDER — THIAMINE MONONITRATE 100 MG PO TABS
100.0000 mg | ORAL_TABLET | Freq: Every day | ORAL | Status: DC
  Administered 2021-11-23 – 2021-12-12 (×20): 100 mg
  Filled 2021-11-22 (×19): qty 1

## 2021-11-22 MED ORDER — LORAZEPAM 2 MG/ML IJ SOLN
1.0000 mg | Freq: Once | INTRAMUSCULAR | Status: AC
  Administered 2021-11-22: 1 mg via INTRAVENOUS
  Filled 2021-11-22: qty 1

## 2021-11-22 NOTE — Progress Notes (Signed)
  Inpatient Rehabilitation Admissions Coordinator   I spoke with brother, [name], by phone. He can not provide 24/7 caregiver supports which patient will need. [name] is requesting SNF placement at [facility]. I have alerted acute team and TOC. We will sign off.  Ottie Glazier, RN, MSN Rehab Admissions Coordinator [phone] 11/22/2021 4:46 PM

## 2021-11-22 NOTE — Progress Notes (Signed)
Inpatient Rehabilitation Admissions Coordinator   Rehab consult received. I attempted to contact pt's brother's number as per chart and the Voicemail is full and I am unable to leave a message.   Ottie Glazier, RN, MSN Rehab Admissions Coordinator [phone] 11/22/2021 2:58 PM

## 2021-11-22 NOTE — Progress Notes (Addendum)
STROKE TEAM PROGRESS NOTE   SUBJECTIVE (INTERVAL HISTORY) No family is at the bedside.  Pt still very drowsy sleepy and hard to arouse but mumbles to answer orientation questions. Received two doses of ativan overnight. This morning still has tremors b/l hand, right more than left. Will do EEG also. Will put on cortrak.   OBJECTIVE Temp:  [96.8 F (36 C)-99 F (37.2 C)] 97.8 F (36.6 C) (11/15 0800) Pulse Rate:  [71-126] 85 (11/15 0900) Resp:  [16-31] 16 (11/15 0900) BP: (84-154)/(73-106) 135/82 (11/15 0900) SpO2:  [86 %-98 %] 98 % (11/15 0900)  Recent Labs  Lab 11/18/21 1129  GLUCAP 95   Recent Labs  Lab 11/18/21 1130 11/18/21 1134 11/19/21 0231 11/21/21 0403  NA 132* 131* 134* 134*  K 4.2 4.3 4.3 4.0  CL 96* 97* 99 101  CO2 21*  --  21* 20*  GLUCOSE 93 88 99 95  BUN <5* 3* 7* 8  CREATININE 0.59* 0.80 0.65 0.69  CALCIUM 9.1  --  9.3 9.8  MG  --   --  1.7 1.8   Recent Labs  Lab 11/18/21 1130 11/19/21 0231 11/21/21 0403  AST 76* 59* 56*  ALT 38 34 31  ALKPHOS 58 53 61  BILITOT 0.5 0.7 0.6  PROT 8.0 7.7 8.3*  ALBUMIN 4.0 3.8 4.0   Recent Labs  Lab 11/18/21 1130 11/18/21 1134 11/19/21 0231 11/21/21 0403  WBC 7.5  --  5.7 7.9  NEUTROABS 4.3  --   --   --   HGB 13.6 15.0 13.5 15.3  HCT 38.8* 44.0 38.1* 45.7  MCV 104.6*  --  102.1* 106.0*  PLT 224  --  214 185   No results for input(s): "CKTOTAL", "CKMB", "CKMBINDEX", "TROPONINI" in the last 168 hours. No results for input(s): "LABPROT", "INR" in the last 72 hours.  No results for input(s): "COLORURINE", "LABSPEC", "PHURINE", "GLUCOSEU", "HGBUR", "BILIRUBINUR", "KETONESUR", "PROTEINUR", "UROBILINOGEN", "NITRITE", "LEUKOCYTESUR" in the last 72 hours.  Invalid input(s): "APPERANCEUR"     Component Value Date/Time   CHOL 160 11/19/2021 0231   CHOL 153 10/10/2020 1650   TRIG 48 11/19/2021 0231   HDL 88 11/19/2021 0231   HDL 75 10/10/2020 1650   CHOLHDL 1.8 11/19/2021 0231   VLDL 10 11/19/2021 0231    LDLCALC 62 11/19/2021 0231   LDLCALC 62 10/10/2020 1650   Lab Results  Component Value Date   HGBA1C 5.1 11/19/2021      Component Value Date/Time   LABOPIA NONE DETECTED 11/22/2021 0030   COCAINSCRNUR NONE DETECTED 11/22/2021 0030   LABBENZ POSITIVE (A) 11/22/2021 0030   AMPHETMU NONE DETECTED 11/22/2021 0030   THCU POSITIVE (A) 11/22/2021 0030   LABBARB POSITIVE (A) 11/22/2021 0030    Recent Labs  Lab 11/18/21 1130  ETH 201*    I have personally reviewed the radiological images below and agree with the radiology interpretations.  DG Abd Portable 1V  Result Date: 11/22/2021 CLINICAL DATA:  NG tube placement EXAM: PORTABLE ABDOMEN - 1 VIEW COMPARISON:  None Available. FINDINGS: Enteric tube tip projects over the stomach. No focal consolidation in the partially imaged lung bases. Non dilated bowel loops in the upper abdomen. IMPRESSION: Enteric tube tip projects over the stomach. Electronically Signed   By: Agustin Cree M.D.   On: 11/22/2021 11:14   CT HEAD WO CONTRAST ( )  Result Date: 11/20/2021 CLINICAL DATA:  Altered mental status, intraparenchymal hematoma. EXAM: CT HEAD WITHOUT CONTRAST TECHNIQUE: Contiguous axial images were obtained from the  base of the skull through the vertex without intravenous contrast. RADIATION DOSE REDUCTION: This exam was performed according to the departmental dose-optimization program which includes automated exposure control, adjustment of the mA and/or kV according to patient size and/or use of iterative reconstruction technique. COMPARISON:  CT head 2 days prior. FINDINGS: Brain: The 2.2 cm x 1.7 cm x 1.7 cm intraparenchymal hematoma centered in the left basal ganglia is unchanged in size and appearance. There is mild surrounding edema without mass effect or midline shift. There is no new acute intracranial hemorrhage or extra-axial fluid collection. There is no acute territorial infarct. Parenchymal volume is stable. The ventricles are stable in  size. There is no mass lesion. Vascular: No hyperdense vessel or unexpected calcification. Skull: Normal. Negative for fracture or focal lesion. Sinuses/Orbits: The imaged paranasal sinuses are clear. The globes and orbits are unremarkable. Other: None. IMPRESSION: Unchanged intraparenchymal hematoma centered in the left basal ganglia. No new acute intracranial pathology. Electronically Signed   By: Lesia Hausen M.D.   On: 11/20/2021 15:31   ECHOCARDIOGRAM COMPLETE  Result Date: 11/19/2021    ECHOCARDIOGRAM REPORT   Patient Name:   Zeth Seevers Date of Exam: 11/19/2021 Medical Rec #:  [id number]   Height:       69.0 in Accession #:    [id number]  Weight:       130.5 lb Date of Birth:  1960/06/12    BSA:          1.723 m Patient Age:    61 years    BP:           116/100 mmHg Patient Gender: M           HR:           66 bpm. Exam Location:  Inpatient Procedure: 2D Echo, Cardiac Doppler, Color Doppler and Intracardiac            Opacification Agent Indications:    Stroke  History:        Patient has prior history of Echocardiogram examinations, most                 recent 05/02/2017. Risk Factors:Hypertension and Current Smoker.                 ETOH abuse.  Sonographer:    Ross Ludwig RDCS (AE) Referring Phys: [facility]  Sonographer Comments: Technically difficult study due to poor echo windows, suboptimal parasternal window, suboptimal apical window and suboptimal subcostal window. IMPRESSIONS  1. Left ventricular ejection fraction, by estimation, is 60 to 65%. The left ventricle has normal function. The left ventricle has no regional wall motion abnormalities. Left ventricular diastolic parameters are consistent with Grade I diastolic dysfunction (impaired relaxation).  2. Right ventricular systolic function is normal. The right ventricular size is normal. Tricuspid regurgitation signal is inadequate for assessing PA pressure.  3. The mitral valve is grossly normal. No evidence of mitral valve regurgitation.   4. The aortic valve was not well visualized. Aortic valve regurgitation is not visualized.  5. The inferior vena cava is normal in size with greater than 50% respiratory variability, suggesting right atrial pressure of 3 mmHg. Comparison(s): Changes from prior study are noted. 05/02/2017 (stress echo) - negative for ischemia, LVEF 50-55% at baseline. FINDINGS  Left Ventricle: Left ventricular ejection fraction, by estimation, is 60 to 65%. The left ventricle has normal function. The left ventricle has no regional wall motion abnormalities. Definity contrast agent was given IV to delineate  the left ventricular  endocardial borders. The left ventricular internal cavity size was normal in size. There is no left ventricular hypertrophy. Left ventricular diastolic parameters are consistent with Grade I diastolic dysfunction (impaired relaxation). Indeterminate filling pressures. Right Ventricle: The right ventricular size is normal. No increase in right ventricular wall thickness. Right ventricular systolic function is normal. Tricuspid regurgitation signal is inadequate for assessing PA pressure. Left Atrium: Left atrial size was normal in size. Right Atrium: Right atrial size was normal in size. Pericardium: There is no evidence of pericardial effusion. Mitral Valve: The mitral valve is grossly normal. No evidence of mitral valve regurgitation. Tricuspid Valve: The tricuspid valve is not well visualized. Tricuspid valve regurgitation is not demonstrated. Aortic Valve: The aortic valve was not well visualized. Aortic valve regurgitation is not visualized. Aortic valve mean gradient measures 2.0 mmHg. Aortic valve peak gradient measures 4.4 mmHg. Pulmonic Valve: The pulmonic valve was not well visualized. Pulmonic valve regurgitation is not visualized. Aorta: The aortic root and ascending aorta are structurally normal, with no evidence of dilitation. Venous: The inferior vena cava is normal in size with greater than 50%  respiratory variability, suggesting right atrial pressure of 3 mmHg. IAS/Shunts: The interatrial septum appears to be lipomatous. No atrial level shunt detected by color flow Doppler.   Diastology LV e' medial:    4.35 cm/s LV E/e' medial:  12.6 LV e' lateral:   7.51 cm/s LV E/e' lateral: 7.3  RIGHT VENTRICLE             IVC RV Basal diam:  3.30 cm     IVC diam: 1.40 cm RV S prime:     13.50 cm/s TAPSE (M-mode): 1.5 cm LEFT ATRIUM           Index       RIGHT ATRIUM           Index LA Vol (A4C): 15.8 ml 9.17 ml/m  RA Area:     13.20 cm                                   RA Volume:   34.30 ml  19.90 ml/m  AORTIC VALVE AV Vmax:           105.00 cm/s AV Vmean:          69.800 cm/s AV VTI:            0.198 m AV Peak Grad:      4.4 mmHg AV Mean Grad:      2.0 mmHg LVOT Vmax:         92.50 cm/s LVOT Vmean:        56.200 cm/s LVOT VTI:          0.157 m LVOT/AV VTI ratio: 0.79 MITRAL VALVE MV Area (PHT): 2.99 cm    SHUNTS MV Decel Time: 254 msec    Systemic VTI: 0.16 m MV E velocity: 54.80 cm/s MV A velocity: 74.80 cm/s MV E/A ratio:  0.73 Zoila Shutter MD Electronically signed by Zoila Shutter MD Signature Date/Time: 11/19/2021/2:04:56 PM    Final    CT HEAD WO CONTRAST  Result Date: 11/18/2021 CLINICAL DATA:  Hemorrhagic stroke. EXAM: CT HEAD WITHOUT CONTRAST TECHNIQUE: Contiguous axial images were obtained from the base of the skull through the vertex without intravenous contrast. RADIATION DOSE REDUCTION: This exam was performed according to the departmental dose-optimization program which includes automated exposure control,  adjustment of the mA and/or kV according to patient size and/or use of iterative reconstruction technique. COMPARISON:  CT angiography dated 11/18/2021. FINDINGS: Brain: No significant interval change in the left basal ganglia hemorrhage. No new hemorrhage. There is mild age-related atrophy and chronic microvascular ischemic changes. No mass effect or midline shift. No extra-axial fluid  collection. Vascular: No hyperdense vessel or unexpected calcification. Skull: Normal. Negative for fracture or focal lesion. Sinuses/Orbits: The visualized paranasal sinuses and mastoid air cells are clear. There is deviation of the nose to the left. Cerumen noted in the external auditory canals bilaterally. Other: None IMPRESSION: 1. No significant interval change in the left basal ganglia hemorrhage. No new hemorrhage. 2. Mild age-related atrophy and chronic microvascular ischemic changes. Electronically Signed   By: Elgie Collard M.D.   On: 11/18/2021 19:34   MR BRAIN W WO CONTRAST  Result Date: 11/18/2021 CLINICAL DATA:  Neuro deficit, acute, stroke suspected EXAM: MRI HEAD WITHOUT AND WITH CONTRAST TECHNIQUE: Multiplanar, multiecho pulse sequences of the brain and surrounding structures were obtained without and with intravenous contrast. CONTRAST:  6mL GADAVIST GADOBUTROL 1 MMOL/ML IV SOLN COMPARISON:  CT head from the same day. FINDINGS: Brain: When comparing across modalities, similar size of acute hemorrhage in the left basal ganglia. Mild surrounding edema. No significant mass effect. No midline shift. No surrounding restricted diffusion to suggest acute fracture. No visible mass lesion; however, acute blood products limit assessment. Patchy T2/FLAIR hyperintensity in the white matter, nonspecific but compatible with chronic microvascular disease. Cerebral atrophy. No hydrocephalus. No pathologic enhancement. Vascular: Major arterial flow voids are maintained skull base. Skull and upper cervical spine: Normal marrow signal. Sinuses/Orbits: Clear sinuses.  No acute orbital findings. Other: No mastoid effusions. IMPRESSION: When comparing across modalities, similar size of acute hemorrhage in the left basal ganglia. No visible acute infarct or mass lesion; however, acute blood products limits assessment. Electronically Signed   By: Feliberto Harts M.D.   On: 11/18/2021 14:50   CT ANGIO HEAD NECK  W WO CM (CODE STROKE)  Result Date: 11/18/2021 CLINICAL DATA:  Code stroke. 61 year male with weakness. Acute left basal ganglia hemorrhage on plain CT. EXAM: CT ANGIOGRAPHY HEAD AND NECK TECHNIQUE: Multidetector CT imaging of the head and neck was performed using the standard protocol during bolus administration of intravenous contrast. Multiplanar CT image reconstructions and MIPs were obtained to evaluate the vascular anatomy. Carotid stenosis measurements (when applicable) are obtained utilizing NASCET criteria, using the distal internal carotid diameter as the denominator. RADIATION DOSE REDUCTION: This exam was performed according to the departmental dose-optimization program which includes automated exposure control, adjustment of the mA and/or kV according to patient size and/or use of iterative reconstruction technique. CONTRAST:  75mL OMNIPAQUE IOHEXOL 350 MG/ML SOLN COMPARISON:  Plain head CT 1132 hours today. FINDINGS: CTA NECK Skeleton: Cervical spine degeneration with developing degenerative appearing upper cervical multilevel posterior element ankylosis. No acute or suspicious osseous lesion identified. Upper chest: Centrilobular and paraseptal emphysema. Negative visible superior mediastinum. Other neck: Chronic nasal bone fractures. No acute finding identified. Aortic arch: 3 vessel arch configuration. Mild Calcified aortic atherosclerosis. Right carotid system: Brachiocephalic artery and proximal right CCA are negative. Minimal plaque in the distal right CCA and at the bifurcation primarily affecting the ECA. No stenosis. Left carotid system: Intermittent mild left CCA plaque before the bifurcation without stenosis. Mild calcified plaque at the bifurcation more affecting the ECA. No stenosis. Vertebral arteries: Proximal right subclavian artery and right vertebral artery origin plaque  with only mild vertebral origin stenosis (series 8, image 103). Right vertebral appears somewhat non dominant  but patent to the skull base without additional stenosis. Proximal left subclavian artery plaque with no significant stenosis. Mild plaque at the left vertebral artery origin with mild if any stenosis. Mildly dominant left vertebral artery is patent to the skull base with no additional stenosis. CTA HEAD Posterior circulation: Dominant left V4 segment especially distal to the patent right PICA origin. Normal left PICA. Mild left V4 calcified plaque. No significant distal vertebral or vertebrobasilar junction stenosis. Patent basilar artery without stenosis. Patent SCA and PCA origins. Right PCA branches are within normal limits. Left PCA mild P2 segment irregularity with no significant stenosis. Anterior circulation: Both ICA siphons are patent. Mild, mostly supraclinoid calcified plaque bilaterally. Mild supraclinoid ICA stenosis more pronounced on the right. Patent carotid termini. Patent MCA and ACA origins. Anterior communicating artery and ACA branches are within normal limits. The left A2 appears to be dominant. Right MCA M1 segment and bifurcation are patent without stenosis. Right MCA branches are within normal limits. No left hemisphere CTA spot sign. Left MCA M1 segment is tortuous without stenosis. Patent left MCA bifurcation without stenosis. Left MCA branches are patent with some mild M2 and M3 irregularity in the middle sylvian division. Venous sinuses: Early contrast timing, not well evaluated. Anatomic variants: Dominant left vertebral artery. Dominant left ACA A2. Review of the MIP images confirms the above findings IMPRESSION: 1. No CTA spot sign in association with left basal ganglia hemorrhage. Negative for large vessel occlusion. 2. Generally mild atherosclerosis in the head and neck. No hemodynamically significant stenosis. 3. Cervical spine degeneration. Aortic Atherosclerosis (ICD10-I70.0) and Emphysema (ICD10-J43.9). Salient findings were communicated to Dr. Derry LoryKhaliqdina at 12:05 pm on  11/18/2021 by text page via the [facility] messaging system. Electronically Signed   By: Odessa FlemingH  Hall M.D.   On: 11/18/2021 12:05   CT HEAD CODE STROKE WO CONTRAST  Result Date: 11/18/2021 CLINICAL DATA:  Code stroke.  [age] year male with weakness. EXAM: CT HEAD WITHOUT CONTRAST TECHNIQUE: Contiguous axial images were obtained from the base of the skull through the vertex without intravenous contrast. RADIATION DOSE REDUCTION: This exam was performed according to the departmental dose-optimization program which includes automated exposure control, adjustment of the mA and/or kV according to patient size and/or use of iterative reconstruction technique. COMPARISON:  Head CT 08/24/2010. FINDINGS: Brain: Globular hyperdense hemorrhage at the left basal ganglia, centered at the lentiform encompasses 19 x 22 x 21 mm (AP by transverse by CC), estimated volume 4 mL. Mild surrounding edema. No significant mass effect. No intraventricular or extra-axial extension of blood. Generalized cerebral volume loss since 2012 seems advanced for age. No disproportionate ventriculomegaly. No midline shift. Patchy additional bilateral cerebral white matter hypodensity. No superimposed cortically based acute infarct identified. Vascular: Calcified atherosclerosis at the skull base. No suspicious intracranial vascular hyperdensity. Skull: No acute osseous abnormality identified. Sinuses/Orbits: Visualized paranasal sinuses and mastoids are clear. Other: Visualized orbits and scalp soft tissues are within normal limits. ASPECTS [facility]([place] Stroke Program Early CT Score) Total score (0-10 with 10 being normal): Not applicable, acute hemorrhage Study discussed by telephone with Dr. Derry LoryKhaliqdina on 11/18/2021 at 11:46 . IMPRESSION: 1. 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 Mild surrounding edema. No significant mass effect. This was briefly discussed by telephone with Dr. Derry LoryKhaliqdina on 11/18/2021 at 11:46. 2. Evidence of underlying chronic  small vessel disease. Generalized brain volume loss since 2012 seems advanced  for age. 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 Electronically Signed   By: Odessa Fleming M.D.   On: 11/18/2021 11:49     PHYSICAL EXAM  Temp:  [96.8 F (36 C)-99 F (37.2 C)] 97.8 F (36.6 C) (11/15 0800) Pulse Rate:  [71-126] 85 (11/15 0900) Resp:  [16-31] 16 (11/15 0900) BP: (84-154)/(73-106) 135/82 (11/15 0900) SpO2:  [86 %-98 %] 98 % (11/15 0900)  General - thin built, well developed, drowsy sleepy.  Ophthalmologic - fundi not visualized due to noncooperation.  Cardiovascular - Regular rhythm and rate.  Mental Status -  Severe dysarthria, with intangible words, stated his age wrong but able to follow simple commands on the left hand and foot.  Cranial Nerves II - XII - II - Visual field intact OU. III, IV, VI - Extraocular movements intact. V - Facial sensation decreased on the right. VII - Facial movement showed right facial droop. VIII - Hearing & vestibular intact bilaterally. X - severe dysarthria XI - Chin turning & shoulder shrug intact bilaterally. XII - Tongue protrusion intact.  Motor Strength - The patient's strength was spontaneous movement against gravity at LUE and LLE and RUE deltoid 0/5, bicep 1/5, tricep 1/5. RLE proximal and distal 1/5, mild withdraw to pain stimulation.  Bulk was normal and fasciculations were absent.   Motor Tone - Muscle tone was assessed at the neck and appendages and was normal.  Reflexes - The patient's reflexes were symmetrical in all extremities and he had no pathological reflexes.  Sensory - Light touch, temperature/pinprick were not cooperative on exma  Coordination - not cooperative on exam.  Tremor was absent.  Gait and Station - deferred.  Neuro - drowsy sleepy, eyes open with repetitive stimulation, mumbles to answer orientation questions, intangible. No gaze palsy, eyes midline, tracking mildly bilaterally, blinking to visual threat bilaterally, PERRL.  Right facial droop. Tongue protrusion not cooperative. LUE 3/5 and LLE 2+/5 at least. However, RUE and RLE mild withdraw to pain. Sensation, coordination and gait not tested.   ASSESSMENT/PLAN [name] is a 61 y.o. male with history of hypertension, smoker, heavy alcoholic admitted for right-sided numbness and weakness, right facial droop, slurred speech. No tPA given due to ICH.    ICH:  left BG ICH, likely due to small vessel disease given multiple uncontrolled risk factors CT showed left BG small ICH CTA head and neck unremarkable, no aneurysm or AVM MRI left BG small ICH CT repeat stable hematoma x 2 2D Echo EF 60 to 65% LDL 62 HgbA1c 5.1 UDS positive for THC Heparin subcu for VTE prophylaxis No antithrombotic prior to admission, now on No antithrombotic given ICH Ongoing aggressive stroke risk factor management Therapy recommendations: CIR Disposition: Pending  Hypertension Stable Labetalol IV PRN Consider po BP meds once po access Long term BP goal normotensive  Tobacco abuse Current smoker Smoking cessation counseling provided Pt is willing to quit  Alcohol withdraw Delirium tremens Heavy alcohol user, 6-10 beers every day On CIWA protocol Thiamine/folic acid/multivitamin Limitation of alcohol use education provided On phenobarbital taper EEG pending  B12 deficiency B12 = 177 MCV and MCH elevated On B12 supplement  Dysphagia  Did not pass swallow Now NPO On IVF @ 74 Will put in cortrak to start TF  Other Stroke Risk Factors   Other Active Problems   Hospital day # 4   Marvel Plan, MD PhD Stroke Neurology 11/22/2021 11:50 AM    To contact Stroke Continuity provider, please refer to [email or web address]. After hours, contact General  Neurology

## 2021-11-22 NOTE — Procedures (Signed)
Cortrak  Person Inserting Tube:  Guido Comp C, RD Tube Type:  Cortrak - 43 inches Tube Size:  10 Tube Location:  Left nare Secured by: Bridle Technique Used to Measure Tube Placement:  Marking at nare/corner of mouth Cortrak Secured At:  67 cm   Cortrak Tube Team Note:  Consult received to place a Cortrak feeding tube.   X-ray is required, abdominal x-ray has been ordered by the Cortrak team. Please confirm tube placement before using the Cortrak tube.   If the tube becomes dislodged please keep the tube and contact the Cortrak team at [email or web address] for replacement.  If after hours and replacement cannot be delayed, place a NG tube and confirm placement with an abdominal x-ray.    Brent Warren P., RD, LDN, CNSC See AMiON for contact information    

## 2021-11-22 NOTE — Progress Notes (Signed)
CSW will follow for medical progression as SNF requested. Of note, will likely be limited bed offers due to insurance.   Joaquin Courts, MSW, [facility]

## 2021-11-22 NOTE — Procedures (Signed)
Patient Name: Winford Hehn  MRN: [id number]  Epilepsy Attending: Charlsie Quest  Referring Physician/Provider: Marvel Plan, MD  Date: 11/22/2021 Duration: 22.28 mins  Patient history: 61 year old male with left basal ganglia hemorrhage and altered mental status.  EEG to evaluate for seizure.  Level of alertness: Awake  AEDs during EEG study: Phenobarb  Technical aspects: This EEG study was done with scalp electrodes positioned according to the 10-20 International system of electrode placement. Electrical activity was reviewed with band pass filter of 1-70Hz , sensitivity of 7 uV/mm, display speed of 17mm/sec with a 60Hz  notched filter applied as appropriate. EEG data were recorded continuously and digitally stored.  Video monitoring was available and reviewed as appropriate.  Description: EEG showed continuous generalized 3 to 6 Hz theta-delta slowing admixed with an excessive amount of 15 to 18 Hz beta activity distributed symmetrically and diffusely. Hyperventilation and photic stimulation were not performed.     ABNORMALITY - Continuous slow, generalized - Excessive beta, generalized  IMPRESSION: This study is suggestive of moderate to severe diffuse encephalopathy, nonspecific etiology. No seizures or epileptiform discharges were seen throughout the recording.  Ameen Mostafa 

## 2021-11-22 NOTE — Progress Notes (Signed)
Initial Nutrition Assessment  DOCUMENTATION CODES:   Severe malnutrition in context of social or environmental circumstances  INTERVENTION:   Initiate tube feeding via Cortrak (tip in stomach): Peptamen 1.5 at 55 ml/h (1320 ml per day) *Start at 2mL/hr and advance by 42mL Q24H due to high risk of refeeding syndrome Provides 1980 kcal, 90 gm protein, 1014 ml free water daily  - Monitor electrolytes BID and replace as needed. Patient at high risk for refeeding syndrome due to history of alcohol abuse, malnourished state, and poor intake since admission with unknown intake PTA.  - Free water flushes per MD. Patient noted to be hyponatremic. - Consider Q4H to provide a total of 1939mL/day - Continue 100mg  thiamine for refeeding risk - Continue multivitamin with minerals and folic acid for history of alcohol abuse.  - Continue vitamin B12 to aid in correcting deficiency.   NUTRITION DIAGNOSIS:   Severe Malnutrition related to social / environmental circumstances as evidenced by severe fat depletion, severe muscle depletion.  GOAL:   Patient will meet greater than or equal to 90% of their needs  MONITOR:   Diet advancement, TF tolerance, Labs  REASON FOR ASSESSMENT:   Consult Enteral/tube feeding initiation and management  ASSESSMENT:   61 y.o. male with PMH HTN, alcohol abuse, and COPD who presented after ICH.  11/13 SLP rec'd NPO 11/15 Cortrak placed, starting tube feeds  Patient in restraints and mumbling with eyes closed at time of visit. Did not respond to questioning. No family at bedside.    Pt discussed with MD. Per MD, can place Cortrak order. Will start tube feeds after tube Xray verified. Will plan to start at 23mL/hr and advance very slowly as concerned patient at high risk of refeeding syndrome. Discussed slow advancement with RN. Patient has been receiving Dextrose 5% which has been providing calories. MD to discontinue now that starting tube  feeds.   Medications reviewed and include: Vitamin B12, Folic acid, Multivitamin with minerals, Thiamine, Senokot,   Labs reviewed:  Na 134 Vitamin B12 177   NUTRITION - FOCUSED PHYSICAL EXAM:  Flowsheet Row Most Recent Value  Orbital Region Severe depletion  Upper Arm Region Severe depletion  Thoracic and Lumbar Region Severe depletion  Buccal Region Moderate depletion  Temple Region Severe depletion  Clavicle Bone Region Severe depletion  Clavicle and Acromion Bone Region Severe depletion  Scapular Bone Region Unable to assess  Dorsal Hand Unable to assess  Patellar Region Severe depletion  Anterior Thigh Region Severe depletion  Posterior Calf Region Severe depletion  Edema (RD Assessment) None  Hair Reviewed  Eyes Unable to assess  Mouth Unable to assess  Skin Unable to assess  Nails Reviewed       Diet Order:   Diet Order             Diet NPO time specified  Diet effective now                   EDUCATION NEEDS:   Not appropriate for education at this time  Skin:  Skin Assessment: Reviewed RN Assessment  Last BM:  11/13  Height:  Ht Readings from Last 1 Encounters:  11/22/21 5\' 9"  (1.753 m)   Weight:  Wt Readings from Last 1 Encounters:  11/18/21 59.2 kg    BMI:  Body mass index is 19.27 kg/m.  Estimated Nutritional Needs:  Kcal:  1800-2050 kcal Protein:  65-90 grams Fluid:  >/= 1.8L    RD, LDN For  contact information, refer to [facility].

## 2021-11-22 NOTE — Progress Notes (Signed)
EEG complete - results pending 

## 2021-11-22 NOTE — Progress Notes (Signed)
OT Cancellation Note  Patient Details Name: Brent Warren MRN: [id number] DOB: 1960/10/10   Cancelled Treatment:    Reason Eval/Treat Not Completed: Other (comment) (EEG being placed and s/p ativan with pt very groggy at present. Ot to attempt at a more appropriate time)  Mateo Flow 11/22/2021, 3:04 PM

## 2021-11-22 NOTE — Progress Notes (Signed)
Marlin Canary DO notified at this time about patient being extremely agitated and HR 130-140s despite q4 ativan being given. One time dose of 1 mg ativan ordered.   Sherral Hammers RN

## 2021-11-22 NOTE — Progress Notes (Signed)
PROGRESS NOTE  Brent Warren EGB:[id number] DOB: December 03, 1960 DOA: 11/18/2021 PCP: Marcine Matar, MD   LOS: 4 days   Brief Narrative / Interim history: 61 year old male with history of HTN, tobacco use, EtOH use who comes into the hospital with sudden onset slurred speech and right-sided weakness.  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  He was admitted to the neuro ICU, remained stable and transferred to the hospitalist service on 11/13.  He started to develop increased withdrawal symptoms on 11/13.     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  Neurology consulted and following patient.  He underwent stroke work-up, 2D echo shows EF 60-65%.  LDL is 62, A1c is 5.1. -PT/OT recommends CIR -Appreciate neurology follow-up -did not pass SLP eval-- will place cortrack  Alcohol abuse, alcohol withdrawal with delirium tremens -patient with significant alcohol withdrawal symptoms, PCCM consulted and have since signed off  -was on phenobarbital protocol -Vitamin B1 still pending this morning -Monitor, remains at risk for withdrawal seizures  Elevated LFTs -due to alcohol abuse -trending down  Essential hypertension -continue to monitor  Tobacco use, history of emphysema -resume home albuterol PRN  Hyponatremia -mild, overall stable  B12 deficiency, elevated MCV -folic acid  was normal, but B12 was found to be low.   -Replete IM  Scheduled Meds:  amLODipine  5 mg Oral Daily   Chlorhexidine Gluconate Cloth  6 each Topical Daily   cyanocobalamin  1,000 mcg Intramuscular Daily   folic acid  1 mg Intravenous Daily   heparin injection (subcutaneous)  5,000 Units Subcutaneous Q8H   multivitamin with minerals  1 tablet Oral Daily   pantoprazole  40 mg Oral Daily   PHENObarbital  97.5 mg Intravenous Q8H   Followed by   PHENObarbital  65 mg Intravenous Q8H   Followed by   Melene Muller ON 11/24/2021] PHENObarbital  32.5 mg Intravenous Q8H   senna-docusate  1 tablet Oral BID   sodium chloride flush  3 mL Intravenous Once   thiamine  100 mg Oral Daily   Or   thiamine  100 mg Intravenous Daily   Continuous Infusions:  dextrose 5 % and 0.9% NaCl 75 mL/hr at 11/22/21 0900   PRN Meds:.acetaminophen **OR** acetaminophen (TYLENOL) oral liquid 160 mg/5 mL **OR** acetaminophen, ipratropium-albuterol, labetalol, LORazepam, mouth rinse  Current Outpatient Medications  Medication Instructions   albuterol (PROVENTIL) 2.5 mg, Nebulization, Every 6 hours PRN   albuterol (VENTOLIN HFA) 108 (90 Base) MCG/ACT inhaler 2 puffs, Inhalation, Every 6 hours PRN   amLODipine (NORVASC) 10 mg, Oral, Daily   budesonide-formoterol (SYMBICORT) 80-4.5 MCG/ACT inhaler Inhale 2 puffs into the lungs 2 (two) times daily   loperamide (IMODIUM A-D) 2 mg, Oral, 2 times daily PRN   meloxicam (MOBIC) 15 mg, Oral, Daily    Diet Orders (From admission, onward)     Start     Ordered   11/20/21 1025  Diet NPO time specified  Diet effective now        11/20/21 1024            DVT prophylaxis: heparin injection 5,000 Units Start: 11/19/21 1400 SCD's Start: 11/18/21 1137   Lab Results  Component Value Date   PLT 185 11/21/2021      Code Status: Full Code  Family Communication: no family at bedside  Status is: Inpatient  Remains inpatient appropriate because: severity of illness  Level  of care: Telemetry Medical  Consultants:  Neurology PCCM  Objective: Vitals:   11/22/21 0600 11/22/21 0700 11/22/21 0800 11/22/21 0900  BP: 115/79 (!) 144/100 (!) 148/90 135/82  Pulse: 72 (!) 115 99 85  Resp: 19 18 18 16   Temp:   97.8 F (36.6 C)   TempSrc:   Axillary   SpO2: 94% 96% 97% 98%  Weight:      Height:    5\' 9"  (1.753 m)    Intake/Output Summary (Last 24 hours) at 11/22/2021 0955 Last data filed at 11/22/2021 0900 Gross per 24 hour  Intake 1205.66 ml  Output 850 ml  Net 355.66 ml   Wt Readings from Last 3 Encounters:  11/18/21 59.2 kg  07/06/21 58.5 kg  02/10/21 61.7 kg    Examination:   General: Appearance:    Thin male confused in bed, sideways     Lungs:     respirations unlabored  Heart:    Normal heart rate.  MS:   All extremities are intact.   Neurologic:   Will awaken, does not follow commands, speech is slurred    Data Reviewed: I have independently reviewed following labs and imaging studies   CBC Recent Labs  Lab 11/18/21 1130 11/18/21 1134 11/19/21 0231 11/21/21 0403  WBC 7.5  --  5.7 7.9  HGB 13.6 15.0 13.5 15.3  HCT 38.8* 44.0 38.1* 45.7  PLT 224  --  214 185  MCV 104.6*  --  102.1* 106.0*  MCH 36.7*  --  36.2* 35.5*  MCHC 35.1  --  35.4 33.5  RDW 13.2  --  13.2 12.9  LYMPHSABS 2.3  --   --   --   MONOABS 0.7  --   --   --   EOSABS 0.2  --   --   --   BASOSABS 0.1  --   --   --     Recent Labs  Lab 11/18/21 1130 11/18/21 1134 11/19/21 0231 11/21/21 0403  NA 132* 131* 134* 134*  K 4.2 4.3 4.3 4.0  CL 96* 97* 99 101  CO2 21*  --  21* 20*  GLUCOSE 93 88 99 95  BUN <5* 3* 7* 8  CREATININE 0.59* 0.80 0.65 0.69  CALCIUM 9.1  --  9.3 9.8  AST 76*  --  59* 56*  ALT 38  --  34 31  ALKPHOS 58  --  53 61  BILITOT 0.5  --  0.7 0.6  ALBUMIN 4.0  --  3.8 4.0  MG  --   --  1.7 1.8  INR 1.0  --  1.0  --   HGBA1C  --   --  5.1  --      ------------------------------------------------------------------------------------------------------------------ No results for input(s): "CHOL", "HDL", "LDLCALC", "TRIG", "CHOLHDL", "LDLDIRECT" in the last 72 hours.  Lab Results  Component Value Date   HGBA1C 5.1 11/19/2021   ------------------------------------------------------------------------------------------------------------------ No results for input(s): "TSH", "T4TOTAL", "T3FREE", "THYROIDAB" in the last 72 hours.  Invalid input(s): "FREET3"  Cardiac Enzymes No results for input(s): "CKMB", "TROPONINI", "MYOGLOBIN" in the last 168 hours.  Invalid input(s): "CK" ------------------------------------------------------------------------------------------------------------------ No results found for: "BNP"  CBG: Recent Labs  Lab 11/18/21 1129  GLUCAP 95    Recent Results (from the past 240 hour(s))  MRSA Next Gen by PCR, Nasal     Status: None   Collection Time: 11/18/21  1:13 PM   Specimen: Nasal Mucosa; Nasal Swab  Result Value Ref Range Status   MRSA by PCR Next Gen NOT DETECTED NOT DETECTED Final    Comment: (NOTE) The GeneXpert MRSA Assay (FDA approved for NASAL specimens only), is one component of a comprehensive MRSA colonization surveillance program. It is not intended to diagnose MRSA infection nor to guide or monitor treatment for MRSA infections. Test performance is not FDA approved in patients less than 59 years old. Performed at [facility], [address], [place], [place] [address]      Radiology Studies: No results found.   Marlin Canary DO [facility]  Between 7 am - 7 pm I am available, please contact me via Amion (for emergencies) or Securechat (non urgent messages)  Between 7 pm - 7 am I am not available, please contact night coverage MD/APP via Amion

## 2021-11-23 DIAGNOSIS — F17200 Nicotine dependence, unspecified, uncomplicated: Secondary | ICD-10-CM | POA: Diagnosis not present

## 2021-11-23 DIAGNOSIS — F10931 Alcohol use, unspecified with withdrawal delirium: Secondary | ICD-10-CM | POA: Diagnosis not present

## 2021-11-23 DIAGNOSIS — I10 Essential (primary) hypertension: Secondary | ICD-10-CM | POA: Diagnosis not present

## 2021-11-23 DIAGNOSIS — R1310 Dysphagia, unspecified: Secondary | ICD-10-CM | POA: Diagnosis not present

## 2021-11-23 DIAGNOSIS — I610 Nontraumatic intracerebral hemorrhage in hemisphere, subcortical: Secondary | ICD-10-CM | POA: Diagnosis not present

## 2021-11-23 DIAGNOSIS — I619 Nontraumatic intracerebral hemorrhage, unspecified: Secondary | ICD-10-CM | POA: Diagnosis not present

## 2021-11-23 LAB — BASIC METABOLIC PANEL
Anion gap: 13 (ref 5–15)
BUN: 14 mg/dL (ref 8–23)
CO2: 19 mmol/L — ABNORMAL LOW (ref 22–32)
Calcium: 9.3 mg/dL (ref 8.9–10.3)
Chloride: 102 mmol/L (ref 98–111)
Creatinine, Ser: 0.6 mg/dL — ABNORMAL LOW (ref 0.61–1.24)
GFR, Estimated: >60 mL/min (ref >60)
Glucose, Bld: 96 mg/dL (ref 70–99)
Potassium: 3 mmol/L — ABNORMAL LOW (ref 3.5–5.1)
Sodium: 134 mmol/L — ABNORMAL LOW (ref 135–145)

## 2021-11-23 LAB — PHOSPHORUS
Phosphorus: 3.3 mg/dL (ref 2.5–4.6)
Phosphorus: 3.4 mg/dL (ref 2.5–4.6)

## 2021-11-23 LAB — CBC
HCT: 40.3 % (ref 39.0–52.0)
Hemoglobin: 14.2 g/dL (ref 13.0–17.0)
MCH: 36.4 pg — ABNORMAL HIGH (ref 26.0–34.0)
MCHC: 35.2 g/dL (ref 30.0–36.0)
MCV: 103.3 fL — ABNORMAL HIGH (ref 80.0–100.0)
Platelets: 209 10*3/uL (ref 150–400)
RBC: 3.9 MIL/uL — ABNORMAL LOW (ref 4.22–5.81)
RDW: 12.7 % (ref 11.5–15.5)
WBC: 9.3 10*3/uL (ref 4.0–10.5)
nRBC: 0 % (ref 0.0–0.2)

## 2021-11-23 LAB — GLUCOSE, CAPILLARY
Glucose-Capillary: 109 mg/dL — ABNORMAL HIGH (ref 70–99)
Glucose-Capillary: 111 mg/dL — ABNORMAL HIGH (ref 70–99)
Glucose-Capillary: 100 mg/dL — ABNORMAL HIGH (ref 70–99)
Glucose-Capillary: 113 mg/dL — ABNORMAL HIGH (ref 70–99)
Glucose-Capillary: 120 mg/dL — ABNORMAL HIGH (ref 70–99)

## 2021-11-23 LAB — MAGNESIUM
Magnesium: 1.7 mg/dL (ref 1.7–2.4)
Magnesium: 2.6 mg/dL — ABNORMAL HIGH (ref 1.7–2.4)

## 2021-11-23 MED ORDER — POTASSIUM CHLORIDE 20 MEQ PO PACK
40.0000 meq | PACK | ORAL | Status: AC
  Administered 2021-11-23 (×2): 40 meq
  Filled 2021-11-23 (×2): qty 2

## 2021-11-23 MED ORDER — SODIUM CHLORIDE 0.9 % IV SOLN: INTRAVENOUS | Status: DC

## 2021-11-23 MED ORDER — AMLODIPINE BESYLATE 10 MG PO TABS
10.0000 mg | ORAL_TABLET | Freq: Every day | ORAL | Status: DC
  Administered 2021-11-23 – 2021-12-15 (×23): 10 mg
  Filled 2021-11-23 (×24): qty 1

## 2021-11-23 MED ORDER — MAGNESIUM SULFATE 4 GM/100ML IV SOLN
4.0000 g | Freq: Once | INTRAVENOUS | Status: AC
  Administered 2021-11-23: 4 g via INTRAVENOUS
  Filled 2021-11-23: qty 100

## 2021-11-23 NOTE — Progress Notes (Signed)
PROGRESS NOTE  Brent Warren JME:[id number] DOB: 09-13-1960 DOA: 11/18/2021 PCP: Marcine Matar, MD   LOS: 5 days   Brief Narrative / Interim history: 61 year old male with history of HTN, tobacco use, EtOH use who comes into the hospital with sudden onset slurred speech and right-sided weakness.  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  He was admitted to the neuro ICU, remained stable and transferred to the hospitalist service on 11/13.  He started to develop increased withdrawal symptoms on 11/13.     Significant imaging / results / micro data: 11/11, CT head-acute left basal ganglia hemorrhage with mild surrounding edema 11/11, MRI brain-signs of acute hemorrhage in the left basal ganglia without acute infarct or mass lesions 11/15- cortrack  Subjective / 24h Interval events: Will open eyes and attempt to communicate  Assesement and Plan: Principal Problem:   ICH (intracerebral hemorrhage) (HCC) Active Problems:   Hyponatremia   HTN (hypertension)   Pulmonary emphysema (HCC)   Tobacco abuse   ETOH abuse   Alcohol withdrawal delirium (HCC)   Elevated MCV   Protein-calorie malnutrition, severe   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  Neurology consulted and following patient.  He underwent stroke work-up, 2D echo shows EF 60-65%.  LDL is 62, A1c is 5.1. -PT/OT recommends CIR -Appreciate neurology follow-up -did not pass SLP eval-- will place cortrack  Alcohol abuse, alcohol withdrawal with delirium tremens -patient with significant alcohol withdrawal symptoms, PCCM consulted and have since signed off  - on phenobarbital taper -Vitamin B1 pending -Monitor, remains at risk for withdrawal seizures  Elevated LFTs -due to alcohol abuse -trending down  Hypokalemia/hypomagnesemia -replete  Essential hypertension -continue to monitor  Tobacco use, history of emphysema -resume home albuterol PRN  Hyponatremia -mild, overall  stable  B12 deficiency, elevated MCV -folic acid was normal, but I29 was found to be low.   -Replete IM  Scheduled Meds:  amLODipine  10 mg Per Tube Daily   Chlorhexidine Gluconate Cloth  6 each Topical Daily   cyanocobalamin  1,000 mcg Intramuscular Daily   feeding supplement (PROSource TF20)  60 mL Per Tube Daily   folic acid  1 mg Per Tube Daily   heparin injection (subcutaneous)  5,000 Units Subcutaneous Q8H   multivitamin with minerals  1 tablet Per Tube Daily   mouth rinse  15 mL Mouth Rinse 4 times per day   pantoprazole (PROTONIX) IV  40 mg Intravenous QHS   PHENObarbital  65 mg Intravenous Q8H   Followed by   [START ON 11/24/2021] PHENObarbital  32.5 mg Intravenous Q8H   potassium chloride  40 mEq Per Tube Q4H   senna-docusate  1 tablet Per Tube BID   sodium chloride flush  3 mL Intravenous Once   thiamine  100 mg Per Tube Daily   Continuous Infusions:  sodium chloride     feeding supplement (PEPTAMEN 1.5 CAL) 10 mL/hr at 11/23/21 0900   magnesium sulfate bolus IVPB     PRN Meds:.acetaminophen **OR** acetaminophen (TYLENOL) oral liquid 160 mg/5 mL **OR** acetaminophen, ipratropium-albuterol, labetalol, LORazepam, mouth rinse  Current Outpatient Medications  Medication Instructions   albuterol (PROVENTIL) 2.5 mg, Nebulization, Every 6 hours PRN   albuterol (VENTOLIN HFA) 108 (90 Base) MCG/ACT inhaler 2 puffs, Inhalation, Every 6 hours PRN   amLODipine (NORVASC) 10 mg, Oral, Daily   budesonide-formoterol (SYMBICORT) 80-4.5 MCG/ACT inhaler Inhale 2 puffs into the lungs 2 (two) times daily   loperamide (IMODIUM A-D)  2 mg, Oral, 2 times daily PRN   meloxicam (MOBIC) 15 mg, Oral, Daily    Diet Orders (From admission, onward)     Start     Ordered   11/20/21 1025  Diet NPO time specified  Diet effective now        11/20/21 1024            DVT prophylaxis: heparin injection 5,000 Units Start: 11/19/21 1400 SCD's Start: 11/18/21 1137   Lab Results  Component  Value Date   PLT 209 11/23/2021      Code Status: Full Code  Family Communication: no family at bedside  Status is: Inpatient  Remains inpatient appropriate because: severity of illness  Level of care: Progressive  Consultants:  Neurology PCCM  Objective: Vitals:   11/23/21 0600 11/23/21 0700 11/23/21 0758 11/23/21 0800  BP: (!) 151/99 (!) 161/117 (!) 152/93 (!) 152/93  Pulse: (!) 114 68 (!) 116 (!) 115  Resp: 18 (!) 21 20 19   Temp:   97.9 F (36.6 C)   TempSrc:   Axillary   SpO2: 97% 98% 98% 98%  Weight: 54 kg     Height:        Intake/Output Summary (Last 24 hours) at 11/23/2021 1010 Last data filed at 11/23/2021 0900 Gross per 24 hour  Intake 578.57 ml  Output 695 ml  Net -116.43 ml   Wt Readings from Last 3 Encounters:  11/23/21 54 kg  07/06/21 58.5 kg  02/10/21 61.7 kg    Examination:   General: Appearance:    Thin male confused in bed, sideways     Lungs:     respirations unlabored  Heart:    Tachycardic.  MS:   All extremities are intact.   Neurologic:   Will awaken, does not follow commands, speech is slurred    Data Reviewed: I have independently reviewed following labs and imaging studies   CBC Recent Labs  Lab 11/18/21 1130 11/18/21 1134 11/19/21 0231 11/21/21 0403 11/23/21 0619  WBC 7.5  --  5.7 7.9 9.3  HGB 13.6 15.0 13.5 15.3 14.2  HCT 38.8* 44.0 38.1* 45.7 40.3  PLT 224  --  214 185 209  MCV 104.6*  --  102.1* 106.0* 103.3*  MCH 36.7*  --  36.2* 35.5* 36.4*  MCHC 35.1  --  35.4 33.5 35.2  RDW 13.2  --  13.2 12.9 12.7  LYMPHSABS 2.3  --   --   --   --   MONOABS 0.7  --   --   --   --   EOSABS 0.2  --   --   --   --   BASOSABS 0.1  --   --   --   --     Recent Labs  Lab 11/18/21 1130 11/18/21 1134 11/19/21 0231 11/21/21 0403 11/22/21 1700 11/23/21 0619  NA 132* 131* 134* 134*  --  134*  K 4.2 4.3 4.3 4.0  --  3.0*  CL 96* 97* 99 101  --  102  CO2 21*  --  21* 20*  --  19*  GLUCOSE 93 88 99 95  --  96  BUN <5* 3*  7* 8  --  14  CREATININE 0.59* 0.80 0.65 0.69  --  0.60*  CALCIUM 9.1  --  9.3 9.8  --  9.3  AST 76*  --  59* 56*  --   --   ALT 38  --  34 31  --   --  ALKPHOS 58  --  53 61  --   --   BILITOT 0.5  --  0.7 0.6  --   --   ALBUMIN 4.0  --  3.8 4.0  --   --   MG  --   --  1.7 1.8 1.8 1.7  INR 1.0  --  1.0  --   --   --   HGBA1C  --   --  5.1  --   --   --     ------------------------------------------------------------------------------------------------------------------ No results for input(s): "CHOL", "HDL", "LDLCALC", "TRIG", "CHOLHDL", "LDLDIRECT" in the last 72 hours.  Lab Results  Component Value Date   HGBA1C 5.1 11/19/2021   ------------------------------------------------------------------------------------------------------------------ No results for input(s): "TSH", "T4TOTAL", "T3FREE", "THYROIDAB" in the last 72 hours.  Invalid input(s): "FREET3"  Cardiac Enzymes No results for input(s): "CKMB", "TROPONINI", "MYOGLOBIN" in the last 168 hours.  Invalid input(s): "CK" ------------------------------------------------------------------------------------------------------------------ No results found for: "BNP"  CBG: Recent Labs  Lab November 30, 2021 1604 30-Nov-2021 1917 11-30-21 2317 11/23/21 0321 11/23/21 0744  GLUCAP 115* 107* 120* 109* 111*    Recent Results (from the past 240 hour(s))  MRSA Next Gen by PCR, Nasal     Status: None   Collection Time: 11/18/21  1:13 PM   Specimen: Nasal Mucosa; Nasal Swab  Result Value Ref Range Status   MRSA by PCR Next Gen NOT DETECTED NOT DETECTED Final    Comment: (NOTE) The GeneXpert MRSA Assay (FDA approved for NASAL specimens only), is one component of a comprehensive MRSA colonization surveillance program. It is not intended to diagnose MRSA infection nor to guide or monitor treatment for MRSA infections. Test performance is not FDA approved in patients less than 67 years old. Performed at [facility], [address], [place], [place] [address]      Radiology Studies: EEG adult  Result Date: Nov 30, 2021 Charlsie Quest, MD     11-30-21  9:31 PM Patient Name: Brent Warren MRN: [id number] Epilepsy Attending: Charlsie Quest Referring Physician/Provider: Marvel Plan, MD Date: 2021/11/30 Duration: 22.28 mins Patient history: 61 year old male with left basal ganglia hemorrhage and altered mental status.  EEG to evaluate for seizure. Level of alertness: Awake AEDs during EEG study: Phenobarb Technical aspects: This EEG study was done with scalp electrodes positioned according to the 10-20 International system of electrode placement. Electrical activity was reviewed with band pass filter of 1-70Hz , sensitivity of 7 uV/mm, display speed of 21mm/sec with a 60Hz  notched filter applied as appropriate. EEG data were recorded continuously and digitally stored.  Video monitoring was available and reviewed as appropriate. Description: EEG showed continuous generalized 3 to 6 Hz theta-delta slowing admixed with an excessive amount of 15 to 18 Hz beta activity distributed symmetrically and diffusely. Hyperventilation and photic stimulation were not performed.   ABNORMALITY - Continuous slow, generalized - Excessive beta, generalized IMPRESSION: This study is suggestive of moderate to severe diffuse encephalopathy, nonspecific etiology. No seizures or epileptiform discharges were seen throughout the recording.   DG Abd Portable 1V  Result Date: 11/30/21 CLINICAL DATA:  NG tube placement EXAM: PORTABLE ABDOMEN - 1 VIEW COMPARISON:  None Available. FINDINGS: Enteric tube tip projects over the stomach. No focal consolidation in the partially imaged lung bases. Non dilated bowel loops in the upper abdomen. IMPRESSION: Enteric tube tip projects over the stomach. Electronically Signed   By: 11/24/2021 M.D.   On: 11/30/2021 11:14     11/24/2021 DO [facility]  Between 7 am - 7 pm I am available, please  contact me via Amion (for emergencies) or Securechat (non urgent messages)  Between 7 pm - 7 am I am not available, please contact night coverage MD/APP via Amion

## 2021-11-23 NOTE — Progress Notes (Addendum)
STROKE TEAM PROGRESS NOTE   SUBJECTIVE (INTERVAL HISTORY) Patient is seen in his room with no family at the bedside.  He continues to be drowsy, and his speech is incomprehensible, although he is able to follow simple commands.  OBJECTIVE Temp:  [97.8 F (36.6 C)-100 F (37.8 C)] 97.9 F (36.6 C) (11/16 0758) Pulse Rate:  [68-120] 115 (11/16 0800) Resp:  [14-22] 19 (11/16 0800) BP: (106-161)/(71-117) 152/93 (11/16 0800) SpO2:  [93 %-98 %] 98 % (11/16 0800) Weight:  [54 kg] 54 kg (11/16 0600)  Recent Labs  Lab 11/22/21 1604 11/22/21 1917 11/22/21 2317 11/23/21 0321 11/23/21 0744  GLUCAP 115* 107* 120* 109* 111*    Recent Labs  Lab 11/18/21 1130 11/18/21 1134 11/19/21 0231 11/21/21 0403 11/22/21 1700 11/23/21 0619  NA 132* 131* 134* 134*  --  134*  K 4.2 4.3 4.3 4.0  --  3.0*  CL 96* 97* 99 101  --  102  CO2 21*  --  21* 20*  --  19*  GLUCOSE 93 88 99 95  --  96  BUN <5* 3* 7* 8  --  14  CREATININE 0.59* 0.80 0.65 0.69  --  0.60*  CALCIUM 9.1  --  9.3 9.8  --  9.3  MG  --   --  1.7 1.8 1.8 1.7  PHOS  --   --   --   --  2.8 3.3    Recent Labs  Lab 11/18/21 1130 11/19/21 0231 11/21/21 0403  AST 76* 59* 56*  ALT 38 34 31  ALKPHOS 58 53 61  BILITOT 0.5 0.7 0.6  PROT 8.0 7.7 8.3*  ALBUMIN 4.0 3.8 4.0    Recent Labs  Lab 11/18/21 1130 11/18/21 1134 11/19/21 0231 11/21/21 0403 11/23/21 0619  WBC 7.5  --  5.7 7.9 9.3  NEUTROABS 4.3  --   --   --   --   HGB 13.6 15.0 13.5 15.3 14.2  HCT 38.8* 44.0 38.1* 45.7 40.3  MCV 104.6*  --  102.1* 106.0* 103.3*  PLT 224  --  214 185 209    No results for input(s): "CKTOTAL", "CKMB", "CKMBINDEX", "TROPONINI" in the last 168 hours. No results for input(s): "LABPROT", "INR" in the last 72 hours.  No results for input(s): "COLORURINE", "LABSPEC", "PHURINE", "GLUCOSEU", "HGBUR", "BILIRUBINUR", "KETONESUR", "PROTEINUR", "UROBILINOGEN", "NITRITE", "LEUKOCYTESUR" in the last 72 hours.  Invalid input(s): "APPERANCEUR"      Component Value Date/Time   CHOL 160 11/19/2021 0231   CHOL 153 10/10/2020 1650   TRIG 48 11/19/2021 0231   HDL 88 11/19/2021 0231   HDL 75 10/10/2020 1650   CHOLHDL 1.8 11/19/2021 0231   VLDL 10 11/19/2021 0231   LDLCALC 62 11/19/2021 0231   LDLCALC 62 10/10/2020 1650   Lab Results  Component Value Date   HGBA1C 5.1 11/19/2021      Component Value Date/Time   LABOPIA NONE DETECTED 11/22/2021 0030   COCAINSCRNUR NONE DETECTED 11/22/2021 0030   LABBENZ POSITIVE (A) 11/22/2021 0030   AMPHETMU NONE DETECTED 11/22/2021 0030   THCU POSITIVE (A) 11/22/2021 0030   LABBARB POSITIVE (A) 11/22/2021 0030    Recent Labs  Lab 11/18/21 1130  ETH 201*     I have personally reviewed the radiological images below and agree with the radiology interpretations.  EEG adult  Result Date: 11/22/2021 Charlsie Quest, MD     11/22/2021  9:31 PM Patient Name: Brent Warren MRN: [id number] Epilepsy Attending: Charlsie Quest Referring Physician/Provider:  Marvel PlanXu, Abdulkadir Emmanuel, MD Date: 11/22/2021 Duration: 22.28 mins Patient history: 61 year old male with left basal ganglia hemorrhage and altered mental status.  EEG to evaluate for seizure. Level of alertness: Awake AEDs during EEG study: Phenobarb Technical aspects: This EEG study was done with scalp electrodes positioned according to the 10-20 International system of electrode placement. Electrical activity was reviewed with band pass filter of 1-70Hz , sensitivity of 7 uV/mm, display speed of 630mm/sec with a 60Hz  notched filter applied as appropriate. EEG data were recorded continuously and digitally stored.  Video monitoring was available and reviewed as appropriate. Description: EEG showed continuous generalized 3 to 6 Hz theta-delta slowing admixed with an excessive amount of 15 to 18 Hz beta activity distributed symmetrically and diffusely. Hyperventilation and photic stimulation were not performed.   ABNORMALITY - Continuous slow, generalized - Excessive  beta, generalized IMPRESSION: This study is suggestive of moderate to severe diffuse encephalopathy, nonspecific etiology. No seizures or epileptiform discharges were seen throughout the recording. Charlsie QuestPriyanka O Yadav   DG Abd Portable 1V  Result Date: 11/22/2021 CLINICAL DATA:  NG tube placement EXAM: PORTABLE ABDOMEN - 1 VIEW COMPARISON:  None Available. FINDINGS: Enteric tube tip projects over the stomach. No focal consolidation in the partially imaged lung bases. Non dilated bowel loops in the upper abdomen. IMPRESSION: Enteric tube tip projects over the stomach. Electronically Signed   By: Agustin CreeLimin  Paton Crum M.D.   On: 11/22/2021 11:14   CT HEAD WO CONTRAST (5MM)  Result Date: 11/20/2021 CLINICAL DATA:  Altered mental status, intraparenchymal hematoma. EXAM: CT HEAD WITHOUT CONTRAST TECHNIQUE: Contiguous axial images were obtained from the base of the skull through the vertex without intravenous contrast. RADIATION DOSE REDUCTION: This exam was performed according to the departmental dose-optimization program which includes automated exposure control, adjustment of the mA and/or kV according to patient size and/or use of iterative reconstruction technique. COMPARISON:  CT head 2 days prior. FINDINGS: Brain: The 2.2 cm x 1.7 cm x 1.7 cm intraparenchymal hematoma centered in the left basal ganglia is unchanged in size and appearance. There is mild surrounding edema without mass effect or midline shift. There is no new acute intracranial hemorrhage or extra-axial fluid collection. There is no acute territorial infarct. Parenchymal volume is stable. The ventricles are stable in size. There is no mass lesion. Vascular: No hyperdense vessel or unexpected calcification. Skull: Normal. Negative for fracture or focal lesion. Sinuses/Orbits: The imaged paranasal sinuses are clear. The globes and orbits are unremarkable. Other: None. IMPRESSION: Unchanged intraparenchymal hematoma centered in the left basal ganglia. No new  acute intracranial pathology. Electronically Signed   By: Lesia HausenPeter  Noone M.D.   On: 11/20/2021 15:31   ECHOCARDIOGRAM COMPLETE  Result Date: 11/19/2021    ECHOCARDIOGRAM REPORT   Patient Name:   Brent Warren Date of Exam: 11/19/2021 Medical Rec #:  [id number]   Height:       69.0 in Accession #:    [id number]  Weight:       130.5 lb Date of Birth:  10/14/1960    BSA:          1.723 m Patient Age:    61 years    BP:           116/100 mmHg Patient Gender: M           HR:           66 bpm. Exam Location:  Inpatient Procedure: 2D Echo, Cardiac Doppler, Color Doppler and Intracardiac  Opacification Agent Indications:    Stroke  History:        Patient has prior history of Echocardiogram examinations, most                 recent 05/02/2017. Risk Factors:Hypertension and Current Smoker.                 ETOH abuse.  Sonographer:    Ross Ludwig RDCS (AE) Referring Phys: [facility]  Sonographer Comments: Technically difficult study due to poor echo windows, suboptimal parasternal window, suboptimal apical window and suboptimal subcostal window. IMPRESSIONS  1. Left ventricular ejection fraction, by estimation, is 60 to 65%. The left ventricle has normal function. The left ventricle has no regional wall motion abnormalities. Left ventricular diastolic parameters are consistent with Grade I diastolic dysfunction (impaired relaxation).  2. Right ventricular systolic function is normal. The right ventricular size is normal. Tricuspid regurgitation signal is inadequate for assessing PA pressure.  3. The mitral valve is grossly normal. No evidence of mitral valve regurgitation.  4. The aortic valve was not well visualized. Aortic valve regurgitation is not visualized.  5. The inferior vena cava is normal in size with greater than 50% respiratory variability, suggesting right atrial pressure of 3 mmHg. Comparison(s): Changes from prior study are noted. 05/02/2017 (stress echo) - negative for ischemia, LVEF 50-55% at  baseline. FINDINGS  Left Ventricle: Left ventricular ejection fraction, by estimation, is 60 to 65%. The left ventricle has normal function. The left ventricle has no regional wall motion abnormalities. Definity contrast agent was given IV to delineate the left ventricular  endocardial borders. The left ventricular internal cavity size was normal in size. There is no left ventricular hypertrophy. Left ventricular diastolic parameters are consistent with Grade I diastolic dysfunction (impaired relaxation). Indeterminate filling pressures. Right Ventricle: The right ventricular size is normal. No increase in right ventricular wall thickness. Right ventricular systolic function is normal. Tricuspid regurgitation signal is inadequate for assessing PA pressure. Left Atrium: Left atrial size was normal in size. Right Atrium: Right atrial size was normal in size. Pericardium: There is no evidence of pericardial effusion. Mitral Valve: The mitral valve is grossly normal. No evidence of mitral valve regurgitation. Tricuspid Valve: The tricuspid valve is not well visualized. Tricuspid valve regurgitation is not demonstrated. Aortic Valve: The aortic valve was not well visualized. Aortic valve regurgitation is not visualized. Aortic valve mean gradient measures 2.0 mmHg. Aortic valve peak gradient measures 4.4 mmHg. Pulmonic Valve: The pulmonic valve was not well visualized. Pulmonic valve regurgitation is not visualized. Aorta: The aortic root and ascending aorta are structurally normal, with no evidence of dilitation. Venous: The inferior vena cava is normal in size with greater than 50% respiratory variability, suggesting right atrial pressure of 3 mmHg. IAS/Shunts: The interatrial septum appears to be lipomatous. No atrial level shunt detected by color flow Doppler.   Diastology LV e' medial:    4.35 cm/s LV E/e' medial:  12.6 LV e' lateral:   7.51 cm/s LV E/e' lateral: 7.3  RIGHT VENTRICLE             IVC RV Basal diam:   3.30 cm     IVC diam: 1.40 cm RV S prime:     13.50 cm/s TAPSE (M-mode): 1.5 cm LEFT ATRIUM           Index       RIGHT ATRIUM           Index LA Vol (A4C): 15.8 ml  9.17 ml/m  RA Area:     13.20 cm                                   RA Volume:   34.30 ml  19.90 ml/m  AORTIC VALVE AV Vmax:           105.00 cm/s AV Vmean:          69.800 cm/s AV VTI:            0.198 m AV Peak Grad:      4.4 mmHg AV Mean Grad:      2.0 mmHg LVOT Vmax:         92.50 cm/s LVOT Vmean:        56.200 cm/s LVOT VTI:          0.157 m LVOT/AV VTI ratio: 0.79 MITRAL VALVE MV Area (PHT): 2.99 cm    SHUNTS MV Decel Time: 254 msec    Systemic VTI: 0.16 m MV E velocity: 54.80 cm/s MV A velocity: 74.80 cm/s MV E/A ratio:  0.73 Zoila Shutter MD Electronically signed by Zoila Shutter MD Signature Date/Time: 11/19/2021/2:04:56 PM    Final    CT HEAD WO CONTRAST  Result Date: 11/18/2021 CLINICAL DATA:  Hemorrhagic stroke. EXAM: CT HEAD WITHOUT CONTRAST TECHNIQUE: Contiguous axial images were obtained from the base of the skull through the vertex without intravenous contrast. RADIATION DOSE REDUCTION: This exam was performed according to the departmental dose-optimization program which includes automated exposure control, adjustment of the mA and/or kV according to patient size and/or use of iterative reconstruction technique. COMPARISON:  CT angiography dated 11/18/2021. FINDINGS: Brain: No significant interval change in the left basal ganglia hemorrhage. No new hemorrhage. There is mild age-related atrophy and chronic microvascular ischemic changes. No mass effect or midline shift. No extra-axial fluid collection. Vascular: No hyperdense vessel or unexpected calcification. Skull: Normal. Negative for fracture or focal lesion. Sinuses/Orbits: The visualized paranasal sinuses and mastoid air cells are clear. There is deviation of the nose to the left. Cerumen noted in the external auditory canals bilaterally. Other: None IMPRESSION: 1. No  significant interval change in the left basal ganglia hemorrhage. No new hemorrhage. 2. Mild age-related atrophy and chronic microvascular ischemic changes. Electronically Signed   By: Elgie Collard M.D.   On: 11/18/2021 19:34   MR BRAIN W WO CONTRAST  Result Date: 11/18/2021 CLINICAL DATA:  Neuro deficit, acute, stroke suspected EXAM: MRI HEAD WITHOUT AND WITH CONTRAST TECHNIQUE: Multiplanar, multiecho pulse sequences of the brain and surrounding structures were obtained without and with intravenous contrast. CONTRAST:  63mL GADAVIST GADOBUTROL 1 MMOL/ML IV SOLN COMPARISON:  CT head from the same day. FINDINGS: Brain: When comparing across modalities, similar size of acute hemorrhage in the left basal ganglia. Mild surrounding edema. No significant mass effect. No midline shift. No surrounding restricted diffusion to suggest acute fracture. No visible mass lesion; however, acute blood products limit assessment. Patchy T2/FLAIR hyperintensity in the white matter, nonspecific but compatible with chronic microvascular disease. Cerebral atrophy. No hydrocephalus. No pathologic enhancement. Vascular: Major arterial flow voids are maintained skull base. Skull and upper cervical spine: Normal marrow signal. Sinuses/Orbits: Clear sinuses.  No acute orbital findings. Other: No mastoid effusions. IMPRESSION: When comparing across modalities, similar size of acute hemorrhage in the left basal ganglia. No visible acute infarct or mass lesion; however, acute blood products limits assessment. Electronically Signed   By:  Feliberto Harts M.D.   On: 11/18/2021 14:50   CT ANGIO HEAD NECK W WO CM (CODE STROKE)  Result Date: 11/18/2021 CLINICAL DATA:  Code stroke. 61 year male with weakness. Acute left basal ganglia hemorrhage on plain CT. EXAM: CT ANGIOGRAPHY HEAD AND NECK TECHNIQUE: Multidetector CT imaging of the head and neck was performed using the standard protocol during bolus administration of intravenous  contrast. Multiplanar CT image reconstructions and MIPs were obtained to evaluate the vascular anatomy. Carotid stenosis measurements (when applicable) are obtained utilizing NASCET criteria, using the distal internal carotid diameter as the denominator. RADIATION DOSE REDUCTION: This exam was performed according to the departmental dose-optimization program which includes automated exposure control, adjustment of the mA and/or kV according to patient size and/or use of iterative reconstruction technique. CONTRAST:  75mL OMNIPAQUE IOHEXOL 350 MG/ML SOLN COMPARISON:  Plain head CT 1132 hours today. FINDINGS: CTA NECK Skeleton: Cervical spine degeneration with developing degenerative appearing upper cervical multilevel posterior element ankylosis. No acute or suspicious osseous lesion identified. Upper chest: Centrilobular and paraseptal emphysema. Negative visible superior mediastinum. Other neck: Chronic nasal bone fractures. No acute finding identified. Aortic arch: 3 vessel arch configuration. Mild Calcified aortic atherosclerosis. Right carotid system: Brachiocephalic artery and proximal right CCA are negative. Minimal plaque in the distal right CCA and at the bifurcation primarily affecting the ECA. No stenosis. Left carotid system: Intermittent mild left CCA plaque before the bifurcation without stenosis. Mild calcified plaque at the bifurcation more affecting the ECA. No stenosis. Vertebral arteries: Proximal right subclavian artery and right vertebral artery origin plaque with only mild vertebral origin stenosis (series 8, image 103). Right vertebral appears somewhat non dominant but patent to the skull base without additional stenosis. Proximal left subclavian artery plaque with no significant stenosis. Mild plaque at the left vertebral artery origin with mild if any stenosis. Mildly dominant left vertebral artery is patent to the skull base with no additional stenosis. CTA HEAD Posterior circulation:  Dominant left V4 segment especially distal to the patent right PICA origin. Normal left PICA. Mild left V4 calcified plaque. No significant distal vertebral or vertebrobasilar junction stenosis. Patent basilar artery without stenosis. Patent SCA and PCA origins. Right PCA branches are within normal limits. Left PCA mild P2 segment irregularity with no significant stenosis. Anterior circulation: Both ICA siphons are patent. Mild, mostly supraclinoid calcified plaque bilaterally. Mild supraclinoid ICA stenosis more pronounced on the right. Patent carotid termini. Patent MCA and ACA origins. Anterior communicating artery and ACA branches are within normal limits. The left A2 appears to be dominant. Right MCA M1 segment and bifurcation are patent without stenosis. Right MCA branches are within normal limits. No left hemisphere CTA spot sign. Left MCA M1 segment is tortuous without stenosis. Patent left MCA bifurcation without stenosis. Left MCA branches are patent with some mild M2 and M3 irregularity in the middle sylvian division. Venous sinuses: Early contrast timing, not well evaluated. Anatomic variants: Dominant left vertebral artery. Dominant left ACA A2. Review of the MIP images confirms the above findings IMPRESSION: 1. No CTA spot sign in association with left basal ganglia hemorrhage. Negative for large vessel occlusion. 2. Generally mild atherosclerosis in the head and neck. No hemodynamically significant stenosis. 3. Cervical spine degeneration. Aortic Atherosclerosis (ICD10-I70.0) and Emphysema (ICD10-J43.9). Salient findings were communicated to Dr. Derry Lory at 12:05 pm on 11/18/2021 by text page via the [facility] messaging system. Electronically Signed   By: Odessa Fleming M.D.   On: 11/18/2021 12:05   CT HEAD  CODE STROKE WO CONTRAST  Result Date: 11/18/2021 CLINICAL DATA:  Code stroke.  61 year male with weakness. EXAM: CT HEAD WITHOUT CONTRAST TECHNIQUE: Contiguous axial images were obtained from the base  of the skull through the vertex without intravenous contrast. RADIATION DOSE REDUCTION: This exam was performed according to the departmental dose-optimization program which includes automated exposure control, adjustment of the mA and/or kV according to patient size and/or use of iterative reconstruction technique. COMPARISON:  Head CT 08/24/2010. FINDINGS: Brain: Globular hyperdense hemorrhage at the left basal ganglia, centered at the lentiform encompasses 19 x 22 x 21 mm (AP by transverse by CC), estimated volume 4 mL. Mild surrounding edema. No significant mass effect. No intraventricular or extra-axial extension of blood. Generalized cerebral volume loss since 2012 seems advanced for age. No disproportionate ventriculomegaly. No midline shift. Patchy additional bilateral cerebral white matter hypodensity. No superimposed cortically based acute infarct identified. Vascular: Calcified atherosclerosis at the skull base. No suspicious intracranial vascular hyperdensity. Skull: No acute osseous abnormality identified. Sinuses/Orbits: Visualized paranasal sinuses and mastoids are clear. Other: Visualized orbits and scalp soft tissues are within normal limits. ASPECTS [facility] Stroke Program Early CT Score) Total score (0-10 with 10 being normal): Not applicable, acute hemorrhage Study discussed by telephone with Dr. Derry Lory on 11/18/2021 at 11:46 . IMPRESSION: 1. 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 Mild surrounding edema. No significant mass effect. This was briefly discussed by telephone with Dr. Derry Lory on 11/18/2021 at 11:46. 2. Evidence of underlying chronic small vessel disease. Generalized brain volume loss since 2012 seems advanced for age. 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 Electronically Signed   By: Odessa Fleming M.D.   On: 11/18/2021 11:49     PHYSICAL EXAM  Temp:  [97.8 F (36.6 C)-100 F (37.8 C)] 97.9 F (36.6 C) (11/16 0758) Pulse Rate:  [68-120] 115 (11/16 0800) Resp:   [14-22] 19 (11/16 0800) BP: (106-161)/(71-117) 152/93 (11/16 0800) SpO2:  [93 %-98 %] 98 % (11/16 0800) Weight:  [54 kg] 54 kg (11/16 0600)  General - thin built, well developed, drowsy sleepy.  Ophthalmologic - fundi not visualized due to noncooperation.  Cardiovascular - Regular rhythm and rate.   Neuro - drowsy sleepy, eyes open with repetitive stimulation, mumbles to answer orientation questions, speech incomprehensible. No gaze palsy but nystagmus noted, eyes midline, tracking mildly bilaterally, blinking to visual threat bilaterally, PERRL. Right facial droop. Tongue protrusion not cooperative. LUE 3/5 and LLE 2+/5 at least, wiggles toes to command on RLE. Sensation, coordination and gait not tested.   ASSESSMENT/PLAN Brent Warren is a 61 y.o. male with history of hypertension, smoker, heavy alcoholic admitted for right-sided numbness and weakness, right facial droop, slurred speech. No tPA given due to ICH.    ICH:  left BG ICH, likely due to small vessel disease given multiple uncontrolled risk factors CT showed left BG small ICH CTA head and neck unremarkable, no aneurysm or AVM MRI left BG small ICH CT repeat stable hematoma x 2 2D Echo EF 60 to 65% LDL 62 HgbA1c 5.1 UDS positive for THC Heparin subcu for VTE prophylaxis No antithrombotic prior to admission, now on No antithrombotic given ICH Ongoing aggressive stroke risk factor management Therapy recommendations: CIR Disposition: Pending  Hypertension Stable Labetalol IV PRN On amlodipine 10 Long term BP goal normotensive  Tobacco abuse Current smoker Smoking cessation counseling provided Pt is willing to quit  Alcohol withdrawl Delirium tremens Heavy alcohol user, 6-10 beers every day On CIWA protocol Thiamine/folic acid/multivitamin Limitation  of alcohol use education provided On phenobarbital taper EEG moderate to severe diffuse encephalopathy   B12 deficiency B12 = 177 MCV and MCH elevated On  B12 supplement  Dysphagia  Did not pass swallow Now NPO On IVF @ 49 Had cortrak to start on trickle flow TF to avoid re-feeding syndrome  Other Stroke Risk Factors   Other Active Problems   Hospital day # 5  Patient seen by NP and then by MD Cortney E Ernestina Columbia , MSN, AGACNP-BC [facility] See Amion for schedule and pager information 11/23/2021 11:29 AM   ATTENDING NOTE: I reviewed above note and agree with the assessment and plan. Pt was seen and examined.   Pt lying in bed, lethargic but seems more awake alert than yesterday, on phenobarbital taper and received ativan PRN 3 times overnight. Still has severe dysarthria and intangible, but he is following commands. Still has RUE not against gravity but RLE 3/5 proximal. On cortrak with trickle flow to avoid re-feeding syndrome. Will put on gentle IVF to avoid dehydration. Continue PT/OT and speech. Will follow  For detailed assessment and plan, please refer to above/below as I have made changes wherever appropriate.   Marvel Plan, MD PhD Stroke Neurology 11/23/2021 3:23 PM     To contact Stroke Continuity provider, please refer to [email or web address]. After hours, contact General Neurology

## 2021-11-23 NOTE — Progress Notes (Signed)
Speech Language Pathology Treatment: Dysphagia;Cognitive-Linquistic  Patient Details Name: Brent Warren MRN: [id number] DOB: 1960-06-14 Today's Date: 11/23/2021 Time: 1275-1700 SLP Time Calculation (min) (ACUTE ONLY): 8 min  Assessment / Plan / Recommendation Clinical Impression  Pt is awake, but profoundly dysarthric and delirious. Does not make effort to move tongue at all during speech, though he was speaking and eating prior to onset of withdrawal symptoms on 11/13. Pt cannot seal lips to wet spoon or orally manipulate a 1/2 teaspoon of thick water despite heavy cueing. Pt eventually had a swallow response after total assist head and neck pavement and slight posterior tile to bed to facilitate bolus transit. Pts swallow is completely passive at this time. No recommendation for POs or instrumental testing yet. Will f/u.    HPI HPI: 61 year old male with history of HTN, tobacco use, EtOH use who comes into the hospital with sudden onset slurred speech and right-sided weakness.  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   He started to develop increased withdrawal symptoms on 11/13      SLP Plan  Continue with current plan of care      Recommendations for follow up therapy are one component of a multi-disciplinary discharge planning process, led by the attending physician.  Recommendations may be updated based on patient status, additional functional criteria and insurance authorization.    Recommendations  Diet recommendations: NPO                Oral Care Recommendations: Oral care QID Follow Up Recommendations: Skilled nursing-short term rehab (<3 hours/day) Assistance recommended at discharge: Frequent or constant Supervision/Assistance SLP Visit Diagnosis: Dysarthria and anarthria (R47.1) Plan: Continue with current plan of care           Sofie Schendel, Riley Nearing  11/23/2021, 2:45 PM

## 2021-11-23 NOTE — Progress Notes (Signed)
Occupational Therapy Treatment Patient Details Name: Brent Warren MRN: [id number] DOB: 01/31/1960 Today's Date: 11/23/2021   History of present illness 61 yo male presenting 11/11 with R-sided weakness, facial droop and slurred speech. 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 PMH includes: HTN, COPD, and alcohol abuse.   OT comments  Pt lethargic with decreased level of arousal, however arousal improved once sitting EOB. Inconsistently following 1 step commands. Pt trying to verbalize at times however unable to understand what he was saying. Due to deficits listed below, pt currently requires Total A with all ADL tasks and Max A with mobility. HR in the 160s with activity. Recommend rehab at SNF. Acute OT to follow.    Recommendations for follow up therapy are one component of a multi-disciplinary discharge planning process, led by the attending physician.  Recommendations may be updated based on patient status, additional functional criteria and insurance authorization.    Follow Up Recommendations  Skilled nursing-short term rehab (<3 hours/day)     Assistance Recommended at Discharge Frequent or constant Supervision/Assistance  Patient can return home with the following  Two people to help with walking and/or transfers;Two people to help with bathing/dressing/bathroom;Direct supervision/assist for medications management;Direct supervision/assist for financial management;Assistance with feeding;Assistance with cooking/housework;Assist for transportation;Help with stairs or ramp for entrance   Equipment Recommendations  BSC/3in1;Wheelchair (measurements OT);Wheelchair cushion (measurements OT)    Recommendations for Other Services      Precautions / Restrictions Precautions Precautions: Fall Precaution Comments: watch HR       Mobility Bed Mobility Overal bed mobility: Needs Assistance       Supine to sit: Mod assist, +2 for physical assistance Sit to supine: Mod assist, +2  for physical assistance   General bed mobility comments: up toward R side with helicopter transfer.  min stability and momentum assist to scoot symmetrically to EOB    Transfers Overall transfer level: Needs assistance Equipment used: 2 person hand held assist Transfers: Sit to/from Stand, Bed to chair/wheelchair/BSC Sit to Stand: Max assist, +2 physical assistance, Mod assist           General transfer comment: pt given extra time and knee guarded or braced to attempt mostly upright posture x2 trials.     Balance Overall balance assessment: Needs assistance Sitting-balance support: Single extremity supported, Feet supported Sitting balance-Leahy Scale: Poor (improving toward fair.) Sitting balance - Comments: pt still reliant on L UE, biased posteriorly, but maintaining balance for secs at a time.     Standing balance-Leahy Scale: Poor Standing balance comment: reliant on external support and knee bracing/guarding to maintain balance in upright stance.                           ADL either performed or assessed with clinical judgement   ADL Overall ADL's : Needs assistance/impaired                                       General ADL Comments: total A at this time    Extremity/Trunk Assessment Upper Extremity Assessment Upper Extremity Assessment: RUE deficits/detail;LUE deficits/detail RUE Deficits / Details: no movemetnnoted however increased flexor tone LUE Deficits / Details: gross movemetns however unable to use funcitonally at this time   Lower Extremity Assessment Lower Extremity Assessment: Defer to PT evaluation        Vision   Vision Assessment?:  Vision impaired- to be further tested in functional context Additional Comments: eyes closed at times during session   Perception Perception Perception:  (will further assess)   Praxis      Cognition Arousal/Alertness: Lethargic, Awake/alert Behavior During Therapy: Flat  affect Overall Cognitive Status: Difficult to assess (NT formally)                                 General Comments: making eye contact at times; attempting to communicate verbally        Exercises Exercises: Other exercises Other Exercises Other Exercises: BUE general AA/PROM through all ranges; not using funcitonally although moving LUE better than R    Shoulder Instructions       General Comments      Pertinent Vitals/ Pain       Pain Assessment Pain Assessment: Faces Faces Pain Scale: No hurt Pain Intervention(s): Limited activity within patient's tolerance  Home Living                                          Prior Functioning/Environment              Frequency  Min 2X/week        Progress Toward Goals  OT Goals(current goals can now be found in the care plan section)  Progress towards OT goals: Not progressing toward goals - comment;OT to reassess next treatment (level of arousal)  Acute Rehab OT Goals Patient Stated Goal: pt unable to state OT Goal Formulation: Patient unable to participate in goal setting Time For Goal Achievement: 12/04/21 Potential to Achieve Goals: Fair ADL Goals Pt Will Perform Grooming: with min assist;sitting Pt Will Perform Upper Body Bathing: with min assist;sitting Pt Will Transfer to Toilet: with +2 assist;with min assist;stand pivot transfer;bedside commode Additional ADL Goal #1: pt will complete basic transfer total +2 min (A) as precursor to adls  Plan Discharge plan needs to be updated    Co-evaluation    PT/OT/SLP Co-Evaluation/Treatment: Yes Reason for Co-Treatment: Complexity of the patient's impairments (multi-system involvement);For patient/therapist safety;To address functional/ADL transfers PT goals addressed during session: Mobility/safety with mobility OT goals addressed during session: ADL's and self-care;Strengthening/ROM      AM-PAC OT "6 Clicks" Daily Activity      Outcome Measure   Help from another person eating meals?: Total Help from another person taking care of personal grooming?: Total Help from another person toileting, which includes using toliet, bedpan, or urinal?: Total Help from another person bathing (including washing, rinsing, drying)?: Total Help from another person to put on and taking off regular upper body clothing?: Total Help from another person to put on and taking off regular lower body clothing?: Total 6 Click Score: 6    End of Session Equipment Utilized During Treatment: Gait belt  OT Visit Diagnosis: Unsteadiness on feet (R26.81);Muscle weakness (generalized) (M62.81);Hemiplegia and hemiparesis;Low vision, both eyes (H54.2);Other symptoms and signs involving cognitive function;Other symptoms and signs involving the nervous system (R29.898) Hemiplegia - Right/Left: Right Hemiplegia - dominant/non-dominant: Dominant Hemiplegia - caused by: Other Nontraumatic intracranial hemorrhage   Activity Tolerance Patient tolerated treatment well   Patient Left in bed;with call bell/phone within reach;with bed alarm set;with restraints reapplied   Nurse Communication Mobility status        Time: 8811-0315 OT Time Calculation (min): 28 min  Charges: OT General Charges $OT Visit: 1 Visit OT Treatments $Self Care/Home Management : 8-22 mins  Luisa Dago, OT/L   Acute OT Clinical Specialist Acute Rehabilitation Services Pager [phone] Office [phone]   [facility] 11/23/2021, 3:21 PM

## 2021-11-23 NOTE — Progress Notes (Signed)
Physical Therapy Treatment Patient Details Name: Brent Warren MRN: [id number] DOB: 1960/08/19 Today's Date: 11/23/2021   History of Present Illness 61 yo male presenting 11/11 with R-sided weakness, facial droop and slurred speech. 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 PMH includes: HTN, COPD, and alcohol abuse.    PT Comments    Pt is progressing slowly, limited by R sided weakness in general, but significant dysfunction in the R UE.  Emphasis on transition to sitting, scooting to EOB, sitting balance and sit to standing trials.    Recommendations for follow up therapy are one component of a multi-disciplinary discharge planning process, led by the attending physician.  Recommendations may be updated based on patient status, additional functional criteria and insurance authorization.  Follow Up Recommendations  Acute inpatient rehab (3hours/day)     Assistance Recommended at Discharge Frequent or constant Supervision/Assistance  Patient can return home with the following A lot of help with walking and/or transfers;A lot of help with bathing/dressing/bathroom;Assistance with cooking/housework;Direct supervision/assist for medications management;Direct supervision/assist for financial management;Assist for transportation;Help with stairs or ramp for entrance   Equipment Recommendations  Other (comment) (TBD)    Recommendations for Other Services Rehab consult     Precautions / Restrictions Precautions Precautions: Fall     Mobility  Bed Mobility Overal bed mobility: Needs Assistance       Supine to sit: Mod assist, +2 for physical assistance Sit to supine: Mod assist, +2 for physical assistance   General bed mobility comments: up toward R side with helicopter transfer.  min stability and momentum assist to scoot symmetrically to EOB    Transfers Overall transfer level: Needs assistance Equipment used: 2 person hand held assist Transfers: Sit to/from Stand, Bed to  chair/wheelchair/BSC Sit to Stand: Max assist, +2 physical assistance, Mod assist           General transfer comment: pt given extra time and knee guarded or braced to attempt mostly upright posture x2 trials.    Ambulation/Gait               General Gait Details: not appropriate due to lack of knee stability bil.   Stairs             Wheelchair Mobility    Modified Rankin (Stroke Patients Only) Modified Rankin (Stroke Patients Only) Pre-Morbid Rankin Score: No symptoms Modified Rankin: Severe disability     Balance Overall balance assessment: Needs assistance Sitting-balance support: Single extremity supported, Feet supported Sitting balance-Leahy Scale: Poor (improving toward fair.) Sitting balance - Comments: pt still reliant on L UE, biased posteriorly, but maintaining balance for secs at a time.     Standing balance-Leahy Scale: Poor Standing balance comment: reliant on external support and knee bracing/guarding to maintain balance in upright stance.                            Cognition Arousal/Alertness: Lethargic, Awake/alert Behavior During Therapy: Flat affect Overall Cognitive Status: Difficult to assess (NT formally)                                          Exercises Other Exercises Other Exercises: warm up hip/knee flex/ext ROM with graded assist R LE and also graded resistance L LE x 10 reps bil.    General Comments        Pertinent  Vitals/Pain Pain Assessment Pain Assessment: Faces Faces Pain Scale: No hurt Pain Intervention(s): Monitored during session    Home Living                          Prior Function            PT Goals (current goals can now be found in the care plan section) Acute Rehab PT Goals PT Goal Formulation: With patient Time For Goal Achievement: 12/03/21 Potential to Achieve Goals: Good Progress towards PT goals: Progressing toward goals    Frequency    Min  3X/week      PT Plan Current plan remains appropriate    Co-evaluation PT/OT/SLP Co-Evaluation/Treatment: Yes Reason for Co-Treatment: For patient/therapist safety;To address functional/ADL transfers PT goals addressed during session: Mobility/safety with mobility        AM-PAC PT "6 Clicks" Mobility   Outcome Measure  Help needed turning from your back to your side while in a flat bed without using bedrails?: A Lot Help needed moving from lying on your back to sitting on the side of a flat bed without using bedrails?: A Lot Help needed moving to and from a bed to a chair (including a wheelchair)?: Total Help needed standing up from a chair using your arms (e.g., wheelchair or bedside chair)?: Total Help needed to walk in hospital room?: Total Help needed climbing 3-5 steps with a railing? : Total 6 Click Score: 8    End of Session Equipment Utilized During Treatment: Gait belt Activity Tolerance: Patient tolerated treatment well Patient left: in bed;with call bell/phone within reach;with bed alarm set;with SCD's reapplied Nurse Communication: Mobility status PT Visit Diagnosis: Other abnormalities of gait and mobility (R26.89);Muscle weakness (generalized) (M62.81);Hemiplegia and hemiparesis Hemiplegia - Right/Left: Right Hemiplegia - dominant/non-dominant: Dominant Hemiplegia - caused by: Nontraumatic intracerebral hemorrhage     Time: [id number] PT Time Calculation (min) (ACUTE ONLY): 28 min  Charges:  $Therapeutic Activity: 8-22 mins                     11/23/2021  Ginger Carne., PT Acute Rehabilitation Services [phone]  (office)   Tessie Fass Daimon Kean 11/23/2021, 2:52 PM

## 2021-11-24 ENCOUNTER — Inpatient Hospital Stay (HOSPITAL_COMMUNITY): Payer: Medicaid Other

## 2021-11-24 DIAGNOSIS — F10931 Alcohol use, unspecified with withdrawal delirium: Secondary | ICD-10-CM | POA: Diagnosis not present

## 2021-11-24 DIAGNOSIS — I619 Nontraumatic intracerebral hemorrhage, unspecified: Secondary | ICD-10-CM | POA: Diagnosis not present

## 2021-11-24 DIAGNOSIS — F17200 Nicotine dependence, unspecified, uncomplicated: Secondary | ICD-10-CM | POA: Diagnosis not present

## 2021-11-24 DIAGNOSIS — R509 Fever, unspecified: Secondary | ICD-10-CM | POA: Diagnosis not present

## 2021-11-24 DIAGNOSIS — R1310 Dysphagia, unspecified: Secondary | ICD-10-CM | POA: Diagnosis not present

## 2021-11-24 DIAGNOSIS — I10 Essential (primary) hypertension: Secondary | ICD-10-CM | POA: Diagnosis not present

## 2021-11-24 DIAGNOSIS — I610 Nontraumatic intracerebral hemorrhage in hemisphere, subcortical: Secondary | ICD-10-CM | POA: Diagnosis not present

## 2021-11-24 LAB — CBC
HCT: 38.6 % — ABNORMAL LOW (ref 39.0–52.0)
Hemoglobin: 13.5 g/dL (ref 13.0–17.0)
MCH: 36.4 pg — ABNORMAL HIGH (ref 26.0–34.0)
MCHC: 35 g/dL (ref 30.0–36.0)
MCV: 104 fL — ABNORMAL HIGH (ref 80.0–100.0)
Platelets: 219 10*3/uL (ref 150–400)
RBC: 3.71 MIL/uL — ABNORMAL LOW (ref 4.22–5.81)
RDW: 12.8 % (ref 11.5–15.5)
WBC: 9.5 10*3/uL (ref 4.0–10.5)
nRBC: 0 % (ref 0.0–0.2)

## 2021-11-24 LAB — URINALYSIS, ROUTINE W REFLEX MICROSCOPIC
Bilirubin Urine: NEGATIVE
Glucose, UA: NEGATIVE mg/dL
Ketones, ur: NEGATIVE mg/dL
Nitrite: POSITIVE — AB
Protein, ur: 30 mg/dL — AB
Specific Gravity, Urine: 1.019 (ref 1.005–1.030)
pH: 5 (ref 5.0–8.0)

## 2021-11-24 LAB — GLUCOSE, CAPILLARY
Glucose-Capillary: 117 mg/dL — ABNORMAL HIGH (ref 70–99)
Glucose-Capillary: 122 mg/dL — ABNORMAL HIGH (ref 70–99)
Glucose-Capillary: 123 mg/dL — ABNORMAL HIGH (ref 70–99)
Glucose-Capillary: 129 mg/dL — ABNORMAL HIGH (ref 70–99)
Glucose-Capillary: 131 mg/dL — ABNORMAL HIGH (ref 70–99)
Glucose-Capillary: 131 mg/dL — ABNORMAL HIGH (ref 70–99)

## 2021-11-24 LAB — BASIC METABOLIC PANEL
Anion gap: 8 (ref 5–15)
BUN: 14 mg/dL (ref 8–23)
CO2: 20 mmol/L — ABNORMAL LOW (ref 22–32)
Calcium: 9.3 mg/dL (ref 8.9–10.3)
Chloride: 113 mmol/L — ABNORMAL HIGH (ref 98–111)
Creatinine, Ser: 0.76 mg/dL (ref 0.61–1.24)
GFR, Estimated: >60 mL/min (ref >60)
Glucose, Bld: 117 mg/dL — ABNORMAL HIGH (ref 70–99)
Potassium: 4.1 mmol/L (ref 3.5–5.1)
Sodium: 141 mmol/L (ref 135–145)

## 2021-11-24 LAB — PHOSPHORUS: Phosphorus: 3.4 mg/dL (ref 2.5–4.6)

## 2021-11-24 LAB — MAGNESIUM: Magnesium: 2.1 mg/dL (ref 1.7–2.4)

## 2021-11-24 MED ORDER — LABETALOL HCL 5 MG/ML IV SOLN
5.0000 mg | INTRAVENOUS | Status: DC | PRN
  Administered 2021-11-24 – 2021-11-26 (×3): 5 mg via INTRAVENOUS
  Filled 2021-11-24 (×3): qty 4

## 2021-11-24 MED ORDER — SODIUM CHLORIDE 0.9 % IV SOLN
3.0000 g | Freq: Four times a day (QID) | INTRAVENOUS | Status: AC
  Administered 2021-11-24 – 2021-11-28 (×18): 3 g via INTRAVENOUS
  Filled 2021-11-24 (×18): qty 8

## 2021-11-24 MED ORDER — HYDROCORTISONE 1 % EX CREA
TOPICAL_CREAM | Freq: Two times a day (BID) | CUTANEOUS | Status: DC
  Administered 2021-11-27 – 2021-12-19 (×7): 1 via TOPICAL
  Filled 2021-11-24 (×2): qty 28

## 2021-11-24 NOTE — Progress Notes (Addendum)
PROGRESS NOTE  Brent Warren ZOX:[id number] DOB: Sep 20, 1960 DOA: 11/18/2021 PCP: Marcine Matar, MD   LOS: 6 days   Brief Narrative / Interim history: 60 year old male with history of HTN, tobacco use, EtOH use who comes into the hospital with sudden onset slurred speech and right-sided weakness.  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  He was admitted to the neuro ICU, remained stable and transferred to the hospitalist service on 11/13.  He started to develop increased withdrawal symptoms on 11/13.     Significant imaging / results / micro data: 11/11, CT head-acute left basal ganglia hemorrhage with mild surrounding edema 11/11, MRI brain-signs of acute hemorrhage in the left basal ganglia without acute infarct or mass lesions 11/15- cortrack  Subjective / 24h Interval events: Opens eyes, speech difficult to understand  Assesement and Plan: Principal Problem:   ICH (intracerebral hemorrhage) (HCC) Active Problems:   Hyponatremia   HTN (hypertension)   Pulmonary emphysema (HCC)   Tobacco abuse   ETOH abuse   Alcohol withdrawal delirium (HCC)   Elevated MCV   Protein-calorie malnutrition, severe   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  Neurology consulted and following patient.  He underwent stroke work-up, 2D echo shows EF 60-65%.  LDL is 62, A1c is 5.1. -PT/OT recommends CIR -Appreciate neurology follow-up -did not pass SLP eval--  cortrack with tube feeds  Alcohol abuse, alcohol withdrawal with delirium tremens -patient with significant alcohol withdrawal symptoms, PCCM consulted and have since signed off  - on phenobarbital taper -Vitamin B1 wnl -Monitor, remains at risk for withdrawal seizures  Fever -could be withdrawal -check x ray of chest (?left sided pna-- will add unasyn for suspected aspiration) -check U/A    Elevated LFTs -due to alcohol abuse -trending down  Hypokalemia/hypomagnesemia -replete  Essential  hypertension -continue to monitor  Tobacco use, history of emphysema -resume home albuterol PRN  Hyponatremia -mild, overall stable  B12 deficiency, elevated MCV -folic acid was normal, but W11 was found to be low.   -Replete IM  Scheduled Meds:  amLODipine  10 mg Per Tube Daily   Chlorhexidine Gluconate Cloth  6 each Topical Daily   feeding supplement (PROSource TF20)  60 mL Per Tube Daily   folic acid  1 mg Per Tube Daily   heparin injection (subcutaneous)  5,000 Units Subcutaneous Q8H   multivitamin with minerals  1 tablet Per Tube Daily   mouth rinse  15 mL Mouth Rinse 4 times per day   pantoprazole (PROTONIX) IV  40 mg Intravenous QHS   PHENObarbital  65 mg Intravenous Q8H   Followed by   PHENObarbital  32.5 mg Intravenous Q8H   senna-docusate  1 tablet Per Tube BID   sodium chloride flush  3 mL Intravenous Once   thiamine  100 mg Per Tube Daily   Continuous Infusions:  sodium chloride 50 mL/hr at 11/24/21 0906   feeding supplement (PEPTAMEN 1.5 CAL) 10 mL/hr at 11/23/21 0900   PRN Meds:.acetaminophen **OR** acetaminophen (TYLENOL) oral liquid 160 mg/5 mL **OR** acetaminophen, ipratropium-albuterol, labetalol, LORazepam, mouth rinse  Current Outpatient Medications  Medication Instructions   albuterol (PROVENTIL) 2.5 mg, Nebulization, Every 6 hours PRN   albuterol (VENTOLIN HFA) 108 (90 Base) MCG/ACT inhaler 2 puffs, Inhalation, Every 6 hours PRN   amLODipine (NORVASC) 10 mg, Oral, Daily   budesonide-formoterol (SYMBICORT) 80-4.5 MCG/ACT inhaler Inhale 2 puffs into the lungs 2 (two) times daily   loperamide (IMODIUM A-D) 2 mg,  Oral, 2 times daily PRN   meloxicam (MOBIC) 15 mg, Oral, Daily    Diet Orders (From admission, onward)     Start     Ordered   11/20/21 1025  Diet NPO time specified  Diet effective now        11/20/21 1024            DVT prophylaxis: heparin injection 5,000 Units Start: 11/19/21 1400 SCD's Start: 11/18/21 1137   Lab Results   Component Value Date   PLT 219 11/24/2021      Code Status: Full Code  Family Communication: no family at bedside  Status is: Inpatient  Remains inpatient appropriate because: severity of illness  Level of care: Progressive  Consultants:  Neurology PCCM  Objective: Vitals:   11/23/21 1959 11/23/21 2352 11/24/21 0334 11/24/21 0805  BP: 137/87 (!) 147/103 (!) 151/92 (!) 154/101  Pulse: (!) 115 (!) 108 (!) 116 (!) 126  Resp: 17 16 16 14   Temp: (!) 100.8 F (38.2 C) 100.2 F (37.9 C) 98.9 F (37.2 C) 99.2 F (37.3 C)  TempSrc: Oral Oral Oral Oral  SpO2: 95% 98% 95% 94%  Weight:      Height:        Intake/Output Summary (Last 24 hours) at 11/24/2021 0947 Last data filed at 11/24/2021 0338 Gross per 24 hour  Intake 30 ml  Output 700 ml  Net -670 ml   Wt Readings from Last 3 Encounters:  11/23/21 54 kg  07/06/21 58.5 kg  02/10/21 61.7 kg    Examination:    General: Appearance:    Thin male in no acute distress     Lungs:      respirations unlabored  Heart:    Tachycardic.   MS:   All extremities are intact.   Neurologic:   Awake, alert, speech difficult to understand    Data Reviewed: I have independently reviewed following labs and imaging studies   CBC Recent Labs  Lab 11/18/21 1130 11/18/21 1134 11/19/21 0231 11/21/21 0403 11/23/21 0619 11/24/21 0404  WBC 7.5  --  5.7 7.9 9.3 9.5  HGB 13.6 15.0 13.5 15.3 14.2 13.5  HCT 38.8* 44.0 38.1* 45.7 40.3 38.6*  PLT 224  --  214 185 209 219  MCV 104.6*  --  102.1* 106.0* 103.3* 104.0*  MCH 36.7*  --  36.2* 35.5* 36.4* 36.4*  MCHC 35.1  --  35.4 33.5 35.2 35.0  RDW 13.2  --  13.2 12.9 12.7 12.8  LYMPHSABS 2.3  --   --   --   --   --   MONOABS 0.7  --   --   --   --   --   EOSABS 0.2  --   --   --   --   --   BASOSABS 0.1  --   --   --   --   --     Recent Labs  Lab 11/18/21 1130 11/18/21 1134 11/19/21 0231 11/19/21 0231 11/21/21 0403 11/22/21 1700 11/23/21 0619 11/23/21 1816  11/24/21 0404  NA 132* 131* 134*  --  134*  --  134*  --  141  K 4.2 4.3 4.3  --  4.0  --  3.0*  --  4.1  CL 96* 97* 99  --  101  --  102  --  113*  CO2 21*  --  21*  --  20*  --  19*  --  20*  GLUCOSE 93 88  99  --  95  --  96  --  117*  BUN <5* 3* 7*  --  8  --  14  --  14  CREATININE 0.59* 0.80 0.65  --  0.69  --  0.60*  --  0.76  CALCIUM 9.1  --  9.3  --  9.8  --  9.3  --  9.3  AST 76*  --  59*  --  56*  --   --   --   --   ALT 38  --  34  --  31  --   --   --   --   ALKPHOS 58  --  53  --  61  --   --   --   --   BILITOT 0.5  --  0.7  --  0.6  --   --   --   --   ALBUMIN 4.0  --  3.8  --  4.0  --   --   --   --   MG  --   --  1.7   < > 1.8 1.8 1.7 2.6* 2.1  INR 1.0  --  1.0  --   --   --   --   --   --   HGBA1C  --   --  5.1  --   --   --   --   --   --    < > = values in this interval not displayed.    ------------------------------------------------------------------------------------------------------------------ No results for input(s): "CHOL", "HDL", "LDLCALC", "TRIG", "CHOLHDL", "LDLDIRECT" in the last 72 hours.  Lab Results  Component Value Date   HGBA1C 5.1 11/19/2021   ------------------------------------------------------------------------------------------------------------------ No results for input(s): "TSH", "T4TOTAL", "T3FREE", "THYROIDAB" in the last 72 hours.  Invalid input(s): "FREET3"  Cardiac Enzymes No results for input(s): "CKMB", "TROPONINI", "MYOGLOBIN" in the last 168 hours.  Invalid input(s): "CK" ------------------------------------------------------------------------------------------------------------------ No results found for: "BNP"  CBG: Recent Labs  Lab 11/23/21 1550 11/23/21 1956 11/23/21 2347 11/24/21 0349 11/24/21 0805  GLUCAP 113* 120* 131* 122* 129*    Recent Results (from the past 240 hour(s))  MRSA Next Gen by PCR, Nasal     Status: None   Collection Time: 11/18/21  1:13 PM   Specimen: Nasal Mucosa; Nasal Swab  Result  Value Ref Range Status   MRSA by PCR Next Gen NOT DETECTED NOT DETECTED Final    Comment: (NOTE) The GeneXpert MRSA Assay (FDA approved for NASAL specimens only), is one component of a comprehensive MRSA colonization surveillance program. It is not intended to diagnose MRSA infection nor to guide or monitor treatment for MRSA infections. Test performance is not FDA approved in patients less than 78 years old. Performed at [facility], [address] [place], [place] [address]      Radiology Studies: No results found.   Marlin Canary DO [facility]  Between 7 am - 7 pm I am available, please contact me via Amion (for emergencies) or Securechat (non urgent messages)  Between 7 pm - 7 am I am not available, please contact night coverage MD/APP via Amion

## 2021-11-24 NOTE — Progress Notes (Signed)
STROKE TEAM PROGRESS NOTE   SUBJECTIVE (INTERVAL HISTORY) No family is at the bedside. Pt still drowsy sleepy but arousable with voice. Delay to follow commands and still has right hemiparesis with severe dysarthria. On phenobarbital taper. Only received one dose of ativan overnight, improved from before.   OBJECTIVE Temp:  [98.9 F (37.2 C)-100.8 F (38.2 C)] 100 F (37.8 C) (11/17 1509) Pulse Rate:  [108-126] 110 (11/17 1509) Cardiac Rhythm: Sinus tachycardia (11/17 0700) Resp:  [14-20] 14 (11/17 1509) BP: (137-154)/(87-103) 150/92 (11/17 1509) SpO2:  [94 %-99 %] 97 % (11/17 1509)  Recent Labs  Lab 11/23/21 1956 11/23/21 2347 11/24/21 0349 11/24/21 0805 11/24/21 1132  GLUCAP 120* 131* 122* 129* 131*   Recent Labs  Lab 11/18/21 1130 11/18/21 1130 11/18/21 1134 11/19/21 0231 11/21/21 0403 11/22/21 1700 11/23/21 0619 11/23/21 1816 11/24/21 0404  NA 132*  --  131* 134* 134*  --  134*  --  141  K 4.2  --  4.3 4.3 4.0  --  3.0*  --  4.1  CL 96*  --  97* 99 101  --  102  --  113*  CO2 21*  --   --  21* 20*  --  19*  --  20*  GLUCOSE 93  --  88 99 95  --  96  --  117*  BUN <5*  --  3* 7* 8  --  14  --  14  CREATININE 0.59*  --  0.80 0.65 0.69  --  0.60*  --  0.76  CALCIUM 9.1  --   --  9.3 9.8  --  9.3  --  9.3  MG  --    < >  --  1.7 1.8 1.8 1.7 2.6* 2.1  PHOS  --   --   --   --   --  2.8 3.3 3.4 3.4   < > = values in this interval not displayed.   Recent Labs  Lab 11/18/21 1130 11/19/21 0231 11/21/21 0403  AST 76* 59* 56*  ALT 38 34 31  ALKPHOS 58 53 61  BILITOT 0.5 0.7 0.6  PROT 8.0 7.7 8.3*  ALBUMIN 4.0 3.8 4.0   Recent Labs  Lab 11/18/21 1130 11/18/21 1134 11/19/21 0231 11/21/21 0403 11/23/21 0619 11/24/21 0404  WBC 7.5  --  5.7 7.9 9.3 9.5  NEUTROABS 4.3  --   --   --   --   --   HGB 13.6 15.0 13.5 15.3 14.2 13.5  HCT 38.8* 44.0 38.1* 45.7 40.3 38.6*  MCV 104.6*  --  102.1* 106.0* 103.3* 104.0*  PLT 224  --  214 185 209 219   No results for  input(s): "CKTOTAL", "CKMB", "CKMBINDEX", "TROPONINI" in the last 168 hours. No results for input(s): "LABPROT", "INR" in the last 72 hours.  Recent Labs    11/24/21 1107  COLORURINE AMBER*  LABSPEC 1.019  PHURINE 5.0  GLUCOSEU NEGATIVE  HGBUR SMALL*  BILIRUBINUR NEGATIVE  KETONESUR NEGATIVE  PROTEINUR 30*  NITRITE POSITIVE*  LEUKOCYTESUR SMALL*       Component Value Date/Time   CHOL 160 11/19/2021 0231   CHOL 153 10/10/2020 1650   TRIG 48 11/19/2021 0231   HDL 88 11/19/2021 0231   HDL 75 10/10/2020 1650   CHOLHDL 1.8 11/19/2021 0231   VLDL 10 11/19/2021 0231   LDLCALC 62 11/19/2021 0231   LDLCALC 62 10/10/2020 1650   Lab Results  Component Value Date   HGBA1C 5.1 11/19/2021  Component Value Date/Time   LABOPIA NONE DETECTED 11/22/2021 0030   COCAINSCRNUR NONE DETECTED 11/22/2021 0030   LABBENZ POSITIVE (A) 11/22/2021 0030   AMPHETMU NONE DETECTED 11/22/2021 0030   THCU POSITIVE (A) 11/22/2021 0030   LABBARB POSITIVE (A) 11/22/2021 0030    Recent Labs  Lab 11/18/21 1130  ETH 201*    I have personally reviewed the radiological images below and agree with the radiology interpretations.  DG CHEST PORT 1 VIEW  Result Date: 11/24/2021 CLINICAL DATA:  Fever EXAM: PORTABLE CHEST 1 VIEW COMPARISON:  Chest radiograph done on 06/16/2016, CT chest done on 10/20/2020 FINDINGS: Cardiac size is within normal limits. Distal portion of enteric tube is seen in the stomach. Subtle increased interstitial markings are seen in left lower lung field. There is no pleural effusion or pneumothorax. IMPRESSION: Increased interstitial markings in the left lower lung fields suggest interstitial pneumonia. Electronically Signed   By: Elmer Picker M.D.   On: 11/24/2021 12:04   EEG adult  Result Date: 11/22/2021 Lora Havens, MD     11/22/2021  9:31 PM Patient Name: Brent Warren MRN: [id number] Epilepsy Attending: Lora Havens Referring Physician/Provider: Rosalin Hawking, MD  Date: 11/22/2021 Duration: 22.28 mins Patient history: 61 year old male with left basal ganglia hemorrhage and altered mental status.  EEG to evaluate for seizure. Level of alertness: Awake AEDs during EEG study: Phenobarb Technical aspects: This EEG study was done with scalp electrodes positioned according to the 10-20 International system of electrode placement. Electrical activity was reviewed with band pass filter of 1-70Hz , sensitivity of 7 uV/mm, display speed of 53mm/sec with a 60Hz  notched filter applied as appropriate. EEG data were recorded continuously and digitally stored.  Video monitoring was available and reviewed as appropriate. Description: EEG showed continuous generalized 3 to 6 Hz theta-delta slowing admixed with an excessive amount of 15 to 18 Hz beta activity distributed symmetrically and diffusely. Hyperventilation and photic stimulation were not performed.   ABNORMALITY - Continuous slow, generalized - Excessive beta, generalized IMPRESSION: This study is suggestive of moderate to severe diffuse encephalopathy, nonspecific etiology. No seizures or epileptiform discharges were seen throughout the recording. Lora Havens   DG Abd Portable 1V  Result Date: 11/22/2021 CLINICAL DATA:  NG tube placement EXAM: PORTABLE ABDOMEN - 1 VIEW COMPARISON:  None Available. FINDINGS: Enteric tube tip projects over the stomach. No focal consolidation in the partially imaged lung bases. Non dilated bowel loops in the upper abdomen. IMPRESSION: Enteric tube tip projects over the stomach. Electronically Signed   By: Darrin Nipper M.D.   On: 11/22/2021 11:14   CT HEAD WO CONTRAST (5MM)  Result Date: 11/20/2021 CLINICAL DATA:  Altered mental status, intraparenchymal hematoma. EXAM: CT HEAD WITHOUT CONTRAST TECHNIQUE: Contiguous axial images were obtained from the base of the skull through the vertex without intravenous contrast. RADIATION DOSE REDUCTION: This exam was performed according to the  departmental dose-optimization program which includes automated exposure control, adjustment of the mA and/or kV according to patient size and/or use of iterative reconstruction technique. COMPARISON:  CT head 2 days prior. FINDINGS: Brain: The 2.2 cm x 1.7 cm x 1.7 cm intraparenchymal hematoma centered in the left basal ganglia is unchanged in size and appearance. There is mild surrounding edema without mass effect or midline shift. There is no new acute intracranial hemorrhage or extra-axial fluid collection. There is no acute territorial infarct. Parenchymal volume is stable. The ventricles are stable in size. There is no mass lesion. Vascular: No hyperdense vessel  or unexpected calcification. Skull: Normal. Negative for fracture or focal lesion. Sinuses/Orbits: The imaged paranasal sinuses are clear. The globes and orbits are unremarkable. Other: None. IMPRESSION: Unchanged intraparenchymal hematoma centered in the left basal ganglia. No new acute intracranial pathology. Electronically Signed   By: Valetta Mole M.D.   On: 11/20/2021 15:31   ECHOCARDIOGRAM COMPLETE  Result Date: 11/19/2021    ECHOCARDIOGRAM REPORT   Patient Name:   Brent Warren Date of Exam: 11/19/2021 Medical Rec #:  [id number]   Height:       69.0 in Accession #:    [id number]  Weight:       130.5 lb Date of Birth:  03-09-1960    BSA:          1.723 m Patient Age:    47 years    BP:           116/100 mmHg Patient Gender: M           HR:           66 bpm. Exam Location:  Inpatient Procedure: 2D Echo, Cardiac Doppler, Color Doppler and Intracardiac            Opacification Agent Indications:    Stroke  History:        Patient has prior history of Echocardiogram examinations, most                 recent 05/02/2017. Risk Factors:Hypertension and Current Smoker.                 ETOH abuse.  Sonographer:    Clayton Lefort RDCS (AE) Referring Phys: [facility]  Sonographer Comments: Technically difficult study due to poor echo windows, suboptimal  parasternal window, suboptimal apical window and suboptimal subcostal window. IMPRESSIONS  1. Left ventricular ejection fraction, by estimation, is 60 to 65%. The left ventricle has normal function. The left ventricle has no regional wall motion abnormalities. Left ventricular diastolic parameters are consistent with Grade I diastolic dysfunction (impaired relaxation).  2. Right ventricular systolic function is normal. The right ventricular size is normal. Tricuspid regurgitation signal is inadequate for assessing PA pressure.  3. The mitral valve is grossly normal. No evidence of mitral valve regurgitation.  4. The aortic valve was not well visualized. Aortic valve regurgitation is not visualized.  5. The inferior vena cava is normal in size with greater than 50% respiratory variability, suggesting right atrial pressure of 3 mmHg. Comparison(s): Changes from prior study are noted. 05/02/2017 (stress echo) - negative for ischemia, LVEF 50-55% at baseline. FINDINGS  Left Ventricle: Left ventricular ejection fraction, by estimation, is 60 to 65%. The left ventricle has normal function. The left ventricle has no regional wall motion abnormalities. Definity contrast agent was given IV to delineate the left ventricular  endocardial borders. The left ventricular internal cavity size was normal in size. There is no left ventricular hypertrophy. Left ventricular diastolic parameters are consistent with Grade I diastolic dysfunction (impaired relaxation). Indeterminate filling pressures. Right Ventricle: The right ventricular size is normal. No increase in right ventricular wall thickness. Right ventricular systolic function is normal. Tricuspid regurgitation signal is inadequate for assessing PA pressure. Left Atrium: Left atrial size was normal in size. Right Atrium: Right atrial size was normal in size. Pericardium: There is no evidence of pericardial effusion. Mitral Valve: The mitral valve is grossly normal. No evidence  of mitral valve regurgitation. Tricuspid Valve: The tricuspid valve is not well visualized. Tricuspid valve regurgitation is not demonstrated.  Aortic Valve: The aortic valve was not well visualized. Aortic valve regurgitation is not visualized. Aortic valve mean gradient measures 2.0 mmHg. Aortic valve peak gradient measures 4.4 mmHg. Pulmonic Valve: The pulmonic valve was not well visualized. Pulmonic valve regurgitation is not visualized. Aorta: The aortic root and ascending aorta are structurally normal, with no evidence of dilitation. Venous: The inferior vena cava is normal in size with greater than 50% respiratory variability, suggesting right atrial pressure of 3 mmHg. IAS/Shunts: The interatrial septum appears to be lipomatous. No atrial level shunt detected by color flow Doppler.   Diastology LV e' medial:    4.35 cm/s LV E/e' medial:  12.6 LV e' lateral:   7.51 cm/s LV E/e' lateral: 7.3  RIGHT VENTRICLE             IVC RV Basal diam:  3.30 cm     IVC diam: 1.40 cm RV S prime:     13.50 cm/s TAPSE (M-mode): 1.5 cm LEFT ATRIUM           Index       RIGHT ATRIUM           Index LA Vol (A4C): 15.8 ml 9.17 ml/m  RA Area:     13.20 cm                                   RA Volume:   34.30 ml  19.90 ml/m  AORTIC VALVE AV Vmax:           105.00 cm/s AV Vmean:          69.800 cm/s AV VTI:            0.198 m AV Peak Grad:      4.4 mmHg AV Mean Grad:      2.0 mmHg LVOT Vmax:         92.50 cm/s LVOT Vmean:        56.200 cm/s LVOT VTI:          0.157 m LVOT/AV VTI ratio: 0.79 MITRAL VALVE MV Area (PHT): 2.99 cm    SHUNTS MV Decel Time: 254 msec    Systemic VTI: 0.16 m MV E velocity: 54.80 cm/s MV A velocity: 74.80 cm/s MV E/A ratio:  0.73 Zoila Shutter MD Electronically signed by Zoila Shutter MD Signature Date/Time: 11/19/2021/2:04:56 PM    Final    CT HEAD WO CONTRAST  Result Date: 11/18/2021 CLINICAL DATA:  Hemorrhagic stroke. EXAM: CT HEAD WITHOUT CONTRAST TECHNIQUE: Contiguous axial images were obtained  from the base of the skull through the vertex without intravenous contrast. RADIATION DOSE REDUCTION: This exam was performed according to the departmental dose-optimization program which includes automated exposure control, adjustment of the mA and/or kV according to patient size and/or use of iterative reconstruction technique. COMPARISON:  CT angiography dated 11/18/2021. FINDINGS: Brain: No significant interval change in the left basal ganglia hemorrhage. No new hemorrhage. There is mild age-related atrophy and chronic microvascular ischemic changes. No mass effect or midline shift. No extra-axial fluid collection. Vascular: No hyperdense vessel or unexpected calcification. Skull: Normal. Negative for fracture or focal lesion. Sinuses/Orbits: The visualized paranasal sinuses and mastoid air cells are clear. There is deviation of the nose to the left. Cerumen noted in the external auditory canals bilaterally. Other: None IMPRESSION: 1. No significant interval change in the left basal ganglia hemorrhage. No new hemorrhage. 2. Mild age-related atrophy and chronic  microvascular ischemic changes. Electronically Signed   By: Anner Crete M.D.   On: 11/18/2021 19:34   MR BRAIN W WO CONTRAST  Result Date: 11/18/2021 CLINICAL DATA:  Neuro deficit, acute, stroke suspected EXAM: MRI HEAD WITHOUT AND WITH CONTRAST TECHNIQUE: Multiplanar, multiecho pulse sequences of the brain and surrounding structures were obtained without and with intravenous contrast. CONTRAST:  46mL GADAVIST GADOBUTROL 1 MMOL/ML IV SOLN COMPARISON:  CT head from the same day. FINDINGS: Brain: When comparing across modalities, similar size of acute hemorrhage in the left basal ganglia. Mild surrounding edema. No significant mass effect. No midline shift. No surrounding restricted diffusion to suggest acute fracture. No visible mass lesion; however, acute blood products limit assessment. Patchy T2/FLAIR hyperintensity in the white matter,  nonspecific but compatible with chronic microvascular disease. Cerebral atrophy. No hydrocephalus. No pathologic enhancement. Vascular: Major arterial flow voids are maintained skull base. Skull and upper cervical spine: Normal marrow signal. Sinuses/Orbits: Clear sinuses.  No acute orbital findings. Other: No mastoid effusions. IMPRESSION: When comparing across modalities, similar size of acute hemorrhage in the left basal ganglia. No visible acute infarct or mass lesion; however, acute blood products limits assessment. Electronically Signed   By: Margaretha Sheffield M.D.   On: 11/18/2021 14:50   CT ANGIO HEAD NECK W WO CM (CODE STROKE)  Result Date: 11/18/2021 CLINICAL DATA:  Code stroke. 61 year male with weakness. Acute left basal ganglia hemorrhage on plain CT. EXAM: CT ANGIOGRAPHY HEAD AND NECK TECHNIQUE: Multidetector CT imaging of the head and neck was performed using the standard protocol during bolus administration of intravenous contrast. Multiplanar CT image reconstructions and MIPs were obtained to evaluate the vascular anatomy. Carotid stenosis measurements (when applicable) are obtained utilizing NASCET criteria, using the distal internal carotid diameter as the denominator. RADIATION DOSE REDUCTION: This exam was performed according to the departmental dose-optimization program which includes automated exposure control, adjustment of the mA and/or kV according to patient size and/or use of iterative reconstruction technique. CONTRAST:  38mL OMNIPAQUE IOHEXOL 350 MG/ML SOLN COMPARISON:  Plain head CT 1132 hours today. FINDINGS: CTA NECK Skeleton: Cervical spine degeneration with developing degenerative appearing upper cervical multilevel posterior element ankylosis. No acute or suspicious osseous lesion identified. Upper chest: Centrilobular and paraseptal emphysema. Negative visible superior mediastinum. Other neck: Chronic nasal bone fractures. No acute finding identified. Aortic arch: 3 vessel  arch configuration. Mild Calcified aortic atherosclerosis. Right carotid system: Brachiocephalic artery and proximal right CCA are negative. Minimal plaque in the distal right CCA and at the bifurcation primarily affecting the ECA. No stenosis. Left carotid system: Intermittent mild left CCA plaque before the bifurcation without stenosis. Mild calcified plaque at the bifurcation more affecting the ECA. No stenosis. Vertebral arteries: Proximal right subclavian artery and right vertebral artery origin plaque with only mild vertebral origin stenosis (series 8, image 103). Right vertebral appears somewhat non dominant but patent to the skull base without additional stenosis. Proximal left subclavian artery plaque with no significant stenosis. Mild plaque at the left vertebral artery origin with mild if any stenosis. Mildly dominant left vertebral artery is patent to the skull base with no additional stenosis. CTA HEAD Posterior circulation: Dominant left V4 segment especially distal to the patent right PICA origin. Normal left PICA. Mild left V4 calcified plaque. No significant distal vertebral or vertebrobasilar junction stenosis. Patent basilar artery without stenosis. Patent SCA and PCA origins. Right PCA branches are within normal limits. Left PCA mild P2 segment irregularity with no significant stenosis. Anterior circulation:  Both ICA siphons are patent. Mild, mostly supraclinoid calcified plaque bilaterally. Mild supraclinoid ICA stenosis more pronounced on the right. Patent carotid termini. Patent MCA and ACA origins. Anterior communicating artery and ACA branches are within normal limits. The left A2 appears to be dominant. Right MCA M1 segment and bifurcation are patent without stenosis. Right MCA branches are within normal limits. No left hemisphere CTA spot sign. Left MCA M1 segment is tortuous without stenosis. Patent left MCA bifurcation without stenosis. Left MCA branches are patent with some mild M2 and  M3 irregularity in the middle sylvian division. Venous sinuses: Early contrast timing, not well evaluated. Anatomic variants: Dominant left vertebral artery. Dominant left ACA A2. Review of the MIP images confirms the above findings IMPRESSION: 1. No CTA spot sign in association with left basal ganglia hemorrhage. Negative for large vessel occlusion. 2. Generally mild atherosclerosis in the head and neck. No hemodynamically significant stenosis. 3. Cervical spine degeneration. Aortic Atherosclerosis (ICD10-I70.0) and Emphysema (ICD10-J43.9). Salient findings were communicated to Dr. Lorrin Goodell at 12:05 pm on 11/18/2021 by text page via the [facility] messaging system. Electronically Signed   By: Genevie Ann M.D.   On: 11/18/2021 12:05   CT HEAD CODE STROKE WO CONTRAST  Result Date: 11/18/2021 CLINICAL DATA:  Code stroke.  60 year male with weakness. EXAM: CT HEAD WITHOUT CONTRAST TECHNIQUE: Contiguous axial images were obtained from the base of the skull through the vertex without intravenous contrast. RADIATION DOSE REDUCTION: This exam was performed according to the departmental dose-optimization program which includes automated exposure control, adjustment of the mA and/or kV according to patient size and/or use of iterative reconstruction technique. COMPARISON:  Head CT 08/24/2010. FINDINGS: Brain: Globular hyperdense hemorrhage at the left basal ganglia, centered at the lentiform encompasses 19 x 22 x 21 mm (AP by transverse by CC), estimated volume 4 mL. Mild surrounding edema. No significant mass effect. No intraventricular or extra-axial extension of blood. Generalized cerebral volume loss since 2012 seems advanced for age. No disproportionate ventriculomegaly. No midline shift. Patchy additional bilateral cerebral white matter hypodensity. No superimposed cortically based acute infarct identified. Vascular: Calcified atherosclerosis at the skull base. No suspicious intracranial vascular hyperdensity. Skull: No  acute osseous abnormality identified. Sinuses/Orbits: Visualized paranasal sinuses and mastoids are clear. Other: Visualized orbits and scalp soft tissues are within normal limits. ASPECTS [facility] Stroke Program Early CT Score) Total score (0-10 with 10 being normal): Not applicable, acute hemorrhage Study discussed by telephone with Dr. Lorrin Goodell on 11/18/2021 at 11:46 . IMPRESSION: 1. 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 Mild surrounding edema. No significant mass effect. This was briefly discussed by telephone with Dr. Lorrin Goodell on 11/18/2021 at 11:46. 2. Evidence of underlying chronic small vessel disease. Generalized brain volume loss since 2012 seems advanced for age. 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 Electronically Signed   By: Genevie Ann M.D.   On: 11/18/2021 11:49     PHYSICAL EXAM  Temp:  [98.9 F (37.2 C)-100.8 F (38.2 C)] 100 F (37.8 C) (11/17 1509) Pulse Rate:  [108-126] 110 (11/17 1509) Resp:  [14-20] 14 (11/17 1509) BP: (137-154)/(87-103) 150/92 (11/17 1509) SpO2:  [94 %-99 %] 97 % (11/17 1509)  General - thin built, well developed, drowsy sleepy.  Ophthalmologic - fundi not visualized due to noncooperation.  Cardiovascular - Regular rhythm and rate.   Neuro - drowsy sleepy, eyes open with repetitive stimulation, mumbles to answer orientation questions, speech incomprehensible. No gaze palsy but nystagmus on right gaze, eyes left gaze preference, tracking  bilaterally, blinking to visual threat bilaterally, PERRL. Right facial droop. Tongue protrusion not cooperative. LUE 3/5 and LLE 3/5 at least, RUE 1/5 and RLE 2/5 but wiggles toes to command. Sensation, coordination and gait not tested.   ASSESSMENT/PLAN Brent Warren is a 61 y.o. male with history of hypertension, smoker, heavy alcoholic admitted for right-sided numbness and weakness, right facial droop, slurred speech. No tPA given due to [place].    ICH:  left BG ICH, likely due to small vessel disease  given multiple uncontrolled risk factors CT showed left BG small ICH CTA head and neck unremarkable, no aneurysm or AVM MRI left BG small ICH CT repeat stable hematoma x 2 2D Echo EF 60 to 65% LDL 62 HgbA1c 5.1 UDS positive for THC Heparin subcu for VTE prophylaxis No antithrombotic prior to admission, now on No antithrombotic given ICH Ongoing aggressive stroke risk factor management Therapy recommendations: CIR Disposition: Pending  Hypertension Stable Labetalol IV PRN On amlodipine 10 Long term BP goal normotensive  Tobacco abuse Current smoker Smoking cessation counseling provided Pt is willing to quit  Alcohol withdrawl Delirium tremens Heavy alcohol user, 6-10 beers every day On CIWA protocol Thiamine/folic acid/multivitamin Limitation of alcohol use education provided On phenobarbital taper EEG moderate to severe diffuse encephalopathy   B12 deficiency B12 = 177 MCV and MCH elevated On B12 supplement  Dysphagia  Did not pass swallow On IVF @ 50->25 Had cortrak and TF increased to 55 cc/h  Other Stroke Risk Factors   Other [facility] day # 6  Neurology will sign off. Please call with questions. Pt will follow up with stroke clinic NP at [facility] in about 4 weeks. Thanks for the consult.   Rosalin Hawking, MD PhD Stroke Neurology 11/24/2021 4:07 PM     To contact Stroke Continuity provider, please refer to [email or web address] After hours, contact General Neurology

## 2021-11-24 NOTE — Progress Notes (Signed)
Pharmacy Antibiotic Note  Brent Warren is a 61 y.o. male admitted on 11/18/2021 with pneumonia.  Pharmacy has been consulted for unasyn dosing.  Plan: Unasyn 3 gram iv q6h  Height: 5\' 9"  (175.3 cm) Weight: 54 kg (119 lb 0.8 oz) IBW/kg (Calculated) : 70.7  Temp (24hrs), Avg:99.6 F (37.6 C), Min:98.9 F (37.2 C), Max:100.8 F (38.2 C)  Recent Labs  Lab 11/18/21 1130 11/18/21 1134 11/19/21 0231 11/21/21 0403 11/23/21 0619 11/24/21 0404  WBC 7.5  --  5.7 7.9 9.3 9.5  CREATININE 0.59* 0.80 0.65 0.69 0.60* 0.76    Estimated Creatinine Clearance: 73.1 mL/min (by C-G formula based on SCr of 0.76 mg/dL).    No Known Allergies  Antimicrobials this admission: unasyn 11/17 >>     Microbiology results: 11/11 MRSA PCR: unremarkable  Thank you for allowing pharmacy to be a part of this patient's care.  13/11 BS, PharmD, BCPS Clinical Pharmacist 11/24/2021 2:38 PM  Contact: [phone] after 3 PM  "Be curious, not judgmental..." [phone]

## 2021-11-25 DIAGNOSIS — F1010 Alcohol abuse, uncomplicated: Secondary | ICD-10-CM | POA: Diagnosis not present

## 2021-11-25 DIAGNOSIS — F10931 Alcohol use, unspecified with withdrawal delirium: Secondary | ICD-10-CM | POA: Diagnosis not present

## 2021-11-25 DIAGNOSIS — E43 Unspecified severe protein-calorie malnutrition: Secondary | ICD-10-CM | POA: Diagnosis not present

## 2021-11-25 DIAGNOSIS — I610 Nontraumatic intracerebral hemorrhage in hemisphere, subcortical: Secondary | ICD-10-CM | POA: Diagnosis not present

## 2021-11-25 DIAGNOSIS — E871 Hypo-osmolality and hyponatremia: Secondary | ICD-10-CM

## 2021-11-25 LAB — GLUCOSE, CAPILLARY
Glucose-Capillary: 124 mg/dL — ABNORMAL HIGH (ref 70–99)
Glucose-Capillary: 130 mg/dL — ABNORMAL HIGH (ref 70–99)
Glucose-Capillary: 122 mg/dL — ABNORMAL HIGH (ref 70–99)
Glucose-Capillary: 107 mg/dL — ABNORMAL HIGH (ref 70–99)
Glucose-Capillary: 107 mg/dL — ABNORMAL HIGH (ref 70–99)
Glucose-Capillary: 104 mg/dL — ABNORMAL HIGH (ref 70–99)

## 2021-11-25 LAB — BASIC METABOLIC PANEL
Anion gap: 12 (ref 5–15)
BUN: 14 mg/dL (ref 8–23)
CO2: 20 mmol/L — ABNORMAL LOW (ref 22–32)
Calcium: 9.1 mg/dL (ref 8.9–10.3)
Chloride: 110 mmol/L (ref 98–111)
Creatinine, Ser: 0.8 mg/dL (ref 0.61–1.24)
GFR, Estimated: >60 mL/min (ref >60)
Glucose, Bld: 122 mg/dL — ABNORMAL HIGH (ref 70–99)
Potassium: 3.3 mmol/L — ABNORMAL LOW (ref 3.5–5.1)
Sodium: 142 mmol/L (ref 135–145)

## 2021-11-25 LAB — CBC
HCT: 33.9 % — ABNORMAL LOW (ref 39.0–52.0)
Hemoglobin: 12 g/dL — ABNORMAL LOW (ref 13.0–17.0)
MCH: 36.8 pg — ABNORMAL HIGH (ref 26.0–34.0)
MCHC: 35.4 g/dL (ref 30.0–36.0)
MCV: 104 fL — ABNORMAL HIGH (ref 80.0–100.0)
Platelets: 232 10*3/uL (ref 150–400)
RBC: 3.26 MIL/uL — ABNORMAL LOW (ref 4.22–5.81)
RDW: 13 % (ref 11.5–15.5)
WBC: 8.6 10*3/uL (ref 4.0–10.5)
nRBC: 0 % (ref 0.0–0.2)

## 2021-11-25 MED ORDER — POTASSIUM CHLORIDE 20 MEQ PO PACK
40.0000 meq | PACK | Freq: Once | ORAL | Status: AC
  Administered 2021-11-25: 40 meq
  Filled 2021-11-25: qty 2

## 2021-11-25 MED ORDER — POTASSIUM CHLORIDE CRYS ER 20 MEQ PO TBCR
40.0000 meq | EXTENDED_RELEASE_TABLET | Freq: Once | ORAL | Status: DC
  Filled 2021-11-25: qty 2

## 2021-11-25 MED ORDER — VITAMIN B-12 1000 MCG PO TABS
1000.0000 ug | ORAL_TABLET | Freq: Every day | ORAL | Status: DC
  Administered 2021-11-25 – 2021-12-12 (×18): 1000 ug
  Filled 2021-11-25 (×17): qty 1

## 2021-11-25 NOTE — Plan of Care (Signed)
  Problem: Education: Goal: Knowledge of disease or condition will improve Outcome: Progressing Goal: Knowledge of secondary prevention will improve (MUST DOCUMENT ALL) Outcome: Progressing Goal: Knowledge of patient specific risk factors will improve Loraine Leriche N/A or DELETE if not current risk factor) Outcome: Progressing   Problem: Self-Care: Goal: Ability to participate in self-care as condition permits will improve Outcome: Progressing Goal: Verbalization of feelings and concerns over difficulty with self-care will improve Outcome: Progressing Goal: Ability to communicate needs accurately will improve Outcome: Progressing   Problem: Nutrition: Goal: Risk of aspiration will decrease Outcome: Progressing Goal: Dietary intake will improve Outcome: Progressing   Problem: Safety: Goal: Non-violent Restraint(s) Outcome: Progressing

## 2021-11-25 NOTE — Progress Notes (Addendum)
PROGRESS NOTE    Brent Warren  WGN:[id number] DOB: 05-29-1960 DOA: 11/18/2021 PCP: Marcine Matar, MD   Brief Narrative:  61 year old male with history of HTN, tobacco use, chronic alcohol use presented with sudden onset slurred speech and right-sided weakness.  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  He was admitted to the neuro ICU, remained stable and transferred to the hospitalist service on 11/13.  He started to develop increased alcohol withdrawal symptoms on 11/13.     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  LDL 62, A1c is 5.1 -PT/OT recommending CIR.  As per CIR, patient is not a good candidate for CIR.  TOC consulted for possible SNF placement -SLP following.  Currently NPO.  Continue cortrak (placed on 11/22/2021) feeding  Alcohol abuse, alcohol withdrawal with delirium tremens -Patient with significant alcohol withdrawal symptoms.  PCCM consulted and have signed off.  Currently on phenobarbital taper -Vitamin B1 normal -Continue thiamine, folic acid and multivitamin  Hypertension -Monitor blood pressure.  Continue amlodipine  Hypokalemia -Replace.  Repeat a.m. labs  Possible aspiration pneumonia Fever -Temperature max of 100.1 over the last 24 hours.  Currently on Unasyn, started on 11/24/2021: Finished 5-day course of therapy.  Chest x-ray on 11/24/2021 suggested possible interstitial pneumonia. -UA suggestive of UTI as well.  Check urine culture and blood culture as well  Vitamin B12 deficiency -B12 177.  Folate 12.7.  Supplement B12.  Hyponatremia -Resolved  Macrocytosis -Possibly from alcohol abuse  Elevated LFTs -Possibly from alcohol abuse.  Improving.  Monitor intermittently.  Tobacco use, history of emphysema -Continue nebs as  needed     DVT prophylaxis: Heparin subcutaneous Code Status: Full Family Communication: None at bedside Disposition Plan: Status is: Inpatient Remains inpatient appropriate because: Of severity of illness  Consultants: Neurology/PCCM  Procedures: As above  Antimicrobials: Unasyn from 11/24/2021 onwards   Subjective: Patient seen and examined at bedside.  Awake but poor historian and confused.  Speech still very difficult to understand.  No seizures, vomiting, agitation reported.  Objective: Vitals:   11/25/21 0111 11/25/21 0357 11/25/21 0446 11/25/21 0854  BP: (!) 132/95 118/83  (!) 167/88  Pulse: 89 (!) 109  88  Resp:  20  16  Temp:  99.4 F (37.4 C)  98.1 F (36.7 C)  TempSrc:  Oral  Axillary  SpO2:  97%  97%  Weight:   56.6 kg   Height:        Intake/Output Summary (Last 24 hours) at 11/25/2021 1018 Last data filed at 11/25/2021 0800 Gross per 24 hour  Intake 1876.55 ml  Output 800 ml  Net 1076.55 ml   Filed Weights   11/18/21 1100 11/23/21 0600 11/25/21 0446  Weight: 59.2 kg 54 kg 56.6 kg    Examination:  General exam: Appears calm and comfortable.  Looks chronically ill and deconditioned. Currently on room air.   HQI:ONGEXBM tube present Respiratory system: Bilateral decreased breath sounds at bases with scattered crackles Cardiovascular system: S1 & S2 heard, tachycardic intermittently Gastrointestinal system: Abdomen is nondistended, soft and nontender. Normal bowel sounds heard. Extremities: No cyanosis, clubbing, edema  Central nervous system: Awake, extremely slow to respond, poor historian.  Speech is still incomprehensible.  No focal neurological deficits. Moving extremities Skin: No rashes, lesions or ulcers Psychiatry: Not agitated currently.  Flat affect    Data Reviewed: I have personally reviewed following labs and imaging studies  CBC: Recent Labs  Lab 11/18/21 1130 11/18/21 1134 11/19/21 0231 11/21/21 0403 11/23/21 0619  11/24/21 0404 11/25/21 0418  WBC 7.5  --  5.7 7.9 9.3 9.5 8.6  NEUTROABS 4.3  --   --   --   --   --   --   HGB 13.6   < > 13.5 15.3 14.2 13.5 12.0*  HCT 38.8*   < > 38.1* 45.7 40.3 38.6* 33.9*  MCV 104.6*  --  102.1* 106.0* 103.3* 104.0* 104.0*  PLT 224  --  214 185 209 219 232   < > = values in this interval not displayed.   Basic Metabolic Panel: Recent Labs  Lab 11/19/21 0231 11/21/21 0403 11/22/21 1700 11/23/21 0619 11/23/21 1816 11/24/21 0404 11/25/21 0418  NA 134* 134*  --  134*  --  141 142  K 4.3 4.0  --  3.0*  --  4.1 3.3*  CL 99 101  --  102  --  113* 110  CO2 21* 20*  --  19*  --  20* 20*  GLUCOSE 99 95  --  96  --  117* 122*  BUN 7* 8  --  14  --  14 14  CREATININE 0.65 0.69  --  0.60*  --  0.76 0.80  CALCIUM 9.3 9.8  --  9.3  --  9.3 9.1  MG 1.7 1.8 1.8 1.7 2.6* 2.1  --   PHOS  --   --  2.8 3.3 3.4 3.4  --    GFR: Estimated Creatinine Clearance: 76.6 mL/min (by C-G formula based on SCr of 0.8 mg/dL). Liver Function Tests: Recent Labs  Lab 11/18/21 1130 11/19/21 0231 11/21/21 0403  AST 76* 59* 56*  ALT 38 34 31  ALKPHOS 58 53 61  BILITOT 0.5 0.7 0.6  PROT 8.0 7.7 8.3*  ALBUMIN 4.0 3.8 4.0   No results for input(s): "LIPASE", "AMYLASE" in the last 168 hours. No results for input(s): "AMMONIA" in the last 168 hours. Coagulation Profile: Recent Labs  Lab 11/18/21 1130 11/19/21 0231  INR 1.0 1.0   Cardiac Enzymes: No results for input(s): "CKTOTAL", "CKMB", "CKMBINDEX", "TROPONINI" in the last 168 hours. BNP (last 3 results) No results for input(s): "PROBNP" in the last 8760 hours. HbA1C: No results for input(s): "HGBA1C" in the last 72 hours. CBG: Recent Labs  Lab 11/24/21 1631 11/24/21 2012 11/24/21 2341 11/25/21 0358 11/25/21 0851  GLUCAP 117* 123* 130* 124* 122*   Lipid Profile: No results for input(s): "CHOL", "HDL", "LDLCALC", "TRIG", "CHOLHDL", "LDLDIRECT" in the last 72 hours. Thyroid Function Tests: No results for input(s):  "TSH", "T4TOTAL", "FREET4", "T3FREE", "THYROIDAB" in the last 72 hours. Anemia Panel: No results for input(s): "VITAMINB12", "FOLATE", "FERRITIN", "TIBC", "IRON", "RETICCTPCT" in the last 72 hours. Sepsis Labs: No results for input(s): "PROCALCITON", "LATICACIDVEN" in the last 168 hours.  Recent Results (from the past 240 hour(s))  MRSA Next Gen by PCR, Nasal     Status: None   Collection Time: 11/18/21  1:13 PM   Specimen: Nasal Mucosa; Nasal Swab  Result Value Ref Range Status   MRSA by PCR Next Gen NOT DETECTED NOT DETECTED Final    Comment: (NOTE) The GeneXpert MRSA Assay (FDA approved for NASAL specimens only), is one component of a comprehensive MRSA colonization surveillance program. It is not intended to diagnose MRSA infection nor to guide or monitor treatment  for MRSA infections. Test performance is not FDA approved in patients less than 75 years old. Performed at [facility], [address], [place], [place] [address]          Radiology Studies: DG CHEST PORT 1 VIEW  Result Date: 11/24/2021 CLINICAL DATA:  Fever EXAM: PORTABLE CHEST 1 VIEW COMPARISON:  Chest radiograph done on 06/16/2016, CT chest done on 10/20/2020 FINDINGS: Cardiac size is within normal limits. Distal portion of enteric tube is seen in the stomach. Subtle increased interstitial markings are seen in left lower lung field. There is no pleural effusion or pneumothorax. IMPRESSION: Increased interstitial markings in the left lower lung fields suggest interstitial pneumonia. Electronically Signed   By: Ernie Avena M.D.   On: 11/24/2021 12:04        Scheduled Meds:  amLODipine  10 mg Per Tube Daily   Chlorhexidine Gluconate Cloth  6 each Topical Daily   feeding supplement (PROSource TF20)  60 mL Per Tube Daily   folic acid  1 mg Per Tube Daily   heparin injection (subcutaneous)  5,000 Units Subcutaneous Q8H   hydrocortisone cream   Topical BID   multivitamin with minerals  1 tablet  Per Tube Daily   mouth rinse  15 mL Mouth Rinse 4 times per day   pantoprazole (PROTONIX) IV  40 mg Intravenous QHS   PHENObarbital  32.5 mg Intravenous Q8H   senna-docusate  1 tablet Per Tube BID   sodium chloride flush  3 mL Intravenous Once   thiamine  100 mg Per Tube Daily   Continuous Infusions:  sodium chloride 25 mL/hr at 11/25/21 0605   ampicillin-sulbactam (UNASYN) IV 3 g (11/25/21 0914)   feeding supplement (PEPTAMEN 1.5 CAL) 40 mL/hr at 11/25/21 0630          Suleyma Wafer Hanley Ben, MD [facility] 11/25/2021, 10:18 AM

## 2021-11-26 DIAGNOSIS — E43 Unspecified severe protein-calorie malnutrition: Secondary | ICD-10-CM | POA: Diagnosis not present

## 2021-11-26 DIAGNOSIS — F1010 Alcohol abuse, uncomplicated: Secondary | ICD-10-CM | POA: Diagnosis not present

## 2021-11-26 DIAGNOSIS — I610 Nontraumatic intracerebral hemorrhage in hemisphere, subcortical: Secondary | ICD-10-CM | POA: Diagnosis not present

## 2021-11-26 DIAGNOSIS — E871 Hypo-osmolality and hyponatremia: Secondary | ICD-10-CM | POA: Diagnosis not present

## 2021-11-26 DIAGNOSIS — F10931 Alcohol use, unspecified with withdrawal delirium: Secondary | ICD-10-CM | POA: Diagnosis not present

## 2021-11-26 DIAGNOSIS — Z720 Tobacco use: Secondary | ICD-10-CM

## 2021-11-26 LAB — GLUCOSE, CAPILLARY
Glucose-Capillary: 100 mg/dL — ABNORMAL HIGH (ref 70–99)
Glucose-Capillary: 109 mg/dL — ABNORMAL HIGH (ref 70–99)
Glucose-Capillary: 112 mg/dL — ABNORMAL HIGH (ref 70–99)
Glucose-Capillary: 121 mg/dL — ABNORMAL HIGH (ref 70–99)
Glucose-Capillary: 125 mg/dL — ABNORMAL HIGH (ref 70–99)
Glucose-Capillary: 125 mg/dL — ABNORMAL HIGH (ref 70–99)

## 2021-11-26 LAB — URINE CULTURE: Culture: NO GROWTH

## 2021-11-26 LAB — COMPREHENSIVE METABOLIC PANEL
ALT: 33 U/L (ref 0–44)
AST: 50 U/L — ABNORMAL HIGH (ref 15–41)
Albumin: 2.8 g/dL — ABNORMAL LOW (ref 3.5–5.0)
Alkaline Phosphatase: 90 U/L (ref 38–126)
Anion gap: 9 (ref 5–15)
BUN: 14 mg/dL (ref 8–23)
CO2: 19 mmol/L — ABNORMAL LOW (ref 22–32)
Calcium: 9.3 mg/dL (ref 8.9–10.3)
Chloride: 113 mmol/L — ABNORMAL HIGH (ref 98–111)
Creatinine, Ser: 0.68 mg/dL (ref 0.61–1.24)
GFR, Estimated: >60 mL/min (ref >60)
Glucose, Bld: 111 mg/dL — ABNORMAL HIGH (ref 70–99)
Potassium: 3.7 mmol/L (ref 3.5–5.1)
Sodium: 141 mmol/L (ref 135–145)
Total Bilirubin: 0.3 mg/dL (ref 0.3–1.2)
Total Protein: 7 g/dL (ref 6.5–8.1)

## 2021-11-26 LAB — MAGNESIUM: Magnesium: 1.7 mg/dL (ref 1.7–2.4)

## 2021-11-26 MED ORDER — VITAL 1.5 CAL PO LIQD
1000.0000 mL | ORAL | Status: DC
  Administered 2021-11-27 (×2): 1000 mL
  Filled 2021-11-26 (×3): qty 1000

## 2021-11-26 NOTE — Progress Notes (Addendum)
PROGRESS NOTE    Brent Warren  [id number] DOB: 02-Jul-1960 DOA: 11/18/2021 PCP: Ladell Pier, MD   Brief Narrative:  61 year old male with history of HTN, tobacco use, chronic alcohol use presented with sudden onset slurred speech and right-sided weakness.  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  He was admitted to the neuro ICU, remained stable and transferred to the hospitalist service on 11/13.  He started to develop increased alcohol withdrawal symptoms on 11/13.     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  LDL 62, A1c is 5.1 -PT/OT recommending CIR.  As per CIR, patient is not a good candidate for CIR.  TOC consulted for possible SNF placement -SLP following.  Currently NPO.  Continue cortrak (placed on 11/22/2021) feeding  Alcohol abuse, alcohol withdrawal with delirium tremens -Patient with significant alcohol withdrawal symptoms.  PCCM consulted and have signed off.  Currently on phenobarbital taper -Vitamin B1 normal -Continue thiamine, folic acid and multivitamin  Hypertension -Monitor blood pressure.  Continue amlodipine  Hypokalemia -Improved  Possible aspiration pneumonia Fever Possible UTI -Afebrile over the last 24 hours.  Currently on Unasyn, started on 11/24/2021: Finish 5-day course of therapy.  Chest x-ray on 11/24/2021 suggested possible interstitial pneumonia. -UA suggestive of UTI as well.  Cultures negative so far  Vitamin B12 deficiency -B12 177.  Folate 12.7.  Supplement B12.  Hyponatremia -Resolved  Macrocytosis -Possibly from alcohol abuse  Elevated LFTs -Possibly from alcohol abuse.  Improving.  Monitor intermittently.  Tobacco use, history of emphysema -Continue nebs as needed   DVT prophylaxis: Heparin subcutaneous Code  Status: Full Family Communication: Spoke to brother/[name] on phone Disposition Plan: Status is: Inpatient Remains inpatient appropriate because: Of severity of illness  Consultants: Neurology/PCCM  Procedures: As above  Antimicrobials: Unasyn from 11/24/2021 onwards   Subjective: Patient seen and examined at bedside.  No agitation, fever, vomiting reported.  Extremely poor historian.  Speech is still incomprehensible. Objective: Vitals:   11/25/21 2017 11/25/21 2345 11/26/21 0408 11/26/21 0439  BP: (!) 141/92 (!) 152/86 (!) 154/88   Pulse: 87 74 91   Resp:  19 20   Temp:  98.5 F (36.9 C) 97.8 F (36.6 C)   TempSrc:  Oral Oral   SpO2:  99% 99%   Weight:    53.1 kg  Height:        Intake/Output Summary (Last 24 hours) at 11/26/2021 0754 Last data filed at 11/25/2021 1939 Gross per 24 hour  Intake 740.42 ml  Output 950 ml  Net -209.58 ml    Filed Weights   11/23/21 0600 11/25/21 0446 11/26/21 0439  Weight: 54 kg 56.6 kg 53.1 kg    Examination:  General: On room air.  No distress.  Chronically ill and deconditioned looking. ENT/neck: No thyromegaly.  JVD is not elevated. Cortrak tube still present respiratory: Decreased breath sounds at bases bilaterally with some crackles; no wheezing  CVS: S1-S2 heard, rate controlled currently Abdominal: Soft, nontender, slightly distended; no organomegaly,  bowel sounds are heard Extremities: Trace lower extremity edema; no cyanosis  CNS: Awake, extremely poor historian.  Slightly confused.  Incomprehensible speech.  No focal neurologic deficit.  Moves extremities Lymph: No obvious lymphadenopathy Skin: No obvious ecchymosis/lesions  psych: Not agitated.  Extremely flat affect. musculoskeletal: No obvious joint swelling/deformity  Data Reviewed: I have personally reviewed following labs and imaging studies  CBC: Recent Labs  Lab 11/21/21 0403 11/23/21 0619 11/24/21 0404 11/25/21 0418  WBC 7.9 9.3 9.5 8.6  HGB  15.3 14.2 13.5 12.0*  HCT 45.7 40.3 38.6* 33.9*  MCV 106.0* 103.3* 104.0* 104.0*  PLT 185 209 219 232    Basic Metabolic Panel: Recent Labs  Lab 11/21/21 0403 11/22/21 1700 11/23/21 0619 11/23/21 1816 11/24/21 0404 11/25/21 0418 11/26/21 0433  NA 134*  --  134*  --  141 142 141  K 4.0  --  3.0*  --  4.1 3.3* 3.7  CL 101  --  102  --  113* 110 113*  CO2 20*  --  19*  --  20* 20* 19*  GLUCOSE 95  --  96  --  117* 122* 111*  BUN 8  --  14  --  14 14 14   CREATININE 0.69  --  0.60*  --  0.76 0.80 0.68  CALCIUM 9.8  --  9.3  --  9.3 9.1 9.3  MG 1.8 1.8 1.7 2.6* 2.1  --  1.7  PHOS  --  2.8 3.3 3.4 3.4  --   --     GFR: Estimated Creatinine Clearance: 71.9 mL/min (by C-G formula based on SCr of 0.68 mg/dL). Liver Function Tests: Recent Labs  Lab 11/21/21 0403 11/26/21 0433  AST 56* 50*  ALT 31 33  ALKPHOS 61 90  BILITOT 0.6 0.3  PROT 8.3* 7.0  ALBUMIN 4.0 2.8*    No results for input(s): "LIPASE", "AMYLASE" in the last 168 hours. No results for input(s): "AMMONIA" in the last 168 hours. Coagulation Profile: No results for input(s): "INR", "PROTIME" in the last 168 hours.  Cardiac Enzymes: No results for input(s): "CKTOTAL", "CKMB", "CKMBINDEX", "TROPONINI" in the last 168 hours. BNP (last 3 results) No results for input(s): "PROBNP" in the last 8760 hours. HbA1C: No results for input(s): "HGBA1C" in the last 72 hours. CBG: Recent Labs  Lab 11/25/21 1248 11/25/21 1629 11/25/21 1932 11/26/21 0022 11/26/21 0354  GLUCAP 107* 107* 104* 121* 109*    Lipid Profile: No results for input(s): "CHOL", "HDL", "LDLCALC", "TRIG", "CHOLHDL", "LDLDIRECT" in the last 72 hours. Thyroid Function Tests: No results for input(s): "TSH", "T4TOTAL", "FREET4", "T3FREE", "THYROIDAB" in the last 72 hours. Anemia Panel: No results for input(s): "VITAMINB12", "FOLATE", "FERRITIN", "TIBC", "IRON", "RETICCTPCT" in the last 72 hours. Sepsis Labs: No results for input(s):  "PROCALCITON", "LATICACIDVEN" in the last 168 hours.  Recent Results (from the past 240 hour(s))  MRSA Next Gen by PCR, Nasal     Status: None   Collection Time: 11/18/21  1:13 PM   Specimen: Nasal Mucosa; Nasal Swab  Result Value Ref Range Status   MRSA by PCR Next Gen NOT DETECTED NOT DETECTED Final    Comment: (NOTE) The GeneXpert MRSA Assay (FDA approved for NASAL specimens only), is one component of a comprehensive MRSA colonization surveillance program. It is not intended to diagnose MRSA infection nor to guide or monitor treatment for MRSA infections. Test performance is not FDA approved in patients less than 81 years old. Performed at [facility], [address] [place], [place]   Culture, blood (Routine X 2) w Reflex to ID Panel     Status: None (Preliminary result)   Collection Time: 11/25/21 10:56 AM   Specimen: BLOOD LEFT ARM  Result Value Ref Range Status   Specimen Description BLOOD LEFT ARM  Final   Special Requests   Final    IN PEDIATRIC BOTTLE Blood Culture results may not be optimal due to an inadequate volume of blood received in culture bottles   Culture   Final    NO GROWTH < 24 HOURS Performed at [facility] [address] [place], [place] [address]    Report Status PENDING  Incomplete  Culture, blood (Routine X 2) w Reflex to ID Panel     Status: None (Preliminary result)   Collection Time: 11/25/21 11:13 AM   Specimen: BLOOD LEFT HAND  Result Value Ref Range Status   Specimen Description BLOOD LEFT HAND  Final   Special Requests   Final    BOTTLES DRAWN AEROBIC AND ANAEROBIC Blood Culture adequate volume   Culture   Final    NO GROWTH < 24 HOURS Performed at [facility], [address] [place], [place] [address]    Report Status PENDING  Incomplete         Radiology Studies: DG CHEST PORT 1 VIEW  Result Date: 11/24/2021 CLINICAL DATA:  Fever EXAM: PORTABLE CHEST 1 VIEW COMPARISON:  Chest radiograph done on  06/16/2016, CT chest done on 10/20/2020 FINDINGS: Cardiac size is within normal limits. Distal portion of enteric tube is seen in the stomach. Subtle increased interstitial markings are seen in left lower lung field. There is no pleural effusion or pneumothorax. IMPRESSION: Increased interstitial markings in the left lower lung fields suggest interstitial pneumonia. Electronically Signed   By: Elmer Picker M.D.   On: 11/24/2021 12:04        Scheduled Meds:  amLODipine  10 mg Per Tube Daily   Chlorhexidine Gluconate Cloth  6 each Topical Daily   vitamin B-12  1,000 mcg Per Tube Daily   feeding supplement (PROSource TF20)  60 mL Per Tube Daily   folic acid  1 mg Per Tube Daily   heparin injection (subcutaneous)  5,000 Units Subcutaneous Q8H   hydrocortisone cream   Topical BID   multivitamin with minerals  1 tablet Per Tube Daily   mouth rinse  15 mL Mouth Rinse 4 times per day   pantoprazole (PROTONIX) IV  40 mg Intravenous QHS   PHENObarbital  32.5 mg Intravenous Q8H   senna-docusate  1 tablet Per Tube BID   sodium chloride flush  3 mL Intravenous Once   thiamine  100 mg Per Tube Daily   Continuous Infusions:  sodium chloride 25 mL/hr at 11/25/21 2155   ampicillin-sulbactam (UNASYN) IV 3 g (11/26/21 0323)   feeding supplement (PEPTAMEN 1.5 CAL) 50 mL/hr at 11/26/21 0630          Price Lachapelle Starla Link, MD [facility] 11/26/2021, 7:54 AM

## 2021-11-26 NOTE — Progress Notes (Signed)
Speech Language Pathology Treatment: Dysphagia  Patient Details Name: Brent Warren MRN: [id number] DOB: 1960/10/08 Today's Date: 11/26/2021 Time: 1040-1100 SLP Time Calculation (min) (ACUTE ONLY): 20 min  Assessment / Plan / Recommendation Clinical Impression  Patient seen by SLP for skilled treatment focused on dysphagia goals. Patient was awake, alert, cooperative and pleasant but continues to be significantly dysarthric with no awareness. He was able to tell SLP his name when asked but not able to say where he was, what his home address is and was not able to answer yes/no questions to verify information. SLP could understand approximately 10% of what he was saying and suspect that in addition to severe dysarthria, his cognitive deficits likely impact his expressive language abilities. After oral care, SLP assessed patient's toleration of honey thick juice via spoon sips. He did actively accept boluses into mouth but then exhibited oral holding, swallow initiation delay and incomplete laryngeal elevation per palpation. No coughing or throat clearing observed. After four spoon sips, patient then was too distracted to adequately participate. He did not exhibit any awareness to time/place/situation. SLP recommending continue NPO status and will follow for PO readiness.    HPI HPI: 61 year old male with history of HTN, tobacco use, EtOH use who comes into the hospital with sudden onset slurred speech and right-sided weakness.  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   He started to develop increased withdrawal symptoms on 11/13      SLP Plan  Continue with current plan of care      Recommendations for follow up therapy are one component of a multi-disciplinary discharge planning process, led by the attending physician.  Recommendations may be updated based on patient status, additional functional criteria and insurance authorization.    Recommendations  Diet recommendations:  NPO Medication Administration: Via alternative means                Oral Care Recommendations: Oral care QID;Staff/trained caregiver to provide oral care Follow Up Recommendations: Skilled nursing-short term rehab (<3 hours/day) Assistance recommended at discharge: Frequent or constant Supervision/Assistance SLP Visit Diagnosis: Dysphagia, unspecified (R13.10) Plan: Continue with current plan of care       Angela Nevin, MA, CCC-SLP Speech Therapy

## 2021-11-27 DIAGNOSIS — F10931 Alcohol use, unspecified with withdrawal delirium: Secondary | ICD-10-CM | POA: Diagnosis not present

## 2021-11-27 DIAGNOSIS — I610 Nontraumatic intracerebral hemorrhage in hemisphere, subcortical: Secondary | ICD-10-CM | POA: Diagnosis not present

## 2021-11-27 DIAGNOSIS — E871 Hypo-osmolality and hyponatremia: Secondary | ICD-10-CM | POA: Diagnosis not present

## 2021-11-27 DIAGNOSIS — E43 Unspecified severe protein-calorie malnutrition: Secondary | ICD-10-CM

## 2021-11-27 LAB — GLUCOSE, CAPILLARY
Glucose-Capillary: 103 mg/dL — ABNORMAL HIGH (ref 70–99)
Glucose-Capillary: 113 mg/dL — ABNORMAL HIGH (ref 70–99)
Glucose-Capillary: 114 mg/dL — ABNORMAL HIGH (ref 70–99)
Glucose-Capillary: 115 mg/dL — ABNORMAL HIGH (ref 70–99)
Glucose-Capillary: 117 mg/dL — ABNORMAL HIGH (ref 70–99)
Glucose-Capillary: 117 mg/dL — ABNORMAL HIGH (ref 70–99)
Glucose-Capillary: 123 mg/dL — ABNORMAL HIGH (ref 70–99)

## 2021-11-27 LAB — BASIC METABOLIC PANEL
Anion gap: 10 (ref 5–15)
BUN: 16 mg/dL (ref 8–23)
CO2: 24 mmol/L (ref 22–32)
Calcium: 9.6 mg/dL (ref 8.9–10.3)
Chloride: 108 mmol/L (ref 98–111)
Creatinine, Ser: 0.7 mg/dL (ref 0.61–1.24)
GFR, Estimated: >60 mL/min (ref >60)
Glucose, Bld: 92 mg/dL (ref 70–99)
Potassium: 3.5 mmol/L (ref 3.5–5.1)
Sodium: 142 mmol/L (ref 135–145)

## 2021-11-27 LAB — MAGNESIUM: Magnesium: 1.7 mg/dL (ref 1.7–2.4)

## 2021-11-27 NOTE — Progress Notes (Signed)
Physical Therapy Treatment Patient Details Name: Brent Warren MRN: [id number] DOB: [birth year] Today's Date: 11/27/2021   History of Present Illness 61 yo male presenting 11/11 with R-sided weakness, facial droop and slurred speech. 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 PMH includes: HTN, COPD, and alcohol abuse.    PT Comments    Pt with continued progress towards acute goals wit good effort, however pt continues to be limited by impaired coordination and cognition, as well as weakness. Pt able to come to sitting EOB with min assist to elevate trunk. Pt able to complete x2 transfers to stand with RW with max assist to achieve midline and full upright standing as pt with strong L lateral and posterior lean, Pt able to correct and maintain for short 1-2 second periods. Pt continues to benefit from skilled PT services to progress toward functional mobility goals.    Recommendations for follow up therapy are one component of a multi-disciplinary discharge planning process, led by the attending physician.  Recommendations may be updated based on patient status, additional functional criteria and insurance authorization.  Follow Up Recommendations  Acute inpatient rehab (3hours/day)     Assistance Recommended at Discharge Frequent or constant Supervision/Assistance  Patient can return home with the following A lot of help with walking and/or transfers;A lot of help with bathing/dressing/bathroom;Assistance with cooking/housework;Direct supervision/assist for medications management;Direct supervision/assist for financial management;Assist for transportation;Help with stairs or ramp for entrance   Equipment Recommendations  Other (comment) (TBD)    Recommendations for Other Services       Precautions / Restrictions Precautions Precautions: Fall Precaution Comments: watch HR Restrictions Weight Bearing Restrictions: No     Mobility  Bed Mobility Overal bed mobility: Needs  Assistance Bed Mobility: Supine to Sit     Supine to sit: Min assist Sit to supine: Max assist   General bed mobility comments: min assist to elevate trunk, pt able to bring BLEs to and off EOB without assist, helicopter to return to supine as pt with increased difficulty following commands and fatigue increased    Transfers Overall transfer level: Needs assistance Equipment used: Rolling walker (2 wheels) Transfers: Sit to/from Stand, Bed to chair/wheelchair/BSC Sit to Stand: Mod assist, Max assist Stand pivot transfers: Max assist, +2 physical assistance        Lateral/Scoot Transfers: Mod assist General transfer comment: max assist to come to upright standing, pt with strong L lateral and posterio lean in standing needing max multimodal cues to correct with pt able to correct for short pweior 1-2 seconds, pt unable to maintain R hand on RW, max assist to laterally scoot toward [facility] as pt scoot to L on own throughout session toward foot of bed    Ambulation/Gait               General Gait Details: deferred secnodary to inability to maintain balance in standing   Stairs             Wheelchair Mobility    Modified Rankin (Stroke Patients Only) Modified Rankin (Stroke Patients Only) Pre-Morbid Rankin Score: No symptoms Modified Rankin: Severe disability     Balance Overall balance assessment: Needs assistance Sitting-balance support: Single extremity supported, Feet supported Sitting balance-Leahy Scale: Poor (improving toward fair.) Sitting balance - Comments: pt still reliant on L UE, biased posteriorly, but maintaining balance for secs at a time. Postural control: Posterior lean Standing balance support: Single extremity supported, During functional activity, Reliant on assistive device for balance Standing balance-Leahy Scale:  Poor Standing balance comment: reliant on external support and knee bracing/guarding to maintain balance in upright stance.                             Cognition Arousal/Alertness: Awake/alert Behavior During Therapy: Flat affect, Restless Overall Cognitive Status: Difficult to assess (NT formally) Area of Impairment: Following commands, Orientation, Attention, Problem solving, Awareness, Safety/judgement                 Orientation Level:  (can answer yes no to name and birthdate)     Following Commands: Follows one step commands with increased time Safety/Judgement: Decreased awareness of safety, Decreased awareness of deficits     General Comments: making eye contact at times; attempting to communicate verbally        Exercises General Exercises - Lower Extremity Heel Slides: AROM, Right, Left, 20 reps, Supine Straight Leg Raises: AROM, Right, Left, 20 reps, Supine Other Exercises Other Exercises: BUE general AA/PROM through all ranges; not using funcitonally although moving LUE better than R    General Comments        Pertinent Vitals/Pain Pain Assessment Pain Assessment: Faces Faces Pain Scale: No hurt Pain Intervention(s): Monitored during session    Home Living                          Prior Function            PT Goals (current goals can now be found in the care plan section) Acute Rehab PT Goals Patient Stated Goal: return home PT Goal Formulation: With patient Time For Goal Achievement: 12/03/21    Frequency    Min 3X/week      PT Plan Current plan remains appropriate    Co-evaluation              AM-PAC PT "6 Clicks" Mobility   Outcome Measure  Help needed turning from your back to your side while in a flat bed without using bedrails?: A Lot Help needed moving from lying on your back to sitting on the side of a flat bed without using bedrails?: A Lot Help needed moving to and from a bed to a chair (including a wheelchair)?: Total Help needed standing up from a chair using your arms (e.g., wheelchair or bedside chair)?: Total Help  needed to walk in hospital room?: Total Help needed climbing 3-5 steps with a railing? : Total 6 Click Score: 8    End of Session Equipment Utilized During Treatment: Gait belt Activity Tolerance: Patient tolerated treatment well Patient left: in bed;with call bell/phone within reach;with bed alarm set;with restraints reapplied Nurse Communication: Mobility status PT Visit Diagnosis: Other abnormalities of gait and mobility (R26.89);Muscle weakness (generalized) (M62.81);Hemiplegia and hemiparesis Hemiplegia - Right/Left: Right Hemiplegia - dominant/non-dominant: Dominant Hemiplegia - caused by: Nontraumatic intracerebral hemorrhage     Time: 1237-1258 PT Time Calculation (min) (ACUTE ONLY): 21 min  Charges:  $Therapeutic Activity: 8-22 mins                     Brent Warren R. PTA Acute Rehabilitation Services Office: [phone]   Catalina Antigua 11/27/2021, 1:19 PM

## 2021-11-27 NOTE — Progress Notes (Signed)
Speech Language Pathology Treatment: Dysphagia;Cognitive-Linquistic  Patient Details Name: Brent Warren MRN: [id number] DOB: September 22, 1960 Today's Date: 11/27/2021 Time: 9211-9417 SLP Time Calculation (min) (ACUTE ONLY): 25 min  Assessment / Plan / Recommendation Clinical Impression  Pt demonstrates great progress in arousal and attention. He was able to sit upright, follow commands, consume honey thick liquids from a cup and spoon trials of puree. There were some signs of dysphagia and MBS tomorrow warranted. Pt becoming more intelligible; able to count to 10 with a model for over-articulation. 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 Laughing triggers crying. Question pseudobulbar affect. MBS tomorrow, expect diet initiation.   HPI HPI: 61 year old male with history of HTN, tobacco use, EtOH use who comes into the hospital with sudden onset slurred speech and right-sided weakness.  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   He started to develop increased withdrawal symptoms on 11/13      SLP Plan  MBS      Recommendations for follow up therapy are one component of a multi-disciplinary discharge planning process, led by the attending physician.  Recommendations may be updated based on patient status, additional functional criteria and insurance authorization.    Recommendations  Diet recommendations: NPO                Oral Care Recommendations: Oral care QID Follow Up Recommendations: Skilled nursing-short term rehab (<3 hours/day) Assistance recommended at discharge: Frequent or constant Supervision/Assistance SLP Visit Diagnosis: Dysphagia, unspecified (R13.10) Plan: MBS           Brent Warren, Riley Nearing  11/27/2021, 2:56 PM

## 2021-11-27 NOTE — Plan of Care (Signed)
Problem: Education: Goal: Knowledge of disease or condition will improve Outcome: Progressing Goal: Knowledge of secondary prevention will improve (MUST DOCUMENT ALL) Outcome: Progressing Goal: Knowledge of patient specific risk factors will improve Brent Warren N/A or DELETE if not current risk factor) Outcome: Progressing   Problem: Intracerebral Hemorrhage Tissue Perfusion: Goal: Complications of Intracerebral Hemorrhage will be minimized Outcome: Progressing   Problem: Self-Care: Goal: Ability to participate in self-care as condition permits will improve Outcome: Progressing   Problem: Nutrition: Goal: Risk of aspiration will decrease Outcome: Progressing Goal: Dietary intake will improve Outcome: Progressing   Problem: Safety: Goal: Non-violent Restraint(s) Outcome: Progressing   Problem: Safety: Goal: Ability to remain free from injury will improve Outcome: Progressing

## 2021-11-27 NOTE — Progress Notes (Signed)
PROGRESS NOTE    Brent Warren  ZOX:[id number] DOB: 1960-07-25 DOA: 11/18/2021 PCP: Marcine Matar, MD   Brief Narrative:  61 year old male with history of HTN, tobacco use, chronic alcohol use presented with sudden onset slurred speech and right-sided weakness.  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  He was admitted to the neuro ICU, remained stable and transferred to the hospitalist service on 11/13.  He started to develop increased alcohol withdrawal symptoms on 11/13.     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  LDL 62, A1c is 5.1 -PT/OT recommending CIR.  As per CIR, patient is not a good candidate for CIR.  TOC consulted for possible SNF placement -SLP following.  Currently NPO.  Continue cortrak (placed on 11/22/2021) feeding  Alcohol abuse, alcohol withdrawal with delirium tremens -Patient with significant alcohol withdrawal symptoms.  PCCM consulted and have signed off.  Currently on phenobarbital taper -Vitamin B1 normal -Continue thiamine, folic acid and multivitamin  Hypertension -Monitor blood pressure.  Continue amlodipine.  Intermittently on the higher side.  Hypokalemia -Improved  Possible aspiration pneumonia Fever Possible UTI -Afebrile over the last 24 hours.  Currently on Unasyn, started on 11/24/2021: Finish 5-day course of therapy.  Chest x-ray on 11/24/2021 suggested possible interstitial pneumonia. -UA suggestive of UTI as well.  Cultures negative so far  Vitamin B12 deficiency -B12 177.  Folate 12.7.  Supplement B12.  Hyponatremia -Resolved  Macrocytosis -Possibly from alcohol abuse  Elevated LFTs -Possibly from alcohol abuse.  Improving.  Monitor intermittently.  Tobacco use, history of emphysema -Continue nebs as needed   DVT  prophylaxis: Heparin subcutaneous Code Status: Full Family Communication: Spoke to brother/[name] on phone on 11/26/2021 Disposition Plan: Status is: Inpatient Remains inpatient appropriate because: Of severity of illness  Consultants: Neurology/PCCM  Procedures: As above  Antimicrobials: Unasyn from 11/24/2021 onwards   Subjective: Patient seen and examined at bedside.  Continues to have incomprehensible speech.  No fever, agitation or seizures reported.   Objective: Vitals:   11/27/21 0342 11/27/21 0402 11/27/21 0500 11/27/21 0700  BP: (!) 166/105 (!) 156/95  (!) 148/94  Pulse: 93 (!) 106  93  Resp: 17   18  Temp: 99.6 F (37.6 C)   98.9 F (37.2 C)  TempSrc: Oral   Oral  SpO2: 100%   100%  Weight:   57.8 kg   Height:        Intake/Output Summary (Last 24 hours) at 11/27/2021 0752 Last data filed at 11/27/2021 0559 Gross per 24 hour  Intake --  Output 1300 ml  Net -1300 ml    Filed Weights   11/25/21 0446 11/26/21 0439 11/27/21 0500  Weight: 56.6 kg 53.1 kg 57.8 kg    Examination:  General: No acute distress.  Still on room air.  Chronically ill and deconditioned looking. ENT/neck: No elevated JVD or palpable Cortrak tube still present respiratory: Bilateral decreased breath sounds at bases with scattered crackles CVS: Intermittently tachycardic; S1 and S2 are heard  abdominal: Soft, nontender, distended mildly; no organomegaly, normal bowel sounds heard  extremities: No clubbing; mild lower extremity edema present  CNS: Awake, very poor historian.  Still confused.  Continues to have incomprehensible speech.  No focal neurologic deficit.  Moving extremities Lymph: No palpable lymphadenopathy noted  skin: No petechiae/rashes noted  Affect psych: Affect is flat.  No signs of agitation.   Musculoskeletal: No obvious joint tenderness/erythema    Data Reviewed: I have personally reviewed following labs and imaging studies  CBC: Recent Labs  Lab  11/21/21 0403 11/23/21 0619 11/24/21 0404 11/25/21 0418  WBC 7.9 9.3 9.5 8.6  HGB 15.3 14.2 13.5 12.0*  HCT 45.7 40.3 38.6* 33.9*  MCV 106.0* 103.3* 104.0* 104.0*  PLT 185 209 219 232    Basic Metabolic Panel: Recent Labs  Lab 11/22/21 1700 11/23/21 0619 11/23/21 1816 11/24/21 0404 11/25/21 0418 11/26/21 0433 11/27/21 0327  NA  --  134*  --  141 142 141 142  K  --  3.0*  --  4.1 3.3* 3.7 3.5  CL  --  102  --  113* 110 113* 108  CO2  --  19*  --  20* 20* 19* 24  GLUCOSE  --  96  --  117* 122* 111* 92  BUN  --  14  --  14 14 14 16   CREATININE  --  0.60*  --  0.76 0.80 0.68 0.70  CALCIUM  --  9.3  --  9.3 9.1 9.3 9.6  MG 1.8 1.7 2.6* 2.1  --  1.7 1.7  PHOS 2.8 3.3 3.4 3.4  --   --   --     GFR: Estimated Creatinine Clearance: 78.3 mL/min (by C-G formula based on SCr of 0.7 mg/dL). Liver Function Tests: Recent Labs  Lab 11/21/21 0403 11/26/21 0433  AST 56* 50*  ALT 31 33  ALKPHOS 61 90  BILITOT 0.6 0.3  PROT 8.3* 7.0  ALBUMIN 4.0 2.8*    No results for input(s): "LIPASE", "AMYLASE" in the last 168 hours. No results for input(s): "AMMONIA" in the last 168 hours. Coagulation Profile: No results for input(s): "INR", "PROTIME" in the last 168 hours.  Cardiac Enzymes: No results for input(s): "CKTOTAL", "CKMB", "CKMBINDEX", "TROPONINI" in the last 168 hours. BNP (last 3 results) No results for input(s): "PROBNP" in the last 8760 hours. HbA1C: No results for input(s): "HGBA1C" in the last 72 hours. CBG: Recent Labs  Lab 11/26/21 1125 11/26/21 1609 11/26/21 2025 11/27/21 0002 11/27/21 0346  GLUCAP 100* 125* 112* 117* 114*    Lipid Profile: No results for input(s): "CHOL", "HDL", "LDLCALC", "TRIG", "CHOLHDL", "LDLDIRECT" in the last 72 hours. Thyroid Function Tests: No results for input(s): "TSH", "T4TOTAL", "FREET4", "T3FREE", "THYROIDAB" in the last 72 hours. Anemia Panel: No results for input(s): "VITAMINB12", "FOLATE", "FERRITIN", "TIBC", "IRON",  "RETICCTPCT" in the last 72 hours. Sepsis Labs: No results for input(s): "PROCALCITON", "LATICACIDVEN" in the last 168 hours.  Recent Results (from the past 240 hour(s))  MRSA Next Gen by PCR, Nasal     Status: None   Collection Time: 11/18/21  1:13 PM   Specimen: Nasal Mucosa; Nasal Swab  Result Value Ref Range Status   MRSA by PCR Next Gen NOT DETECTED NOT DETECTED Final    Comment: (NOTE) The GeneXpert MRSA Assay (FDA approved for NASAL specimens only), is one component of a comprehensive MRSA colonization surveillance program. It is not intended to diagnose MRSA infection nor to guide or monitor treatment for MRSA infections. Test performance is not FDA approved in patients less than 55 years old. Performed at [facility], [address], [place], [place]   Urine Culture     Status: None   Collection Time: 11/25/21 10:32 AM   Specimen: Urine, Clean Catch  Result Value Ref  Range Status   Specimen Description URINE, CLEAN CATCH  Final   Special Requests NONE  Final   Culture   Final    NO GROWTH Performed at [facility], [address] [place], [place] [address]    Report Status 11/26/2021 FINAL  Final  Culture, blood (Routine X 2) w Reflex to ID Panel     Status: None (Preliminary result)   Collection Time: 11/25/21 10:56 AM   Specimen: BLOOD LEFT ARM  Result Value Ref Range Status   Specimen Description BLOOD LEFT ARM  Final   Special Requests   Final    IN PEDIATRIC BOTTLE Blood Culture results may not be optimal due to an inadequate volume of blood received in culture bottles   Culture   Final    NO GROWTH < 24 HOURS Performed at [facility], [address] [place], [place] [address]    Report Status PENDING  Incomplete  Culture, blood (Routine X 2) w Reflex to ID Panel     Status: None (Preliminary result)   Collection Time: 11/25/21 11:13 AM   Specimen: BLOOD LEFT HAND  Result Value Ref Range Status   Specimen Description BLOOD  LEFT HAND  Final   Special Requests   Final    BOTTLES DRAWN AEROBIC AND ANAEROBIC Blood Culture adequate volume   Culture   Final    NO GROWTH < 24 HOURS Performed at [facility], [address], [place], [place] [address]    Report Status PENDING  Incomplete         Radiology Studies: No results found.      Scheduled Meds:  amLODipine  10 mg Per Tube Daily   Chlorhexidine Gluconate Cloth  6 each Topical Daily   vitamin B-12  1,000 mcg Per Tube Daily   feeding supplement (PROSource TF20)  60 mL Per Tube Daily   folic acid  1 mg Per Tube Daily   heparin injection (subcutaneous)  5,000 Units Subcutaneous Q8H   hydrocortisone cream   Topical BID   multivitamin with minerals  1 tablet Per Tube Daily   mouth rinse  15 mL Mouth Rinse 4 times per day   pantoprazole (PROTONIX) IV  40 mg Intravenous QHS   senna-docusate  1 tablet Per Tube BID   sodium chloride flush  3 mL Intravenous Once   thiamine  100 mg Per Tube Daily   Continuous Infusions:  ampicillin-sulbactam (UNASYN) IV 3 g (11/27/21 0419)   feeding supplement (VITAL 1.5 CAL) 1,000 mL (11/27/21 0022)          Glade Lloyd, MD [facility] 11/27/2021, 7:52 AM

## 2021-11-28 ENCOUNTER — Inpatient Hospital Stay (HOSPITAL_COMMUNITY): Payer: Medicaid Other

## 2021-11-28 DIAGNOSIS — I610 Nontraumatic intracerebral hemorrhage in hemisphere, subcortical: Secondary | ICD-10-CM | POA: Diagnosis not present

## 2021-11-28 DIAGNOSIS — E871 Hypo-osmolality and hyponatremia: Secondary | ICD-10-CM | POA: Diagnosis not present

## 2021-11-28 DIAGNOSIS — F10931 Alcohol use, unspecified with withdrawal delirium: Secondary | ICD-10-CM | POA: Diagnosis not present

## 2021-11-28 DIAGNOSIS — Z720 Tobacco use: Secondary | ICD-10-CM | POA: Diagnosis not present

## 2021-11-28 DIAGNOSIS — E43 Unspecified severe protein-calorie malnutrition: Secondary | ICD-10-CM | POA: Diagnosis not present

## 2021-11-28 DIAGNOSIS — F1010 Alcohol abuse, uncomplicated: Secondary | ICD-10-CM | POA: Diagnosis not present

## 2021-11-28 LAB — GLUCOSE, CAPILLARY
Glucose-Capillary: 104 mg/dL — ABNORMAL HIGH (ref 70–99)
Glucose-Capillary: 112 mg/dL — ABNORMAL HIGH (ref 70–99)
Glucose-Capillary: 112 mg/dL — ABNORMAL HIGH (ref 70–99)
Glucose-Capillary: 115 mg/dL — ABNORMAL HIGH (ref 70–99)
Glucose-Capillary: 117 mg/dL — ABNORMAL HIGH (ref 70–99)
Glucose-Capillary: 144 mg/dL — ABNORMAL HIGH (ref 70–99)

## 2021-11-28 MED ORDER — JEVITY 1.5 CAL/FIBER PO LIQD
1000.0000 mL | ORAL | Status: DC
  Administered 2021-11-28 – 2021-11-29 (×3): 1000 mL
  Filled 2021-11-28 (×5): qty 1000

## 2021-11-28 MED ORDER — ENSURE ENLIVE PO LIQD
237.0000 mL | ORAL | Status: DC
  Administered 2021-11-28 – 2021-12-05 (×4): 237 mL via ORAL

## 2021-11-28 MED ORDER — FREE WATER
100.0000 mL | Status: DC
  Administered 2021-11-28 – 2021-12-07 (×53): 100 mL

## 2021-11-28 NOTE — Progress Notes (Signed)
PROGRESS NOTE    Brent Warren  PYK:[id number] DOB: February 12, 1960 DOA: 11/18/2021 PCP: Marcine Matar, MD   Brief Narrative:  61 year old male with history of HTN, tobacco use, chronic alcohol use presented with sudden onset slurred speech and right-sided weakness.  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  He was admitted to the neuro ICU, remained stable and transferred to the hospitalist service on 11/13.  He started to develop increased alcohol withdrawal symptoms on 11/13.     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  LDL 62, A1c is 5.1 -PT/OT recommending CIR.  As per CIR, patient is not a good candidate for CIR.  TOC consulted for possible SNF placement -SLP following.  Currently NPO.  Continue cortrak (placed on 11/22/2021) feeding  Alcohol abuse, alcohol withdrawal with delirium tremens -Patient with significant alcohol withdrawal symptoms.  PCCM consulted and have signed off.  Currently on phenobarbital taper -Vitamin B1 normal -Continue thiamine, folic acid and multivitamin -Intermittently requiring mittens/restraints  Hypertension -Monitor blood pressure.  Continue amlodipine.  Intermittently on the higher side.  Hypokalemia -Improved  Possible aspiration pneumonia Fever Possible UTI -Afebrile over the few days.  Currently on Unasyn, started on 11/24/2021: Finish 5-day course of therapy, last day of therapy today.  Chest x-ray on 11/24/2021 suggested possible interstitial pneumonia. -UA suggestive of UTI as well.  Cultures negative so far  Vitamin B12 deficiency -B12 177.  Folate 12.7.  Supplement B12.  Hyponatremia -Resolved  Macrocytosis -Possibly from alcohol abuse  Elevated LFTs -Possibly from alcohol abuse.  Improving.  Monitor intermittently.  Tobacco  use, history of emphysema -Continue nebs as needed   DVT prophylaxis: Heparin subcutaneous Code Status: Full Family Communication: Spoke to brother/[name] on phone on 11/26/2021 Disposition Plan: Status is: Inpatient Remains inpatient appropriate because: Of severity of illness  Consultants: Neurology/PCCM  Procedures: As above  Antimicrobials: Unasyn from 11/24/2021 onwards   Subjective: Patient seen and examined at bedside.  No agitation, seizures, fever or vomiting reported.  Poor historian.   Objective: Vitals:   11/27/21 1556 11/27/21 2010 11/27/21 2332 11/28/21 0318  BP: (!) 136/103 (!) 146/84 124/84 (!) 146/86  Pulse: (!) 114 95 89 (!) 107  Resp: 20 16 18 16   Temp: 99.6 F (37.6 C) 98.8 F (37.1 C) 98.7 F (37.1 C) 98.7 F (37.1 C)  TempSrc: Oral Oral Oral Oral  SpO2: 100% 100% 99% 100%  Weight:      Height:        Intake/Output Summary (Last 24 hours) at 11/28/2021 0827 Last data filed at 11/28/2021 0500 Gross per 24 hour  Intake 1114.83 ml  Output 800 ml  Net 314.83 ml    Filed Weights   11/25/21 0446 11/26/21 0439 11/27/21 0500  Weight: 56.6 kg 53.1 kg 57.8 kg    Examination:  General: On room air currently.  No distress currently.  Chronically ill and deconditioned looking. ENT/neck: Cortrak tube is still has not.  No palpable neck masses noted respiratory: Decreased breath sounds at bases bilaterally with some crackles CVS: S1-S2 heard; currently rate controlled  abdominal: Soft, nontender, still distended; no organomegaly, bowel sounds are heard  extremities: Trace lower extremity edema present; no cyanosis CNS: Sleepy, wakes up slightly, still confused.  Extremely poor historian.  Still has incomprehensible speech.  No focal neurologic  deficit.  Able to move extremities Lymph: No cervical lymphadenopathy palpable  skin: No obvious ecchymosis/lesions noted  affect psych: Currently not agitated.  Affect is still very flat. Musculoskeletal: No  obvious joint tenderness/erythema/swelling    Data Reviewed: I have personally reviewed following labs and imaging studies  CBC: Recent Labs  Lab 11/23/21 0619 11/24/21 0404 11/25/21 0418  WBC 9.3 9.5 8.6  HGB 14.2 13.5 12.0*  HCT 40.3 38.6* 33.9*  MCV 103.3* 104.0* 104.0*  PLT 209 219 232    Basic Metabolic Panel: Recent Labs  Lab 11/22/21 1700 11/23/21 0619 11/23/21 1816 11/24/21 0404 11/25/21 0418 11/26/21 0433 11/27/21 0327  NA  --  134*  --  141 142 141 142  K  --  3.0*  --  4.1 3.3* 3.7 3.5  CL  --  102  --  113* 110 113* 108  CO2  --  19*  --  20* 20* 19* 24  GLUCOSE  --  96  --  117* 122* 111* 92  BUN  --  14  --  14 14 14 16   CREATININE  --  0.60*  --  0.76 0.80 0.68 0.70  CALCIUM  --  9.3  --  9.3 9.1 9.3 9.6  MG 1.8 1.7 2.6* 2.1  --  1.7 1.7  PHOS 2.8 3.3 3.4 3.4  --   --   --     GFR: Estimated Creatinine Clearance: 78.3 mL/min (by C-G formula based on SCr of 0.7 mg/dL). Liver Function Tests: Recent Labs  Lab 11/26/21 0433  AST 50*  ALT 33  ALKPHOS 90  BILITOT 0.3  PROT 7.0  ALBUMIN 2.8*    No results for input(s): "LIPASE", "AMYLASE" in the last 168 hours. No results for input(s): "AMMONIA" in the last 168 hours. Coagulation Profile: No results for input(s): "INR", "PROTIME" in the last 168 hours.  Cardiac Enzymes: No results for input(s): "CKTOTAL", "CKMB", "CKMBINDEX", "TROPONINI" in the last 168 hours. BNP (last 3 results) No results for input(s): "PROBNP" in the last 8760 hours. HbA1C: No results for input(s): "HGBA1C" in the last 72 hours. CBG: Recent Labs  Lab 11/27/21 1214 11/27/21 1703 11/27/21 2015 11/27/21 2334 11/28/21 0317  GLUCAP 115* 103* 117* 123* 104*    Lipid Profile: No results for input(s): "CHOL", "HDL", "LDLCALC", "TRIG", "CHOLHDL", "LDLDIRECT" in the last 72 hours. Thyroid Function Tests: No results for input(s): "TSH", "T4TOTAL", "FREET4", "T3FREE", "THYROIDAB" in the last 72 hours. Anemia Panel: No  results for input(s): "VITAMINB12", "FOLATE", "FERRITIN", "TIBC", "IRON", "RETICCTPCT" in the last 72 hours. Sepsis Labs: No results for input(s): "PROCALCITON", "LATICACIDVEN" in the last 168 hours.  Recent Results (from the past 240 hour(s))  MRSA Next Gen by PCR, Nasal     Status: None   Collection Time: 11/18/21  1:13 PM   Specimen: Nasal Mucosa; Nasal Swab  Result Value Ref Range Status   MRSA by PCR Next Gen NOT DETECTED NOT DETECTED Final    Comment: (NOTE) The GeneXpert MRSA Assay (FDA approved for NASAL specimens only), is one component of a comprehensive MRSA colonization surveillance program. It is not intended to diagnose MRSA infection nor to guide or monitor treatment for MRSA infections. Test performance is not FDA approved in patients less than 69 years old. Performed at [facility], [address] [place], [place]   Urine Culture     Status: None   Collection Time: 11/25/21 10:32 AM   Specimen: Urine, Clean Catch  Result  Value Ref Range Status   Specimen Description URINE, CLEAN CATCH  Final   Special Requests NONE  Final   Culture   Final    NO GROWTH Performed at [facility], [address], [place], [place] [address]    Report Status 11/26/2021 FINAL  Final  Culture, blood (Routine X 2) w Reflex to ID Panel     Status: None (Preliminary result)   Collection Time: 11/25/21 10:56 AM   Specimen: BLOOD LEFT ARM  Result Value Ref Range Status   Specimen Description BLOOD LEFT ARM  Final   Special Requests   Final    IN PEDIATRIC BOTTLE Blood Culture results may not be optimal due to an inadequate volume of blood received in culture bottles   Culture   Final    NO GROWTH 3 DAYS Performed at [facility], [address], [place], [place] [address]    Report Status PENDING  Incomplete  Culture, blood (Routine X 2) w Reflex to ID Panel     Status: None (Preliminary result)   Collection Time: 11/25/21 11:13 AM   Specimen: BLOOD LEFT  HAND  Result Value Ref Range Status   Specimen Description BLOOD LEFT HAND  Final   Special Requests   Final    BOTTLES DRAWN AEROBIC AND ANAEROBIC Blood Culture adequate volume   Culture   Final    NO GROWTH 3 DAYS Performed at [facility], [address] [place], [place] [address]    Report Status PENDING  Incomplete         Radiology Studies: No results found.      Scheduled Meds:  amLODipine  10 mg Per Tube Daily   vitamin B-12  1,000 mcg Per Tube Daily   feeding supplement (PROSource TF20)  60 mL Per Tube Daily   folic acid  1 mg Per Tube Daily   heparin injection (subcutaneous)  5,000 Units Subcutaneous Q8H   hydrocortisone cream   Topical BID   multivitamin with minerals  1 tablet Per Tube Daily   mouth rinse  15 mL Mouth Rinse 4 times per day   pantoprazole (PROTONIX) IV  40 mg Intravenous QHS   senna-docusate  1 tablet Per Tube BID   sodium chloride flush  3 mL Intravenous Once   thiamine  100 mg Per Tube Daily   Continuous Infusions:  ampicillin-sulbactam (UNASYN) IV 3 g (11/28/21 0331)   feeding supplement (VITAL 1.5 CAL) 1,000 mL (11/27/21 2218)          Glade Lloyd, MD [facility] 11/28/2021, 8:27 AM

## 2021-11-28 NOTE — Progress Notes (Signed)
Physical Therapy Treatment Patient Details Name: Brent Warren MRN: [id number] DOB: 1960-06-11 Today's Date: 11/28/2021   History of Present Illness 61 yo male presenting 11/11 with R-sided weakness, facial droop and slurred speech. 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 PMH includes: HTN, COPD, and alcohol abuse.    PT Comments    Pt with good progress this date, able to progress all mobility with +2 assist throughout. Pt able come to standing with mod assist +2 and demonstrate gait with HHA of 2 for x2 bouts in room. Pt continues to be limited by impaired communication and cognition, however pt with improved alertness this date. Current plan remains appropriate to address deficits and maximize functional independence and decrease caregiver burden. Pt continues to benefit from skilled PT services to progress toward functional mobility goals.    Recommendations for follow up therapy are one component of a multi-disciplinary discharge planning process, led by the attending physician.  Recommendations may be updated based on patient status, additional functional criteria and insurance authorization.  Follow Up Recommendations  Acute inpatient rehab (3hours/day)     Assistance Recommended at Discharge Frequent or constant Supervision/Assistance  Patient can return home with the following A lot of help with walking and/or transfers;A lot of help with bathing/dressing/bathroom;Assistance with cooking/housework;Direct supervision/assist for medications management;Direct supervision/assist for financial management;Assist for transportation;Help with stairs or ramp for entrance   Equipment Recommendations  Other (comment)    Recommendations for Other Services       Precautions / Restrictions Precautions Precautions: Fall Precaution Comments: watch HR Restrictions Weight Bearing Restrictions: No     Mobility  Bed Mobility Overal bed mobility: Needs Assistance Bed Mobility: Supine to  Sit     Supine to sit: Min assist     General bed mobility comments: tactile cue sto get to EOB with assistance to raise trunk    Transfers Overall transfer level: Needs assistance Equipment used: 2 person hand held assist Transfers: Sit to/from Stand, Bed to chair/wheelchair/BSC Sit to Stand: Mod assist, +2 physical assistance   Step pivot transfers: Mod assist, +2 physical assistance       General transfer comment: Stedy used to transfer from EOB to chair to allow patient to stand for bed change.  Mod assist of 2 for mobility and transfers to EOB and to recliner Transfer via Lift Equipment: Stedy  Ambulation/Gait Ambulation/Gait assistance: Mod assist, +2 physical assistance Gait Distance (Feet): 15 Feet (+10) Assistive device: 2 person hand held assist Gait Pattern/deviations: Step-to pattern, Decreased stride length, Ataxic Gait velocity: decer     General Gait Details: slow step to gait with +2 assist to steady and maintain balance, pt need frequent cues for upright posture and assist to advance RLE   Stairs             Wheelchair Mobility    Modified Rankin (Stroke Patients Only) Modified Rankin (Stroke Patients Only) Pre-Morbid Rankin Score: No symptoms Modified Rankin: Moderately severe disability     Balance Overall balance assessment: Needs assistance Sitting-balance support: Single extremity supported, Feet supported Sitting balance-Leahy Scale: Poor Sitting balance - Comments: reliant on UE support   Standing balance support: Bilateral upper extremity supported Standing balance-Leahy Scale: Poor Standing balance comment: reliant on exterenal support for standing with stedy and therapists                            Cognition Arousal/Alertness: Awake/alert Behavior During Therapy: Flat affect, Restless Overall  Cognitive Status: Difficult to assess Area of Impairment: Following commands, Orientation, Attention, Problem solving,  Awareness, Safety/judgement                       Following Commands: Follows one step commands with increased time Safety/Judgement: Decreased awareness of safety, Decreased awareness of deficits     General Comments: difficult to understands, attempts to communicate        Exercises      General Comments        Pertinent Vitals/Pain Pain Assessment Pain Assessment: Faces Faces Pain Scale: No hurt Pain Intervention(s): Monitored during session    Home Living                          Prior Function            PT Goals (current goals can now be found in the care plan section) Acute Rehab PT Goals PT Goal Formulation: With patient Time For Goal Achievement: 12/03/21 Progress towards PT goals: Progressing toward goals    Frequency    Min 3X/week      PT Plan Current plan remains appropriate    Co-evaluation PT/OT/SLP Co-Evaluation/Treatment: Yes Reason for Co-Treatment: Complexity of the patient's impairments (multi-system involvement);For patient/therapist safety;To address functional/ADL transfers PT goals addressed during session: Mobility/safety with mobility;Balance OT goals addressed during session: ADL's and self-care      AM-PAC PT "6 Clicks" Mobility   Outcome Measure  Help needed turning from your back to your side while in a flat bed without using bedrails?: A Lot Help needed moving from lying on your back to sitting on the side of a flat bed without using bedrails?: A Lot Help needed moving to and from a bed to a chair (including a wheelchair)?: Total Help needed standing up from a chair using your arms (e.g., wheelchair or bedside chair)?: Total Help needed to walk in hospital room?: Total Help needed climbing 3-5 steps with a railing? : Total 6 Click Score: 8    End of Session Equipment Utilized During Treatment: Gait belt Activity Tolerance: Patient tolerated treatment well Patient left: in chair;with call bell/phone  within reach;with chair alarm set;with nursing/sitter in room (belt alarm) Nurse Communication: Mobility status PT Visit Diagnosis: Other abnormalities of gait and mobility (R26.89);Muscle weakness (generalized) (M62.81);Hemiplegia and hemiparesis Hemiplegia - Right/Left: Right Hemiplegia - dominant/non-dominant: Dominant Hemiplegia - caused by: Nontraumatic intracerebral hemorrhage     Time: 7353-2992 PT Time Calculation (min) (ACUTE ONLY): 26 min  Charges:  $Gait Training: 8-22 mins                     Virginie Josten R. PTA Acute Rehabilitation Services Office: [phone]    Catalina Antigua 11/28/2021, 1:19 PM

## 2021-11-28 NOTE — Progress Notes (Addendum)
Nutrition Follow-up  DOCUMENTATION CODES:  Severe malnutrition in context of social or environmental circumstances  INTERVENTION:  Ensure Enlive 1x/d 350kcal and 20g protein Adjust TF regimen to the following: Tube feeding via cortrak: Jevity 1.5 at 55 ml/h (1320 ml per day) Free water q4h Prosource TF20 60 ml 1x/d Provides 2060kcal, 104gm protein, 1603 ml free water daily  NUTRITION DIAGNOSIS:  Severe Malnutrition related to social / environmental circumstances as evidenced by severe fat depletion, severe muscle depletion. - remains applicable  GOAL:  Patient will meet greater than or equal to 90% of their needs - progressing, TF infusing at goal  MONITOR:  Diet advancement, TF tolerance, Labs  REASON FOR ASSESSMENT:  Consult Enteral/tube feeding initiation and management  ASSESSMENT:  61 y.o. male with PMH HTN, alcohol abuse, and COPD who presented after ICH.  11/13 SLP rec'd NPO 11/15 Cortrak placed, starting tube feeds 11/21 - MBS, DYS2, thins  Pt resting in bed at the time of assessment. Lunch tray in front of him with ~5 bites consumed. Pt's speech is garbled and hard to understand, but he did say he was done eating and also asked for a cigarette.   Encouraged pt to work on his PO so that feeds could be adjusted to nocturnal and eventually stopped. Pt nodded. Will add ensure 1x/d and monitor intake.    Noted no BM since 11/13, discussed with RN. Reports he will ask MD to review current bowel regimen and need to escalate.   Nutritionally Relevant Medications: Scheduled Meds:  vitamin B-12  1,000 mcg Per Tube Daily   PROSource TF20  60 mL Per Tube Daily   folic acid  1 mg Per Tube Daily   multivitamin with minerals  1 tablet Per Tube Daily   pantoprazole IV  40 mg Intravenous QHS   senna-docusate  1 tablet Per Tube BID   thiamine  100 mg Per Tube Daily   Continuous Infusions:  ampicillin-sulbactam (UNASYN) IV 3 g (11/28/21 0331)   feeding supplement  (VITAL 1.5 CAL) 1,000 mL (11/27/21 2218)   Labs Reviewed  NUTRITION - FOCUSED PHYSICAL EXAM: Flowsheet Row Most Recent Value  Orbital Region Severe depletion  Upper Arm Region Severe depletion  Thoracic and Lumbar Region Severe depletion  Buccal Region Moderate depletion  Temple Region Severe depletion  Clavicle Bone Region Severe depletion  Clavicle and Acromion Bone Region Severe depletion  Scapular Bone Region Unable to assess  Dorsal Hand Unable to assess  Patellar Region Severe depletion  Anterior Thigh Region Severe depletion  Posterior Calf Region Severe depletion  Edema (RD Assessment) None  Hair Reviewed  Eyes Unable to assess  Mouth Unable to assess  Skin Unable to assess  Nails Reviewed   Diet Order:   Diet Order             DIET DYS 2 Room service appropriate? No; Fluid consistency: Thin  Diet effective now                   EDUCATION NEEDS:  Not appropriate for education at this time  Skin:  Skin Assessment: Reviewed RN Assessment  Last BM:  11/13  Height:  Ht Readings from Last 1 Encounters:  11/22/21 5\' 9"  (1.753 m)    Weight:  Wt Readings from Last 1 Encounters:  11/27/21 57.8 kg    Ideal Body Weight:  72.7 kg  BMI:  Body mass index is 18.82 kg/m.  Estimated Nutritional Needs:  Kcal:  1900-2100 kcal/d Protein:  90-110 g/d Fluid:  1.9-2.1 L/d    Greig Castilla, RD, LDN Clinical Dietitian RD pager # available in [facility]  After hours/weekend pager # available in [facility]

## 2021-11-28 NOTE — Plan of Care (Signed)
  Problem: Education: Goal: Knowledge of disease or condition will improve Outcome: Progressing Goal: Knowledge of secondary prevention will improve (MUST DOCUMENT ALL) Outcome: Progressing Goal: Knowledge of patient specific risk factors will improve Loraine Leriche N/A or DELETE if not current risk factor) Outcome: Progressing   Problem: Intracerebral Hemorrhage Tissue Perfusion: Goal: Complications of Intracerebral Hemorrhage will be minimized Outcome: Progressing   Problem: Nutrition: Goal: Risk of aspiration will decrease Outcome: Progressing Goal: Dietary intake will improve Outcome: Progressing   Problem: Safety: Goal: Non-violent Restraint(s) Outcome: Progressing

## 2021-11-28 NOTE — Progress Notes (Signed)
Speech Language Pathology Treatment: Cognitive-Linquistic  Patient Details Name: Brent Warren MRN: [id number] DOB: 1960/11/01 Today's Date: 11/28/2021 Time: 6834-1962 SLP Time Calculation (min) (ACUTE ONLY): 25 min  Assessment / Plan / Recommendation Clinical Impression  Pt making significant gains in attention and awareness today. SLP used verbal reasoning and visual feedback to improve pts safety awareness during transfers in radiology suite. Pt verbalized understanding of safety with cortrak and was able to express anticipatory awareness about danger of trying to get out of bed. When speaking pt is becoming more and more intelligible at conversation level, about 25% intelligible. When given a clarification cue pt is able to over-articulate to improve listener comprehension somewhat. Pt continues to be labile, question pseudobulbar affect/palsy. Pt is able to move tongue and, as MBS showed, use tongue for chewing and swallowing. However his dysarthria appears more severe than his ROM would suggest. Will continue efforts to address intelligibility with compensation and increased pt awareness.   HPI HPI: 61 year old male with history of HTN, tobacco use, EtOH use who comes into the hospital with sudden onset slurred speech and right-sided weakness.  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   He started to develop increased withdrawal symptoms on 11/13      SLP Plan         Recommendations for follow up therapy are one component of a multi-disciplinary discharge planning process, led by the attending physician.  Recommendations may be updated based on patient status, additional functional criteria and insurance authorization.    Recommendations                   General recommendations: Rehab consult Oral Care Recommendations: Oral care BID Follow Up Recommendations: Acute inpatient rehab (3hours/day) Assistance recommended at discharge: Frequent or constant  Supervision/Assistance SLP Visit Diagnosis: Dysphagia, unspecified (R13.10)           [name], Riley Nearing  11/28/2021, 12:17 PM

## 2021-11-28 NOTE — Progress Notes (Signed)
Occupational Therapy Treatment Patient Details Name: Brent Warren MRN: [id number] DOB: [birth year] Today's Date: 11/28/2021   History of present illness 61 yo male presenting 11/11 with R-sided weakness, facial droop and slurred speech. 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 PMH includes: HTN, COPD, and alcohol abuse.   OT comments  Patient received in supine and indicated he was agreeable to OT/PT session. Patient assisted to EOB and his bed was found to be wet. Patient stood into [place] to allow for bed change and demonstrated improved standing balance and was transferred to chair following gown change. Patient performed mobility and transfer from chair to EOB and from EOB to recliner with mod assist x2 with HHA, requiring occasional assist to advance RLE and cues for posture. Patient is making good gains and would benefit from further OT to increase safety with mobility and transfers. Acute OT to continue to follow.    Recommendations for follow up therapy are one component of a multi-disciplinary discharge planning process, led by the attending physician.  Recommendations may be updated based on patient status, additional functional criteria and insurance authorization.    Follow Up Recommendations  Skilled nursing-short term rehab (<3 hours/day)     Assistance Recommended at Discharge Frequent or constant Supervision/Assistance  Patient can return home with the following  Two people to help with walking and/or transfers;Two people to help with bathing/dressing/bathroom;Direct supervision/assist for medications management;Direct supervision/assist for financial management;Assistance with feeding;Assistance with cooking/housework;Assist for transportation;Help with stairs or ramp for entrance   Equipment Recommendations  BSC/3in1;Wheelchair (measurements OT);Wheelchair cushion (measurements OT)    Recommendations for Other Services      Precautions / Restrictions  Precautions Precautions: Fall Precaution Comments: watch HR Restrictions Weight Bearing Restrictions: No       Mobility Bed Mobility Overal bed mobility: Needs Assistance Bed Mobility: Supine to Sit     Supine to sit: Min assist     General bed mobility comments: tactile cue sto get to EOB with assistance to raise trunk    Transfers Overall transfer level: Needs assistance Equipment used: 2 person hand held assist Transfers: Sit to/from Stand, Bed to chair/wheelchair/BSC Sit to Stand: Mod assist, +2 physical assistance     Step pivot transfers: Mod assist, +2 physical assistance     General transfer comment: Stedy used to transfer from EOB to chair to allow patient to stand for bed change.  Mod assist of 2 for mobility and transfers to EOB and to recliner Transfer via Lift Equipment: Stedy   Balance Overall balance assessment: Needs assistance Sitting-balance support: Single extremity supported, Feet supported Sitting balance-Leahy Scale: Poor Sitting balance - Comments: reliant on UE support   Standing balance support: Bilateral upper extremity supported Standing balance-Leahy Scale: Poor Standing balance comment: reliant on exterenal support for standing with stedy and therapists                           ADL either performed or assessed with clinical judgement   ADL Overall ADL's : Needs assistance/impaired                 Upper Body Dressing : Moderate assistance Upper Body Dressing Details (indicate cue type and reason): to change gown                   General ADL Comments: focused on functional transfers    Extremity/Trunk Assessment Upper Extremity Assessment RUE Deficits / Details: no movemetnnoted however increased  flexor tone RUE Sensation: decreased light touch RUE Coordination: decreased fine motor;decreased gross motor LUE Deficits / Details: gross movemetns however unable to use funcitonally at this time             Vision       Perception     Praxis      Cognition Arousal/Alertness: Awake/alert Behavior During Therapy: Flat affect, Restless Overall Cognitive Status: Difficult to assess Area of Impairment: Following commands, Orientation, Attention, Problem solving, Awareness, Safety/judgement                       Following Commands: Follows one step commands with increased time Safety/Judgement: Decreased awareness of safety, Decreased awareness of deficits     General Comments: difficult to understands, attempts to communicate        Exercises      Shoulder Instructions       General Comments      Pertinent Vitals/ Pain       Pain Assessment Pain Assessment: Faces Pain Score: 0-No pain Pain Intervention(s): Monitored during session  Home Living                                          Prior Functioning/Environment              Frequency  Min 2X/week        Progress Toward Goals  OT Goals(current goals can now be found in the care plan section)  Progress towards OT goals: Progressing toward goals  Acute Rehab OT Goals OT Goal Formulation: Patient unable to participate in goal setting Time For Goal Achievement: 12/04/21 Potential to Achieve Goals: Fair ADL Goals Pt Will Perform Grooming: with min assist;sitting Pt Will Perform Upper Body Bathing: with min assist;sitting Pt Will Transfer to Toilet: with +2 assist;with min assist;stand pivot transfer;bedside commode Additional ADL Goal #1: pt will complete basic transfer total +2 min (A) as precursor to adls  Plan Discharge plan remains appropriate    Co-evaluation    PT/OT/SLP Co-Evaluation/Treatment: Yes Reason for Co-Treatment: Complexity of the patient's impairments (multi-system involvement);For patient/therapist safety;To address functional/ADL transfers   OT goals addressed during session: ADL's and self-care      AM-PAC OT "6 Clicks" Daily Activity     Outcome  Measure   Help from another person eating meals?: Total Help from another person taking care of personal grooming?: Total Help from another person toileting, which includes using toliet, bedpan, or urinal?: Total Help from another person bathing (including washing, rinsing, drying)?: Total Help from another person to put on and taking off regular upper body clothing?: A Lot Help from another person to put on and taking off regular lower body clothing?: Total 6 Click Score: 7    End of Session Equipment Utilized During Treatment: Gait belt;Other (comment) Antony Salmon)  OT Visit Diagnosis: Unsteadiness on feet (R26.81);Muscle weakness (generalized) (M62.81);Hemiplegia and hemiparesis;Low vision, both eyes (H54.2);Other symptoms and signs involving cognitive function;Other symptoms and signs involving the nervous system (R29.898) Hemiplegia - Right/Left: Right Hemiplegia - dominant/non-dominant: Dominant Hemiplegia - caused by: Other Nontraumatic intracranial hemorrhage   Activity Tolerance Patient tolerated treatment well   Patient Left in chair;with call bell/phone within reach;Other (comment) (Posey alarm belt)   Nurse Communication Mobility status        Time: 2119-4174 OT Time Calculation (min): 24 min  Charges: OT General Charges $OT  Visit: 1 Visit OT Treatments $Self Care/Home Management : 8-22 mins  Alfonse Flavors, OTA Acute Rehabilitation Services  Office [phone]   Dewain Penning 11/28/2021, 1:10 PM

## 2021-11-28 NOTE — Progress Notes (Signed)
Modified Barium Swallow Progress Note  Patient Details  Name: Boss Danielsen MRN: [id number] Date of Birth: [birth year]  Today's Date: 11/28/2021  Modified Barium Swallow completed.  Full report located under Chart Review in the Imaging Section.  Brief recommendations include the following:  Clinical Impression  Pt demonstrates mild oral dysphagia with slow lingual mobility, but adequate to clear most of the bolus from the oral cavity. Pt does have decreased bolus cohesion, contributing to premature spillage and delayed swallowing present with all trials. No penetration or aspiration occurred. No residue, no significant weakness. 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 Thin liquids shold be tolerated if pt is sitting upright. Pills can be given whole in puree. Will f/u for tolerance.   Swallow Evaluation Recommendations       SLP Diet Recommendations: Dysphagia 2 (Fine chop) solids;Thin liquid   Liquid Administration via: Cup;Straw   Medication Administration: Whole meds with puree   Supervision: Staff to assist with self feeding   Compensations: Slow rate;Small sips/bites   Postural Changes: Remain semi-upright after after feeds/meals (Comment);Seated upright at 90 degrees   Oral Care Recommendations: Oral care BID        Alvar Malinoski, Riley Nearing 11/28/2021,1:05 PM

## 2021-11-29 DIAGNOSIS — I10 Essential (primary) hypertension: Secondary | ICD-10-CM | POA: Diagnosis not present

## 2021-11-29 DIAGNOSIS — I471 Supraventricular tachycardia, unspecified: Secondary | ICD-10-CM | POA: Diagnosis not present

## 2021-11-29 DIAGNOSIS — R1312 Dysphagia, oropharyngeal phase: Secondary | ICD-10-CM | POA: Diagnosis not present

## 2021-11-29 DIAGNOSIS — I610 Nontraumatic intracerebral hemorrhage in hemisphere, subcortical: Secondary | ICD-10-CM | POA: Diagnosis not present

## 2021-11-29 LAB — BASIC METABOLIC PANEL
Anion gap: 17 — ABNORMAL HIGH (ref 5–15)
BUN: 16 mg/dL (ref 8–23)
CO2: 23 mmol/L (ref 22–32)
Calcium: 9.7 mg/dL (ref 8.9–10.3)
Chloride: 103 mmol/L (ref 98–111)
Creatinine, Ser: 0.72 mg/dL (ref 0.61–1.24)
GFR, Estimated: >60 mL/min (ref >60)
Glucose, Bld: 125 mg/dL — ABNORMAL HIGH (ref 70–99)
Potassium: 3.6 mmol/L (ref 3.5–5.1)
Sodium: 143 mmol/L (ref 135–145)

## 2021-11-29 LAB — GLUCOSE, CAPILLARY
Glucose-Capillary: 107 mg/dL — ABNORMAL HIGH (ref 70–99)
Glucose-Capillary: 125 mg/dL — ABNORMAL HIGH (ref 70–99)
Glucose-Capillary: 127 mg/dL — ABNORMAL HIGH (ref 70–99)
Glucose-Capillary: 102 mg/dL — ABNORMAL HIGH (ref 70–99)
Glucose-Capillary: 117 mg/dL — ABNORMAL HIGH (ref 70–99)

## 2021-11-29 LAB — MAGNESIUM: Magnesium: 1.8 mg/dL (ref 1.7–2.4)

## 2021-11-29 MED ORDER — MAGNESIUM SULFATE 2 GM/50ML IV SOLN
2.0000 g | Freq: Once | INTRAVENOUS | Status: AC
  Administered 2021-11-29: 2 g via INTRAVENOUS
  Filled 2021-11-29: qty 50

## 2021-11-29 MED ORDER — METOPROLOL TARTRATE 25 MG PO TABS
25.0000 mg | ORAL_TABLET | Freq: Two times a day (BID) | ORAL | Status: DC
  Administered 2021-11-29 – 2021-11-30 (×4): 25 mg
  Filled 2021-11-29 (×4): qty 1

## 2021-11-29 NOTE — Progress Notes (Signed)
[facility] PROGRESS NOTE   Brent Warren EYC:[id number] DOB: 17-Jun-1960 DOA: 11/18/2021  PCP: Marcine Matar, MD  Brief History/Interval Summary: 61 year old male with history of HTN, tobacco use, chronic alcohol use presented with sudden onset slurred speech and right-sided weakness.  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  He was admitted to the neuro ICU, remained stable and transferred to the hospitalist service on 11/13.  Patient also developed alcohol withdrawal during this hospitalization.  Consultants: Neurology.  Critical care medicine.     Subjective/Interval History: Patient noted to be pleasantly confused.    Assessment/Plan:  Left basal ganglia ICH -Neurology was following and signed off on 11/24/2021 and recommended outpatient follow-up with neurology -Echo showed EF of 60 to 65%.  LDL 62, A1c is 5.1 -PT/OT recommending CIR.  As per CIR, patient is not a good candidate for CIR.  TOC consulted for possible SNF placement  Oropharyngeal dysphagia -SLP following.  Currently NPO.  Continue cortrak (placed on 11/22/2021).  Seen by speech therapy yesterday and dysphagia 2 diet is recommended.  Follow aspiration precautions. If patient's caloric intake is adequate then we may be able to discontinue the core track in the next few days. Will need assistance with feeding.   Alcohol abuse, alcohol withdrawal with delirium tremens -Patient with significant alcohol withdrawal symptoms.  PCCM consulted and have signed off.  Completed phenobarbital taper. -Vitamin B1 normal -Continue thiamine, folic acid and multivitamin -Intermittently requiring mittens/restraints   Essential hypertension Noted to be on amlodipine.  Blood pressure not quite at goal. Started on beta-blocker today.  SVT Telemetry showed brief episodes of supraventricular tachycardia. Echocardiogram shows normal systolic function.  No TSH has been checked recently.  Will order  for tomorrow. Started on low-dose beta-blocker.   Hypokalemia -Improved   Possible aspiration pneumonia/Possible UTI Was started on Unasyn on 11/17.  Completed 5 days of treatment.  Respiratory status noted to be stable.  He is noted to be afebrile. -UA suggestive of UTI as well.  Cultures negative so far   Vitamin B12 deficiency -B12 177.  Folate 12.7.  Supplement B12.   Hyponatremia -Resolved   Macrocytosis -Possibly from alcohol abuse   Elevated LFTs -Possibly from alcohol abuse.  Improving.  Monitor intermittently.   Tobacco use, history of emphysema -Continue nebs as needed  Severe protein calorie malnutrition Nutrition Problem: Severe Malnutrition Etiology: social / environmental circumstances Signs/Symptoms: severe fat depletion, severe muscle depletion   DVT Prophylaxis: Subcutaneous heparin Code Status: Full code Family Communication: No family at bedside Disposition Plan: SNF  Status is: Inpatient Remains inpatient appropriate because: Intracranial hemorrhage, dysphagia      Medications: Scheduled:  amLODipine  10 mg Per Tube Daily   vitamin B-12  1,000 mcg Per Tube Daily   feeding supplement  237 mL Oral Q24H   feeding supplement (PROSource TF20)  60 mL Per Tube Daily   folic acid  1 mg Per Tube Daily   free water  100 mL Per Tube Q4H   heparin injection (subcutaneous)  5,000 Units Subcutaneous Q8H   hydrocortisone cream   Topical BID   metoprolol tartrate  25 mg Per Tube BID   multivitamin with minerals  1 tablet Per Tube Daily   mouth rinse  15 mL Mouth Rinse 4 times per day   pantoprazole (PROTONIX) IV  40 mg Intravenous QHS   senna-docusate  1 tablet Per Tube BID   sodium chloride flush  3 mL Intravenous Once  thiamine  100 mg Per Tube Daily   Continuous:  feeding supplement (JEVITY 1.5 CAL/FIBER) 1,000 mL (11/29/21 0351)   magnesium sulfate bolus IVPB 2 g (11/29/21 1038)   ZOX:[id number] **OR** acetaminophen (TYLENOL) oral liquid 160  mg/5 mL **OR** acetaminophen, ipratropium-albuterol, labetalol, LORazepam, mouth rinse  Antibiotics: Anti-infectives (From admission, onward)    Start     Dose/Rate Route Frequency Ordered Stop   11/24/21 1530  Ampicillin-Sulbactam (UNASYN) 3 g in sodium chloride 0.9 % 100 mL IVPB        3 g 200 mL/hr over 30 Minutes Intravenous Every 6 hours 11/24/21 1437 11/28/21 2110       Objective:  Vital Signs  Vitals:   11/28/21 2327 11/29/21 0353 11/29/21 0357 11/29/21 0737  BP: (!) 141/87  (!) 166/78 (!) 160/93  Pulse: 91  70 96  Resp: 16  18 18   Temp: 98.6 F (37 C)  98.2 F (36.8 C) 98 F (36.7 C)  TempSrc: Oral  Oral Oral  SpO2: 98%  97% 99%  Weight:  55.4 kg    Height:        Intake/Output Summary (Last 24 hours) at 11/29/2021 1052 Last data filed at 11/29/2021 0500 Gross per 24 hour  Intake 700 ml  Output 300 ml  Net 400 ml   Filed Weights   11/26/21 0439 11/27/21 0500 11/29/21 0353  Weight: 53.1 kg 57.8 kg 55.4 kg    General appearance: Awake alert.  In no distress.  Distracted Resp: Clear to auscultation bilaterally.  Normal effort Cardio: S1-S2 is normal regular.  No S3-S4.  No rubs murmurs or bruit GI: Abdomen is soft.  Nontender nondistended.  Bowel sounds are present normal.  No masses organomegaly Extremities: No edema.   Neurologic: Alert and oriented x3.  Right-sided weakness is noted   Lab Results:  Data Reviewed: I have personally reviewed following labs and reports of the imaging studies  CBC: Recent Labs  Lab 11/23/21 0619 11/24/21 0404 11/25/21 0418  WBC 9.3 9.5 8.6  HGB 14.2 13.5 12.0*  HCT 40.3 38.6* 33.9*  MCV 103.3* 104.0* 104.0*  PLT 209 219 232    Basic Metabolic Panel: Recent Labs  Lab 11/22/21 1700 11/22/21 1700 11/23/21 0619 11/23/21 1816 11/24/21 0404 11/25/21 0418 11/26/21 0433 11/27/21 0327 11/29/21 0710  NA  --    < > 134*  --  141 142 141 142 143  K  --    < > 3.0*  --  4.1 3.3* 3.7 3.5 3.6  CL  --    < > 102   --  113* 110 113* 108 103  CO2  --    < > 19*  --  20* 20* 19* 24 23  GLUCOSE  --    < > 96  --  117* 122* 111* 92 125*  BUN  --    < > 14  --  14 14 14 16 16   CREATININE  --    < > 0.60*  --  0.76 0.80 0.68 0.70 0.72  CALCIUM  --    < > 9.3  --  9.3 9.1 9.3 9.6 9.7  MG 1.8  --  1.7 2.6* 2.1  --  1.7 1.7 1.8  PHOS 2.8  --  3.3 3.4 3.4  --   --   --   --    < > = values in this interval not displayed.    GFR: Estimated Creatinine Clearance: 75 mL/min (by C-G formula based on  SCr of 0.72 mg/dL).  Liver Function Tests: Recent Labs  Lab 11/26/21 0433  AST 50*  ALT 33  ALKPHOS 90  BILITOT 0.3  PROT 7.0  ALBUMIN 2.8*     CBG: Recent Labs  Lab 11/28/21 1638 11/28/21 2100 11/28/21 2330 11/29/21 0357 11/29/21 0819  GLUCAP 115* 112* 144* 107* 125*      Recent Results (from the past 240 hour(s))  Urine Culture     Status: None   Collection Time: 11/25/21 10:32 AM   Specimen: Urine, Clean Catch  Result Value Ref Range Status   Specimen Description URINE, CLEAN CATCH  Final   Special Requests NONE  Final   Culture   Final    NO GROWTH Performed at [facility], [address] [place], [place] [address]    Report Status 11/26/2021 FINAL  Final  Culture, blood (Routine X 2) w Reflex to ID Panel     Status: None (Preliminary result)   Collection Time: 11/25/21 10:56 AM   Specimen: BLOOD LEFT ARM  Result Value Ref Range Status   Specimen Description BLOOD LEFT ARM  Final   Special Requests   Final    IN PEDIATRIC BOTTLE Blood Culture results may not be optimal due to an inadequate volume of blood received in culture bottles   Culture   Final    NO GROWTH 4 DAYS Performed at [facility], [address] [place], [place] [address]    Report Status PENDING  Incomplete  Culture, blood (Routine X 2) w Reflex to ID Panel     Status: None (Preliminary result)   Collection Time: 11/25/21 11:13 AM   Specimen: BLOOD LEFT HAND  Result Value Ref Range Status    Specimen Description BLOOD LEFT HAND  Final   Special Requests   Final    BOTTLES DRAWN AEROBIC AND ANAEROBIC Blood Culture adequate volume   Culture   Final    NO GROWTH 4 DAYS Performed at [facility], [address], [place], [place] [address]    Report Status PENDING  Incomplete      Radiology Studies: DG Swallowing Func-Speech Pathology  Result Date: 11/28/2021 Table formatting from the original result was not included. Images from the original result were not included. 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  ETOH abuse 11/06/2016  Hepatitis 05/22/2015  HTN (hypertension)   Immunization due 11/06/2016  Lung nodule, multiple 11/08/2015  Malnutrition of moderate degree 06/02/2015  Pneumonia 05/2015  Pulmonary emphysema (HCC) 11/06/2016  Tobacco abuse   Urge incontinence 11/06/2016 Past Surgical History: Past Surgical History: Procedure Laterality Date  arm surgery    DECORTICATION Right 06/03/2015  Procedure: DECORTICATION;  Surgeon: [name], MD;  Location: [facility] OR;  Service: Thoracic;  Laterality: Right;  VIDEO ASSISTED THORACOSCOPY (VATS)/EMPYEMA Right 06/03/2015  Procedure: VIDEO ASSISTED THORACOSCOPY (VATS)/EMPYEMA;  Surgeon: [name], MD;  Location: [facility] OR;  Service: Thoracic;  Laterality: Right; HPI: 61 year old male with history of HTN, tobacco use, EtOH use who comes into the hospital with sudden onset slurred speech and right-sided  weakness.  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   He started to  develop increased withdrawal symptoms on 11/13  No data recorded  Recommendations for follow up therapy are one component of a multi-disciplinary discharge planning process, led by the attending physician.  Recommendations may be updated based on patient status, additional functional criteria and insurance authorization. Assessment / Plan / Recommendation   11/28/2021  12:30 PM Clinical Impressions Clinical Impression Pt demonstrates mild oral dysphagia with slow lingual mobility, but adequate to clear most of the bolus from the oral cavity. Pt does have decreased bolus cohesion, contributing to premature spillage and delayed swallowing present with all trials. No penetration or aspiration occurred. No residue, no significant weakness. 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 Thin liquids shold be toelrated if pt is sitting upright. Pills can be given whole in puree. Will f/u for tolerance.     11/28/2021  12:30 PM Treatment Recommendations Treatment Recommendations Therapy as outlined in treatment plan below     11/28/2021  12:30 PM Prognosis Prognosis for Safe Diet Advancement Good   11/28/2021  12:30 PM Diet Recommendations SLP Diet Recommendations Dysphagia 2 (Fine chop) solids;Thin liquid Liquid Administration via Cup;Straw Medication Administration Whole meds with puree Compensations Slow rate;Small sips/bites Postural Changes Remain semi-upright after after feeds/meals (Comment);Seated upright at 90 degrees     11/28/2021  12:30 PM Other Recommendations Oral Care Recommendations Oral care BID Follow Up Recommendations Acute inpatient rehab (3hours/day)   11/28/2021  12:30 PM Frequency and Duration  Speech Therapy Frequency (ACUTE ONLY) min 2x/week Treatment Duration 2 weeks     11/28/2021  12:30 PM Oral Phase Oral Phase Impaired Oral - Nectar Cup Decreased bolus cohesion Oral - Nectar Straw Decreased bolus cohesion Oral - Thin Straw  Decreased bolus cohesion Oral - Mech Soft Decreased bolus cohesion Oral - Pill [facility]    11/28/2021  12:30 PM Pharyngeal Phase Pharyngeal Phase Impaired Pharyngeal- Nectar Cup Delayed swallow initiation-pyriform sinuses Pharyngeal- Nectar Straw Delayed swallow initiation-pyriform sinuses Pharyngeal- Thin Straw Delayed swallow initiation-pyriform sinuses Pharyngeal- Puree Delayed swallow initiation-vallecula Pharyngeal- Mechanical Soft Delayed swallow initiation-vallecula     No data to display    Warren, Brent NearingBonnie Caroline 11/28/2021, 1:06 PM                         LOS: 11 days   Brent Warren Foot LockerKrishnan  [facility] Pager on [email or web address]  11/29/2021, 10:52 AM

## 2021-11-29 NOTE — Progress Notes (Signed)
Speech Language Pathology Treatment: Dysphagia  Patient Details Name: Brent Warren MRN: [id number] DOB: [birth year] Today's Date: 11/29/2021 Time: 6283-6629 SLP Time Calculation (min) (ACUTE ONLY): 15 min  Assessment / Plan / Recommendation Clinical Impression  Patient seen by SLP for skilled treatment focused on dysphagia goals. Patient was awake and alert, lying sideways in bed with feet dangling over siderail. SLP able to cue and assist patient to move to center of bed and reposition for safe PO intake. Patient's speech remains significantly dysarthric but SLP able to understand less than 25% of what he was saying. He was able to clearly request "apple juice" and inconsistently would produce some intelligible phrases. At times, patient vocalizing with variations in intonation but without any movement of articulators (tongue, lips, etc). When SLP asked him to slow down and speak louder, he did seem to attempt this but there was no observed improvement in speech intelligibility. Patient was able to hold cup of juice and drink via straw sips. No overt s/s aspiration or penetration observed during or after PO intake. Of note, patient exhibiting some awareness to deficits, such as taking blanket off of right arm, picking it up with his left hand and letting it drop back down, seeming to indicate it is not functional. He did become emotionally labile after this with crying, however this rather quickly ceased. SLP will continue to follow patient for speech, cognitive and swallow function goals.    HPI HPI: 61 year old male with history of HTN, tobacco use, EtOH use who comes into the hospital with sudden onset slurred speech and right-sided weakness.  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   He started to develop increased withdrawal symptoms on 11/13      SLP Plan  Continue with current plan of care      Recommendations for follow up therapy are one component of a multi-disciplinary  discharge planning process, led by the attending physician.  Recommendations may be updated based on patient status, additional functional criteria and insurance authorization.    Recommendations  Diet recommendations: Dysphagia 2 (fine chop);Thin liquid Liquids provided via: Straw;Cup Medication Administration: Whole meds with puree Supervision: Full supervision/cueing for compensatory strategies;Staff to assist with self feeding Compensations: Small sips/bites;Slow rate;Minimize environmental distractions Postural Changes and/or Swallow Maneuvers: Seated upright 90 degrees                Oral Care Recommendations: Oral care BID Follow Up Recommendations: Acute inpatient rehab (3hours/day) Assistance recommended at discharge: Frequent or constant Supervision/Assistance SLP Visit Diagnosis: Dysphagia, unspecified (R13.10) Plan: Continue with current plan of care           Angela Nevin, MA, CCC-SLP Speech Therapy

## 2021-11-30 DIAGNOSIS — I610 Nontraumatic intracerebral hemorrhage in hemisphere, subcortical: Secondary | ICD-10-CM | POA: Diagnosis not present

## 2021-11-30 DIAGNOSIS — R451 Restlessness and agitation: Secondary | ICD-10-CM | POA: Diagnosis not present

## 2021-11-30 DIAGNOSIS — R1312 Dysphagia, oropharyngeal phase: Secondary | ICD-10-CM | POA: Diagnosis not present

## 2021-11-30 DIAGNOSIS — I10 Essential (primary) hypertension: Secondary | ICD-10-CM | POA: Diagnosis not present

## 2021-11-30 DIAGNOSIS — R1084 Generalized abdominal pain: Secondary | ICD-10-CM | POA: Diagnosis not present

## 2021-11-30 DIAGNOSIS — I471 Supraventricular tachycardia, unspecified: Secondary | ICD-10-CM | POA: Diagnosis not present

## 2021-11-30 LAB — GLUCOSE, CAPILLARY
Glucose-Capillary: 111 mg/dL — ABNORMAL HIGH (ref 70–99)
Glucose-Capillary: 112 mg/dL — ABNORMAL HIGH (ref 70–99)
Glucose-Capillary: 114 mg/dL — ABNORMAL HIGH (ref 70–99)
Glucose-Capillary: 115 mg/dL — ABNORMAL HIGH (ref 70–99)
Glucose-Capillary: 122 mg/dL — ABNORMAL HIGH (ref 70–99)
Glucose-Capillary: 125 mg/dL — ABNORMAL HIGH (ref 70–99)
Glucose-Capillary: 126 mg/dL — ABNORMAL HIGH (ref 70–99)
Glucose-Capillary: 131 mg/dL — ABNORMAL HIGH (ref 70–99)

## 2021-11-30 LAB — COMPREHENSIVE METABOLIC PANEL
ALT: 70 U/L — ABNORMAL HIGH (ref 0–44)
AST: 92 U/L — ABNORMAL HIGH (ref 15–41)
Albumin: 3.1 g/dL — ABNORMAL LOW (ref 3.5–5.0)
Alkaline Phosphatase: 126 U/L (ref 38–126)
Anion gap: 14 (ref 5–15)
BUN: 14 mg/dL (ref 8–23)
CO2: 21 mmol/L — ABNORMAL LOW (ref 22–32)
Calcium: 9.2 mg/dL (ref 8.9–10.3)
Chloride: 104 mmol/L (ref 98–111)
Creatinine, Ser: 0.71 mg/dL (ref 0.61–1.24)
GFR, Estimated: >60 mL/min (ref >60)
Glucose, Bld: 106 mg/dL — ABNORMAL HIGH (ref 70–99)
Potassium: 3.6 mmol/L (ref 3.5–5.1)
Sodium: 139 mmol/L (ref 135–145)
Total Bilirubin: 0.3 mg/dL (ref 0.3–1.2)
Total Protein: 7.5 g/dL (ref 6.5–8.1)

## 2021-11-30 LAB — CBC
HCT: 36.3 % — ABNORMAL LOW (ref 39.0–52.0)
Hemoglobin: 12.5 g/dL — ABNORMAL LOW (ref 13.0–17.0)
MCH: 36.4 pg — ABNORMAL HIGH (ref 26.0–34.0)
MCHC: 34.4 g/dL (ref 30.0–36.0)
MCV: 105.8 fL — ABNORMAL HIGH (ref 80.0–100.0)
Platelets: 352 10*3/uL (ref 150–400)
RBC: 3.43 MIL/uL — ABNORMAL LOW (ref 4.22–5.81)
RDW: 12.9 % (ref 11.5–15.5)
WBC: 10.8 10*3/uL — ABNORMAL HIGH (ref 4.0–10.5)
nRBC: 0 % (ref 0.0–0.2)

## 2021-11-30 LAB — MAGNESIUM: Magnesium: 2 mg/dL (ref 1.7–2.4)

## 2021-11-30 LAB — CULTURE, BLOOD (ROUTINE X 2)
Culture: NO GROWTH
Culture: NO GROWTH
Special Requests: ADEQUATE

## 2021-11-30 LAB — TSH: TSH: 5.004 u[IU]/mL — ABNORMAL HIGH (ref 0.350–4.500)

## 2021-11-30 LAB — T4, FREE: Free T4: 0.77 ng/dL (ref 0.61–1.12)

## 2021-11-30 LAB — PHOSPHORUS: Phosphorus: 4.8 mg/dL — ABNORMAL HIGH (ref 2.5–4.6)

## 2021-11-30 NOTE — Plan of Care (Signed)
  Problem: Intracerebral Hemorrhage Tissue Perfusion: Goal: Complications of Intracerebral Hemorrhage will be minimized Outcome: Progressing   Problem: Coping: Goal: Will verbalize positive feelings about self Outcome: Progressing   Problem: Health Behavior/Discharge Planning: Goal: Ability to manage health-related needs will improve Outcome: Progressing   Problem: Nutrition: Goal: Risk of aspiration will decrease Outcome: Progressing Goal: Dietary intake will improve Outcome: Progressing

## 2021-11-30 NOTE — Progress Notes (Signed)
[facility] Raphael Gibney was notified via chat that the patient has removed IV removed he cardiac  leads several times.Made several attempts to get out of bed. I have tried Ativan and mittens but it is not effective.

## 2021-11-30 NOTE — Progress Notes (Addendum)
[facility] PROGRESS NOTE   Brent Warren JJO:[id number] DOB: 1961-01-06 DOA: 11/18/2021  PCP: Marcine Matar, MD  Brief History/Interval Summary: 61 year old male with history of HTN, tobacco use, chronic alcohol use presented with sudden onset slurred speech and right-sided weakness.  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  He was admitted to the neuro ICU, remained stable and transferred to the hospitalist service on 11/13.  Patient also developed alcohol withdrawal during this hospitalization.  Consultants: Neurology.  Critical care medicine.   Subjective/Interval History: Patient noted to be pleasantly confused.    Assessment/Plan:  Left basal ganglia ICH -Neurology was following and signed off on 11/24/2021 and recommended outpatient follow-up with neurology -Echo showed EF of 60 to 65%.  LDL 62, A1c is 5.1 -PT/OT recommending CIR.  As per CIR, patient is not a good candidate for CIR.  TOC consulted for possible SNF placement  Oropharyngeal dysphagia -SLP following.  Currently NPO.  Continue cortrak (placed on 11/22/2021).  Seen by speech therapy yesterday and dysphagia 2 diet is recommended.  Follow aspiration precautions. If patient's caloric intake is adequate then we may be able to discontinue the cortrak in the next few days. Will need assistance with feeding.  This was discussed with nursing staff.   Alcohol abuse, alcohol withdrawal with delirium tremens -Patient was noted to have significant alcohol withdrawal symptoms.  PCCM consulted and have signed off.  Completed phenobarbital taper.  He is better. -Vitamin B1 normal -Continue thiamine, folic acid and multivitamin -Intermittently requiring mittens/restraints   Essential hypertension Noted to be on amlodipine.  Was also started on beta-blocker due to poorly controlled blood pressure as well as episodes of SVT.  Blood pressure seems to be better now.  Continue to  monitor.  PSVT Telemetry showed brief episodes of supraventricular tachycardia. Echocardiogram shows normal systolic function.   TSH noted to be 5.0 with a normal free T4 of 0.77. Continue with beta-blocker for now.   Some sinus tachycardia noted on telemetry.  Continue to monitor.     Hypokalemia -Improved   Possible aspiration pneumonia/Possible UTI Was started on Unasyn on 11/17.  Completed 5 days of treatment.  Respiratory status noted to be stable.  He is noted to be afebrile. -UA suggestive of UTI as well.  Cultures negative.   Vitamin B12 deficiency -B12 177.  Folate 12.7.  Supplement B12.   Hyponatremia -Resolved   Macrocytic anemia Secondary to alcohol abuse as well as B12 deficiency. Check iron levels as well.   Elevated LFTs AST and ALT are noted to be minimally elevated.  Possibly from alcohol use.  No recent imaging study of the hepatobiliary system noted in EMR.  Since levels are reasonably stable this can be pursued in the outpatient setting. Will check hepatitis panel.   Tobacco use, history of emphysema Continue nebs as needed Respiratory status is stable.  Severe protein calorie malnutrition Nutrition Problem: Severe Malnutrition Etiology: social / environmental circumstances Signs/Symptoms: severe fat depletion, severe muscle depletion   DVT Prophylaxis: Subcutaneous heparin Code Status: Full code Family Communication: No family at bedside Disposition Plan: SNF  Status is: Inpatient Remains inpatient appropriate because: Intracranial hemorrhage, dysphagia     Medications: Scheduled:  amLODipine  10 mg Per Tube Daily   vitamin B-12  1,000 mcg Per Tube Daily   feeding supplement  237 mL Oral Q24H   feeding supplement (PROSource TF20)  60 mL Per Tube Daily   folic acid  1 mg Per Tube  Daily   free water  100 mL Per Tube Q4H   heparin injection (subcutaneous)  5,000 Units Subcutaneous Q8H   hydrocortisone cream   Topical BID   metoprolol  tartrate  25 mg Per Tube BID   multivitamin with minerals  1 tablet Per Tube Daily   mouth rinse  15 mL Mouth Rinse 4 times per day   pantoprazole (PROTONIX) IV  40 mg Intravenous QHS   senna-docusate  1 tablet Per Tube BID   sodium chloride flush  3 mL Intravenous Once   thiamine  100 mg Per Tube Daily   Continuous:  feeding supplement (JEVITY 1.5 CAL/FIBER) Stopped (11/30/21 0742)   YQI:[id number] **OR** acetaminophen (TYLENOL) oral liquid 160 mg/5 mL **OR** acetaminophen, ipratropium-albuterol, labetalol, LORazepam, mouth rinse  Antibiotics: Anti-infectives (From admission, onward)    Start     Dose/Rate Route Frequency Ordered Stop   11/24/21 1530  Ampicillin-Sulbactam (UNASYN) 3 g in sodium chloride 0.9 % 100 mL IVPB        3 g 200 mL/hr over 30 Minutes Intravenous Every 6 hours 11/24/21 1437 11/28/21 2110       Objective:  Vital Signs  Vitals:   11/29/21 2004 11/29/21 2331 11/30/21 0302 11/30/21 0808  BP: (!) 158/101 128/78 133/89 139/89  Pulse: 92 72 80 90  Resp: 18 18 20 17   Temp: (!) 97.4 F (36.3 C) 98.9 F (37.2 C) 98 F (36.7 C) 97.8 F (36.6 C)  TempSrc: Oral  Oral Oral  SpO2: 99% 99% 98% 97%  Weight:      Height:        Intake/Output Summary (Last 24 hours) at 11/30/2021 0955 Last data filed at 11/30/2021 0900 Gross per 24 hour  Intake 1205.17 ml  Output 550 ml  Net 655.17 ml    Filed Weights   11/26/21 0439 11/27/21 0500 11/29/21 0353  Weight: 53.1 kg 57.8 kg 55.4 kg    General appearance: Awake alert.  In no distress.  Mildly agitated Nasogastric feeding tube was noted Resp: Clear to auscultation bilaterally.  Normal effort Cardio: S1-S2 is normal regular.  No S3-S4.  No rubs murmurs or bruit GI: Abdomen is soft.  Nontender nondistended.  Bowel sounds are present normal.  No masses organomegaly Extremities: No edema.  Moving all of his extremities    Lab Results:  Data Reviewed: I have personally reviewed following labs and  reports of the imaging studies  CBC: Recent Labs  Lab 11/24/21 0404 11/25/21 0418 11/30/21 0231  WBC 9.5 8.6 10.8*  HGB 13.5 12.0* 12.5*  HCT 38.6* 33.9* 36.3*  MCV 104.0* 104.0* 105.8*  PLT 219 232 352     Basic Metabolic Panel: Recent Labs  Lab 11/23/21 1816 11/23/21 1816 11/24/21 0404 11/25/21 0418 11/26/21 0433 11/27/21 0327 11/29/21 0710 11/30/21 0231  NA  --    < > 141 142 141 142 143 139  K  --    < > 4.1 3.3* 3.7 3.5 3.6 3.6  CL  --    < > 113* 110 113* 108 103 104  CO2  --    < > 20* 20* 19* 24 23 21*  GLUCOSE  --    < > 117* 122* 111* 92 125* 106*  BUN  --    < > 14 14 14 16 16 14   CREATININE  --    < > 0.76 0.80 0.68 0.70 0.72 0.71  CALCIUM  --    < > 9.3 9.1 9.3 9.6 9.7 9.2  MG 2.6*  --  2.1  --  1.7 1.7 1.8 2.0  PHOS 3.4  --  3.4  --   --   --   --  4.8*   < > = values in this interval not displayed.     GFR: Estimated Creatinine Clearance: 75 mL/min (by C-G formula based on SCr of 0.71 mg/dL).  Liver Function Tests: Recent Labs  Lab 11/26/21 0433 11/30/21 0231  AST 50* 92*  ALT 33 70*  ALKPHOS 90 126  BILITOT 0.3 0.3  PROT 7.0 7.5  ALBUMIN 2.8* 3.1*      CBG: Recent Labs  Lab 11/29/21 2005 11/29/21 2333 11/30/21 0126 11/30/21 0356 11/30/21 0810  GLUCAP 117* 125* 112* 131* 126*       Recent Results (from the past 240 hour(s))  Urine Culture     Status: None   Collection Time: 11/25/21 10:32 AM   Specimen: Urine, Clean Catch  Result Value Ref Range Status   Specimen Description URINE, CLEAN CATCH  Final   Special Requests NONE  Final   Culture   Final    NO GROWTH Performed at [facility], [address] [place], [place] [address]    Report Status 11/26/2021 FINAL  Final  Culture, blood (Routine X 2) w Reflex to ID Panel     Status: None (Preliminary result)   Collection Time: 11/25/21 10:56 AM   Specimen: BLOOD LEFT ARM  Result Value Ref Range Status   Specimen Description BLOOD LEFT ARM  Final   Special  Requests   Final    IN PEDIATRIC BOTTLE Blood Culture results may not be optimal due to an inadequate volume of blood received in culture bottles   Culture   Final    NO GROWTH 4 DAYS Performed at [facility], [address] [place], [place] [address]    Report Status PENDING  Incomplete  Culture, blood (Routine X 2) w Reflex to ID Panel     Status: None (Preliminary result)   Collection Time: 11/25/21 11:13 AM   Specimen: BLOOD LEFT HAND  Result Value Ref Range Status   Specimen Description BLOOD LEFT HAND  Final   Special Requests   Final    BOTTLES DRAWN AEROBIC AND ANAEROBIC Blood Culture adequate volume   Culture   Final    NO GROWTH 4 DAYS Performed at [facility], [address] [place], [place] [address]    Report Status PENDING  Incomplete      Radiology Studies: DG Swallowing Func-Speech Pathology  Result Date: 11/28/2021 Table formatting from the original result was not included. Images from the original result were not included. Objective Swallowing Evaluation: Type of Study: MBS-Modified Barium Swallow Study  Patient Details Name: Brent RopesRonnie Warren MRN: [id number] Date of Birth: 03/28/1960 Today's Date: 11/28/2021 Time: SLP Start Time (ACUTE ONLY): 1020 -SLP Stop Time (ACUTE ONLY): 1045 SLP Time Calculation (min) (ACUTE ONLY): 25 min Past Medical History: Past Medical History: Diagnosis Date  Alcoholic (HCC)   Chest pain 04/02/2017  COPD (chronic obstructive pulmonary disease) (HCC)   Empyema of pleural space (HCC) 05/2015  ETOH abuse 11/06/2016  Hepatitis 05/22/2015  HTN (hypertension)   Immunization due 11/06/2016  Lung nodule, multiple 11/08/2015  Malnutrition of moderate degree 06/02/2015  Pneumonia 05/2015  Pulmonary emphysema (HCC) 11/06/2016  Tobacco abuse   Urge incontinence 11/06/2016 Past Surgical History: Past Surgical History: Procedure Laterality Date  arm surgery    DECORTICATION Right 06/03/2015  Procedure:  DECORTICATION;  Surgeon: [name], MD;   Location: [facility] OR;  Service: Thoracic;  Laterality: Right;  VIDEO ASSISTED THORACOSCOPY (VATS)/EMPYEMA Right 06/03/2015  Procedure: VIDEO ASSISTED THORACOSCOPY (VATS)/EMPYEMA;  Surgeon: [name], MD;  Location: [facility] OR;  Service: Thoracic;  Laterality: Right; HPI: 61 year old male with history of HTN, tobacco use, EtOH use who comes into the hospital with sudden onset slurred speech and right-sided weakness.  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   He started to develop increased withdrawal symptoms on 11/13  No data recorded  Recommendations for follow up therapy are one component of a multi-disciplinary discharge planning process, led by the attending physician.  Recommendations may be updated based on patient status, additional functional criteria and insurance authorization. Assessment / Plan / Recommendation   11/28/2021  12:30 PM Clinical Impressions Clinical Impression Pt demonstrates mild oral dysphagia with slow lingual mobility, but adequate to clear most of the bolus from the oral cavity. Pt does have decreased bolus cohesion, contributing to premature spillage and delayed swallowing present with all trials. No penetration or aspiration occurred. No residue, no significant weakness. 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 Thin liquids shold be toelrated if pt is sitting upright. Pills can be given whole in puree. Will f/u for tolerance.     11/28/2021  12:30 PM Treatment Recommendations Treatment Recommendations Therapy as outlined in treatment plan below     11/28/2021  12:30 PM Prognosis Prognosis for Safe Diet Advancement Good   11/28/2021  12:30 PM Diet Recommendations SLP Diet Recommendations Dysphagia 2 (Fine chop) solids;Thin liquid Liquid Administration via Cup;Straw Medication Administration Whole meds with puree Compensations Slow rate;Small sips/bites Postural Changes Remain semi-upright  after after feeds/meals (Comment);Seated upright at 90 degrees     11/28/2021  12:30 PM Other Recommendations Oral Care Recommendations Oral care BID Follow Up Recommendations Acute inpatient rehab (3hours/day)   11/28/2021  12:30 PM Frequency and Duration  Speech Therapy Frequency (ACUTE ONLY) min 2x/week Treatment Duration 2 weeks     11/28/2021  12:30 PM Oral Phase Oral Phase Impaired Oral - Nectar Cup Decreased bolus cohesion Oral - Nectar Straw Decreased bolus cohesion Oral - Thin Straw Decreased bolus cohesion Oral - Mech Soft Decreased bolus cohesion Oral - Pill [facility]    11/28/2021  12:30 PM Pharyngeal Phase Pharyngeal Phase Impaired Pharyngeal- Nectar Cup Delayed swallow initiation-pyriform sinuses Pharyngeal- Nectar Straw Delayed swallow initiation-pyriform sinuses Pharyngeal- Thin Straw Delayed swallow initiation-pyriform sinuses Pharyngeal- Puree Delayed swallow initiation-vallecula Pharyngeal- Mechanical Soft Delayed swallow initiation-vallecula     No data to display    DeBlois, Riley Nearing 11/28/2021, 1:06 PM                         LOS: 12 days   Adamaris King [employer] on [email or web address]  11/30/2021, 9:55 AM

## 2021-12-01 DIAGNOSIS — R1084 Generalized abdominal pain: Secondary | ICD-10-CM | POA: Diagnosis not present

## 2021-12-01 DIAGNOSIS — D519 Vitamin B12 deficiency anemia, unspecified: Secondary | ICD-10-CM | POA: Diagnosis not present

## 2021-12-01 DIAGNOSIS — R451 Restlessness and agitation: Secondary | ICD-10-CM | POA: Diagnosis not present

## 2021-12-01 DIAGNOSIS — I610 Nontraumatic intracerebral hemorrhage in hemisphere, subcortical: Secondary | ICD-10-CM | POA: Diagnosis not present

## 2021-12-01 DIAGNOSIS — G9340 Encephalopathy, unspecified: Secondary | ICD-10-CM | POA: Diagnosis not present

## 2021-12-01 DIAGNOSIS — J690 Pneumonitis due to inhalation of food and vomit: Secondary | ICD-10-CM | POA: Diagnosis not present

## 2021-12-01 DIAGNOSIS — E43 Unspecified severe protein-calorie malnutrition: Secondary | ICD-10-CM | POA: Diagnosis not present

## 2021-12-01 DIAGNOSIS — I10 Essential (primary) hypertension: Secondary | ICD-10-CM | POA: Diagnosis not present

## 2021-12-01 DIAGNOSIS — I471 Supraventricular tachycardia, unspecified: Secondary | ICD-10-CM | POA: Diagnosis not present

## 2021-12-01 DIAGNOSIS — J439 Emphysema, unspecified: Secondary | ICD-10-CM | POA: Diagnosis not present

## 2021-12-01 DIAGNOSIS — R1312 Dysphagia, oropharyngeal phase: Secondary | ICD-10-CM | POA: Diagnosis not present

## 2021-12-01 DIAGNOSIS — G8191 Hemiplegia, unspecified affecting right dominant side: Secondary | ICD-10-CM | POA: Diagnosis not present

## 2021-12-01 DIAGNOSIS — I959 Hypotension, unspecified: Secondary | ICD-10-CM | POA: Diagnosis not present

## 2021-12-01 LAB — IRON AND TIBC
Iron: 95 ug/dL (ref 45–182)
Saturation Ratios: 38 % (ref 17.9–39.5)
TIBC: 252 ug/dL (ref 250–450)
UIBC: 157 ug/dL

## 2021-12-01 LAB — GLUCOSE, CAPILLARY
Glucose-Capillary: 98 mg/dL (ref 70–99)
Glucose-Capillary: 130 mg/dL — ABNORMAL HIGH (ref 70–99)
Glucose-Capillary: 129 mg/dL — ABNORMAL HIGH (ref 70–99)
Glucose-Capillary: 134 mg/dL — ABNORMAL HIGH (ref 70–99)
Glucose-Capillary: 127 mg/dL — ABNORMAL HIGH (ref 70–99)

## 2021-12-01 LAB — HEPATITIS PANEL, ACUTE
HCV Ab: NONREACTIVE
Hep A IgM: NONREACTIVE
Hep B C IgM: NONREACTIVE
Hepatitis B Surface Ag: NONREACTIVE

## 2021-12-01 LAB — RETICULOCYTES
Immature Retic Fract: 11.6 % (ref 2.3–15.9)
RBC.: 3.46 MIL/uL — ABNORMAL LOW (ref 4.22–5.81)
Retic Count, Absolute: 45.3 10*3/uL (ref 19.0–186.0)
Retic Ct Pct: 1.3 % (ref 0.4–3.1)

## 2021-12-01 LAB — FERRITIN: Ferritin: 1074 ng/mL — ABNORMAL HIGH (ref 24–336)

## 2021-12-01 MED ORDER — METOPROLOL TARTRATE 50 MG PO TABS
50.0000 mg | ORAL_TABLET | Freq: Two times a day (BID) | ORAL | Status: DC
  Administered 2021-12-01 – 2021-12-15 (×27): 50 mg
  Filled 2021-12-01 (×28): qty 1

## 2021-12-01 MED ORDER — JEVITY 1.5 CAL/FIBER PO LIQD
1000.0000 mL | ORAL | Status: DC
  Administered 2021-12-01 – 2021-12-03 (×3): 1000 mL
  Filled 2021-12-01 (×2): qty 1000
  Filled 2021-12-01 (×2): qty 948
  Filled 2021-12-01: qty 1000
  Filled 2021-12-01: qty 948
  Filled 2021-12-01: qty 1000

## 2021-12-01 MED ORDER — PANTOPRAZOLE SODIUM 40 MG PO TBEC
40.0000 mg | DELAYED_RELEASE_TABLET | Freq: Every day | ORAL | Status: DC
  Administered 2021-12-01 – 2021-12-27 (×27): 40 mg via ORAL
  Filled 2021-12-01 (×27): qty 1

## 2021-12-01 MED ORDER — QUETIAPINE FUMARATE 25 MG PO TABS
25.0000 mg | ORAL_TABLET | Freq: Two times a day (BID) | ORAL | Status: DC
  Administered 2021-12-01 – 2021-12-04 (×8): 25 mg
  Filled 2021-12-01 (×8): qty 1

## 2021-12-01 NOTE — Progress Notes (Signed)
Speech Language Pathology Treatment: Dysphagia;Cognitive-Linquistic  Patient Details Name: Brent Warren MRN: [id number] DOB: 06/17/1960 Today's Date: 12/01/2021 Time: 0174-9449 SLP Time Calculation (min) (ACUTE ONLY): 25 min  Assessment / Plan / Recommendation Clinical Impression  Pt seen with am meal. Restraint removed, pt needs assist to problem solve using left hand. Mildly distractible, eventually had to provided total assisted feeding for pt to have any meaningful intake. Pt did tolerate textures well. Mild right oral residue noted which cleared with a liquid wash. Pt reports no appetite. Pt still largely unintelligible. In 25% of conversation if a cues for revision was given pt was able to repeat with improved intelligibility. When given a model pt can repeat with over articulation. When naming foods or counting pt is intelligible at word level. Will continue efforts. Hopeful diet will improve for cortrak removal after tube feed adjusted. Also asked RN to given pills whole in puree.   HPI        SLP Plan  Continue with current plan of care      Recommendations for follow up therapy are one component of a multi-disciplinary discharge planning process, led by the attending physician.  Recommendations may be updated based on patient status, additional functional criteria and insurance authorization.    Recommendations  Diet recommendations: Dysphagia 2 (fine chop);Thin liquid Liquids provided via: Cup;Straw Medication Administration: Whole meds with puree Supervision: Full supervision/cueing for compensatory strategies;Staff to assist with self feeding Compensations: Small sips/bites;Slow rate;Minimize environmental distractions Postural Changes and/or Swallow Maneuvers: Seated upright 90 degrees                Oral Care Recommendations: Oral care BID Follow Up Recommendations: Acute inpatient rehab (3hours/day) Assistance recommended at discharge: Frequent or constant  Supervision/Assistance SLP Visit Diagnosis: Dysphagia, unspecified (R13.10) Plan: Continue with current plan of care           Joycelin Radloff, Riley Nearing  12/01/2021, 9:36 AM

## 2021-12-01 NOTE — Plan of Care (Signed)
  Problem: Education: Goal: Knowledge of disease or condition will improve Outcome: Progressing   Problem: Intracerebral Hemorrhage Tissue Perfusion: Goal: Complications of Intracerebral Hemorrhage will be minimized Outcome: Progressing   Problem: Coping: Goal: Will identify appropriate support needs Outcome: Progressing   Problem: Self-Care: Goal: Ability to participate in self-care as condition permits will improve Outcome: Progressing   Problem: Nutrition: Goal: Risk of aspiration will decrease Outcome: Progressing

## 2021-12-01 NOTE — Progress Notes (Signed)
[facility] PROGRESS NOTE   Brent Warren FAO:[id number] DOB: September 18, 1960 DOA: 11/18/2021  PCP: Marcine Matar, MD  Brief History/Interval Summary: 61 year old male with history of HTN, tobacco use, chronic alcohol use presented with sudden onset slurred speech and right-sided weakness.  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  He was admitted to the neuro ICU, remained stable and transferred to the hospitalist service on 11/13.  Patient also developed alcohol withdrawal during this hospitalization.  Consultants: Neurology.  Critical care medicine.   Subjective/Interval History: Patient remains confused.  Has been agitated over the last 24 to 48 hours.  Noted to be in restraints.    Assessment/Plan:  Left basal ganglia ICH -Neurology was following and signed off on 11/24/2021 and recommended outpatient follow-up with neurology -Echo showed EF of 60 to 65%.  LDL 62, A1c is 5.1 -PT/OT recommending CIR.  As per CIR, patient is not a good candidate for CIR.  TOC consulted for possible SNF placement  Oropharyngeal dysphagia -SLP following.  Currently NPO.  Continue cortrak (placed on 11/22/2021).  Seen by speech therapy and dysphagia 2 diet is recommended.  Follow aspiration precautions. If patient's caloric intake is adequate then we may be able to discontinue the cortrak in the near future. Will need assistance with feeding.  This was discussed with nursing staff.  Agitation Patient has been somewhat agitated over the last 2 days.  He is noted to be afebrile.  His labs were all stable 2 days ago.  Do not suspect an infectious etiology at this time. Patient started on Seroquel today.  EKG was done which showed normal QT interval.   Alcohol abuse, alcohol withdrawal with delirium tremens -Patient was noted to have significant alcohol withdrawal symptoms.  PCCM consulted and have signed off.  Completed phenobarbital taper.  -Vitamin B1 normal -Continue  thiamine, folic acid and multivitamin -Intermittently requiring mittens/restraints   Essential hypertension Noted to be on amlodipine.  Was also started on beta-blocker due to poorly controlled blood pressure as well as episodes of SVT.  Continue to monitor.  PSVT Telemetry showed brief episodes of supraventricular tachycardia. Echocardiogram shows normal systolic function.   TSH noted to be 5.0 with a normal free T4 of 0.77. Telemetry shows tachycardia at times.  Will increase the dose of his beta-blocker.   Hypokalemia -Improved   Possible aspiration pneumonia/Possible UTI UA suggestive of UTI as well.  Cultures negative. Was started on Unasyn on 11/17.  Completed 5 days of treatment.  Respiratory status noted to be stable.  He is noted to be afebrile.  Vitamin B12 deficiency -B12 177.  Folate 12.7.  Supplement B12.   Hyponatremia -Resolved   Macrocytic anemia Secondary to alcohol abuse as well as B12 deficiency. Ferritin noted to be 1074.  Iron 95.  TIBC 252.   Elevated LFTs AST and ALT are noted to be minimally elevated.  Possibly from alcohol use.  No recent imaging study of the hepatobiliary system noted in EMR.  Since levels are reasonably stable this can be pursued in the outpatient setting. Hepatitis panel is pending.   Tobacco use, history of emphysema Continue nebs as needed Respiratory status is stable.  Severe protein calorie malnutrition Nutrition Problem: Severe Malnutrition Etiology: social / environmental circumstances Signs/Symptoms: severe fat depletion, severe muscle depletion   DVT Prophylaxis: Subcutaneous heparin Code Status: Full code Family Communication: No family at bedside Disposition Plan: SNF once feeding tube issue has been sorted out.  Status is: Inpatient Remains  inpatient appropriate because: Intracranial hemorrhage, dysphagia     Medications: Scheduled:  amLODipine  10 mg Per Tube Daily   vitamin B-12  1,000 mcg Per Tube Daily    feeding supplement  237 mL Oral Q24H   folic acid  1 mg Per Tube Daily   free water  100 mL Per Tube Q4H   heparin injection (subcutaneous)  5,000 Units Subcutaneous Q8H   hydrocortisone cream   Topical BID   metoprolol tartrate  50 mg Per Tube BID   multivitamin with minerals  1 tablet Per Tube Daily   mouth rinse  15 mL Mouth Rinse 4 times per day   pantoprazole (PROTONIX) IV  40 mg Intravenous QHS   QUEtiapine  25 mg Per Tube BID   senna-docusate  1 tablet Per Tube BID   sodium chloride flush  3 mL Intravenous Once   thiamine  100 mg Per Tube Daily   Continuous:  feeding supplement (JEVITY 1.5 CAL/FIBER)     [id number]:[id number] **OR** acetaminophen (TYLENOL) oral liquid 160 mg/5 mL **OR** acetaminophen, ipratropium-albuterol, labetalol, LORazepam, mouth rinse  Antibiotics: Anti-infectives (From admission, onward)    Start     Dose/Rate Route Frequency Ordered Stop   11/24/21 1530  Ampicillin-Sulbactam (UNASYN) 3 g in sodium chloride 0.9 % 100 mL IVPB        3 g 200 mL/hr over 30 Minutes Intravenous Every 6 hours 11/24/21 1437 11/28/21 2110       Objective:  Vital Signs  Vitals:   11/30/21 2339 12/01/21 0430 12/01/21 0500 12/01/21 0741  BP: (!) 142/94 133/89  (!) 141/88  Pulse: 93 100  84  Resp: 16 18  18   Temp: 98.7 F (37.1 C) 99.1 F (37.3 C)  97.8 F (36.6 C)  TempSrc: Oral Oral  Oral  SpO2: 99% 97%  99%  Weight:   50.9 kg   Height:        Intake/Output Summary (Last 24 hours) at 12/01/2021 0952 Last data filed at 12/01/2021 0600 Gross per 24 hour  Intake --  Output 300 ml  Net -300 ml    Filed Weights   11/27/21 0500 11/29/21 0353 12/01/21 0500  Weight: 57.8 kg 55.4 kg 50.9 kg    General appearance: Awake alert.  In no distress.  Distracted Nasogastric feeding tube noted Resp: Clear to auscultation bilaterally.  Normal effort Cardio: S1-S2 is normal regular.  No S3-S4.  No rubs murmurs or bruit GI: Abdomen is soft.  Nontender nondistended.   Bowel sounds are present normal.  No masses organomegaly Extremities: No edema.  Moving all of his extremities.    Lab Results:  Data Reviewed: I have personally reviewed following labs and reports of the imaging studies  CBC: Recent Labs  Lab 11/25/21 0418 11/30/21 0231  WBC 8.6 10.8*  HGB 12.0* 12.5*  HCT 33.9* 36.3*  MCV 104.0* 105.8*  PLT 232 352     Basic Metabolic Panel: Recent Labs  Lab 11/25/21 0418 11/26/21 0433 11/27/21 0327 11/29/21 0710 11/30/21 0231  NA 142 141 142 143 139  K 3.3* 3.7 3.5 3.6 3.6  CL 110 113* 108 103 104  CO2 20* 19* 24 23 21*  GLUCOSE 122* 111* 92 125* 106*  BUN 14 14 16 16 14   CREATININE 0.80 0.68 0.70 0.72 0.71  CALCIUM 9.1 9.3 9.6 9.7 9.2  MG  --  1.7 1.7 1.8 2.0  PHOS  --   --   --   --  4.8*  GFR: Estimated Creatinine Clearance: 68.9 mL/min (by C-G formula based on SCr of 0.71 mg/dL).  Liver Function Tests: Recent Labs  Lab 11/26/21 0433 11/30/21 0231  AST 50* 92*  ALT 33 70*  ALKPHOS 90 126  BILITOT 0.3 0.3  PROT 7.0 7.5  ALBUMIN 2.8* 3.1*      CBG: Recent Labs  Lab 11/30/21 1632 11/30/21 2034 11/30/21 2341 12/01/21 0350 12/01/21 0743  GLUCAP 115* 114* 122* 98 130*       Recent Results (from the past 240 hour(s))  Urine Culture     Status: None   Collection Time: 11/25/21 10:32 AM   Specimen: Urine, Clean Catch  Result Value Ref Range Status   Specimen Description URINE, CLEAN CATCH  Final   Special Requests NONE  Final   Culture   Final    NO GROWTH Performed at [facility], [address] [place], [place] [address]    Report Status 11/26/2021 FINAL  Final  Culture, blood (Routine X 2) w Reflex to ID Panel     Status: None   Collection Time: 11/25/21 10:56 AM   Specimen: BLOOD LEFT ARM  Result Value Ref Range Status   Specimen Description BLOOD LEFT ARM  Final   Special Requests   Final    IN PEDIATRIC BOTTLE Blood Culture results may not be optimal due to an inadequate volume  of blood received in culture bottles   Culture   Final    NO GROWTH 5 DAYS Performed at [facility], [address] [place], [place] [address]    Report Status 11/30/2021 FINAL  Final  Culture, blood (Routine X 2) w Reflex to ID Panel     Status: None   Collection Time: 11/25/21 11:13 AM   Specimen: BLOOD LEFT HAND  Result Value Ref Range Status   Specimen Description BLOOD LEFT HAND  Final   Special Requests   Final    BOTTLES DRAWN AEROBIC AND ANAEROBIC Blood Culture adequate volume   Culture   Final    NO GROWTH 5 DAYS Performed at [facility], [address], [place], [place] [address]    Report Status 11/30/2021 FINAL  Final      Radiology Studies: No results found.     LOS: 13 days   Rodderick Holtzer [employer] on [email or web address]  12/01/2021, 9:52 AM

## 2021-12-01 NOTE — Plan of Care (Signed)
  Problem: Education: Goal: Knowledge of disease or condition will improve Outcome: Progressing Goal: Knowledge of secondary prevention will improve (MUST DOCUMENT ALL) Outcome: Progressing Goal: Knowledge of patient specific risk factors will improve (Mark N/A or DELETE if not current risk factor) Outcome: Progressing   Problem: Intracerebral Hemorrhage Tissue Perfusion: Goal: Complications of Intracerebral Hemorrhage will be minimized Outcome: Progressing   Problem: Coping: Goal: Will verbalize positive feelings about self Outcome: Progressing Goal: Will identify appropriate support needs Outcome: Progressing   Problem: Health Behavior/Discharge Planning: Goal: Ability to manage health-related needs will improve Outcome: Progressing Goal: Goals will be collaboratively established with patient/family Outcome: Progressing   Problem: Self-Care: Goal: Ability to participate in self-care as condition permits will improve Outcome: Progressing Goal: Verbalization of feelings and concerns over difficulty with self-care will improve Outcome: Progressing Goal: Ability to communicate needs accurately will improve Outcome: Progressing   Problem: Nutrition: Goal: Risk of aspiration will decrease Outcome: Progressing Goal: Dietary intake will improve Outcome: Progressing   Problem: Safety: Goal: Non-violent Restraint(s) Outcome: Progressing   Problem: Education: Goal: Knowledge of General Education information will improve Description: Including pain rating scale, medication(s)/side effects and non-pharmacologic comfort measures Outcome: Progressing   Problem: Health Behavior/Discharge Planning: Goal: Ability to manage health-related needs will improve Outcome: Progressing   Problem: Clinical Measurements: Goal: Ability to maintain clinical measurements within normal limits will improve Outcome: Progressing Goal: Will remain free from infection Outcome: Progressing Goal:  Diagnostic test results will improve Outcome: Progressing Goal: Respiratory complications will improve Outcome: Progressing Goal: Cardiovascular complication will be avoided Outcome: Progressing   Problem: Activity: Goal: Risk for activity intolerance will decrease Outcome: Progressing   Problem: Nutrition: Goal: Adequate nutrition will be maintained Outcome: Progressing   Problem: Coping: Goal: Level of anxiety will decrease Outcome: Progressing   Problem: Elimination: Goal: Will not experience complications related to bowel motility Outcome: Progressing Goal: Will not experience complications related to urinary retention Outcome: Progressing   Problem: Pain Managment: Goal: General experience of comfort will improve Outcome: Progressing   Problem: Safety: Goal: Ability to remain free from injury will improve Outcome: Progressing   Problem: Skin Integrity: Goal: Risk for impaired skin integrity will decrease Outcome: Progressing   

## 2021-12-01 NOTE — Progress Notes (Signed)
Brief Nutrition Support Note  Discussed with RN and SLP. Pt has the functional ability to eat, but has poor appetite and needs assistance as his dominate side was affected. Will adjust feeds to nocturnal to determine if appetite improves.  Recommend the following via cortrak tube: Jevity 1.5 x 51mL/h x 12 hours (8PM-8AM) Free water q4h Provides 1350kcal, 57g of protein, and of fluid  Greig Castilla, RD, LDN Clinical Dietitian RD pager # available in AMION  After hours/weekend pager # available in [facility]

## 2021-12-01 NOTE — Progress Notes (Signed)
Occupational Therapy Treatment Patient Details Name: Brent Warren MRN: [id number] DOB: Jun 11, 1960 Today's Date: 12/01/2021   History of present illness 61 yo male presenting 11/11 with R-sided weakness, facial droop and slurred speech. 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 PMH includes: HTN, COPD, and alcohol abuse.   OT comments  Patient received in supine and in pleasant mood and agreeable to getting into recliner. Patient was mod assist to get to EOB and when attempting to stand to transfer to recliner patient stated he needed to use bathroom and attempted to return to supine. Patient instructed on [facility] transfer and was max assist with face to face technique. Patient stood from [facility] for cleaning and used bed rail to assist and returned to EOB. Patient became agitated and wanted to return to supine. Patient was max assist to return to supine and restraints reapplied. Acute OT to continue to follow.    Recommendations for follow up therapy are one component of a multi-disciplinary discharge planning process, led by the attending physician.  Recommendations may be updated based on patient status, additional functional criteria and insurance authorization.    Follow Up Recommendations  Skilled nursing-short term rehab (<3 hours/day)     Assistance Recommended at Discharge Frequent or constant Supervision/Assistance  Patient can return home with the following  Two people to help with walking and/or transfers;Two people to help with bathing/dressing/bathroom;Direct supervision/assist for medications management;Direct supervision/assist for financial management;Assistance with feeding;Assistance with cooking/housework;Assist for transportation;Help with stairs or ramp for entrance   Equipment Recommendations  BSC/3in1;Wheelchair (measurements OT);Wheelchair cushion (measurements OT)    Recommendations for Other Services      Precautions / Restrictions Precautions Precautions:  Fall Precaution Comments: watch HR Restrictions Weight Bearing Restrictions: No       Mobility Bed Mobility Overal bed mobility: Needs Assistance Bed Mobility: Supine to Sit, Sit to Supine     Supine to sit: Mod assist Sit to supine: Max assist   General bed mobility comments: required assistance with BLEs to get to EOB and back to supine    Transfers Overall transfer level: Needs assistance Equipment used: 1 person hand held assist Transfers: Sit to/from Stand, Bed to chair/wheelchair/BSC Sit to Stand: Max assist Stand pivot transfers: Max assist         General transfer comment: stand pivot transfer from EOB to [facility] and back with patient holding onto bed rail     Balance Overall balance assessment: Needs assistance Sitting-balance support: Single extremity supported, Feet supported Sitting balance-Leahy Scale: Poor Sitting balance - Comments: reliant on UE support   Standing balance support: Single extremity supported, During functional activity Standing balance-Leahy Scale: Poor Standing balance comment: reliant on therapist and bed rail for support                           ADL either performed or assessed with clinical judgement   ADL Overall ADL's : Needs assistance/impaired                         Toilet Transfer: Maximal assistance;Stand-pivot;BSC/3in1 Toilet Transfer Details (indicate cue type and reason): transfer from EOB to [place] and Hygiene: Total assistance;+2 for physical assistance;Sit to/from stand Toileting - Clothing Manipulation Details (indicate cue type and reason): max assist of one for standing from [facility] and total assist of another for toilet hygiene            Extremity/Trunk Assessment  Upper Extremity Assessment RUE Deficits / Details: no movemet noted however increased flexor tone RUE Sensation: decreased light touch RUE Coordination: decreased fine motor;decreased gross motor             Vision       Perception     Praxis      Cognition Arousal/Alertness: Awake/alert Behavior During Therapy: Flat affect, Agitated Overall Cognitive Status: Difficult to assess Area of Impairment: Following commands, Orientation, Attention, Problem solving, Awareness, Safety/judgement                       Following Commands: Follows one step commands with increased time Safety/Judgement: Decreased awareness of safety, Decreased awareness of deficits     General Comments: difficulty to understand, flat affect initially, became agitated once on EOB        Exercises      Shoulder Instructions       General Comments      Pertinent Vitals/ Pain       Pain Assessment Pain Assessment: Faces Faces Pain Scale: No hurt Pain Intervention(s): Monitored during session  Home Living                                          Prior Functioning/Environment              Frequency  Min 2X/week        Progress Toward Goals  OT Goals(current goals can now be found in the care plan section)  Progress towards OT goals: Progressing toward goals  Acute Rehab OT Goals OT Goal Formulation: Patient unable to participate in goal setting Time For Goal Achievement: 12/04/21 Potential to Achieve Goals: Fair ADL Goals Pt Will Perform Grooming: with min assist;sitting Pt Will Perform Upper Body Bathing: with min assist;sitting Pt Will Transfer to Toilet: with +2 assist;with min assist;stand pivot transfer;bedside commode Additional ADL Goal #1: pt will complete basic transfer total +2 min (A) as precursor to adls  Plan Discharge plan remains appropriate    Co-evaluation                 AM-PAC OT "6 Clicks" Daily Activity     Outcome Measure   Help from another person eating meals?: Total Help from another person taking care of personal grooming?: Total Help from another person toileting, which includes using toliet, bedpan, or  urinal?: Total Help from another person bathing (including washing, rinsing, drying)?: Total Help from another person to put on and taking off regular upper body clothing?: A Lot Help from another person to put on and taking off regular lower body clothing?: Total 6 Click Score: 7    End of Session Equipment Utilized During Treatment: Gait belt  OT Visit Diagnosis: Unsteadiness on feet (R26.81);Muscle weakness (generalized) (M62.81);Hemiplegia and hemiparesis;Low vision, both eyes (H54.2);Other symptoms and signs involving cognitive function;Other symptoms and signs involving the nervous system (R29.898) Hemiplegia - Right/Left: Right Hemiplegia - dominant/non-dominant: Dominant Hemiplegia - caused by: Other Nontraumatic intracranial hemorrhage   Activity Tolerance Treatment limited secondary to agitation   Patient Left in bed;with call bell/phone within reach;with bed alarm set;with restraints reapplied   Nurse Communication Mobility status        Time: [id number] OT Time Calculation (min): 29 min  Charges: OT General Charges $OT Visit: 1 Visit OT Treatments $Self Care/Home Management : 23-37 mins  Lodema Hong, OTA Acute  Rehabilitation Services  Office [phone]   Dewain Penning 12/01/2021, 1:45 PM

## 2021-12-02 DIAGNOSIS — I10 Essential (primary) hypertension: Secondary | ICD-10-CM | POA: Diagnosis not present

## 2021-12-02 DIAGNOSIS — I610 Nontraumatic intracerebral hemorrhage in hemisphere, subcortical: Secondary | ICD-10-CM | POA: Diagnosis not present

## 2021-12-02 DIAGNOSIS — R1312 Dysphagia, oropharyngeal phase: Secondary | ICD-10-CM | POA: Diagnosis not present

## 2021-12-02 DIAGNOSIS — R1084 Generalized abdominal pain: Secondary | ICD-10-CM | POA: Diagnosis not present

## 2021-12-02 DIAGNOSIS — R451 Restlessness and agitation: Secondary | ICD-10-CM | POA: Diagnosis not present

## 2021-12-02 DIAGNOSIS — I471 Supraventricular tachycardia, unspecified: Secondary | ICD-10-CM | POA: Diagnosis not present

## 2021-12-02 LAB — GLUCOSE, CAPILLARY
Glucose-Capillary: 108 mg/dL — ABNORMAL HIGH (ref 70–99)
Glucose-Capillary: 109 mg/dL — ABNORMAL HIGH (ref 70–99)
Glucose-Capillary: 110 mg/dL — ABNORMAL HIGH (ref 70–99)
Glucose-Capillary: 111 mg/dL — ABNORMAL HIGH (ref 70–99)
Glucose-Capillary: 119 mg/dL — ABNORMAL HIGH (ref 70–99)
Glucose-Capillary: 130 mg/dL — ABNORMAL HIGH (ref 70–99)
Glucose-Capillary: 138 mg/dL — ABNORMAL HIGH (ref 70–99)

## 2021-12-02 NOTE — Progress Notes (Signed)
[facility] PROGRESS NOTE   Brent Warren ONG:[id number] DOB: 1960/09/29 DOA: 11/18/2021  PCP: Marcine Matar, MD  Brief History/Interval Summary: 61 year old male with history of HTN, tobacco use, chronic alcohol use presented with sudden onset slurred speech and right-sided weakness.  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  He was admitted to the neuro ICU, remained stable and transferred to the hospitalist service on 11/13.  Patient also developed alcohol withdrawal during this hospitalization.  Consultants: Neurology.  Critical care medicine.   Subjective/Interval History: Patient remains in restraints.  He seems to be quite frustrated by it.  He mentions that he is tied down.  I am able to understand more of the speech today compared to the last few days.    Assessment/Plan:  Left basal ganglia ICH -Neurology was following and signed off on 11/24/2021 and recommended outpatient follow-up with neurology -Echo showed EF of 60 to 65%.  LDL 62, A1c is 5.1 -PT/OT recommending CIR.  As per CIR, patient is not a good candidate for CIR.  TOC consulted for possible SNF placement  Oropharyngeal dysphagia -SLP following.  Currently NPO.  Continue cortrak (placed on 11/22/2021).  Seen by speech therapy and dysphagia 2 diet is recommended.  Follow aspiration precautions. If patient's caloric intake is adequate then we may be able to discontinue the cortrak in the near future. Will need assistance with feeding.    Agitation Patient has been somewhat agitated over the last 2-3 days.  No infectious etiology was suspected.  Patient was started on Seroquel.  EKG showed normal QTc. Will discuss with nursing staff to see if his restraints can be slowly discontinued.     Alcohol abuse, alcohol withdrawal with delirium tremens -Patient was noted to have significant alcohol withdrawal symptoms.  PCCM consulted and have signed off.  Completed phenobarbital taper.   -Vitamin B1 normal -Continue thiamine, folic acid and multivitamin -Intermittently requiring mittens/restraints   Essential hypertension Noted to be on amlodipine.  She is reasonably well-controlled. Was also started on beta-blocker due to poorly controlled blood pressure as well as episodes of SVT.  Continue to monitor.  PSVT Telemetry showed brief episodes of supraventricular tachycardia on 11/23. Echocardiogram shows normal systolic function.   TSH noted to be 5.0 with a normal free T4 of 0.77. Patient was started on beta-blocker.  Telemetry shows improvement.  Continue to monitor.   Hypokalemia Had improved.  Will recheck labs tomorrow.   Possible aspiration pneumonia/Possible UTI UA suggestive of UTI as well.  Cultures negative. Was started on Unasyn on 11/17.  Completed 5 days of treatment.  Respiratory status noted to be stable.  He is noted to be afebrile.  Vitamin B12 deficiency B12 177.  Folate 12.7.   Continues to cyanocobalamine.     Hyponatremia Resolved   Macrocytic anemia Secondary to alcohol abuse as well as B12 deficiency. Ferritin noted to be 1074.  Iron 95.  TIBC 252.   Elevated LFTs AST and ALT are noted to be minimally elevated.  Possibly from alcohol use.  No recent imaging study of the hepatobiliary system noted in EMR.  Since levels are reasonably stable this can be pursued in the outpatient setting. Hepatitis panel is unremarkable.   Tobacco use, history of emphysema Continue nebs as needed Respiratory status is stable.  Severe protein calorie malnutrition Nutrition Problem: Severe Malnutrition Etiology: social / environmental circumstances Signs/Symptoms: severe fat depletion, severe muscle depletion   DVT Prophylaxis: Subcutaneous heparin Code Status: Full code  Family Communication: No family at bedside Disposition Plan: SNF once feeding tube issue has been sorted out.  Status is: Inpatient Remains inpatient appropriate because:  Intracranial hemorrhage, dysphagia     Medications: Scheduled:  amLODipine  10 mg Per Tube Daily   vitamin B-12  1,000 mcg Per Tube Daily   feeding supplement  237 mL Oral Q24H   folic acid  1 mg Per Tube Daily   free water  100 mL Per Tube Q4H   heparin injection (subcutaneous)  5,000 Units Subcutaneous Q8H   hydrocortisone cream   Topical BID   metoprolol tartrate  50 mg Per Tube BID   multivitamin with minerals  1 tablet Per Tube Daily   mouth rinse  15 mL Mouth Rinse 4 times per day   pantoprazole  40 mg Oral Daily   QUEtiapine  25 mg Per Tube BID   senna-docusate  1 tablet Per Tube BID   sodium chloride flush  3 mL Intravenous Once   thiamine  100 mg Per Tube Daily   Continuous:  feeding supplement (JEVITY 1.5 CAL/FIBER) 1,000 mL (12/01/21 2055)   [id number] **OR** acetaminophen (TYLENOL) oral liquid 160 mg/5 mL **OR** acetaminophen, ipratropium-albuterol, labetalol, LORazepam, mouth rinse  Antibiotics: Anti-infectives (From admission, onward)    Start     Dose/Rate Route Frequency Ordered Stop   11/24/21 1530  Ampicillin-Sulbactam (UNASYN) 3 g in sodium chloride 0.9 % 100 mL IVPB        3 g 200 mL/hr over 30 Minutes Intravenous Every 6 hours 11/24/21 1437 11/28/21 2110       Objective:  Vital Signs  Vitals:   12/01/21 2123 12/02/21 0353 12/02/21 0448 12/02/21 0815  BP: 117/70 122/77  132/87  Pulse: 92 98  89  Resp: 18 16  17   Temp: 98.4 F (36.9 C) 97.6 F (36.4 C)  99.3 F (37.4 C)  TempSrc:  Oral  Oral  SpO2: 96% 98%  99%  Weight:   53.6 kg   Height:        Intake/Output Summary (Last 24 hours) at 12/02/2021 0951 Last data filed at 12/02/2021 12/04/2021 Gross per 24 hour  Intake 1000 ml  Output 800 ml  Net 200 ml    Filed Weights   11/29/21 0353 12/01/21 0500 12/02/21 0448  Weight: 55.4 kg 50.9 kg 53.6 kg    General appearance: Awake alert.  In no distress.  Seems to be frustrated by the restraints. NG tube is noted Resp: Clear to  auscultation bilaterally.  Normal effort Cardio: S1-S2 is normal regular.  No S3-S4.  No rubs murmurs or bruit GI: Abdomen is soft.  Nontender nondistended.  Bowel sounds are present normal.  No masses organomegaly Extremities: No edema.  Moving all of his extremities    Lab Results:  Data Reviewed: I have personally reviewed following labs and reports of the imaging studies  CBC: Recent Labs  Lab 11/30/21 0231  WBC 10.8*  HGB 12.5*  HCT 36.3*  MCV 105.8*  PLT 352     Basic Metabolic Panel: Recent Labs  Lab 11/26/21 0433 11/27/21 0327 11/29/21 0710 11/30/21 0231  NA 141 142 143 139  K 3.7 3.5 3.6 3.6  CL 113* 108 103 104  CO2 19* 24 23 21*  GLUCOSE 111* 92 125* 106*  BUN 14 16 16 14   CREATININE 0.68 0.70 0.72 0.71  CALCIUM 9.3 9.6 9.7 9.2  MG 1.7 1.7 1.8 2.0  PHOS  --   --   --  4.8*     GFR: Estimated Creatinine Clearance: 72.6 mL/min (by C-G formula based on SCr of 0.71 mg/dL).  Liver Function Tests: Recent Labs  Lab 11/26/21 0433 11/30/21 0231  AST 50* 92*  ALT 33 70*  ALKPHOS 90 126  BILITOT 0.3 0.3  PROT 7.0 7.5  ALBUMIN 2.8* 3.1*      CBG: Recent Labs  Lab 12/01/21 1116 12/01/21 1631 12/01/21 2115 12/02/21 0026 12/02/21 0455  GLUCAP 129* 134* 127* 119* 111*       Recent Results (from the past 240 hour(s))  Urine Culture     Status: None   Collection Time: 11/25/21 10:32 AM   Specimen: Urine, Clean Catch  Result Value Ref Range Status   Specimen Description URINE, CLEAN CATCH  Final   Special Requests NONE  Final   Culture   Final    NO GROWTH Performed at [facility], [address] [place], [place] [address]    Report Status 11/26/2021 FINAL  Final  Culture, blood (Routine X 2) w Reflex to ID Panel     Status: None   Collection Time: 11/25/21 10:56 AM   Specimen: BLOOD LEFT ARM  Result Value Ref Range Status   Specimen Description BLOOD LEFT ARM  Final   Special Requests   Final    IN PEDIATRIC BOTTLE Blood  Culture results may not be optimal due to an inadequate volume of blood received in culture bottles   Culture   Final    NO GROWTH 5 DAYS Performed at [facility], [address], [place], [place] [address]    Report Status 11/30/2021 FINAL  Final  Culture, blood (Routine X 2) w Reflex to ID Panel     Status: None   Collection Time: 11/25/21 11:13 AM   Specimen: BLOOD LEFT HAND  Result Value Ref Range Status   Specimen Description BLOOD LEFT HAND  Final   Special Requests   Final    BOTTLES DRAWN AEROBIC AND ANAEROBIC Blood Culture adequate volume   Culture   Final    NO GROWTH 5 DAYS Performed at [facility], [address], [place], [place] [address]    Report Status 11/30/2021 FINAL  Final      Radiology Studies: No results found.     LOS: 14 days   Keinan Brouillet [employer] on [email or web address]  12/02/2021, 9:51 AM

## 2021-12-02 NOTE — Plan of Care (Signed)
  Problem: Education: Goal: Knowledge of disease or condition will improve Outcome: Progressing Goal: Knowledge of secondary prevention will improve (MUST DOCUMENT ALL) Outcome: Progressing Goal: Knowledge of patient specific risk factors will improve Loraine Leriche N/A or DELETE if not current risk factor) Outcome: Progressing   Problem: Intracerebral Hemorrhage Tissue Perfusion: Goal: Complications of Intracerebral Hemorrhage will be minimized Outcome: Progressing   Problem: Coping: Goal: Will verbalize positive feelings about self Outcome: Progressing Goal: Will identify appropriate support needs Outcome: Progressing   Problem: Health Behavior/Discharge Planning: Goal: Ability to manage health-related needs will improve Outcome: Progressing Goal: Goals will be collaboratively established with patient/family Outcome: Progressing   Problem: Self-Care: Goal: Ability to participate in self-care as condition permits will improve Outcome: Progressing Goal: Verbalization of feelings and concerns over difficulty with self-care will improve Outcome: Progressing Goal: Ability to communicate needs accurately will improve Outcome: Progressing   Problem: Nutrition: Goal: Risk of aspiration will decrease Outcome: Progressing Goal: Dietary intake will improve Outcome: Progressing   Problem: Safety: Goal: Non-violent Restraint(s) Outcome: Progressing   Problem: Education: Goal: Knowledge of General Education information will improve Description: Including pain rating scale, medication(s)/side effects and non-pharmacologic comfort measures Outcome: Progressing   Problem: Health Behavior/Discharge Planning: Goal: Ability to manage health-related needs will improve Outcome: Progressing   Problem: Clinical Measurements: Goal: Ability to maintain clinical measurements within normal limits will improve Outcome: Progressing Goal: Will remain free from infection Outcome: Progressing Goal:  Diagnostic test results will improve Outcome: Progressing Goal: Respiratory complications will improve Outcome: Progressing Goal: Cardiovascular complication will be avoided Outcome: Progressing   Problem: Activity: Goal: Risk for activity intolerance will decrease Outcome: Progressing   Problem: Nutrition: Goal: Adequate nutrition will be maintained Outcome: Progressing   Problem: Coping: Goal: Level of anxiety will decrease Outcome: Progressing   Problem: Elimination: Goal: Will not experience complications related to bowel motility Outcome: Progressing Goal: Will not experience complications related to urinary retention Outcome: Progressing   Problem: Pain Managment: Goal: General experience of comfort will improve Outcome: Progressing   Problem: Safety: Goal: Ability to remain free from injury will improve Outcome: Progressing   Problem: Skin Integrity: Goal: Risk for impaired skin integrity will decrease Outcome: Progressing

## 2021-12-03 DIAGNOSIS — R1084 Generalized abdominal pain: Secondary | ICD-10-CM | POA: Diagnosis not present

## 2021-12-03 DIAGNOSIS — I610 Nontraumatic intracerebral hemorrhage in hemisphere, subcortical: Secondary | ICD-10-CM | POA: Diagnosis not present

## 2021-12-03 DIAGNOSIS — R451 Restlessness and agitation: Secondary | ICD-10-CM | POA: Diagnosis not present

## 2021-12-03 DIAGNOSIS — I10 Essential (primary) hypertension: Secondary | ICD-10-CM | POA: Diagnosis not present

## 2021-12-03 DIAGNOSIS — R1312 Dysphagia, oropharyngeal phase: Secondary | ICD-10-CM | POA: Diagnosis not present

## 2021-12-03 DIAGNOSIS — I471 Supraventricular tachycardia, unspecified: Secondary | ICD-10-CM | POA: Diagnosis not present

## 2021-12-03 LAB — COMPREHENSIVE METABOLIC PANEL
ALT: 97 U/L — ABNORMAL HIGH (ref 0–44)
AST: 91 U/L — ABNORMAL HIGH (ref 15–41)
Albumin: 2.8 g/dL — ABNORMAL LOW (ref 3.5–5.0)
Alkaline Phosphatase: 118 U/L (ref 38–126)
Anion gap: 13 (ref 5–15)
BUN: 17 mg/dL (ref 8–23)
CO2: 22 mmol/L (ref 22–32)
Calcium: 9.3 mg/dL (ref 8.9–10.3)
Chloride: 100 mmol/L (ref 98–111)
Creatinine, Ser: 0.74 mg/dL (ref 0.61–1.24)
GFR, Estimated: >60 mL/min (ref >60)
Glucose, Bld: 120 mg/dL — ABNORMAL HIGH (ref 70–99)
Potassium: 4.3 mmol/L (ref 3.5–5.1)
Sodium: 135 mmol/L (ref 135–145)
Total Bilirubin: 0.3 mg/dL (ref 0.3–1.2)
Total Protein: 7.1 g/dL (ref 6.5–8.1)

## 2021-12-03 LAB — GLUCOSE, CAPILLARY
Glucose-Capillary: 104 mg/dL — ABNORMAL HIGH (ref 70–99)
Glucose-Capillary: 116 mg/dL — ABNORMAL HIGH (ref 70–99)
Glucose-Capillary: 123 mg/dL — ABNORMAL HIGH (ref 70–99)
Glucose-Capillary: 102 mg/dL — ABNORMAL HIGH (ref 70–99)
Glucose-Capillary: 111 mg/dL — ABNORMAL HIGH (ref 70–99)

## 2021-12-03 LAB — CBC
HCT: 33.9 % — ABNORMAL LOW (ref 39.0–52.0)
Hemoglobin: 12.1 g/dL — ABNORMAL LOW (ref 13.0–17.0)
MCH: 36.6 pg — ABNORMAL HIGH (ref 26.0–34.0)
MCHC: 35.7 g/dL (ref 30.0–36.0)
MCV: 102.4 fL — ABNORMAL HIGH (ref 80.0–100.0)
Platelets: 409 10*3/uL — ABNORMAL HIGH (ref 150–400)
RBC: 3.31 MIL/uL — ABNORMAL LOW (ref 4.22–5.81)
RDW: 12.6 % (ref 11.5–15.5)
WBC: 10.4 10*3/uL (ref 4.0–10.5)
nRBC: 0 % (ref 0.0–0.2)

## 2021-12-03 LAB — MAGNESIUM: Magnesium: 1.8 mg/dL (ref 1.7–2.4)

## 2021-12-03 NOTE — Progress Notes (Signed)
[facility] PROGRESS NOTE   Brent Warren ITG:[id number] DOB: 07-Sep-1960 DOA: 11/18/2021  PCP: Marcine Matar, MD  Brief History/Interval Summary: 61 year old male with history of HTN, tobacco use, chronic alcohol use presented with sudden onset slurred speech and right-sided weakness.  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  He was admitted to the neuro ICU, remained stable and transferred to the hospitalist service on 11/13.  Patient also developed alcohol withdrawal during this hospitalization.  Consultants: Neurology.  Critical care medicine.   Subjective/Interval History: Patient was taken off of restraints yesterday.  Seems to be in better spirits this morning.     Assessment/Plan:  Left basal ganglia ICH -Neurology was following and signed off on 11/24/2021 and recommended outpatient follow-up with neurology -Echo showed EF of 60 to 65%.  LDL 62, A1c is 5.1 -PT/OT recommending CIR.  As per CIR, patient is not a good candidate for CIR.  TOC consulted for possible SNF placement  Oropharyngeal dysphagia Initially was kept n.p.o. and started on nasogastric tube feedings.   Seen by speech therapy and diet was advanced.  Currently on dysphagia 2 diet.  Continue to follow aspiration precautions. Patient's caloric intake is not entirely clear at this time.  We will request nutritionist to follow and do a calorie count on him. Will need assistance with feeding.    Agitation Patient was started on Seroquel.  Agitation appears to have improved.   Restraints were discontinued yesterday.   Alcohol abuse, alcohol withdrawal with delirium tremens -Patient was noted to have significant alcohol withdrawal symptoms.  PCCM consulted and have signed off.  Completed phenobarbital taper.  -Vitamin B1 normal -Continue thiamine, folic acid and multivitamin -Intermittently requiring mittens/restraints   Essential hypertension Noted to be on amlodipine.  Blood  pressure is reasonably well-controlled. Was also started on beta-blocker due to poorly controlled blood pressure as well as episodes of SVT.    PSVT Telemetry showed brief episodes of supraventricular tachycardia on 11/23. Echocardiogram shows normal systolic function.   TSH noted to be 5.0 with a normal free T4 of 0.77. Patient was started on beta-blocker.  Telemetry shows improvement.  Okay to discontinue telemetry.   Hypokalemia/hyponatremia Improved  Possible aspiration pneumonia/Possible UTI UA suggestive of UTI as well.  Cultures negative. Was started on Unasyn on 11/17.  Completed 5 days of treatment.  Respiratory status noted to be stable.  He is noted to be afebrile.  Vitamin B12 deficiency B12 177.  Folate 12.7.   Continues to cyanocobalamine.     Macrocytic anemia Secondary to alcohol abuse as well as B12 deficiency. Ferritin noted to be 1074.  Iron 95.  TIBC 252.   Elevated LFTs AST and ALT are noted to be minimally elevated.  Possibly from alcohol use.  No recent imaging study of the hepatobiliary system noted in EMR.  Since levels are reasonably stable this can be pursued in the outpatient setting. Hepatitis panel is unremarkable.   Tobacco use, history of emphysema Continue nebs as needed Respiratory status is stable.  Severe protein calorie malnutrition Nutrition Problem: Severe Malnutrition Etiology: social / environmental circumstances Signs/Symptoms: severe fat depletion, severe muscle depletion   DVT Prophylaxis: Subcutaneous heparin Code Status: Full code Family Communication: No family at bedside Disposition Plan: SNF once feeding tube issue has been sorted out.  Status is: Inpatient Remains inpatient appropriate because: Intracranial hemorrhage, dysphagia     Medications: Scheduled:  amLODipine  10 mg Per Tube Daily   vitamin B-12  1,000 mcg Per Tube Daily   feeding supplement  237 mL Oral Q24H   folic acid  1 mg Per Tube Daily   free water   100 mL Per Tube Q4H   heparin injection (subcutaneous)  5,000 Units Subcutaneous Q8H   hydrocortisone cream   Topical BID   metoprolol tartrate  50 mg Per Tube BID   multivitamin with minerals  1 tablet Per Tube Daily   mouth rinse  15 mL Mouth Rinse 4 times per day   pantoprazole  40 mg Oral Daily   QUEtiapine  25 mg Per Tube BID   senna-docusate  1 tablet Per Tube BID   sodium chloride flush  3 mL Intravenous Once   thiamine  100 mg Per Tube Daily   Continuous:  feeding supplement (JEVITY 1.5 CAL/FIBER) 1,000 mL (12/02/21 1950)   VHQ:[id number] **OR** acetaminophen (TYLENOL) oral liquid 160 mg/5 mL **OR** acetaminophen, ipratropium-albuterol, labetalol, LORazepam, mouth rinse  Antibiotics: Anti-infectives (From admission, onward)    Start     Dose/Rate Route Frequency Ordered Stop   11/24/21 1530  Ampicillin-Sulbactam (UNASYN) 3 g in sodium chloride 0.9 % 100 mL IVPB        3 g 200 mL/hr over 30 Minutes Intravenous Every 6 hours 11/24/21 1437 11/28/21 2110       Objective:  Vital Signs  Vitals:   12/02/21 1945 12/02/21 2355 12/03/21 0358 12/03/21 0403  BP: 125/78 126/82  122/77  Pulse: 81 82  76  Resp: 16 16  17   Temp: 98 F (36.7 C) 98.4 F (36.9 C)  98.1 F (36.7 C)  TempSrc: Oral Oral  Oral  SpO2: 98% 97%  98%  Weight:   56.4 kg   Height:        Intake/Output Summary (Last 24 hours) at 12/03/2021 1019 Last data filed at 12/03/2021 0933 Gross per 24 hour  Intake 300 ml  Output 900 ml  Net -600 ml    Filed Weights   12/01/21 0500 12/02/21 0448 12/03/21 0358  Weight: 50.9 kg 53.6 kg 56.4 kg    General appearance: Awake alert.  In no distress NG tube is noted Resp: Clear to auscultation bilaterally.  Normal effort Cardio: S1-S2 is normal regular.  No S3-S4.  No rubs murmurs or bruit GI: Abdomen is soft.  Nontender nondistended.  Bowel sounds are present normal.  No masses organomegaly Extremities: No edema.      Lab Results:  Data Reviewed:  I have personally reviewed following labs and reports of the imaging studies  CBC: Recent Labs  Lab 11/30/21 0231 12/03/21 0325  WBC 10.8* 10.4  HGB 12.5* 12.1*  HCT 36.3* 33.9*  MCV 105.8* 102.4*  PLT 352 409*     Basic Metabolic Panel: Recent Labs  Lab 11/27/21 0327 11/29/21 0710 11/30/21 0231 12/03/21 0325  NA 142 143 139 135  K 3.5 3.6 3.6 4.3  CL 108 103 104 100  CO2 24 23 21* 22  GLUCOSE 92 125* 106* 120*  BUN 16 16 14 17   CREATININE 0.70 0.72 0.71 0.74  CALCIUM 9.6 9.7 9.2 9.3  MG 1.7 1.8 2.0 1.8  PHOS  --   --  4.8*  --      GFR: Estimated Creatinine Clearance: 76.4 mL/min (by C-G formula based on SCr of 0.74 mg/dL).  Liver Function Tests: Recent Labs  Lab 11/30/21 0231 12/03/21 0325  AST 92* 91*  ALT 70* 97*  ALKPHOS 126 118  BILITOT 0.3 0.3  PROT 7.5  7.1  ALBUMIN 3.1* 2.8*      CBG: Recent Labs  Lab 12/02/21 1608 12/02/21 1947 12/02/21 2352 12/03/21 0403 12/03/21 0729  GLUCAP 110* 108* 138* 104* 116*       Recent Results (from the past 240 hour(s))  Urine Culture     Status: None   Collection Time: 11/25/21 10:32 AM   Specimen: Urine, Clean Catch  Result Value Ref Range Status   Specimen Description URINE, CLEAN CATCH  Final   Special Requests NONE  Final   Culture   Final    NO GROWTH Performed at [facility], [address] [place], [place] [address]    Report Status 11/26/2021 FINAL  Final  Culture, blood (Routine X 2) w Reflex to ID Panel     Status: None   Collection Time: 11/25/21 10:56 AM   Specimen: BLOOD LEFT ARM  Result Value Ref Range Status   Specimen Description BLOOD LEFT ARM  Final   Special Requests   Final    IN PEDIATRIC BOTTLE Blood Culture results may not be optimal due to an inadequate volume of blood received in culture bottles   Culture   Final    NO GROWTH 5 DAYS Performed at [facility], [address] [place], [place] [address]    Report Status 11/30/2021 FINAL  Final   Culture, blood (Routine X 2) w Reflex to ID Panel     Status: None   Collection Time: 11/25/21 11:13 AM   Specimen: BLOOD LEFT HAND  Result Value Ref Range Status   Specimen Description BLOOD LEFT HAND  Final   Special Requests   Final    BOTTLES DRAWN AEROBIC AND ANAEROBIC Blood Culture adequate volume   Culture   Final    NO GROWTH 5 DAYS Performed at [facility], [address], [place], [place] [address]    Report Status 11/30/2021 FINAL  Final      Radiology Studies: No results found.     LOS: 15 days   Chriss Redel [employer] on [email or web address]  12/03/2021, 10:19 AM

## 2021-12-04 DIAGNOSIS — I610 Nontraumatic intracerebral hemorrhage in hemisphere, subcortical: Secondary | ICD-10-CM | POA: Diagnosis not present

## 2021-12-04 DIAGNOSIS — R1084 Generalized abdominal pain: Secondary | ICD-10-CM

## 2021-12-04 DIAGNOSIS — I471 Supraventricular tachycardia, unspecified: Secondary | ICD-10-CM | POA: Diagnosis not present

## 2021-12-04 DIAGNOSIS — R451 Restlessness and agitation: Secondary | ICD-10-CM | POA: Diagnosis not present

## 2021-12-04 DIAGNOSIS — R1312 Dysphagia, oropharyngeal phase: Secondary | ICD-10-CM | POA: Diagnosis not present

## 2021-12-04 DIAGNOSIS — I10 Essential (primary) hypertension: Secondary | ICD-10-CM | POA: Diagnosis not present

## 2021-12-04 LAB — GLUCOSE, CAPILLARY
Glucose-Capillary: 104 mg/dL — ABNORMAL HIGH (ref 70–99)
Glucose-Capillary: 111 mg/dL — ABNORMAL HIGH (ref 70–99)
Glucose-Capillary: 111 mg/dL — ABNORMAL HIGH (ref 70–99)
Glucose-Capillary: 113 mg/dL — ABNORMAL HIGH (ref 70–99)
Glucose-Capillary: 153 mg/dL — ABNORMAL HIGH (ref 70–99)
Glucose-Capillary: 155 mg/dL — ABNORMAL HIGH (ref 70–99)

## 2021-12-04 MED ORDER — JEVITY 1.5 CAL/FIBER PO LIQD
1000.0000 mL | ORAL | Status: DC
  Administered 2021-12-04 – 2021-12-06 (×3): 1000 mL
  Filled 2021-12-04 (×4): qty 1000

## 2021-12-04 MED ORDER — LOPERAMIDE HCL 2 MG PO CAPS: 4.0000 mg | ORAL_CAPSULE | Freq: Three times a day (TID) | ORAL | Status: DC | PRN

## 2021-12-04 MED ORDER — ACETAMINOPHEN 160 MG/5ML PO SOLN
650.0000 mg | Freq: Once | ORAL | Status: AC
  Administered 2021-12-04: 650 mg
  Filled 2021-12-04: qty 20.3

## 2021-12-04 NOTE — Progress Notes (Addendum)
Nutrition Follow-up  DOCUMENTATION CODES:  Severe malnutrition in context of social or environmental circumstances  INTERVENTION:  Continue current diet as ordered per SLP Feeding assistance and automatic trays Ensure Enlive 1x/d 350kcal and 20g protein Continue nocturnal TF regimen via cortrak: Jevity 1.5 x 42mL/h x 12 hours, 914mL/d (8PM-8AM) Free water q4h Provides 1350kcal, 57g of protein, and of fluid 48-hour kcal count to determine if pt is taking in adequate PO to dc cortrak and feeds.  NUTRITION DIAGNOSIS:  Severe Malnutrition related to social / environmental circumstances as evidenced by severe fat depletion, severe muscle depletion. - remains applicable  GOAL:  Patient will meet greater than or equal to 90% of their needs - progressing, diet in place, nocturnal TF infusing  MONITOR:  Diet advancement, TF tolerance, Labs  REASON FOR ASSESSMENT:  Consult Calorie Count, Enteral/tube feeding initiation and management  ASSESSMENT:  61 y.o. male with PMH HTN, alcohol abuse, and COPD who presented after ICH.  11/13 SLP rec'd NPO 11/15 Cortrak placed, starting tube feeds 11/21 - MBS, DYS2, thins 11/24 - TF adjusted to nocturnal  New consult received for adjustment of TF to nocturnal and kcal count to determine PO intake. TF adjusted 11/24 per SLP request and has been well tolerated. Noted pt worked with SLP today who reports appetite is still poor despite being on nocturnal feeds for several days and diet was unable to be further advanced. Will monitor intake and determine if TF rate needs to be adjusted.  Discussed with RN and hung envelope on door.  Average Meal Intake: 11/21-11/27: 37% average intake x 3 recorded meals  Nutritionally Relevant Medications: Scheduled Meds:  vitamin B-12  1,000 mcg Per Tube Daily   feeding supplement  237 mL Oral Q24H   folic acid  1 mg Per Tube Daily   free water  100 mL Per Tube Q4H   multivitamin with minerals  1  tablet Per Tube Daily   pantoprazole  40 mg Oral Daily   thiamine  100 mg Per Tube Daily   Continuous Infusions:  feeding supplement (JEVITY 1.5 CAL/FIBER) Stopped (12/04/21 0827)   PRN Meds: loperamide  Labs Reviewed  NUTRITION - FOCUSED PHYSICAL EXAM: Flowsheet Row Most Recent Value  Orbital Region Severe depletion  Upper Arm Region Severe depletion  Thoracic and Lumbar Region Severe depletion  Buccal Region Moderate depletion  Temple Region Severe depletion  Clavicle Bone Region Severe depletion  Clavicle and Acromion Bone Region Severe depletion  Scapular Bone Region Unable to assess  Dorsal Hand Unable to assess  Patellar Region Severe depletion  Anterior Thigh Region Severe depletion  Posterior Calf Region Severe depletion  Edema (RD Assessment) None  Hair Reviewed  Eyes Unable to assess  Mouth Unable to assess  Skin Unable to assess  Nails Reviewed   Diet Order:   Diet Order             DIET DYS 2 Room service appropriate? No; Fluid consistency: Thin  Diet effective now                   EDUCATION NEEDS:  Not appropriate for education at this time  Skin:  Skin Assessment: Reviewed RN Assessment  Last BM:  11/27 - type 4  Height:  Ht Readings from Last 1 Encounters:  11/22/21 5\' 9"  (1.753 m)    Weight:  Wt Readings from Last 1 Encounters:  12/04/21 54.8 kg    Ideal Body Weight:  72.7 kg  BMI:  Body mass index is 17.84 kg/m.  Estimated Nutritional Needs:  Kcal:  1900-2100 kcal/d Protein:  90-110 g/d Fluid:  1.9-2.1 L/d    Ranell Patrick, RD, LDN Clinical Dietitian RD pager # available in [employer]  After hours/weekend pager # available in [facility]

## 2021-12-04 NOTE — Progress Notes (Signed)
Bladder scan performed showing 508 ml present.  MD notified.  Kenard Gower, RN

## 2021-12-04 NOTE — Progress Notes (Signed)
[facility] PROGRESS NOTE   Brent Warren WYO:[id number] DOB: 03-25-1960 DOA: 11/18/2021  PCP: Marcine Matar, MD  Brief History/Interval Summary: 61 year old male with history of HTN, tobacco use, chronic alcohol use presented with sudden onset slurred speech and right-sided weakness.  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  He was admitted to the neuro ICU, remained stable and transferred to the hospitalist service on 11/13.  Patient also developed alcohol withdrawal during this hospitalization.  Consultants: Neurology.  Critical care medicine.   Subjective/Interval History: Patient complains of abdominal discomfort.  Review of chart does suggest that he is having bowel movements recently.      Assessment/Plan:  Left basal ganglia ICH -Neurology was following and signed off on 11/24/2021 and recommended outpatient follow-up with neurology -Echo showed EF of 60 to 65%.  LDL 62, A1c is 5.1 -PT/OT recommending CIR.  As per CIR, patient is not a good candidate for CIR.   TOC following for SNF placement.  Feeding tube will need to be sorted out before he can be discharged.    Oropharyngeal dysphagia Initially was kept n.p.o. and started on nasogastric tube feedings.   Seen by speech therapy and diet was advanced.  Currently on dysphagia 2 diet.  Continue to follow aspiration precautions. Nutrition is consulted to do calorie count.  Will also change his feeds to nocturnal feeding. Will need assistance with feeding.    Abdominal discomfort Abdomen was mildly tender on palpation.  He has had bowel movements recently.  Will do a bladder scan.  If that is unremarkable we will proceed with abdominal films.  Agitation Patient was started on Seroquel.    Restraints were discontinued. Seems to follow commands.  Not as agitated as before.   Alcohol abuse, alcohol withdrawal with delirium tremens -Patient was noted to have significant alcohol withdrawal  symptoms.  PCCM consulted and have signed off.  Completed phenobarbital taper.  -Vitamin B1 normal -Continue thiamine, folic acid and multivitamin   Essential hypertension Noted to be on amlodipine.  Blood pressure is reasonably well-controlled. Was also started on beta-blocker due to poorly controlled blood pressure as well as episodes of SVT.    PSVT Telemetry showed brief episodes of supraventricular tachycardia on 11/23. Echocardiogram shows normal systolic function.   TSH noted to be 5.0 with a normal free T4 of 0.77. Patient was started on beta-blocker.  Telemetry showed improvement.  Was taken off of telemetry.   Hypokalemia/hyponatremia Improved  Possible aspiration pneumonia/Possible UTI UA suggestive of UTI as well.  Cultures negative. Was started on Unasyn on 11/17.  Completed 5 days of treatment.   Respiratory status noted to be stable.  He is noted to be afebrile.  Vitamin B12 deficiency B12 177.  Folate 12.7.   Continues to cyanocobalamine.     Macrocytic anemia Secondary to alcohol abuse as well as B12 deficiency. Ferritin noted to be 1074.  Iron 95.  TIBC 252.   Elevated LFTs AST and ALT are noted to be minimally elevated.  Possibly from alcohol use.  No recent imaging study of the hepatobiliary system noted in EMR.  Since levels are reasonably stable this can be pursued in the outpatient setting. Hepatitis panel is unremarkable.   Tobacco use, history of emphysema Continue nebs as needed Respiratory status is stable.  Severe protein calorie malnutrition Nutrition Problem: Severe Malnutrition Etiology: social / environmental circumstances Signs/Symptoms: severe fat depletion, severe muscle depletion   DVT Prophylaxis: Subcutaneous heparin Code Status: Full code  Family Communication: No family at bedside Disposition Plan: SNF once feeding tube issue has been sorted out.  Status is: Inpatient Remains inpatient appropriate because: Intracranial  hemorrhage, dysphagia     Medications: Scheduled:  amLODipine  10 mg Per Tube Daily   vitamin B-12  1,000 mcg Per Tube Daily   feeding supplement  237 mL Oral Q24H   folic acid  1 mg Per Tube Daily   free water  100 mL Per Tube Q4H   heparin injection (subcutaneous)  5,000 Units Subcutaneous Q8H   hydrocortisone cream   Topical BID   metoprolol tartrate  50 mg Per Tube BID   multivitamin with minerals  1 tablet Per Tube Daily   mouth rinse  15 mL Mouth Rinse 4 times per day   pantoprazole  40 mg Oral Daily   QUEtiapine  25 mg Per Tube BID   senna-docusate  1 tablet Per Tube BID   sodium chloride flush  3 mL Intravenous Once   thiamine  100 mg Per Tube Daily   Continuous:  feeding supplement (JEVITY 1.5 CAL/FIBER) Stopped (12/04/21 0827)   QMG:[id number] **OR** acetaminophen (TYLENOL) oral liquid 160 mg/5 mL **OR** acetaminophen, ipratropium-albuterol, labetalol, LORazepam, mouth rinse  Antibiotics: Anti-infectives (From admission, onward)    Start     Dose/Rate Route Frequency Ordered Stop   11/24/21 1530  Ampicillin-Sulbactam (UNASYN) 3 g in sodium chloride 0.9 % 100 mL IVPB        3 g 200 mL/hr over 30 Minutes Intravenous Every 6 hours 11/24/21 1437 11/28/21 2110       Objective:  Vital Signs  Vitals:   12/03/21 2011 12/04/21 0301 12/04/21 0304 12/04/21 0730  BP: 131/87 130/84  (!) 120/97  Pulse: 99 86  (!) 110  Resp: 16 16  19   Temp: 99 F (37.2 C) 98.1 F (36.7 C)  (!) 97.4 F (36.3 C)  TempSrc: Oral Oral  Oral  SpO2: 97% 100%  100%  Weight:   54.8 kg   Height:        Intake/Output Summary (Last 24 hours) at 12/04/2021 1031 Last data filed at 12/04/2021 0600 Gross per 24 hour  Intake 810 ml  Output 550 ml  Net 260 ml    Filed Weights   12/02/21 0448 12/03/21 0358 12/04/21 0304  Weight: 53.6 kg 56.4 kg 54.8 kg    General appearance: Awake alert.  In no distress NG tube is noted Resp: Clear to auscultation bilaterally.  Normal  effort Cardio: S1-S2 is normal regular.  No S3-S4.  No rubs murmurs or bruit GI: Abdomen is soft.  Mildly tender diffusely without any rebound negative guarding.  Bowel sounds present but sluggish. Extremities: No edema.      Lab Results:  Data Reviewed: I have personally reviewed following labs and reports of the imaging studies  CBC: Recent Labs  Lab 11/30/21 0231 12/03/21 0325  WBC 10.8* 10.4  HGB 12.5* 12.1*  HCT 36.3* 33.9*  MCV 105.8* 102.4*  PLT 352 409*     Basic Metabolic Panel: Recent Labs  Lab 11/29/21 0710 11/30/21 0231 12/03/21 0325  NA 143 139 135  K 3.6 3.6 4.3  CL 103 104 100  CO2 23 21* 22  GLUCOSE 125* 106* 120*  BUN 16 14 17   CREATININE 0.72 0.71 0.74  CALCIUM 9.7 9.2 9.3  MG 1.8 2.0 1.8  PHOS  --  4.8*  --      GFR: Estimated Creatinine Clearance: 74.2 mL/min (by C-G formula  based on SCr of 0.74 mg/dL).  Liver Function Tests: Recent Labs  Lab 11/30/21 0231 12/03/21 0325  AST 92* 91*  ALT 70* 97*  ALKPHOS 126 118  BILITOT 0.3 0.3  PROT 7.5 7.1  ALBUMIN 3.1* 2.8*      CBG: Recent Labs  Lab 12/03/21 1537 12/03/21 2022 12/04/21 0016 12/04/21 0418 12/04/21 0809  GLUCAP 102* 111* 111* 153* 155*       Recent Results (from the past 240 hour(s))  Urine Culture     Status: None   Collection Time: 11/25/21 10:32 AM   Specimen: Urine, Clean Catch  Result Value Ref Range Status   Specimen Description URINE, CLEAN CATCH  Final   Special Requests NONE  Final   Culture   Final    NO GROWTH Performed at [facility], [address] [place], [place] [address]    Report Status 11/26/2021 FINAL  Final  Culture, blood (Routine X 2) w Reflex to ID Panel     Status: None   Collection Time: 11/25/21 10:56 AM   Specimen: BLOOD LEFT ARM  Result Value Ref Range Status   Specimen Description BLOOD LEFT ARM  Final   Special Requests   Final    IN PEDIATRIC BOTTLE Blood Culture results may not be optimal due to an inadequate  volume of blood received in culture bottles   Culture   Final    NO GROWTH 5 DAYS Performed at [facility], [address] [place], [place] [address]    Report Status 11/30/2021 FINAL  Final  Culture, blood (Routine X 2) w Reflex to ID Panel     Status: None   Collection Time: 11/25/21 11:13 AM   Specimen: BLOOD LEFT HAND  Result Value Ref Range Status   Specimen Description BLOOD LEFT HAND  Final   Special Requests   Final    BOTTLES DRAWN AEROBIC AND ANAEROBIC Blood Culture adequate volume   Culture   Final    NO GROWTH 5 DAYS Performed at [facility], [address] [place], [place] [address]    Report Status 11/30/2021 FINAL  Final      Radiology Studies: No results found.     LOS: 16 days   Cai Anfinson [employer] on [email or web address]  12/04/2021, 10:31 AM

## 2021-12-04 NOTE — Progress Notes (Signed)
Mobility Specialist: Progress Note   12/04/21 1401  Mobility  Activity Transferred to/from [facility]  Level of Assistance Maximum assist, patient does 25-49%  Assistive Device Other (Comment) (HHA)  Distance Ambulated (ft) 5 ft (3'+2')  Activity Response Tolerated fair  $Mobility charge 1 Mobility   Pt received in the bed and requesting to use BSC. ModA with bed mobility and maxA for stand pivot transfer. Cues for foot placement. Physical assist for balance and weight shift to advance RLE. Pt assisted with pericare and then back to bed with help from NT. Bed alarm is on.   Kiesha Ensey Mobility Specialist Please contact via SecureChat or Rehab office at [phone]

## 2021-12-04 NOTE — Progress Notes (Signed)
Physical Therapy Treatment Patient Details Name: Brent Warren MRN: [id number] DOB: 15-Jun-1960 Today's Date: 12/04/2021   History of Present Illness 61 yo male presenting 11/11 with R-sided weakness, facial droop and slurred speech. 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 PMH includes: HTN, COPD, and alcohol abuse.    PT Comments    Pt with incremental progress towards acute goals. Pt able to complete bed mobility with grossly min assist to manage Les and steady at EOB. Pt able to come to standing x3 trials at EOB and maintain for ~20 seconds before needing to sit. Pt demonstrating gait with HHA x1 and max assist for short distance in room with pt demonstrating anteriorly flexed posture despite max cues to stand upright. Current plan remains appropriate to address deficits and maximize functional independence and decrease caregiver burden. Pt continues to benefit from skilled PT services to progress toward functional mobility goals.    Recommendations for follow up therapy are one component of a multi-disciplinary discharge planning process, led by the attending physician.  Recommendations may be updated based on patient status, additional functional criteria and insurance authorization.  Follow Up Recommendations  Acute inpatient rehab (3hours/day)     Assistance Recommended at Discharge Frequent or constant Supervision/Assistance  Patient can return home with the following A lot of help with walking and/or transfers;A lot of help with bathing/dressing/bathroom;Assistance with cooking/housework;Direct supervision/assist for medications management;Direct supervision/assist for financial management;Assist for transportation;Help with stairs or ramp for entrance   Equipment Recommendations  Other (comment)    Recommendations for Other Services       Precautions / Restrictions Precautions Precautions: Fall Precaution Comments: watch HR Restrictions Weight Bearing Restrictions: No      Mobility  Bed Mobility Overal bed mobility: Needs Assistance Bed Mobility: Supine to Sit, Sit to Supine     Supine to sit: Min assist Sit to supine: Min assist   General bed mobility comments: required assistance with BLEs to get to EOB and back to supine    Transfers Overall transfer level: Needs assistance Equipment used: 1 person hand held assist Transfers: Sit to/from Stand, Bed to chair/wheelchair/BSC Sit to Stand: Max assist Stand pivot transfers: Max assist         General transfer comment: able to come to stand x3 from EOB with max assist and stand pivot from chair >EOB    Ambulation/Gait Ambulation/Gait assistance: Max assist Gait Distance (Feet): 5 Feet Assistive device: 1 person hand held assist Gait Pattern/deviations: Step-to pattern, Decreased stride length, Ataxic Gait velocity: decer     General Gait Details: slow step to gait with assist to steady and maintain balance, pt need frequent cues for upright posture and assist to advance RLE   Stairs             Wheelchair Mobility    Modified Rankin (Stroke Patients Only) Modified Rankin (Stroke Patients Only) Pre-Morbid Rankin Score: No symptoms Modified Rankin: Moderately severe disability     Balance Overall balance assessment: Needs assistance Sitting-balance support: Single extremity supported, Feet supported Sitting balance-Leahy Scale: Poor Sitting balance - Comments: reliant on UE support Postural control: Posterior lean Standing balance support: Single extremity supported, During functional activity Standing balance-Leahy Scale: Poor Standing balance comment: reliant on therapist and bed rail for support                            Cognition Arousal/Alertness: Awake/alert Behavior During Therapy: Flat affect, Agitated Overall Cognitive Status:  Difficult to assess Area of Impairment: Following commands, Orientation, Attention, Problem solving, Awareness,  Safety/judgement                 Orientation Level:  (can answer yes no to name and birthdate) Current Attention Level:  (unable to focus due to lethargy)   Following Commands: Follows one step commands with increased time Safety/Judgement: Decreased awareness of safety, Decreased awareness of deficits     General Comments: difficulty to understand, flat affect initially, became agitated once on EOB        Exercises      General Comments        Pertinent Vitals/Pain Pain Assessment Pain Assessment: Faces Pain Score: 0-No pain Pain Intervention(s): Monitored during session    Home Living                          Prior Function            PT Goals (current goals can now be found in the care plan section) Acute Rehab PT Goals Patient Stated Goal: return home PT Goal Formulation: With patient Time For Goal Achievement: 12/03/21    Frequency    Min 3X/week      PT Plan      Co-evaluation              AM-PAC PT "6 Clicks" Mobility   Outcome Measure  Help needed turning from your back to your side while in a flat bed without using bedrails?: A Lot Help needed moving from lying on your back to sitting on the side of a flat bed without using bedrails?: A Lot Help needed moving to and from a bed to a chair (including a wheelchair)?: Total Help needed standing up from a chair using your arms (e.g., wheelchair or bedside chair)?: Total Help needed to walk in hospital room?: Total Help needed climbing 3-5 steps with a railing? : Total 6 Click Score: 8    End of Session Equipment Utilized During Treatment: Gait belt Activity Tolerance: Patient tolerated treatment well Patient left: in bed;with call bell/phone within reach;with bed alarm set Nurse Communication: Mobility status PT Visit Diagnosis: Other abnormalities of gait and mobility (R26.89);Muscle weakness (generalized) (M62.81);Hemiplegia and hemiparesis Hemiplegia - Right/Left:  Right Hemiplegia - dominant/non-dominant: Dominant Hemiplegia - caused by: Nontraumatic intracerebral hemorrhage     Time: 1520-1538 PT Time Calculation (min) (ACUTE ONLY): 18 min  Charges:  $Therapeutic Activity: 8-22 mins                     Qianna Clagett R. PTA Acute Rehabilitation Services Office: [phone]    Catalina Antigua 12/04/2021, 3:46 PM

## 2021-12-04 NOTE — Progress Notes (Signed)
Speech Language Pathology Treatment: Dysphagia;Cognitive-Linquistic  Patient Details Name: Brent Warren MRN: [id number] DOB: 09-07-1960 Today's Date: 12/04/2021 Time: 3664-4034 SLP Time Calculation (min) (ACUTE ONLY): 18 min  Assessment / Plan / Recommendation Clinical Impression  SWALLOWING Pt seen for ongoing dysphagia management.  Pt continues to have limited PO intake.  Pt accepted trials of mechanical soft solids today for possible advancement.  Pt with prolonged oral phase, ineffective mastication, and bolus retention on R side of oral cavity.  There was delayed cough.  Pt did not endorse if solids went down the wrong way.  Inattention to bolus noted with pt talking with low volume mumbling throughout trial while chewing. Pt given puree bolus to follow mechanical soft solids and oral cavity was fully cleared. Pt tolerated thin liquid by straw with no clinical s/s of aspiration.  Pt declined trials of ground meal tray.  Consider downgrade to puree for ease of intake, if pt continues with poor PO intake or indicates distaste for ground texture.  Unfortunately pt is not appropriate for diet upgrade at this time.   Recommend continuing ground/chopped diet with thin liquids at present.   OF NOTE: Oropharynx is very red with red spots reaching to soft palate.  RN in room and notified. CNA took axial temperature during session of 99.8.  RN will notify MD.  SPEECH Pt continues with significant dysarthria. Pt's spontaneous speech is largely intelligible.  With encouragement and consistent maximal cuing, pt is intelligible at word level in known context.  Pt counted reaching 11 before discontinuing dysarthria compensations and becoming unintelligible.  With naming task pt required cuing to speak loudly and articulate every sound in word with over half of items.  Pt stated "pencil" for "straw" and in this instance was intelligible in unknown context.  Pt indicated he would like a hairbrush through  combination of speech and gesture.  With encouragement to use compensatory strategies pt uttered intelligible phrase "Take me with you" when asked if he needed anything.  As with PO intake, inattention appears to be a factor.  Pt is able to communicate effectively at word/phrase level, but required consistent maximal cuing to do so.   HPI HPI: 61 year old male with history of HTN, tobacco use, EtOH use who comes into the hospital with sudden onset slurred speech and right-sided weakness.  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   He started to develop increased withdrawal symptoms on 11/13      SLP Plan  Continue with current plan of care      Recommendations for follow up therapy are one component of a multi-disciplinary discharge planning process, led by the attending physician.  Recommendations may be updated based on patient status, additional functional criteria and insurance authorization.    Recommendations  Diet recommendations: Dysphagia 2 (fine chop);Thin liquid Liquids provided via: Cup;Straw Medication Administration: Whole meds with puree Supervision: Full supervision/cueing for compensatory strategies;Staff to assist with self feeding Compensations: Small sips/bites;Slow rate;Minimize environmental distractions (Check R side of oral cavity for bolus retention) Postural Changes and/or Swallow Maneuvers: Seated upright 90 degrees                Oral Care Recommendations: Oral care BID Follow Up Recommendations: Acute inpatient rehab (3hours/day) Assistance recommended at discharge: Frequent or constant Supervision/Assistance SLP Visit Diagnosis: Dysphagia, unspecified (R13.10) Plan: Continue with current plan of care           Kerrie Pleasure, MA, CCC-SLP Acute Rehabilitation Services Office: [phone] 12/04/2021,  11:48 AM

## 2021-12-05 DIAGNOSIS — I10 Essential (primary) hypertension: Secondary | ICD-10-CM | POA: Diagnosis not present

## 2021-12-05 DIAGNOSIS — I471 Supraventricular tachycardia, unspecified: Secondary | ICD-10-CM | POA: Diagnosis not present

## 2021-12-05 DIAGNOSIS — R1084 Generalized abdominal pain: Secondary | ICD-10-CM | POA: Diagnosis not present

## 2021-12-05 DIAGNOSIS — R1312 Dysphagia, oropharyngeal phase: Secondary | ICD-10-CM | POA: Diagnosis not present

## 2021-12-05 DIAGNOSIS — R451 Restlessness and agitation: Secondary | ICD-10-CM | POA: Diagnosis not present

## 2021-12-05 DIAGNOSIS — I610 Nontraumatic intracerebral hemorrhage in hemisphere, subcortical: Secondary | ICD-10-CM | POA: Diagnosis not present

## 2021-12-05 LAB — GLUCOSE, CAPILLARY
Glucose-Capillary: 118 mg/dL — ABNORMAL HIGH (ref 70–99)
Glucose-Capillary: 122 mg/dL — ABNORMAL HIGH (ref 70–99)
Glucose-Capillary: 124 mg/dL — ABNORMAL HIGH (ref 70–99)
Glucose-Capillary: 129 mg/dL — ABNORMAL HIGH (ref 70–99)
Glucose-Capillary: 131 mg/dL — ABNORMAL HIGH (ref 70–99)
Glucose-Capillary: 148 mg/dL — ABNORMAL HIGH (ref 70–99)

## 2021-12-05 MED ORDER — QUETIAPINE FUMARATE 50 MG PO TABS
50.0000 mg | ORAL_TABLET | Freq: Two times a day (BID) | ORAL | Status: DC
  Administered 2021-12-05 – 2021-12-15 (×21): 50 mg
  Filled 2021-12-05 (×22): qty 1

## 2021-12-05 MED ORDER — HALOPERIDOL LACTATE 5 MG/ML IJ SOLN
5.0000 mg | Freq: Once | INTRAMUSCULAR | Status: AC | PRN
  Administered 2021-12-05: 5 mg via INTRAVENOUS
  Filled 2021-12-05: qty 1

## 2021-12-05 NOTE — Progress Notes (Signed)
[facility] night cross cover note:  I was notified by RN that this patient remains agitated, attempting to get out of bed, with these actions refractory following prn dose of IV haldol, 2 doses of prn iv ativan, and attempts at verbal redirection.   In the setting of associated interference with ongoing medical treatment posing potential harm to themself, I have placed orders for soft bilateral wrist restraints and waist restraint.   Newton Pigg, DO Hospitalist

## 2021-12-05 NOTE — Progress Notes (Signed)
[facility] PROGRESS NOTE   Brent Warren [id number] DOB: 1960-10-11 DOA: 11/18/2021  PCP: Ladell Pier, MD  Brief History/Interval Summary: 61 year old male with history of HTN, tobacco use, chronic alcohol use presented with sudden onset slurred speech and right-sided weakness.  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  He was admitted to the neuro ICU, remained stable and transferred to the hospitalist service on 11/13.  Patient also developed alcohol withdrawal during this hospitalization.  Consultants: Neurology.  Critical care medicine.   Subjective/Interval History: Overnight events noted.  Patient had to be placed back on restraints.  Does not appear to be in any discomfort.  Denies any pain issues this morning.  Patient was told the importance of not trying to get out of the bed and complying with nursing instructions.  Unclear how much of this he understands.   Assessment/Plan:  Left basal ganglia ICH -Neurology was following and signed off on 11/24/2021 and recommended outpatient follow-up with neurology -Echo showed EF of 60 to 65%.  LDL 62, A1c is 5.1 -PT/OT recommending CIR.  As per CIR, patient is not a good candidate for CIR.   TOC following for SNF placement.  Feeding tube will need to be sorted out before he can be discharged.   Continues to have significant speech impediments.  Oropharyngeal dysphagia Initially was kept n.p.o. and started on nasogastric tube feedings.   Seen by speech therapy and diet was advanced.  Currently on dysphagia 2 diet.  Continue to follow aspiration precautions. Nutrition is consulted to do calorie count.  Feeds were also changed to nocturnal.  Will need assistance with feeding. If his caloric intake is insufficient and if he does not show further improvement in the dysphagia then we may have to consider PEG tube.  His agitation will need to be well-controlled before PEG tube placement can be considered.     Abdominal discomfort on 11/27 Reason for this was not entirely clear.  He did have multiple bowel movements after which his symptoms subsided.  Bladder scan did show greater than 400 mL but then he was able to void on his own.  Abdomen is benign this morning.  Continue to monitor.    Agitation Patient was started on Seroquel.  For a while it looks like patient's agitation had improved.  His restraints were removed.   However overnight on 11/27-28 restraints had to be reapplied.  Will increase the dose of Seroquel.   Alcohol abuse, alcohol withdrawal with delirium tremens Patient was noted to have significant alcohol withdrawal symptoms.  PCCM consulted and have signed off.  Completed phenobarbital taper.  Vitamin B1 normal Continue thiamine, folic acid and multivitamin   Essential hypertension Noted to be on amlodipine.  Blood pressure is reasonably well-controlled. Was also started on beta-blocker due to poorly controlled blood pressure as well as episodes of SVT.    PSVT Telemetry showed brief episodes of supraventricular tachycardia on 11/23. Echocardiogram shows normal systolic function.   TSH noted to be 5.0 with a normal free T4 of 0.77. Patient was started on beta-blocker.  Telemetry showed improvement.  Subsequently taken off of telemetry.   Hypokalemia/hyponatremia Improved.  Recheck labs tomorrow.  Possible aspiration pneumonia/Possible UTI UA suggestive of UTI as well.  Cultures negative. Was started on Unasyn on 11/17.  Completed 5 days of treatment.   Respiratory status noted to be stable.  He is noted to be afebrile.  Vitamin B12 deficiency B12 177.  Folate 12.7.  Continues to cyanocobalamine.     Macrocytic anemia Secondary to alcohol abuse as well as B12 deficiency. Ferritin noted to be 1074.  Iron 95.  TIBC 252.   Elevated LFTs AST and ALT are noted to be minimally elevated.  Possibly from alcohol use.  No recent imaging study of the hepatobiliary system  noted in EMR.   Since levels are reasonably stable this can be pursued in the outpatient setting. Hepatitis panel is unremarkable.   Tobacco use, history of emphysema Continue nebs as needed Respiratory status is stable.  Severe protein calorie malnutrition Nutrition Problem: Severe Malnutrition Etiology: social / environmental circumstances Signs/Symptoms: severe fat depletion, severe muscle depletion   DVT Prophylaxis: Subcutaneous heparin Code Status: Full code Family Communication: No family at bedside Disposition Plan: SNF once feeding tube issue has been sorted out.  Status is: Inpatient Remains inpatient appropriate because: Intracranial hemorrhage, dysphagia     Medications: Scheduled:  amLODipine  10 mg Per Tube Daily   vitamin B-12  1,000 mcg Per Tube Daily   feeding supplement  237 mL Oral Q24H   feeding supplement (JEVITY 1.5 CAL/FIBER)  1,000 mL Per Tube [id number]   folic acid  1 mg Per Tube Daily   free water  100 mL Per Tube Q4H   heparin injection (subcutaneous)  5,000 Units Subcutaneous Q8H   hydrocortisone cream   Topical BID   metoprolol tartrate  50 mg Per Tube BID   multivitamin with minerals  1 tablet Per Tube Daily   mouth rinse  15 mL Mouth Rinse 4 times per day   pantoprazole  40 mg Oral Daily   QUEtiapine  50 mg Per Tube BID   sodium chloride flush  3 mL Intravenous Once   thiamine  100 mg Per Tube Daily   Continuous:   [id number] **OR** acetaminophen (TYLENOL) oral liquid 160 mg/5 mL **OR** acetaminophen, ipratropium-albuterol, labetalol, loperamide, LORazepam, mouth rinse  Antibiotics: Anti-infectives (From admission, onward)    Start     Dose/Rate Route Frequency Ordered Stop   11/24/21 1530  Ampicillin-Sulbactam (UNASYN) 3 g in sodium chloride 0.9 % 100 mL IVPB        3 g 200 mL/hr over 30 Minutes Intravenous Every 6 hours 11/24/21 1437 11/28/21 2110       Objective:  Vital Signs  Vitals:   12/05/21 0054 12/05/21 0426  12/05/21 0454 12/05/21 0815  BP: 118/87 128/81  (!) 142/81  Pulse: 86 93  86  Resp: 15 19  18   Temp: 99.1 F (37.3 C) 99.7 F (37.6 C)  98.1 F (36.7 C)  TempSrc: Axillary Axillary  Axillary  SpO2: 100% 100%  100%  Weight:   54.7 kg   Height:        Intake/Output Summary (Last 24 hours) at 12/05/2021 1016 Last data filed at 12/05/2021 0500 Gross per 24 hour  Intake --  Output 575 ml  Net -575 ml    Filed Weights   12/03/21 0358 12/04/21 0304 12/05/21 0454  Weight: 56.4 kg 54.8 kg 54.7 kg    General appearance: Awake alert.  In no distress.  Mildly agitated but easily redirectable. NG feeding tube is noted Resp: Clear to auscultation bilaterally.  Normal effort Cardio: S1-S2 is normal regular.  No S3-S4.  No rubs murmurs or bruit GI: Abdomen is soft.  Nontender nondistended.  Bowel sounds are present normal.  No masses organomegaly Extremities: No edema.  Right hemiparesis noted.    Lab Results:  Data Reviewed: I have  personally reviewed following labs and reports of the imaging studies  CBC: Recent Labs  Lab 11/30/21 0231 12/03/21 0325  WBC 10.8* 10.4  HGB 12.5* 12.1*  HCT 36.3* 33.9*  MCV 105.8* 102.4*  PLT 352 409*     Basic Metabolic Panel: Recent Labs  Lab 11/29/21 0710 11/30/21 0231 12/03/21 0325  NA 143 139 135  K 3.6 3.6 4.3  CL 103 104 100  CO2 23 21* 22  GLUCOSE 125* 106* 120*  BUN 16 14 17   CREATININE 0.72 0.71 0.74  CALCIUM 9.7 9.2 9.3  MG 1.8 2.0 1.8  PHOS  --  4.8*  --      GFR: Estimated Creatinine Clearance: 74.1 mL/min (by C-G formula based on SCr of 0.74 mg/dL).  Liver Function Tests: Recent Labs  Lab 11/30/21 0231 12/03/21 0325  AST 92* 91*  ALT 70* 97*  ALKPHOS 126 118  BILITOT 0.3 0.3  PROT 7.5 7.1  ALBUMIN 3.1* 2.8*      CBG: Recent Labs  Lab 12/04/21 1632 12/04/21 2000 12/04/21 2359 12/05/21 0430 12/05/21 0812  GLUCAP 113* 104* 124* 122* 148*       Recent Results (from the past 240 hour(s))   Urine Culture     Status: None   Collection Time: 11/25/21 10:32 AM   Specimen: Urine, Clean Catch  Result Value Ref Range Status   Specimen Description URINE, CLEAN CATCH  Final   Special Requests NONE  Final   Culture   Final    NO GROWTH Performed at [facility], [address], [place], [place]    Report Status 11/26/2021 FINAL  Final  Culture, blood (Routine X 2) w Reflex to ID Panel     Status: None   Collection Time: 11/25/21 10:56 AM   Specimen: BLOOD LEFT ARM  Result Value Ref Range Status   Specimen Description BLOOD LEFT ARM  Final   Special Requests   Final    IN PEDIATRIC BOTTLE Blood Culture results may not be optimal due to an inadequate volume of blood received in culture bottles   Culture   Final    NO GROWTH 5 DAYS Performed at [facility], [address], [place], [place]    Report Status 11/30/2021 FINAL  Final  Culture, blood (Routine X 2) w Reflex to ID Panel     Status: None   Collection Time: 11/25/21 11:13 AM   Specimen: BLOOD LEFT HAND  Result Value Ref Range Status   Specimen Description BLOOD LEFT HAND  Final   Special Requests   Final    BOTTLES DRAWN AEROBIC AND ANAEROBIC Blood Culture adequate volume   Culture   Final    NO GROWTH 5 DAYS Performed at [facility], [address] [place], [place]    Report Status 11/30/2021 FINAL  Final      Radiology Studies: No results found.     LOS: 17 days   Jovanne Riggenbach 12/02/2021 on [email or web address]  12/05/2021, 10:16 AM

## 2021-12-05 NOTE — Progress Notes (Signed)
Calorie Count Note  48-hour calorie count ordered.  Extremely poor intake noted over the last 24 hours. See below for totals.  Diet: DYS2, thins Supplements: Ensure Enlive 1x/d  Estimated Nutritional Needs:  Kcal:  1900-2100 kcal/d Protein:  90-110 g/d Fluid:  1.9-2.1 L/d  Lunch: 59kcal 1g of protein Dinner: 144kcal, 8g protein Breakfast: 2 bites per RN, no kcal or protein Supplements: refused  Total intake: 203 kcal (11% of minimum estimated needs)  9 protein (10% of minimum estimated needs)  NUTRITION DIAGNOSIS:  Severe Malnutrition related to social / environmental circumstances as evidenced by severe fat depletion, severe muscle depletion. - remains applicable   GOAL:  Patient will meet greater than or equal to 90% of their needs - progressing, diet in place, nocturnal TF infusing  INTERVENTION:  Continue current diet as ordered per SLP Feeding assistance and automatic trays Ensure Enlive 1x/d 350kcal and 20g protein Continue nocturnal TF regimen via cortrak: Jevity 1.5 x 77mL/h x 12 hours, 94mL/d (8PM-8AM) Free water q4h Provides 1350kcal, 57g of protein, and of fluid 48-hour kcal count to determine if pt is taking in adequate PO to dc cortrak and feeds.   Greig Castilla, RD, LDN Clinical Dietitian RD pager # available in AMION  After hours/weekend pager # available in [facility]

## 2021-12-05 NOTE — TOC Initial Note (Signed)
Transition of Care [facility]) - Initial/Assessment Note    Patient Details  Name: Brent Warren MRN: [id number] Date of Birth: January 09, 1961  [facility]) CM/SW Contact:    Jinger Neighbors, LCSW Phone Number: 12/05/2021, 9:46 AM  Clinical Narrative:                 Met face to face with pt on 11/27. Pt from home, living with brother. Pt is aax2, minus time and place. Pt is amenable to  SNF, once medically cleared.   Expected Discharge Plan: Skilled Nursing Facility Barriers to Discharge: Continued Medical Work up, No SNF bed, SNF Pending transportation   Patient Goals and CMS Choice Patient states their goals for this hospitalization and ongoing recovery are:: Patient states he does not know what his goals are for this hospitalization CMS Medicare.gov Compare Post Acute Care list provided to:: Patient Choice offered to / list presented to : Patient  Expected Discharge Plan and Services Expected Discharge Plan: [place] Choice: [place] arrangements for the past 2 months: Single Family Home                                      Prior Living Arrangements/Services Living arrangements for the past 2 months: Single Family Home Lives with:: Siblings Patient language and need for interpreter reviewed:: Yes Do you feel safe going back to the place where you live?: Yes      Need for Family Participation in Patient Care: Yes (Comment) Care giver support system in place?: Yes (comment)   Criminal Activity/Legal Involvement Pertinent to Current Situation/Hospitalization: No - Comment as needed  Activities of Daily Living Home Assistive Devices/Equipment: None ADL Screening (condition at time of admission) Patient's cognitive ability adequate to safely complete daily activities?: Yes Is the patient deaf or have difficulty hearing?: No Does the patient have difficulty seeing, even when wearing glasses/contacts?:  No Does the patient have difficulty concentrating, remembering, or making decisions?: No Patient able to express need for assistance with ADLs?: Yes Does the patient have difficulty dressing or bathing?: No Independently performs ADLs?: Yes (appropriate for developmental age) Does the patient have difficulty walking or climbing stairs?: No Weakness of Legs: Right Weakness of Arms/Hands: Right  Permission Sought/Granted Permission sought to share information with : Facility Sport and exercise psychologist, Family Supports Permission granted to share information with : Yes, Verbal Permission Granted  Share Information with NAME: Donivan Thammavong     Permission granted to share info w Relationship: Brother  Permission granted to share info w Contact Information: [phone]  Emotional Assessment   Attitude/Demeanor/Rapport: Engaged Affect (typically observed): Adaptable Orientation: : Oriented to Self, Oriented to Place (Pt was not aware of current month and believed it to be October) Alcohol / Substance Use: Alcohol Use (pt reports drinking 4-6 cans of beer daily) Psych Involvement: No (comment)  Admission diagnosis:  ICH (intracerebral hemorrhage) ([place]) [I61.9] Left-sided nontraumatic intracerebral hemorrhage, unspecified cerebral location [facility]) [I61.9] Patient Active Problem List   Diagnosis Date Noted   Protein-calorie malnutrition, severe 11/22/2021   Alcohol withdrawal delirium ([place]) 11/20/2021   Elevated MCV 11/20/2021   ICH (intracerebral hemorrhage) ([place]) 11/18/2021   COVID-19 vaccine series completed 10/26/2019   Polyarthritis 02/05/2019   Chest pain in adult 04/02/2017   Immunization due 11/06/2016   Pulmonary emphysema ([place]) 11/06/2016   Urge incontinence 11/06/2016   Tobacco  abuse 11/06/2016   ETOH abuse 11/06/2016   Lung nodule, multiple 11/08/2015   HTN (hypertension) 08/01/2015   Hyponatremia 05/22/2015   Hepatitis 05/22/2015   PCP:  Ladell Pier, MD Pharmacy:    [facility] [address], [place] [address] Phone: [phone] Fax: [facility] [address] [place] [address] Phone: [phone] Fax: [phone]     Social Determinants of Health (SDOH) Interventions    Readmission Risk Interventions     No data to display

## 2021-12-05 NOTE — Progress Notes (Signed)
[facility] night cross cover note:   I was notified by RN that this patient has been agitated tonight, attempting to get out of bed and also sliding himself down in bed in the process, a/w concerns for increased aspiration risk as the patient is also on tube feeds. Patient's actions have been refractory to 2 doses of ativan during tonight's shift as well as refractory to verbal attempts at redirection. As it appears that the patient's actions are a/w increased risk of harm towards himself, I'm placing order for one time dose of Haldol 5 mg IV prn for agitation.     Newton Pigg, DO Hospitalist

## 2021-12-06 DIAGNOSIS — I610 Nontraumatic intracerebral hemorrhage in hemisphere, subcortical: Secondary | ICD-10-CM | POA: Diagnosis not present

## 2021-12-06 LAB — COMPREHENSIVE METABOLIC PANEL
ALT: 90 U/L — ABNORMAL HIGH (ref 0–44)
AST: 71 U/L — ABNORMAL HIGH (ref 15–41)
Albumin: 3 g/dL — ABNORMAL LOW (ref 3.5–5.0)
Alkaline Phosphatase: 159 U/L — ABNORMAL HIGH (ref 38–126)
Anion gap: 11 (ref 5–15)
BUN: 18 mg/dL (ref 8–23)
CO2: 23 mmol/L (ref 22–32)
Calcium: 9.2 mg/dL (ref 8.9–10.3)
Chloride: 99 mmol/L (ref 98–111)
Creatinine, Ser: 0.76 mg/dL (ref 0.61–1.24)
GFR, Estimated: >60 mL/min (ref >60)
Glucose, Bld: 129 mg/dL — ABNORMAL HIGH (ref 70–99)
Potassium: 4.2 mmol/L (ref 3.5–5.1)
Sodium: 133 mmol/L — ABNORMAL LOW (ref 135–145)
Total Bilirubin: 0.1 mg/dL — ABNORMAL LOW (ref 0.3–1.2)
Total Protein: 7.4 g/dL (ref 6.5–8.1)

## 2021-12-06 LAB — GLUCOSE, CAPILLARY
Glucose-Capillary: 135 mg/dL — ABNORMAL HIGH (ref 70–99)
Glucose-Capillary: 121 mg/dL — ABNORMAL HIGH (ref 70–99)
Glucose-Capillary: 104 mg/dL — ABNORMAL HIGH (ref 70–99)
Glucose-Capillary: 121 mg/dL — ABNORMAL HIGH (ref 70–99)
Glucose-Capillary: 108 mg/dL — ABNORMAL HIGH (ref 70–99)
Glucose-Capillary: 116 mg/dL — ABNORMAL HIGH (ref 70–99)
Glucose-Capillary: 120 mg/dL — ABNORMAL HIGH (ref 70–99)

## 2021-12-06 LAB — CBC
HCT: 36 % — ABNORMAL LOW (ref 39.0–52.0)
Hemoglobin: 12.7 g/dL — ABNORMAL LOW (ref 13.0–17.0)
MCH: 36.2 pg — ABNORMAL HIGH (ref 26.0–34.0)
MCHC: 35.3 g/dL (ref 30.0–36.0)
MCV: 102.6 fL — ABNORMAL HIGH (ref 80.0–100.0)
Platelets: 531 10*3/uL — ABNORMAL HIGH (ref 150–400)
RBC: 3.51 MIL/uL — ABNORMAL LOW (ref 4.22–5.81)
RDW: 12.6 % (ref 11.5–15.5)
WBC: 11.1 10*3/uL — ABNORMAL HIGH (ref 4.0–10.5)
nRBC: 0 % (ref 0.0–0.2)

## 2021-12-06 LAB — MAGNESIUM: Magnesium: 1.9 mg/dL (ref 1.7–2.4)

## 2021-12-06 LAB — PHOSPHORUS: Phosphorus: 4.7 mg/dL — ABNORMAL HIGH (ref 2.5–4.6)

## 2021-12-06 MED ORDER — ENSURE ENLIVE PO LIQD
237.0000 mL | Freq: Three times a day (TID) | ORAL | Status: DC
  Administered 2021-12-06 – 2021-12-28 (×62): 237 mL via ORAL

## 2021-12-06 NOTE — Progress Notes (Signed)
Calorie Count Note  48-hour calorie count ordered.  Pt with continued poor PO intake, not meeting needs orally. No a candidate at this time for discontinuing TF as he is extremely malnourished.  Diet: DYS2, thins Supplements: Ensure Enlive 1x/d  Estimated Nutritional Needs:  Kcal:  1900-2100 kcal/d Protein:  90-110 g/d Fluid:  1.9-2.1 L/d  11/28 Lunch: 116kcal, 3g of protein 11/28 Dinner: No ticket documented 11/29 Breakfast: 212kcal, 6g of protein 11/29 Lunch 130kcal, 11g protein  Total intake: 458 kcal (24% of minimum estimated needs)  20 protein (22% of minimum estimated needs)  NUTRITION DIAGNOSIS:  Severe Malnutrition related to social / environmental circumstances as evidenced by severe fat depletion, severe muscle depletion. - remains applicable   GOAL:  Patient will meet greater than or equal to 90% of their needs - progressing, diet in place, nocturnal TF infusing  INTERVENTION:  Continue current diet as ordered per SLP Feeding assistance and automatic trays Increase Ensure Enlive 3x/d 350kcal and 20g protein Continue nocturnal TF regimen via cortrak: Jevity 1.5 x 8mL/h x 12 hours, 9104mL/d (8PM-8AM) Free water q4h Provides 1350kcal, 57g of protein, and of fluid  Greig Castilla, RD, LDN Clinical Dietitian RD pager # available in AMION  After hours/weekend pager # available in [facility]

## 2021-12-06 NOTE — Progress Notes (Signed)
[facility] PROGRESS NOTE   Brent Warren MWN:[id number] DOB: 15-Sep-1960 DOA: 11/18/2021  PCP: Marcine Matar, MD  Brief History/Interval Summary: 61 year old male with history of HTN, tobacco use, chronic alcohol use presented with sudden onset slurred speech and right-sided weakness.  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  He was admitted to the neuro ICU, remained stable and transferred to the hospitalist service on 11/13.  Patient also developed alcohol withdrawal during this hospitalization.  Consultants: Neurology.  Critical care medicine.   Subjective/Interval History: No acute issues or events noted overnight, continues in restraints, somewhat impulsive, difficult to converse with the patient but he does follow simple commands, unable to answer questions due to rapid unintelligible speech.  Unclear if he understands his current situation or hospital admission.  Assessment/Plan:  Left basal ganglia ICH -Neurology was following and signed off on 11/24/2021 and recommended outpatient follow-up with neurology -Echo showed EF of 60 to 65%.  LDL 62, A1c is 5.1 -PT/OT recommending CIR.  As per CIR, patient is not a good candidate for CIR.   TOC following for SNF placement.  Feeding tube will need to be sorted out before he can be discharged.   Continues to have significant speech impediments.  Oropharyngeal dysphagia Initially was kept n.p.o. and started on nasogastric tube feedings.   Seen by speech therapy and diet was advanced.  Currently on dysphagia 2 diet.  Continue to follow aspiration precautions. Nutrition is consulted to do calorie count.  Feeds were also changed to nocturnal.  Will need assistance with feeding. If his caloric intake is insufficient and if he does not show further improvement in the dysphagia then we may have to consider PEG tube.  His agitation will need to be well-controlled before PEG tube placement can be considered due to  risk factor of removal.    Abdominal discomfort, unspecified, on 11/27 -resolved Reason for this was not entirely clear.  He did have multiple bowel movements after which his symptoms subsided.  Bladder scan did show greater than 400 mL but then he was able to void on his own.  Abdomen is benign this morning.  Continue to monitor.    Agitation Patient was started on Seroquel.  For a while it looks like patient's agitation had improved.  His restraints were removed.   However overnight on 11/27-28 restraints had to be reapplied.  Will increase the dose of Seroquel.   Alcohol abuse, alcohol withdrawal with delirium tremens, resolving Patient was noted to have significant alcohol withdrawal symptoms.  PCCM consulted and have signed off.  Completed phenobarbital taper.  Vitamin B1 normal Continue thiamine, folic acid and multivitamin   Essential hypertension Noted to be on amlodipine.  Blood pressure is reasonably well-controlled. Was also started on beta-blocker due to poorly controlled blood pressure as well as episodes of SVT.    PSVT Telemetry showed brief episodes of supraventricular tachycardia on 11/23. Echocardiogram shows normal systolic function.   TSH noted to be 5.0 with a normal free T4 of 0.77. Patient was started on beta-blocker.  Telemetry showed improvement.  Subsequently taken off of telemetry.   Hypokalemia/hyponatremia Improved.  Recheck labs tomorrow.  Possible aspiration pneumonia/Possible UTI UA suggestive of UTI as well.  Cultures negative. Was started on Unasyn on 11/17.  Completed 5 days of treatment.   Respiratory status noted to be stable.  He is noted to be afebrile.  Vitamin B12 deficiency B12 177.  Folate 12.7.   Continues to cyanocobalamine.  Macrocytic anemia Secondary to alcohol abuse as well as B12 deficiency. Ferritin noted to be 1074.  Iron 95.  TIBC 252.   Elevated LFTs AST and ALT are noted to be minimally elevated.  Possibly from alcohol  use.  No recent imaging study of the hepatobiliary system noted in EMR.   Since levels are reasonably stable this can be pursued in the outpatient setting. Hepatitis panel is unremarkable.   Tobacco use, history of emphysema Continue nebs as needed Respiratory status is stable.  Severe protein calorie malnutrition Nutrition Problem: Severe Malnutrition Etiology: social / environmental circumstances Signs/Symptoms: severe fat depletion, severe muscle depletion   DVT Prophylaxis: Subcutaneous heparin Code Status: Full code Family Communication: No family at bedside Disposition Plan: SNF once patient is able to safely tolerate p.o. or family has agreed and patient is more appropriate for PEG tube insertion.  Status is: Inpatient Remains inpatient appropriate because: Intracranial hemorrhage, dysphagia     Medications: Scheduled:  amLODipine  10 mg Per Tube Daily   vitamin B-12  1,000 mcg Per Tube Daily   feeding supplement  237 mL Oral Q24H   feeding supplement (JEVITY 1.5 CAL/FIBER)  1,000 mL Per Tube Q24H   folic acid  1 mg Per Tube Daily   free water  100 mL Per Tube Q4H   heparin injection (subcutaneous)  5,000 Units Subcutaneous Q8H   hydrocortisone cream   Topical BID   metoprolol tartrate  50 mg Per Tube BID   multivitamin with minerals  1 tablet Per Tube Daily   mouth rinse  15 mL Mouth Rinse 4 times per day   pantoprazole  40 mg Oral Daily   QUEtiapine  50 mg Per Tube BID   sodium chloride flush  3 mL Intravenous Once   thiamine  100 mg Per Tube Daily   Continuous:   OHY:[id number] **OR** acetaminophen (TYLENOL) oral liquid 160 mg/5 mL **OR** acetaminophen, ipratropium-albuterol, labetalol, loperamide, LORazepam, mouth rinse  Antibiotics: Anti-infectives (From admission, onward)    Start     Dose/Rate Route Frequency Ordered Stop   11/24/21 1530  Ampicillin-Sulbactam (UNASYN) 3 g in sodium chloride 0.9 % 100 mL IVPB        3 g 200 mL/hr over 30 Minutes  Intravenous Every 6 hours 11/24/21 1437 11/28/21 2110       Objective:  Vital Signs  Vitals:   12/05/21 2030 12/05/21 2343 12/06/21 0351 12/06/21 0431  BP: 112/89 119/72 137/79   Pulse: (!) 101 81 95   Resp: 17 16 16    Temp: 99.1 F (37.3 C) 98.1 F (36.7 C) 97.9 F (36.6 C)   TempSrc: Oral Oral Oral   SpO2: 97% 100% 90%   Weight:    53.4 kg  Height:        Intake/Output Summary (Last 24 hours) at 12/06/2021 0758 Last data filed at 12/06/2021 0100 Gross per 24 hour  Intake 2650 ml  Output 500 ml  Net 2150 ml    Filed Weights   12/04/21 0304 12/05/21 0454 12/06/21 0431  Weight: 54.8 kg 54.7 kg 53.4 kg    General appearance: Awake alert.  In no distress.  Mildly agitated but easily redirectable. NG feeding tube is noted Resp: Clear to auscultation bilaterally.  Normal effort Cardio: S1-S2 is normal regular.  No S3-S4.  No rubs murmurs or bruit GI: Abdomen is soft.  Nontender nondistended.  Bowel sounds are present normal.  No masses organomegaly Extremities: No edema.  Right hemiparesis noted.  Lab Results:  Data Reviewed: I have personally reviewed following labs and reports of the imaging studies  CBC: Recent Labs  Lab 11/30/21 0231 12/03/21 0325  WBC 10.8* 10.4  HGB 12.5* 12.1*  HCT 36.3* 33.9*  MCV 105.8* 102.4*  PLT 352 409*     Basic Metabolic Panel: Recent Labs  Lab 11/30/21 0231 12/03/21 0325  NA 139 135  K 3.6 4.3  CL 104 100  CO2 21* 22  GLUCOSE 106* 120*  BUN 14 17  CREATININE 0.71 0.74  CALCIUM 9.2 9.3  MG 2.0 1.8  PHOS 4.8*  --      GFR: Estimated Creatinine Clearance: 72.3 mL/min (by C-G formula based on SCr of 0.74 mg/dL).  Liver Function Tests: Recent Labs  Lab 11/30/21 0231 12/03/21 0325  AST 92* 91*  ALT 70* 97*  ALKPHOS 126 118  BILITOT 0.3 0.3  PROT 7.5 7.1  ALBUMIN 3.1* 2.8*      CBG: Recent Labs  Lab 12/05/21 0812 12/05/21 1610 12/05/21 2027 12/05/21 2344 12/06/21 0353  GLUCAP 148* 129*  131* 118* 135*       No results found for this or any previous visit (from the past 240 hour(s)).     Radiology Studies: No results found.     LOS: 18 days   Azucena Fallen  [facility] Pager on [email or web address]  12/06/2021, 7:58 AM

## 2021-12-06 NOTE — Progress Notes (Signed)
Occupational Therapy Treatment Patient Details Name: Brent Warren MRN: [id number] DOB: Feb 21, 1960 Today's Date: 12/06/2021   History of present illness 61 yo male presenting 11/11 with R-sided weakness, facial droop and slurred speech. 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 PMH includes: HTN, COPD, and alcohol abuse.   OT comments  Pt progressing towards established OT goals. Following ~50% of commands with cues and increased time this session. Pt continues to present with limitations in RUE functional use, balance, activity tolerance, communication, and cognition. Pt performing STS transfers with mod A +2 and taking steps toward [facility] with up to max A. Pt washing face with set-up and hands with up to mod A. Providing PROM to RUE to optimize tone and decrease tightness. Continue to recommend SNF for continued OT services.    Recommendations for follow up therapy are one component of a multi-disciplinary discharge planning process, led by the attending physician.  Recommendations may be updated based on patient status, additional functional criteria and insurance authorization.    Follow Up Recommendations  Skilled nursing-short term rehab (<3 hours/day)     Assistance Recommended at Discharge Frequent or constant Supervision/Assistance  Patient can return home with the following  Two people to help with walking and/or transfers;Two people to help with bathing/dressing/bathroom;Direct supervision/assist for medications management;Direct supervision/assist for financial management;Assistance with feeding;Assistance with cooking/housework;Assist for transportation;Help with stairs or ramp for entrance   Equipment Recommendations  BSC/3in1;Wheelchair (measurements OT);Wheelchair cushion (measurements OT)    Recommendations for Other Services      Precautions / Restrictions Precautions Precautions: Fall Precaution Comments: watch HR Restrictions Weight Bearing Restrictions: No        Mobility Bed Mobility Overal bed mobility: Needs Assistance Bed Mobility: Supine to Sit, Sit to Supine     Supine to sit: Min assist Sit to supine: Min assist   General bed mobility comments: required assistance with BLEs to get to EOB and back to supine    Transfers Overall transfer level: Needs assistance Equipment used: 2 person hand held assist Transfers: Sit to/from Stand Sit to Stand: Mod assist, +2 physical assistance           General transfer comment: Able to come to stand 2x from EOB     Balance Overall balance assessment: Needs assistance Sitting-balance support: Bilateral upper extremity supported, Feet supported, No upper extremity supported Sitting balance-Leahy Scale: Poor Sitting balance - Comments: reliant on UE support Postural control: Posterior lean Standing balance support: Single extremity supported, During functional activity Standing balance-Leahy Scale: Poor Standing balance comment: reliant on therapist and RN                           ADL either performed or assessed with clinical judgement   ADL Overall ADL's : Needs assistance/impaired     Grooming: Wash/dry face;Set up;Bed level;Wash/dry hands Grooming Details (indicate cue type and reason): 1-2 cues to follow commands, then requesting wash cloth to be warmed to wash face a second time. With relevant commends "It's spa day". Mod A to perform hand hygiene to both hands due to decreased mobility of R hand. Pt R hand with odor, bt no overt skin integrity issues             Lower Body Dressing: Sitting/lateral leans;Moderate assistance Lower Body Dressing Details (indicate cue type and reason): Donning socks sitting EOB. Up to min A for sitting balance; requiring OT to thread around toes on R foot, but able  to pull up afterwards. Donning L with support for balance only Toilet Transfer: Moderate assistance;+2 for physical assistance;+2 for safety/equipment Toilet Transfer  Details (indicate cue type and reason): STS and side steps toward HOB. Significant external assist for balance. Some ataxic like movement noted         Functional mobility during ADLs: Moderate assistance;+2 for physical assistance;+2 for safety/equipment General ADL Comments: Mod A +2 for steps toward HOB. Pt limited by activity tolerance, frustration tolerance, communication, balance, and cognition    Extremity/Trunk Assessment Upper Extremity Assessment Upper Extremity Assessment: RUE deficits/detail RUE Deficits / Details: increased flexor tone. Pt with trace movement for grasp. Able to perform PROM elbow extension to ~20 degrees, but unable to fully extend RUE Sensation: decreased light touch RUE Coordination: decreased fine motor;decreased gross motor LUE Deficits / Details: Able to wash face, ROM seems WFL.   Lower Extremity Assessment Lower Extremity Assessment: Defer to PT evaluation        Vision   Vision Assessment?: Vision impaired- to be further tested in functional context Additional Comments: Pt intermittently making eye contact with therapist.   Perception     Praxis      Cognition Arousal/Alertness: Awake/alert Behavior During Therapy: Flat affect, Agitated Overall Cognitive Status: Difficult to assess Area of Impairment: Following commands, Orientation, Attention, Problem solving, Awareness, Safety/judgement                   Current Attention Level: Focused, Sustained (short bouts of sustained attention for donning socks/washing face)   Following Commands: Follows one step commands with increased time Safety/Judgement: Decreased awareness of safety, Decreased awareness of deficits Awareness: Intellectual (unaware bed is soiled or that his balance is poor) Problem Solving: Slow processing General Comments: difficult to understand; intermittently agitated. Does not want to be told what to do but following some commands. Pt with some awareness with  references to current level of function "I need a miracle" when asked for what "to get better". Difficutly with expressive language exacerbates with frustration        Exercises Exercises: General Upper Extremity General Exercises - Upper Extremity Shoulder Flexion: PROM, Right, 5 reps Elbow Flexion: PROM, 10 reps, Right Elbow Extension: PROM, 10 reps, Right Wrist Flexion: PROM, 10 reps, Right Wrist Extension: PROM, 10 reps, Right Digit Composite Flexion: PROM, 10 reps, Right    Shoulder Instructions       General Comments VSS. Pt's RN entering room due to pt tangled with waist restraint and near EOB; OT following to provide A    Pertinent Vitals/ Pain       Pain Assessment Pain Assessment: Faces Faces Pain Scale: No hurt Pain Intervention(s): Monitored during session  Home Living                                          Prior Functioning/Environment              Frequency  Min 2X/week        Progress Toward Goals  OT Goals(current goals can now be found in the care plan section)  Progress towards OT goals: Progressing toward goals  Acute Rehab OT Goals Patient Stated Goal: no restraints OT Goal Formulation: With patient Time For Goal Achievement: 12/18/21 Potential to Achieve Goals: Fair ADL Goals Pt Will Perform Grooming: with min assist;sitting Pt Will Perform Upper Body Bathing: with min assist;sitting Pt Will  Transfer to Toilet: with +2 assist;with min assist;stand pivot transfer;bedside commode Additional ADL Goal #1: pt will complete basic transfer total +2 min (A) as precursor to adls  Plan Discharge plan remains appropriate    Co-evaluation                 AM-PAC OT "6 Clicks" Daily Activity     Outcome Measure   Help from another person eating meals?: Total Help from another person taking care of personal grooming?: A Lot Help from another person toileting, which includes using toliet, bedpan, or urinal?:  Total Help from another person bathing (including washing, rinsing, drying)?: Total Help from another person to put on and taking off regular upper body clothing?: A Lot Help from another person to put on and taking off regular lower body clothing?: A Lot 6 Click Score: 9    End of Session Equipment Utilized During Treatment: Gait belt  OT Visit Diagnosis: Unsteadiness on feet (R26.81);Muscle weakness (generalized) (M62.81);Hemiplegia and hemiparesis;Low vision, both eyes (H54.2);Other symptoms and signs involving cognitive function;Other symptoms and signs involving the nervous system (R29.898) Hemiplegia - Right/Left: Right Hemiplegia - dominant/non-dominant: Dominant Hemiplegia - caused by: Other Nontraumatic intracranial hemorrhage   Activity Tolerance Patient tolerated treatment well   Patient Left in bed;with call bell/phone within reach;with bed alarm set;with restraints reapplied   Nurse Communication Mobility status;Other (comment) (Pt with max requests not to have RUE wrist restraint. Pt able to verbalize knowledge that he should not touch tube in nose, but also difficulty understanding that despite blowing nose, nare will continue to feel diffierent)        Time: 1620-1650 OT Time Calculation (min): 30 min  Charges: OT General Charges $OT Visit: 1 Visit OT Treatments $Self Care/Home Management : 8-22 mins $Therapeutic Exercise: 8-22 mins  Tyler Deis, OTR/L [facility] Acute Rehabilitation Office: [phone]   Myrla Halsted 12/06/2021, 5:50 PM

## 2021-12-07 DIAGNOSIS — I610 Nontraumatic intracerebral hemorrhage in hemisphere, subcortical: Secondary | ICD-10-CM | POA: Diagnosis not present

## 2021-12-07 LAB — GLUCOSE, CAPILLARY
Glucose-Capillary: 126 mg/dL — ABNORMAL HIGH (ref 70–99)
Glucose-Capillary: 145 mg/dL — ABNORMAL HIGH (ref 70–99)
Glucose-Capillary: 107 mg/dL — ABNORMAL HIGH (ref 70–99)
Glucose-Capillary: 142 mg/dL — ABNORMAL HIGH (ref 70–99)
Glucose-Capillary: 119 mg/dL — ABNORMAL HIGH (ref 70–99)

## 2021-12-07 MED ORDER — VITAMIN C 500 MG PO TABS
500.0000 mg | ORAL_TABLET | Freq: Every day | ORAL | Status: DC
  Administered 2021-12-07 – 2021-12-28 (×22): 500 mg via ORAL
  Filled 2021-12-07 (×22): qty 1

## 2021-12-07 MED ORDER — ZINC SULFATE 220 (50 ZN) MG PO CAPS
220.0000 mg | ORAL_CAPSULE | Freq: Every day | ORAL | Status: AC
  Administered 2021-12-07 – 2021-12-20 (×14): 220 mg via ORAL
  Filled 2021-12-07 (×14): qty 1

## 2021-12-07 MED ORDER — VITAMIN B-6 100 MG PO TABS
100.0000 mg | ORAL_TABLET | Freq: Every day | ORAL | Status: AC
  Administered 2021-12-07 – 2021-12-20 (×14): 100 mg via ORAL
  Filled 2021-12-07 (×14): qty 1

## 2021-12-07 MED ORDER — FREE WATER
100.0000 mL | Freq: Three times a day (TID) | Status: DC
  Administered 2021-12-07 – 2021-12-10 (×9): 100 mL

## 2021-12-07 MED ORDER — PROSOURCE TF20 ENFIT COMPATIBL EN LIQD
60.0000 mL | Freq: Every day | ENTERAL | Status: DC
  Administered 2021-12-07 – 2021-12-11 (×5): 60 mL
  Filled 2021-12-07 (×5): qty 60

## 2021-12-07 MED ORDER — VITAMIN A 3 MG (10000 UNIT) PO CAPS
10000.0000 [IU] | ORAL_CAPSULE | Freq: Every day | ORAL | Status: AC
  Administered 2021-12-07 – 2021-12-20 (×14): 10000 [IU] via ORAL
  Filled 2021-12-07 (×14): qty 1

## 2021-12-07 MED ORDER — JEVITY 1.5 CAL/FIBER PO LIQD
237.0000 mL | Freq: Four times a day (QID) | ORAL | Status: DC
  Administered 2021-12-07 – 2021-12-10 (×12): 237 mL
  Filled 2021-12-07 (×15): qty 237

## 2021-12-07 NOTE — Progress Notes (Addendum)
Nutrition Follow-up  DOCUMENTATION CODES:  Severe malnutrition in context of social or environmental circumstances  INTERVENTION:  Continue current diet as ordered per SLP Feeding assistance and automatic trays Ensure Enlive 3x/d 350kcal and 20g protein 10,000IU of vitamin A x 2 weeks, 500 mg Vitamin C x 30 days, 220mg  zinc x 14d, 100mg  B6 x 2 weeks Adjust TF regimen to provide more nutrition needs via cortrak tube. MD would like to try bolus feeding pt to encourage a regulation of hunger cues: 4 bolus feeds of Jevity 1.5 ( each) Prosource TF 1x/d Free water q8h Provides 1500kcal, 80g of protein, and of fluid  NUTRITION DIAGNOSIS:  Severe Malnutrition related to social / environmental circumstances as evidenced by severe fat depletion, severe muscle depletion. - remains applicable  GOAL:  Patient will meet greater than or equal to 90% of their needs - progressing, diet in place, nocturnal TF infusing  MONITOR:  Diet advancement, TF tolerance, Labs  REASON FOR ASSESSMENT:  Consult Calorie Count, Enteral/tube feeding initiation and management  ASSESSMENT:  61 y.o. male with PMH HTN, alcohol abuse, and COPD who presented after ICH.  11/13 SLP rec'd NPO 11/15 Cortrak placed, starting tube feeds 11/21 - MBS, DYS2, thins 11/24 - TF adjusted to nocturnal 11/27-11/29 - kcal count, very poor intake, not meeting needs 11/30 - adjust TF back to meet 100% of needs, cycled x 18 hours  Pt resting in bed at the time of assessment. Awake and alert but difficult to understand speech. Pt with extremely poor PO intake over the last two days during kcal count. Pt has been on nocturnal feeds since 11/24 with no improvement in PO intake and feeds currently only meeting ~50% of needs. Discussed with MD. Trial of bolus feeds are requested to determine if a more structured meal schedule will stimulate pt to begin having hunger cues.   Examined pt and noted follicular  hyperkeratosis on pt's chest and arms, seborrheic dermatitis to the hairline and nasolabial folds, and ecchymosis to the arms. Eyes also noted to have betot's spots. Workup highly suspicious for several micronutrient deficiencies. Ordered replacements for vitamin A, zinc, vitamin c, and vitamin B6. MD stated labs not necessary at this time and preferred to just replace nutrients based on physical findings.   Average Meal Intake: 11/21-11/27: 37% average intake x 3 recorded meals  Nutritionally Relevant Medications: Scheduled Meds:  ascorbic acid  500 mg Oral Daily   vitamin B-12  1,000 mcg Per Tube Daily   Ensure Enlive  237 mL Oral TID BM   feeding supplement (JEVITY 1.5 CAL/FIBER)  1,000 mL Per Tube Q24H   folic acid  1 mg Per Tube Daily   free water  100 mL Per Tube Q8H   multivitamin with minerals  1 tablet Per Tube Daily   pantoprazole  40 mg Oral Daily   pyridOXINE  100 mg Oral Daily   thiamine  100 mg Per Tube Daily   vitamin A  10,000 Units Oral Daily   zinc sulfate  220 mg Oral Daily   PRN Meds: loperamide  Labs Reviewed  NUTRITION - FOCUSED PHYSICAL EXAM: Flowsheet Row Most Recent Value  Orbital Region Severe depletion  Upper Arm Region Severe depletion  Thoracic and Lumbar Region Severe depletion  Buccal Region Moderate depletion  Temple Region Severe depletion  Clavicle Bone Region Severe depletion  Clavicle and Acromion Bone Region Severe depletion  Scapular Bone Region Unable to assess  Dorsal Hand Severe depletion  Patellar Region  Severe depletion  Anterior Thigh Region Severe depletion  Posterior Calf Region Severe depletion  Edema (RD Assessment) None  Hair Reviewed  Eyes Reviewed  [Bitot's Spot]  Mouth Reviewed  [red, smooth]  Skin Reviewed  [seborrheic dermatitis, follicular hyperkeratosis]  Nails Reviewed    Diet Order:   Diet Order             DIET DYS 2 Room service appropriate? No; Fluid consistency: Thin  Diet effective now                    EDUCATION NEEDS:  Not appropriate for education at this time  Skin:  Skin Assessment: Reviewed RN Assessment  Last BM:  11/27 - type 1  Height:  Ht Readings from Last 1 Encounters:  11/22/21 5\' 9"  (1.753 m)    Weight:  Wt Readings from Last 1 Encounters:  12/07/21 53.9 kg    Ideal Body Weight:  72.7 kg  BMI:  Body mass index is 17.55 kg/m.  Estimated Nutritional Needs:  Kcal:  1900-2100 kcal/d Protein:  90-110 g/d Fluid:  1.9-2.1 L/d    12/09/21, RD, LDN Clinical Dietitian RD pager # available in AMION  After hours/weekend pager # available in [facility]

## 2021-12-07 NOTE — Progress Notes (Signed)
Physical Therapy Treatment Patient Details Name: Brent Warren MRN: [id number] DOB: 04-22-1960 Today's Date: 12/07/2021   History of Present Illness 61 yo male presenting 11/11 with R-sided weakness, facial droop and slurred speech. 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 PMH includes: HTN, COPD, and alcohol abuse.    PT Comments    Pt greeted supine in bed and agreeable to session with continued progress. Session focused on sitting balance, progression of functional transfers and gait. Pt able to come to siting EOB with light min assist to elevate trunk and maintain sitting without physical assist. Pt needing mod HHA to come to stand x3 throughout session as pt with tendency for posterior lean. Pt able to progress gait with +1 HHA with seated rest break. Pt needing cues throughout for safety as pt trying to sit prematurely needing cues to complete turn before sitting. Pt continues to benefit from skilled PT services to progress toward functional mobility goals.    Recommendations for follow up therapy are one component of a multi-disciplinary discharge planning process, led by the attending physician.  Recommendations may be updated based on patient status, additional functional criteria and insurance authorization.  Follow Up Recommendations  Acute inpatient rehab (3hours/day)     Assistance Recommended at Discharge Frequent or constant Supervision/Assistance  Patient can return home with the following A lot of help with walking and/or transfers;A lot of help with bathing/dressing/bathroom;Assistance with cooking/housework;Direct supervision/assist for medications management;Direct supervision/assist for financial management;Assist for transportation;Help with stairs or ramp for entrance   Equipment Recommendations  Other (comment)    Recommendations for Other Services       Precautions / Restrictions Precautions Precautions: Fall Precaution Comments: watch HR Restrictions Weight  Bearing Restrictions: No     Mobility  Bed Mobility Overal bed mobility: Needs Assistance Bed Mobility: Supine to Sit, Sit to Supine     Supine to sit: Min assist Sit to supine: Min assist   General bed mobility comments: required assistance with BLEs to get to EOB and back to supine    Transfers Overall transfer level: Needs assistance Equipment used: 1 person hand held assist Transfers: Sit to/from Stand Sit to Stand: Mod assist           General transfer comment: Able to come to stand x3 rom EOB and chair    Ambulation/Gait Ambulation/Gait assistance: Mod assist Gait Distance (Feet): 6 Feet (x2) Assistive device: 1 person hand held assist Gait Pattern/deviations: Step-to pattern, Decreased stride length, Ataxic Gait velocity: decer     General Gait Details: slow short step to gait with assist to steady and maintain balance, pt need frequent cues for upright posture and assist to advance RLE   Stairs             Wheelchair Mobility    Modified Rankin (Stroke Patients Only) Modified Rankin (Stroke Patients Only) Pre-Morbid Rankin Score: No symptoms Modified Rankin: Moderately severe disability     Balance Overall balance assessment: Needs assistance Sitting-balance support: Bilateral upper extremity supported, Feet supported, No upper extremity supported Sitting balance-Leahy Scale: Poor Sitting balance - Comments: reliant on UE support Postural control: Posterior lean Standing balance support: Single extremity supported, During functional activity Standing balance-Leahy Scale: Poor Standing balance comment: reliant on therapist and RN                            Cognition Arousal/Alertness: Awake/alert Behavior During Therapy: Flat affect, Agitated Overall Cognitive Status: Difficult  to assess Area of Impairment: Following commands, Orientation, Attention, Problem solving, Awareness, Safety/judgement                    Current Attention Level: Focused, Sustained (short bouts of sustained attention for donning socks/washing face)   Following Commands: Follows one step commands with increased time Safety/Judgement: Decreased awareness of safety, Decreased awareness of deficits Awareness: Intellectual Problem Solving: Slow processing General Comments: difficult to understand; intermittently agitated. Does not want to be told what to do but following some commands. Pt with some awareness with references to current level of function        Exercises General Exercises - Lower Extremity Long Arc Quad: AROM, Right, Left, 20 reps, Seated Hip Flexion/Marching: AROM, Right, Left, 20 reps, Seated    General Comments        Pertinent Vitals/Pain Pain Assessment Pain Assessment: Faces Faces Pain Scale: No hurt Pain Intervention(s): Monitored during session    Home Living                          Prior Function            PT Goals (current goals can now be found in the care plan section) Acute Rehab PT Goals Patient Stated Goal: return home PT Goal Formulation: With patient Time For Goal Achievement: 12/03/21    Frequency    Min 3X/week      PT Plan Current plan remains appropriate    Co-evaluation              AM-PAC PT "6 Clicks" Mobility   Outcome Measure  Help needed turning from your back to your side while in a flat bed without using bedrails?: A Lot Help needed moving from lying on your back to sitting on the side of a flat bed without using bedrails?: A Lot Help needed moving to and from a bed to a chair (including a wheelchair)?: Total Help needed standing up from a chair using your arms (e.g., wheelchair or bedside chair)?: Total Help needed to walk in hospital room?: Total Help needed climbing 3-5 steps with a railing? : Total 6 Click Score: 8    End of Session Equipment Utilized During Treatment: Gait belt Activity Tolerance: Patient tolerated treatment  well Patient left: in bed;with call bell/phone within reach;with bed alarm set;with restraints reapplied Nurse Communication: Mobility status PT Visit Diagnosis: Other abnormalities of gait and mobility (R26.89);Muscle weakness (generalized) (M62.81);Hemiplegia and hemiparesis Hemiplegia - Right/Left: Right Hemiplegia - dominant/non-dominant: Dominant Hemiplegia - caused by: Nontraumatic intracerebral hemorrhage     Time: 1437-1455 PT Time Calculation (min) (ACUTE ONLY): 18 min  Charges:  $Gait Training: 8-22 mins                     Dystany Duffy R. PTA Acute Rehabilitation Services Office: [phone]    Catalina Antigua 12/07/2021, 3:03 PM

## 2021-12-07 NOTE — Plan of Care (Signed)
  Problem: Education: Goal: Knowledge of disease or condition will improve Outcome: Not Progressing Goal: Knowledge of secondary prevention will improve (MUST DOCUMENT ALL) Outcome: Not Progressing   Problem: Education: Goal: Knowledge of secondary prevention will improve (MUST DOCUMENT ALL) Outcome: Not Progressing   Problem: Intracerebral Hemorrhage Tissue Perfusion: Goal: Complications of Intracerebral Hemorrhage will be minimized Outcome: Progressing

## 2021-12-07 NOTE — Progress Notes (Signed)
[facility] PROGRESS NOTE   Brent Warren [id number] DOB: Mar 02, 1960 DOA: 11/18/2021  PCP: Ladell Pier, MD  Brief History/Interval Summary: 61 year old male with history of HTN, tobacco use, chronic alcohol use presented with sudden onset slurred speech and right-sided weakness.  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  He was admitted to the neuro ICU, remained stable and transferred to the hospitalist service on 11/13.  Patient also developed alcohol withdrawal during this hospitalization.  Consultants: Neurology.  Critical care medicine.   Subjective/Interval History: No acute issues or events noted overnight, continues in restraints, somewhat impulsive, difficult to converse with the patient but he does follow simple commands, unable to answer questions due to rapid unintelligible speech.  Unclear if he understands his current situation or hospital admission.  Assessment/Plan:  Left basal ganglia ICH -Neurology was following and signed off on 11/24/2021 and recommended outpatient follow-up with neurology -Echo showed EF of 60 to 65%.  LDL 62, A1c is 5.1 -PT/OT recommending CIR.  As per CIR, patient is not a good candidate for CIR.   TOC following for SNF placement.  Feeding tube will need to be sorted out before he can be discharged.   Continues to have significant speech impediments.  Oropharyngeal dysphagia Moderate to severe protein caloric malnutrition Initially was kept n.p.o. and started on nasogastric tube feedings.   Seen by speech therapy and diet was advanced.  Currently on dysphagia 2 diet.  Continue to follow aspiration precautions. Patient continues to have quite poor p.o. intake despite decreasing overnight feeds to trickle feed.  Discussed at length with dietitian, plan for bolus feeds in hopes to stimulate patient's GI tract as he continues to state "I am not hungry" despite very few calories over the past 24h. -Continue to  consider PEG tube placement however patient's mental status will have to improve to ensure tube remains intact and safe. -Vitamin supplementation ongoing per discussion with dietary today  Abdominal discomfort, unspecified, on 11/27 -resolved Reason for this was not entirely clear.  He did have multiple bowel movements after which his symptoms subsided.  Bladder scan did show greater than 400 mL but then he was able to void on his own.  Continue to follow  Agitation Patient was started on Seroquel.  For a while it looks like patient's agitation had improved.  His restraints were removed.   However overnight on 11/27-28 restraints had to be reapplied.  Will increase the dose of Seroquel.   Alcohol abuse, alcohol withdrawal with delirium tremens, resolving Patient was noted to have significant alcohol withdrawal symptoms.  PCCM consulted and have signed off.  Completed phenobarbital taper.  Vitamin B1 normal Continue thiamine, folic acid and multivitamin   Essential hypertension Noted to be on amlodipine.  Blood pressure is reasonably well-controlled. Was also started on beta-blocker due to poorly controlled blood pressure as well as episodes of SVT.    PSVT Telemetry showed brief episodes of supraventricular tachycardia on 11/23. Echocardiogram shows normal systolic function.   TSH noted to be 5.0 with a normal free T4 of 0.77. Patient was started on beta-blocker.  Telemetry showed improvement.  Subsequently taken off of telemetry.   Hypokalemia/hyponatremia Improved.  Recheck labs tomorrow.  Possible aspiration pneumonia/Possible UTI UA suggestive of UTI as well.  Cultures negative. Was started on Unasyn on 11/17.  Completed 5 days of treatment.   Respiratory status noted to be stable.  He is noted to be afebrile.  Vitamin B12 deficiency B12 177.  Folate 12.7.   Continues to cyanocobalamine.     Macrocytic anemia Secondary to alcohol abuse as well as B12 deficiency. Ferritin  noted to be 1074.  Iron 95.  TIBC 252.   Elevated LFTs AST and ALT are noted to be minimally elevated.  Possibly from alcohol use.  No recent imaging study of the hepatobiliary system noted in EMR.   Since levels are reasonably stable this can be pursued in the outpatient setting. Hepatitis panel is unremarkable.   Tobacco use, history of emphysema Continue nebs as needed Respiratory status is stable.  Severe protein calorie malnutrition Nutrition Problem: Severe Malnutrition Etiology: social / environmental circumstances Signs/Symptoms: severe fat depletion, severe muscle depletion   DVT Prophylaxis: Subcutaneous heparin Code Status: Full code Family Communication: No family at bedside Disposition Plan: SNF once patient is able to safely tolerate p.o. or family has agreed and patient is more appropriate for PEG tube insertion.  Status is: Inpatient Remains inpatient appropriate because: Intracranial hemorrhage, dysphagia     Medications: Scheduled:  amLODipine  10 mg Per Tube Daily   vitamin B-12  1,000 mcg Per Tube Daily   feeding supplement  237 mL Oral TID BM   feeding supplement (JEVITY 1.5 CAL/FIBER)  1,000 mL Per Tube [id number]   folic acid  1 mg Per Tube Daily   free water  100 mL Per Tube Q4H   heparin injection (subcutaneous)  5,000 Units Subcutaneous Q8H   hydrocortisone cream   Topical BID   metoprolol tartrate  50 mg Per Tube BID   multivitamin with minerals  1 tablet Per Tube Daily   mouth rinse  15 mL Mouth Rinse 4 times per day   pantoprazole  40 mg Oral Daily   QUEtiapine  50 mg Per Tube BID   sodium chloride flush  3 mL Intravenous Once   thiamine  100 mg Per Tube Daily   Continuous:   [id number] **OR** acetaminophen (TYLENOL) oral liquid 160 mg/5 mL **OR** acetaminophen, ipratropium-albuterol, labetalol, loperamide, LORazepam, mouth rinse  Antibiotics: Anti-infectives (From admission, onward)    Start     Dose/Rate Route Frequency Ordered Stop    11/24/21 1530  Ampicillin-Sulbactam (UNASYN) 3 g in sodium chloride 0.9 % 100 mL IVPB        3 g 200 mL/hr over 30 Minutes Intravenous Every 6 hours 11/24/21 1437 11/28/21 2110       Objective:  Vital Signs  Vitals:   12/06/21 1957 12/06/21 2330 12/07/21 0320 12/07/21 0443  BP: 130/87 128/82 129/81   Pulse: 86 78 81   Resp: 16 20 20    Temp: (!) 97.5 F (36.4 C) 98.2 F (36.8 C) 98 F (36.7 C)   TempSrc: Oral     SpO2: 99% 100% 95%   Weight:    53.9 kg  Height:        Intake/Output Summary (Last 24 hours) at 12/07/2021 0732 Last data filed at 12/07/2021 0329 Gross per 24 hour  Intake 480 ml  Output 750 ml  Net -270 ml    Filed Weights   12/05/21 0454 12/06/21 0431 12/07/21 0443  Weight: 54.7 kg 53.4 kg 53.9 kg    General appearance: Awake alert.  In no distress.  Mildly agitated but easily redirectable. NG feeding tube is noted Resp: Clear to auscultation bilaterally.  Normal effort Cardio: S1-S2 is normal regular.  No S3-S4.  No rubs murmurs or bruit GI: Abdomen is soft.  Nontender nondistended.  Bowel sounds are present normal.  No  masses organomegaly Extremities: No edema.  Right hemiparesis noted.    Lab Results:  Data Reviewed: I have personally reviewed following labs and reports of the imaging studies  CBC: Recent Labs  Lab 12/03/21 0325 12/06/21 0733  WBC 10.4 11.1*  HGB 12.1* 12.7*  HCT 33.9* 36.0*  MCV 102.4* 102.6*  PLT 409* 531*     Basic Metabolic Panel: Recent Labs  Lab 12/03/21 0325 12/06/21 0733  NA 135 133*  K 4.3 4.2  CL 100 99  CO2 22 23  GLUCOSE 120* 129*  BUN 17 18  CREATININE 0.74 0.76  CALCIUM 9.3 9.2  MG 1.8 1.9  PHOS  --  4.7*     GFR: Estimated Creatinine Clearance: 73 mL/min (by C-G formula based on SCr of 0.76 mg/dL).  Liver Function Tests: Recent Labs  Lab 12/03/21 0325 12/06/21 0733  AST 91* 71*  ALT 97* 90*  ALKPHOS 118 159*  BILITOT 0.3 0.1*  PROT 7.1 7.4  ALBUMIN 2.8* 3.0*       CBG: Recent Labs  Lab 12/06/21 1542 12/06/21 1722 12/06/21 2000 12/06/21 2324 12/07/21 0318  GLUCAP 121* 108* 116* 120* 126*       No results found for this or any previous visit (from the past 240 hour(s)).     Radiology Studies: No results found.     LOS: 19 days   Azucena Fallen  [facility] Pager on [email or web address]  12/07/2021, 7:32 AM

## 2021-12-07 NOTE — Plan of Care (Signed)
  Problem: Education: Goal: Knowledge of disease or condition will improve Outcome: Not Progressing   Problem: Intracerebral Hemorrhage Tissue Perfusion: Goal: Complications of Intracerebral Hemorrhage will be minimized Outcome: Progressing

## 2021-12-08 DIAGNOSIS — I610 Nontraumatic intracerebral hemorrhage in hemisphere, subcortical: Secondary | ICD-10-CM | POA: Diagnosis not present

## 2021-12-08 DIAGNOSIS — Z419 Encounter for procedure for purposes other than remedying health state, unspecified: Secondary | ICD-10-CM | POA: Diagnosis not present

## 2021-12-08 LAB — GLUCOSE, CAPILLARY
Glucose-Capillary: 118 mg/dL — ABNORMAL HIGH (ref 70–99)
Glucose-Capillary: 124 mg/dL — ABNORMAL HIGH (ref 70–99)
Glucose-Capillary: 108 mg/dL — ABNORMAL HIGH (ref 70–99)
Glucose-Capillary: 116 mg/dL — ABNORMAL HIGH (ref 70–99)
Glucose-Capillary: 117 mg/dL — ABNORMAL HIGH (ref 70–99)

## 2021-12-08 NOTE — Progress Notes (Signed)
Occupational Therapy Treatment Patient Details Name: Brent Warren MRN: [id number] DOB: 11-18-1960 Today's Date: 12/08/2021   History of present illness 61 yo male presenting 11/11 with R-sided weakness, facial droop and slurred speech. 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 PMH includes: HTN, COPD, and alcohol abuse.   OT comments  Pt continues to progress toward established OT goals. Given ativan this morning and intermittently lethargic during session. Pt coming to EOB with min A. Requesting to lie down shortly after, so focusing session on RUE ROM and hand hygiene. Pt following commands with increased time and verbal cues. Pt requiring mod A for hand hygiene, and benefiting from prolionged stretch at elbow joint during ROM of RUE due to increased flexor tone. Pt with good tolerance, and intermittently following commands. Head nodding to preferred music.    Recommendations for follow up therapy are one component of a multi-disciplinary discharge planning process, led by the attending physician.  Recommendations may be updated based on patient status, additional functional criteria and insurance authorization.    Follow Up Recommendations  Acute inpatient rehab (3hours/day)     Assistance Recommended at Discharge Frequent or constant Supervision/Assistance  Patient can return home with the following      Equipment Recommendations  BSC/3in1;Wheelchair (measurements OT);Wheelchair cushion (measurements OT)    Recommendations for Other Services Rehab consult    Precautions / Restrictions Precautions Precautions: Fall Precaution Comments: watch HR Restrictions Weight Bearing Restrictions: No       Mobility Bed Mobility Overal bed mobility: Needs Assistance Bed Mobility: Supine to Sit, Sit to Supine     Supine to sit: Min assist Sit to supine: Min assist   General bed mobility comments: required assistance with BLEs to get to EOB and back to supine. Upon EOB, pt requesting  to lie back down but agreeable to therapist addressing RUE    Transfers                   General transfer comment: Deferred this session per pt request     Balance Overall balance assessment: Needs assistance Sitting-balance support: Bilateral upper extremity supported, Feet supported, No upper extremity supported Sitting balance-Leahy Scale: Poor Sitting balance - Comments: reliant on UE support                                   ADL either performed or assessed with clinical judgement   ADL Overall ADL's : Needs assistance/impaired Eating/Feeding: Moderate assistance;Maximal assistance;Bed level Eating/Feeding Details (indicate cue type and reason): Up to max A due to poor attention and intermittent lethargy throughout session Grooming: Wash/dry hands;Moderate assistance Grooming Details (indicate cue type and reason): Mod A to perform hand hygiene to R hand as pt with poor planning to position hand for cleaning volar aspect                                    Extremity/Trunk Assessment Upper Extremity Assessment Upper Extremity Assessment: RUE deficits/detail RUE Deficits / Details: increased flexor tone. Pt able to actively flex R digits with 2/+5 grasp. Able to perform PROM elbow extension to ~20 degrees, but unable to fully extend RUE Sensation: decreased light touch RUE Coordination: decreased fine motor;decreased gross motor   Lower Extremity Assessment Lower Extremity Assessment: Defer to PT evaluation        Vision  Vision Assessment?: Vision impaired- to be further tested in functional context Additional Comments: Pt cannot follow commands to participate in visual testing but seems to undershoot intermittently   Perception     Praxis      Cognition Arousal/Alertness: Awake/alert Behavior During Therapy: Flat affect, Agitated Overall Cognitive Status: Difficult to assess Area of Impairment: Following commands,  Orientation, Attention, Problem solving, Awareness, Safety/judgement                 Orientation Level:  (can answer yes/no to name and birth date) Current Attention Level: Focused, Sustained (short bouts of sustained attention)   Following Commands: Follows one step commands with increased time Safety/Judgement: Decreased awareness of safety, Decreased awareness of deficits Awareness: Intellectual Problem Solving: Slow processing General Comments: difficult to understand; intermittently agitated. Does not want to be told what to do but following some commands. Pt with some awareness with references to current level of function.        Exercises Exercises: General Upper Extremity General Exercises - Upper Extremity Shoulder Flexion: PROM, Right, 10 reps Shoulder ABduction: PROM, Right, 10 reps Elbow Flexion: PROM, 10 reps, Right Elbow Extension: PROM, 10 reps, Right Wrist Flexion: PROM, 10 reps, Right Wrist Extension: PROM, 10 reps, Right Digit Composite Flexion: PROM, 10 reps, Right Composite Extension: PROM, Right, 10 reps    Shoulder Instructions       General Comments VSS    Pertinent Vitals/ Pain       Pain Assessment Pain Intervention(s): Limited activity within patient's tolerance, Monitored during session  Home Living                                          Prior Functioning/Environment              Frequency  Min 2X/week        Progress Toward Goals  OT Goals(current goals can now be found in the care plan section)  Progress towards OT goals: Progressing toward goals  Acute Rehab OT Goals Patient Stated Goal: stay in bed for session OT Goal Formulation: With patient Time For Goal Achievement: 12/18/21 Potential to Achieve Goals: Fair ADL Goals Pt Will Perform Grooming: with min assist;sitting Pt Will Perform Upper Body Bathing: with min guard assist;sitting Pt Will Transfer to Toilet: with +2 assist;with min  assist;stand pivot transfer;bedside commode Pt/caregiver will Perform Home Exercise Program: Increased ROM;Right Upper extremity;With minimal assist Additional ADL Goal #1: pt will complete basic transfer +2 min (A) as precursor to adls  Plan Discharge plan needs to be updated;Frequency remains appropriate    Co-evaluation                 AM-PAC OT "6 Clicks" Daily Activity     Outcome Measure   Help from another person eating meals?: A Lot Help from another person taking care of personal grooming?: A Lot Help from another person toileting, which includes using toliet, bedpan, or urinal?: Total Help from another person bathing (including washing, rinsing, drying)?: Total Help from another person to put on and taking off regular upper body clothing?: A Lot Help from another person to put on and taking off regular lower body clothing?: A Lot 6 Click Score: 10    End of Session    OT Visit Diagnosis: Unsteadiness on feet (R26.81);Muscle weakness (generalized) (M62.81);Hemiplegia and hemiparesis;Low vision, both eyes (H54.2);Other symptoms and signs involving cognitive  function;Other symptoms and signs involving the nervous system (R29.898) Hemiplegia - Right/Left: Right Hemiplegia - dominant/non-dominant: Dominant Hemiplegia - caused by: Other Nontraumatic intracranial hemorrhage   Activity Tolerance Patient limited by lethargy   Patient Left in bed;with call bell/phone within reach;with bed alarm set   Nurse Communication Mobility status;Other (comment) (decreased skin integrity on R hand)        Time: [id number] OT Time Calculation (min): 26 min  Charges: OT General Charges $OT Visit: 1 Visit OT Treatments $Self Care/Home Management : 8-22 mins $Therapeutic Exercise: 8-22 mins  Elder Cyphers, OTR/L [facility] Acute Rehabilitation Office: [phone]   Magnus Ivan 12/08/2021, 3:45 PM

## 2021-12-08 NOTE — TOC Progression Note (Signed)
Transition of Care [facility]) - Progression Note    Patient Details  Name: Lexx Monte MRN: [id number] Date of Birth: 24-Jun-1960  Transition of Care [facility]) CM/SW Contact  Tory Emerald, [place] Phone Number: 12/08/2021, 10:56 AM  Clinical Narrative:     CSW conversed with pt's brother, [name] regarding PT recommendations and next steps. CSW will continue work up  Expected Discharge Plan: Skilled Nursing Facility Barriers to Discharge: Continued Medical Work up, No SNF bed, SNF Pending transportation  Expected Discharge Plan and Services Expected Discharge Plan: Skilled Nursing Facility     Post Acute Care Choice: Skilled Nursing Facility Living arrangements for the past 2 months: Single Family Home                                       Social Determinants of Health (SDOH) Interventions    Readmission Risk Interventions     No data to display

## 2021-12-08 NOTE — Progress Notes (Signed)
[facility] night cross cover note:   I was contacted by patient's RN requesting clarification regarding the patient's CBG monitoring, which he conveys is currently ordered a sq4 hours, and noted highest blood sugar of 155 over the course of this hospitalization, with average blood sugars over the last few days in the low 100s, without any documented recent hypoglycemia.  RN also conveys that the patient who was previously on continuous tube feeds, is now receiving 4 bolus tube feeds feeds per day and is also starting to take some PO.  I subsequently changed CBG monitoring from every 4 hours to q. ACHS.     Newton Pigg, DO Hospitalist

## 2021-12-08 NOTE — Progress Notes (Signed)
[facility] PROGRESS NOTE   Brent Warren NFA:[id number] DOB: March 22, 1960 DOA: 11/18/2021  PCP: Marcine Matar, MD  Brief History/Interval Summary: 61 year old male with history of HTN, tobacco use, chronic alcohol use presented with sudden onset slurred speech and right-sided weakness.  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  He was admitted to the neuro ICU, remained stable and transferred to the hospitalist service on 11/13.  Patient also developed alcohol withdrawal during this hospitalization.  Consultants: Neurology.  Critical care medicine.   Subjective/Interval History: No acute issues or events noted overnight, continues in restraints, somewhat impulsive, difficult to converse with the patient but he does follow simple commands, he is unable to answer questions coherently due to his rapid speech.  This morning he is somewhat more somnolent and having recently received Ativan for agitation and attempting to get out of bed with inability to redirect.  Assessment/Plan:  Left basal ganglia ICH -Neurology was following and signed off on 11/24/2021 and recommended outpatient follow-up with neurology -Echo showed EF of 60 to 65%.  LDL 62, A1c is 5.1 -PT/OT recommending CIR.  As per CIR, patient is not a good candidate for CIR.   TOC following for SNF placement.  Feeding tube will need to be sorted out before he can be discharged.   Continues to have significant speech impediments.  Oropharyngeal dysphagia Moderate to severe protein caloric malnutrition Initially was kept n.p.o. and started on nasogastric tube feedings.   Seen by speech therapy and diet was advanced.  Currently on dysphagia 2 diet.  Continue to follow aspiration precautions. Patient continues to have quite poor p.o. intake despite decreasing overnight feeds to trickle feed.  Discussed at length with dietitian, plan for bolus feeds in hopes to stimulate patient's GI tract as he continues to  state "I am not hungry" despite very few calories over the past 24h. -Continue to consider PEG tube placement however patient's mental status will have to improve to ensure tube remains intact and safe. -Vitamin supplementation ongoing per discussion with dietary today  Abdominal discomfort, unspecified, on 11/27 -resolved Reason for this was not entirely clear.  He did have multiple bowel movements after which his symptoms subsided.  Bladder scan did show greater than 400 mL but then he was able to void on his own.  Continue to follow  Agitation Patient was started on Seroquel.  For a while it looks like patient's agitation had improved.  His restraints are ongoing as needed, mood generally improved on Seroquel with Ativan as needed but continues to be poorly redirectable attempting to crawl out of bed.  Unclear if he is able to grasp his current situation or is having difficulty remembering he is supposed to stay in bed.   Alcohol abuse, alcohol withdrawal with delirium tremens, resolving Patient was noted to have significant alcohol withdrawal symptoms. PCCM consulted and have signed off.  Completed phenobarbital taper.  Vitamin B1 normal Continue thiamine, folic acid and multivitamin   Essential hypertension Noted to be on amlodipine.  Blood pressure is reasonably well-controlled. Was also started on beta-blocker due to poorly controlled blood pressure as well as episodes of SVT.    PSVT Telemetry showed brief episodes of supraventricular tachycardia on 11/23. Echocardiogram shows normal systolic function.   TSH noted to be 5.0 with a normal free T4 of 0.77. Patient was started on beta-blocker.  Telemetry showed improvement.  Subsequently taken off of telemetry.   Hypokalemia/hyponatremia Improved.  Recheck labs tomorrow.  Possible  aspiration pneumonia/Possible UTI UA suggestive of UTI as well.  Cultures negative. Was started on Unasyn on 11/17.  Completed 5 days of treatment.    Respiratory status noted to be stable.  He is noted to be afebrile.  Vitamin B12 deficiency B12 177.  Folate 12.7.   Continues to cyanocobalamine.     Macrocytic anemia Secondary to alcohol abuse as well as B12 deficiency. Ferritin noted to be 1074.  Iron 95.  TIBC 252.   Elevated LFTs AST and ALT are noted to be minimally elevated.  Possibly from alcohol use.  No recent imaging study of the hepatobiliary system noted in EMR.   Since levels are reasonably stable this can be pursued in the outpatient setting. Hepatitis panel is unremarkable.   Tobacco use, history of emphysema Continue nebs as needed Respiratory status is stable.  Severe protein calorie malnutrition Nutrition Problem: Severe Malnutrition Etiology: social / environmental circumstances Signs/Symptoms: severe fat depletion, severe muscle depletion   DVT Prophylaxis: Subcutaneous heparin Code Status: Full code Family Communication: No family at bedside Disposition Plan: SNF once patient is able to safely tolerate p.o. or family has agreed and patient is more appropriate for PEG tube insertion.  Status is: Inpatient Remains inpatient appropriate because: Intracranial hemorrhage, dysphagia     Medications: Scheduled:  amLODipine  10 mg Per Tube Daily   ascorbic acid  500 mg Oral Daily   vitamin B-12  1,000 mcg Per Tube Daily   feeding supplement  237 mL Oral TID BM   feeding supplement (JEVITY 1.5 CAL/FIBER)  237 mL Per Tube QID   feeding supplement (PROSource TF20)  60 mL Per Tube Daily   folic acid  1 mg Per Tube Daily   free water  100 mL Per Tube Q8H   heparin injection (subcutaneous)  5,000 Units Subcutaneous Q8H   hydrocortisone cream   Topical BID   metoprolol tartrate  50 mg Per Tube BID   multivitamin with minerals  1 tablet Per Tube Daily   mouth rinse  15 mL Mouth Rinse 4 times per day   pantoprazole  40 mg Oral Daily   pyridOXINE  100 mg Oral Daily   QUEtiapine  50 mg Per Tube BID   sodium  chloride flush  3 mL Intravenous Once   thiamine  100 mg Per Tube Daily   vitamin A  10,000 Units Oral Daily   zinc sulfate  220 mg Oral Daily   Continuous:   IZX:[id number] **OR** acetaminophen (TYLENOL) oral liquid 160 mg/5 mL **OR** acetaminophen, ipratropium-albuterol, labetalol, loperamide, LORazepam, mouth rinse  Antibiotics: Anti-infectives (From admission, onward)    Start     Dose/Rate Route Frequency Ordered Stop   11/24/21 1530  Ampicillin-Sulbactam (UNASYN) 3 g in sodium chloride 0.9 % 100 mL IVPB        3 g 200 mL/hr over 30 Minutes Intravenous Every 6 hours 11/24/21 1437 11/28/21 2110       Objective:  Vital Signs  Vitals:   12/07/21 2030 12/07/21 2353 12/08/21 0327 12/08/21 0500  BP: (!) 146/96 139/83 (!) 144/90   Pulse: 89 88 100   Resp: 20 18 16    Temp: 99.1 F (37.3 C) 98.3 F (36.8 C) 98.6 F (37 C)   TempSrc: Oral     SpO2: 100% 98% (!) 86%   Weight:    53 kg  Height:        Intake/Output Summary (Last 24 hours) at 12/08/2021 0757 Last data filed at 12/07/2021 2037 Gross  per 24 hour  Intake 1057 ml  Output --  Net 1057 ml    Filed Weights   12/06/21 0431 12/07/21 0443 12/08/21 0500  Weight: 53.4 kg 53.9 kg 53 kg    General appearance: Awake alert.  In no distress.  Mildly agitated but easily redirectable. NG feeding tube is noted Resp: Clear to auscultation bilaterally.  Normal effort Cardio: S1-S2 is normal regular.  No S3-S4.  No rubs murmurs or bruit GI: Abdomen is soft.  Nontender nondistended.  Bowel sounds are present normal.  No masses organomegaly Extremities: No edema.  Right hemiparesis noted.    Lab Results:  Data Reviewed: I have personally reviewed following labs and reports of the imaging studies  CBC: Recent Labs  Lab 12/03/21 0325 12/06/21 0733  WBC 10.4 11.1*  HGB 12.1* 12.7*  HCT 33.9* 36.0*  MCV 102.4* 102.6*  PLT 409* 531*     Basic Metabolic Panel: Recent Labs  Lab 12/03/21 0325  12/06/21 0733  NA 135 133*  K 4.3 4.2  CL 100 99  CO2 22 23  GLUCOSE 120* 129*  BUN 17 18  CREATININE 0.74 0.76  CALCIUM 9.3 9.2  MG 1.8 1.9  PHOS  --  4.7*     GFR: Estimated Creatinine Clearance: 71.8 mL/min (by C-G formula based on SCr of 0.76 mg/dL).  Liver Function Tests: Recent Labs  Lab 12/03/21 0325 12/06/21 0733  AST 91* 71*  ALT 97* 90*  ALKPHOS 118 159*  BILITOT 0.3 0.1*  PROT 7.1 7.4  ALBUMIN 2.8* 3.0*      CBG: Recent Labs  Lab 12/07/21 1201 12/07/21 1649 12/07/21 2032 12/07/21 2345 12/08/21 0326  GLUCAP 107* 142* 119* 124* 118*       No results found for this or any previous visit (from the past 240 hour(s)).     Radiology Studies: No results found.     LOS: 20 days   Azucena Fallen  [facility] Pager on [email or web address]  12/08/2021, 7:57 AM

## 2021-12-08 NOTE — Progress Notes (Signed)
Mobility Specialist Progress Note    12/08/21 1557  Mobility  Activity Dangled on edge of bed;Stood at bedside  Level of Assistance Moderate assist, patient does 50-74%  Assistive Device Front wheel walker  Activity Response Tolerated well  $Mobility charge 1 Mobility   Pt received and agreeable. Tolerated sitting EOB ~10 minutes. Completed x3 STS. Returned to supine with call bell in reach. RN aware.   [place] Mobility Specialist  Please Neurosurgeon or Rehab Office at [phone]

## 2021-12-08 NOTE — Progress Notes (Signed)
Speech Language Pathology Treatment: Cognitive-Linquistic  Patient Details Name: Brent Warren MRN: [id number] DOB: 08/12/1960 Today's Date: 12/08/2021 Time: 3500-9381 SLP Time Calculation (min) (ACUTE ONLY): 10 min  Assessment / Plan / Recommendation Clinical Impression  Session primarily focused on speech as pt refused most POs offered, with the exception of 3 bites of his magic cup, which he consumed without any overt difficulty. Speech strategies were reinforced and pt was then given opportunities to practice at the word level, primarily focusing on yes/no responses. Despite asking yes/no questions, pt often provided more verbose responses, making his speech generally unintelligible without context. When he did respond appropriately, he gave very clear "no" or "yes." Will continue to follow.    HPI HPI: 61 year old male with history of HTN, tobacco use, EtOH use who comes into the hospital with sudden onset slurred speech and right-sided weakness.  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   He started to develop increased withdrawal symptoms on 11/13      SLP Plan  Continue with current plan of care      Recommendations for follow up therapy are one component of a multi-disciplinary discharge planning process, led by the attending physician.  Recommendations may be updated based on patient status, additional functional criteria and insurance authorization.    Recommendations  Diet recommendations: Dysphagia 2 (fine chop);Thin liquid Liquids provided via: Cup;Straw Medication Administration: Whole meds with puree Supervision: Full supervision/cueing for compensatory strategies;Staff to assist with self feeding Compensations: Small sips/bites;Slow rate;Minimize environmental distractions Postural Changes and/or Swallow Maneuvers: Seated upright 90 degrees                Oral Care Recommendations: Oral care BID Follow Up Recommendations: Acute inpatient rehab  (3hours/day) Assistance recommended at discharge: Frequent or constant Supervision/Assistance SLP Visit Diagnosis: Dysphagia, unspecified (R13.10) Plan: Continue with current plan of care           Mahala Menghini., M.A. CCC-SLP Acute Rehabilitation Services Office [phone]  Secure chat preferred   12/08/2021, 3:26 PM

## 2021-12-08 NOTE — NC FL2 (Signed)
[place] MEDICAID FL2 LEVEL OF CARE FORM     IDENTIFICATION  Patient Name: Brent Warren Birthdate: 04-23-1960 Sex: male Admission Date (Current Location): 11/18/2021  [facility] and [place] Number:  Herbalist and Address:  [facility], [place] [address] [place], [place] [address]      Provider Number: [id number]  Attending Physician Name and Address:  Little Ishikawa, MD  Relative Name and Phone Number:  Letitia Libra Mcgillivray [phone]    Current Level of Care: Hospital Recommended Level of Care: [place] Prior Approval Number:    Date Approved/Denied:   PASRR Number: [id number]  Discharge Plan: SNF    Current Diagnoses: Patient Active Problem List   Diagnosis Date Noted   Protein-calorie malnutrition, severe 11/22/2021   Alcohol withdrawal delirium ([place]) 11/20/2021   Elevated MCV 11/20/2021   ICH (intracerebral hemorrhage) ([place]) 11/18/2021   COVID-19 vaccine series completed 10/26/2019   Polyarthritis 02/05/2019   Chest pain in adult 04/02/2017   Immunization due 11/06/2016   Pulmonary emphysema ([place]) 11/06/2016   Urge incontinence 11/06/2016   Tobacco abuse 11/06/2016   ETOH abuse 11/06/2016   Lung nodule, multiple 11/08/2015   HTN (hypertension) 08/01/2015   Hyponatremia 05/22/2015   Hepatitis 05/22/2015    Orientation RESPIRATION BLADDER Height & Weight     Self  Normal External catheter, Incontinent Weight: 116 lb 13.5 oz (53 kg) Height:  5\' 9"  (175.3 cm)  BEHAVIORAL SYMPTOMS/MOOD NEUROLOGICAL BOWEL NUTRITION STATUS      Incontinent Diet (see d/c summary)  AMBULATORY STATUS COMMUNICATION OF NEEDS Skin   Extensive Assist Verbally (speech is impaired) Normal                       Personal Care Assistance Level of Assistance              Functional Limitations Info  Speech, Hearing, Sight Sight Info: Adequate Hearing Info: Adequate Speech Info: Impaired    SPECIAL CARE FACTORS FREQUENCY  PT  (By licensed PT), OT (By licensed OT)     PT Frequency: 5x/week OT Frequency: 5x/week            Contractures Contractures Info: Not present    Additional Factors Info  Code Status Code Status Info: full             Current Medications (12/08/2021):  This is the current hospital active medication list Current Facility-Administered Medications  Medication Dose Route Frequency Provider Last Rate Last Admin   acetaminophen (TYLENOL) tablet 650 mg  650 mg Oral Q4H PRN Donnetta Simpers, MD       Or   acetaminophen (TYLENOL) 160 MG/5ML solution 650 mg  650 mg Per Tube Q4H PRN Donnetta Simpers, MD   650 mg at 11/22/21 2027   Or   acetaminophen (TYLENOL) suppository 650 mg  650 mg Rectal Q4H PRN Donnetta Simpers, MD       amLODipine (NORVASC) tablet 10 mg  10 mg Per Tube Daily Rosalin Hawking, MD   10 mg at 12/08/21 [id number]   ascorbic acid (VITAMIN C) tablet 500 mg  500 mg Oral Daily Little Ishikawa, MD   500 mg at 12/08/21 [id number]   cyanocobalamin (VITAMIN B12) tablet 1,000 mcg  1,000 mcg Per Tube Daily Aline August, MD   1,000 mcg at 12/08/21 0921   feeding supplement (ENSURE ENLIVE / ENSURE PLUS) liquid 237 mL  237 mL Oral TID BM Little Ishikawa, MD   [phone]  mL at 12/08/21 2536   feeding supplement (JEVITY 1.5 CAL/FIBER) liquid 237 mL  237 mL Per Tube QID Azucena Fallen, MD   237 mL at 12/08/21 6440   feeding supplement (PROSource TF20) liquid 60 mL  60 mL Per Tube Daily Azucena Fallen, MD   60 mL at 12/08/21 3474   folic acid (FOLVITE) tablet 1 mg  1 mg Per Tube Daily Marlin Canary U, DO   1 mg at 12/08/21 2595   free water 100 mL  100 mL Per Tube Q8H Azucena Fallen, MD   100 mL at 12/08/21 0623   heparin injection 5,000 Units  5,000 Units Subcutaneous Buena Irish, MD   5,000 Units at 12/08/21 6387   hydrocortisone cream 1 %   Topical BID Mansy, Jan A, MD   Given at 12/08/21 5643   ipratropium-albuterol (DUONEB) 0.5-2.5 (3) MG/3ML nebulizer solution 3 mL   3 mL Nebulization Q4H PRN Leatha Gilding, MD       labetalol (NORMODYNE) injection 5 mg  5 mg Intravenous Q2H PRN Marlin Canary U, DO   5 mg at 11/26/21 2124   loperamide (IMODIUM) capsule 4 mg  4 mg Oral TID PRN Osvaldo Shipper, MD       LORazepam (ATIVAN) injection 1 mg  1 mg Intravenous Q4H PRN Duayne Cal, NP   1 mg at 12/08/21 3295   metoprolol tartrate (LOPRESSOR) tablet 50 mg  50 mg Per Tube BID Osvaldo Shipper, MD   50 mg at 12/08/21 1884   multivitamin with minerals tablet 1 tablet  1 tablet Per Tube Daily Marlin Canary U, DO   1 tablet at 12/08/21 1660   Oral care mouth rinse  15 mL Mouth Rinse 4 times per day Brooke Dare L, MD   15 mL at 12/08/21 0831   Oral care mouth rinse  15 mL Mouth Rinse PRN Bhagat, Srishti L, MD   15 mL at 11/27/21 0334   pantoprazole (PROTONIX) EC tablet 40 mg  40 mg Oral Daily Cozart, Hannah, RPH   40 mg at 12/07/21 2035   pyridOXINE (VITAMIN B6) tablet 100 mg  100 mg Oral Daily Azucena Fallen, MD   100 mg at 12/08/21 6301   QUEtiapine (SEROQUEL) tablet 50 mg  50 mg Per Tube BID Osvaldo Shipper, MD   50 mg at 12/08/21 6010   sodium chloride flush (NS) 0.9 % injection 3 mL  3 mL Intravenous Once Gloris Manchester, MD       thiamine (VITAMIN B1) tablet 100 mg  100 mg Per Tube Daily Marlin Canary U, DO   100 mg at 12/08/21 9323   vitamin A capsule 10,000 Units  10,000 Units Oral Daily Azucena Fallen, MD   10,000 Units at 12/08/21 5573   zinc sulfate capsule 220 mg  220 mg Oral Daily Azucena Fallen, MD   220 mg at 12/08/21 2202     Discharge Medications: Please see discharge summary for a list of discharge medications.  Relevant Imaging Results:  Relevant Lab Results:   Additional Information Tory Emerald, [place]; SS#: [id number]  Tory Emerald, LCSW

## 2021-12-09 DIAGNOSIS — I610 Nontraumatic intracerebral hemorrhage in hemisphere, subcortical: Secondary | ICD-10-CM | POA: Diagnosis not present

## 2021-12-09 LAB — GLUCOSE, CAPILLARY
Glucose-Capillary: 106 mg/dL — ABNORMAL HIGH (ref 70–99)
Glucose-Capillary: 112 mg/dL — ABNORMAL HIGH (ref 70–99)
Glucose-Capillary: 121 mg/dL — ABNORMAL HIGH (ref 70–99)
Glucose-Capillary: 129 mg/dL — ABNORMAL HIGH (ref 70–99)
Glucose-Capillary: 149 mg/dL — ABNORMAL HIGH (ref 70–99)
Glucose-Capillary: 154 mg/dL — ABNORMAL HIGH (ref 70–99)

## 2021-12-09 NOTE — Progress Notes (Signed)
[facility] PROGRESS NOTE   Brent Warren WUJ:[id number] DOB: 12/26/1960 DOA: 11/18/2021  PCP: Marcine Matar, MD  Brief History/Interval Summary: 61 year old male with history of HTN, tobacco use, chronic alcohol use presented with sudden onset slurred speech and right-sided weakness.  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  He was admitted to the neuro ICU, remained stable and transferred to the hospitalist service on 11/13.  Patient also developed alcohol withdrawal during this hospitalization.  Consultants: Neurology.  Critical care medicine.   Subjective/Interval History: No acute issues or events noted overnight, patient much more coherent this morning - speech more intelligible but patient continues to have episodes of rapid speech which are difficult to understand but following commands today and seems much more appropriate.  Assessment/Plan:  Left basal ganglia ICH -Neurology was following and signed off on 11/24/2021 and recommended outpatient follow-up with neurology -Echo showed EF of 60 to 65%.  LDL 62, A1c is 5.1 -PT/OT recommending CIR.  As per CIR, patient is not a good candidate for CIR.   TOC following for SNF placement.  Feeding tube will need to be sorted out before he can be discharged.   -Speech somewhat improving as is his mental status, discontinue restraints follow clinically, still needs frequent redirection, if necessary will continue restraints for Ativan if indicated.  Hopefully can wean off of these in the next few days .  Oropharyngeal dysphagia Moderate to severe protein caloric malnutrition Initially was kept n.p.o. and started on nasogastric tube feedings.   Seen by speech therapy and diet was advanced.  Currently on dysphagia 2 diet.  Continue to follow aspiration precautions. Transition to bolus feeding to help mimic meals -hopefully can wean down bolus feedings over the next few days and increase p.o. intake -Continue to  consider PEG tube placement however patient's mental status will have to improve to ensure tube remains intact and safe. -Vitamin supplementation ongoing per discussion with dietary today  Abdominal discomfort, unspecified, on 11/27 -resolved Reason for this was not entirely clear.  He did have multiple bowel movements after which his symptoms subsided.  Bladder scan did show greater than 400 mL but then he was able to void on his own.  Continue to follow  Agitation, resolving Patient was started on Seroquel as well as Ativan as needed for agitation Patient more appropriate today, still somewhat impulsive although speech is improving We will attempt to discontinue restraints and follow clinically, he still needs frequent redirection but is improving   Alcohol abuse, alcohol withdrawal with delirium tremens, resolving Patient was noted to have significant alcohol withdrawal symptoms. PCCM consulted and have signed off.  Completed phenobarbital taper.  Vitamin B1 normal Continue thiamine, folic acid and multivitamin   Essential hypertension Noted to be on amlodipine.  Blood pressure is reasonably well-controlled. Was also started on beta-blocker due to poorly controlled blood pressure as well as episodes of SVT.    PSVT Telemetry showed brief episodes of supraventricular tachycardia on 11/23. Echocardiogram shows normal systolic function.   TSH noted to be 5.0 with a normal free T4 of 0.77. Patient was started on beta-blocker.  Telemetry showed improvement.  Subsequently taken off of telemetry.   Hypokalemia/hyponatremia Improved, follow as indicated  Possible aspiration pneumonia/Possible UTI UA suggestive of UTI as well.  Cultures negative. Was started on Unasyn on 11/17.  Completed 5 days of treatment.   Respiratory status noted to be stable.  He is noted to be afebrile.  Vitamin B12  deficiency B12 177.  Folate 12.7.   Continues to cyanocobalamine.     Macrocytic  anemia Secondary to alcohol abuse as well as B12 deficiency. Ferritin noted to be 1074.  Iron 95.  TIBC 252.   Elevated LFTs AST and ALT are noted to be minimally elevated.  Possibly from alcohol use.  No recent imaging study of the hepatobiliary system noted in EMR.   Since levels are reasonably stable this can be pursued in the outpatient setting. Hepatitis panel is unremarkable.   Tobacco use, history of emphysema Continue nebs as needed Respiratory status is stable.  Severe protein calorie malnutrition Nutrition Problem: Severe Malnutrition Etiology: social / environmental circumstances Signs/Symptoms: severe fat depletion, severe muscle depletion   DVT Prophylaxis: Subcutaneous heparin Code Status: Full code Family Communication: No family at bedside, attempted to call brother, no answer Disposition Plan: SNF once patient is able to safely tolerate p.o. or family has agreed and patient is more appropriate for PEG tube insertion.  Status is: Inpatient Remains inpatient appropriate because: Intracranial hemorrhage, dysphagia  Medications: Scheduled:  amLODipine  10 mg Per Tube Daily   ascorbic acid  500 mg Oral Daily   vitamin B-12  1,000 mcg Per Tube Daily   feeding supplement  237 mL Oral TID BM   feeding supplement (JEVITY 1.5 CAL/FIBER)  237 mL Per Tube QID   feeding supplement (PROSource TF20)  60 mL Per Tube Daily   folic acid  1 mg Per Tube Daily   free water  100 mL Per Tube Q8H   heparin injection (subcutaneous)  5,000 Units Subcutaneous Q8H   hydrocortisone cream   Topical BID   metoprolol tartrate  50 mg Per Tube BID   multivitamin with minerals  1 tablet Per Tube Daily   mouth rinse  15 mL Mouth Rinse 4 times per day   pantoprazole  40 mg Oral Daily   pyridOXINE  100 mg Oral Daily   QUEtiapine  50 mg Per Tube BID   sodium chloride flush  3 mL Intravenous Once   thiamine  100 mg Per Tube Daily   vitamin A  10,000 Units Oral Daily   zinc sulfate  220 mg  Oral Daily   Continuous:   [id number] **OR** acetaminophen (TYLENOL) oral liquid 160 mg/5 mL **OR** acetaminophen, ipratropium-albuterol, labetalol, loperamide, LORazepam, mouth rinse  Antibiotics: Anti-infectives (From admission, onward)    Start     Dose/Rate Route Frequency Ordered Stop   11/24/21 1530  Ampicillin-Sulbactam (UNASYN) 3 g in sodium chloride 0.9 % 100 mL IVPB        3 g 200 mL/hr over 30 Minutes Intravenous Every 6 hours 11/24/21 1437 11/28/21 2110       Objective:  Vital Signs  Vitals:   12/08/21 2050 12/09/21 0003 12/09/21 0441 12/09/21 0500  BP: (!) 132/91 134/79 (!) 121/93   Pulse: (!) 104 81 93   Resp: 18 16 18    Temp: 98 F (36.7 C) 98.2 F (36.8 C) (!) 97.3 F (36.3 C)   TempSrc: Oral  Oral   SpO2: 100% 99% 99%   Weight:    54.6 kg  Height:        Intake/Output Summary (Last 24 hours) at 12/09/2021 0732 Last data filed at 12/09/2021 0641 Gross per 24 hour  Intake --  Output 1000 ml  Net -1000 ml    Filed Weights   12/07/21 0443 12/08/21 0500 12/09/21 0500  Weight: 53.9 kg 53 kg 54.6 kg  General appearance: Awake alert.  In no distress.  More appropriate today, impulsive but able to follow commands and speech less rapid and more intelligible. NG feeding tube is noted Resp: Clear to auscultation bilaterally.  Normal effort Cardio: S1-S2 is normal regular.  No S3-S4.  No rubs murmurs or bruit GI: Abdomen is soft.  Nontender nondistended.  Bowel sounds are present normal.  No masses organomegaly Extremities: No edema.  Right hemiparesis noted.  Lab Results:  Data Reviewed: I have personally reviewed following labs and reports of the imaging studies  CBC: Recent Labs  Lab 12/03/21 0325 12/06/21 0733  WBC 10.4 11.1*  HGB 12.1* 12.7*  HCT 33.9* 36.0*  MCV 102.4* 102.6*  PLT 409* 531*     Basic Metabolic Panel: Recent Labs  Lab 12/03/21 0325 12/06/21 0733  NA 135 133*  K 4.3 4.2  CL 100 99  CO2 22 23  GLUCOSE  120* 129*  BUN 17 18  CREATININE 0.74 0.76  CALCIUM 9.3 9.2  MG 1.8 1.9  PHOS  --  4.7*     GFR: Estimated Creatinine Clearance: 73.9 mL/min (by C-G formula based on SCr of 0.76 mg/dL).  Liver Function Tests: Recent Labs  Lab 12/03/21 0325 12/06/21 0733  AST 91* 71*  ALT 97* 90*  ALKPHOS 118 159*  BILITOT 0.3 0.1*  PROT 7.1 7.4  ALBUMIN 2.8* 3.0*      CBG: Recent Labs  Lab 12/08/21 1148 12/08/21 1544 12/09/21 0006 12/09/21 0442 12/09/21 0634  GLUCAP 116* 117* 121* 149* 112*       No results found for this or any previous visit (from the past 240 hour(s)).     Radiology Studies: No results found.     LOS: 21 days   Little Ishikawa  [facility] Pager on [email or web address]  12/09/2021, 7:32 AM

## 2021-12-10 DIAGNOSIS — I610 Nontraumatic intracerebral hemorrhage in hemisphere, subcortical: Secondary | ICD-10-CM | POA: Diagnosis not present

## 2021-12-10 LAB — GLUCOSE, CAPILLARY
Glucose-Capillary: 113 mg/dL — ABNORMAL HIGH (ref 70–99)
Glucose-Capillary: 123 mg/dL — ABNORMAL HIGH (ref 70–99)
Glucose-Capillary: 130 mg/dL — ABNORMAL HIGH (ref 70–99)

## 2021-12-10 NOTE — Progress Notes (Signed)
[facility] PROGRESS NOTE   Brent Warren [id number]:[id number] DOB: Aug 23, 1960 DOA: 11/18/2021  PCP: Marcine Matar, MD  Brief History/Interval Summary: 61 year old male with history of HTN, tobacco use, chronic alcohol use presented with sudden onset slurred speech and right-sided weakness.  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  He was admitted to the neuro ICU, remained stable and transferred to the hospitalist service on 11/13.  Patient also developed alcohol withdrawal during this hospitalization.  Consultants: Neurology.  Critical care medicine.   Subjective/Interval History: No acute issues or events noted overnight, patient much more coherent this morning - speech more intelligible but patient continues to have episodes of rapid speech which are difficult to understand but following commands today and seems much more appropriate. Making jokes about diet requesting beer with lunch.  Assessment/Plan:  Left basal ganglia ICH -Neurology was following and signed off on 11/24/2021 and recommended outpatient follow-up with neurology -Echo showed EF of 60 to 65%.  LDL 62, A1c is 5.1 -PT/OT recommending CIR.  As per CIR, patient is not a good candidate for CIR.   TOC following for SNF placement.  Feeding tube will need to be sorted out before he can be discharged.   -Speech somewhat improving as is his mental status, discontinue restraints follow clinically, still needs frequent redirection, if necessary will continue restraints for Ativan if indicated.  Hopefully can wean off of these in the next few days .  Oropharyngeal dysphagia Moderate to severe protein caloric malnutrition Initially was kept n.p.o. and started on nasogastric tube feedings.   Seen by speech therapy and diet was advanced.  Currently on dysphagia 2 diet.  Continue to follow aspiration precautions. Given patient's marked improvement will DC NG tube and advance diet as tolerated in hopes removal  of the tube will improve PO intake -Can consider replacement NG/PEG later this week if patient unable to increase caloric intake appropriately  Abdominal discomfort, unspecified, on 11/27 -resolved Reason for this was not entirely clear.  He did have multiple bowel movements after which his symptoms subsided.  Bladder scan did show greater than 400 mL but then he was able to void on his own.  Continue to follow  Agitation, resolving Patient was started on Seroquel as well as Ativan as needed for agitation Patient more appropriate today, still somewhat impulsive although speech is improving We will attempt to discontinue restraints and follow clinically, he still needs frequent redirection but is improving   Alcohol abuse, alcohol withdrawal with delirium tremens, resolving Patient was noted to have significant alcohol withdrawal symptoms. PCCM consulted and have signed off.  Completed phenobarbital taper.  Vitamin B1 normal Continue thiamine, folic acid and multivitamin   Essential hypertension Noted to be on amlodipine.  Blood pressure is reasonably well-controlled. Was also started on beta-blocker due to poorly controlled blood pressure as well as episodes of SVT.    PSVT Telemetry showed brief episodes of supraventricular tachycardia on 11/23. Echocardiogram shows normal systolic function.   TSH noted to be 5.0 with a normal free T4 of 0.77. Patient was started on beta-blocker.  Telemetry showed improvement.  Subsequently taken off of telemetry.   Hypokalemia/hyponatremia Improved, follow as indicated  Possible aspiration pneumonia/Possible UTI UA suggestive of UTI as well.  Cultures negative. Was started on Unasyn on 11/17.  Completed 5 days of treatment.   Respiratory status noted to be stable.  He is noted to be afebrile.  Vitamin B12 deficiency B12 177.  Folate 12.7.  Continues to cyanocobalamine.     Macrocytic anemia Secondary to alcohol abuse as well as B12  deficiency. Ferritin noted to be 1074.  Iron 95.  TIBC 252.   Elevated LFTs AST and ALT are noted to be minimally elevated.  Possibly from alcohol use.  No recent imaging study of the hepatobiliary system noted in EMR.   Since levels are reasonably stable this can be pursued in the outpatient setting. Hepatitis panel is unremarkable.   Tobacco use, history of emphysema Continue nebs as needed Respiratory status is stable.  Severe protein calorie malnutrition Nutrition Problem: Severe Malnutrition Etiology: social / environmental circumstances Signs/Symptoms: severe fat depletion, severe muscle depletion   DVT Prophylaxis: Subcutaneous heparin Code Status: Full code Family Communication: No family at bedside, attempted to call brother, no answer Disposition Plan: SNF once patient is able to safely tolerate p.o. or family has agreed and patient is more appropriate for PEG tube insertion.  Status is: Inpatient Remains inpatient appropriate because: Intracranial hemorrhage, dysphagia  Medications: Scheduled:  amLODipine  10 mg Per Tube Daily   ascorbic acid  500 mg Oral Daily   vitamin B-12  1,000 mcg Per Tube Daily   feeding supplement  237 mL Oral TID BM   feeding supplement (JEVITY 1.5 CAL/FIBER)  237 mL Per Tube QID   feeding supplement (PROSource TF20)  60 mL Per Tube Daily   folic acid  1 mg Per Tube Daily   free water  100 mL Per Tube Q8H   heparin injection (subcutaneous)  5,000 Units Subcutaneous Q8H   hydrocortisone cream   Topical BID   metoprolol tartrate  50 mg Per Tube BID   multivitamin with minerals  1 tablet Per Tube Daily   mouth rinse  15 mL Mouth Rinse 4 times per day   pantoprazole  40 mg Oral Daily   pyridOXINE  100 mg Oral Daily   QUEtiapine  50 mg Per Tube BID   sodium chloride flush  3 mL Intravenous Once   thiamine  100 mg Per Tube Daily   vitamin A  10,000 Units Oral Daily   zinc sulfate  220 mg Oral Daily   Continuous:   [id number]  **OR** acetaminophen (TYLENOL) oral liquid 160 mg/5 mL **OR** acetaminophen, ipratropium-albuterol, labetalol, loperamide, LORazepam, mouth rinse  Antibiotics: Anti-infectives (From admission, onward)    Start     Dose/Rate Route Frequency Ordered Stop   11/24/21 1530  Ampicillin-Sulbactam (UNASYN) 3 g in sodium chloride 0.9 % 100 mL IVPB        3 g 200 mL/hr over 30 Minutes Intravenous Every 6 hours 11/24/21 1437 11/28/21 2110       Objective:  Vital Signs  Vitals:   12/09/21 1635 12/09/21 1936 12/09/21 2320 12/10/21 0500  BP: 118/86 132/79 107/72   Pulse: 82 100 75   Resp: 16 18 14    Temp: 98.1 F (36.7 C) 98.7 F (37.1 C) 98.3 F (36.8 C)   TempSrc: Oral Oral    SpO2: 100% 95% 98%   Weight:    54.3 kg  Height:        Intake/Output Summary (Last 24 hours) at 12/10/2021 0741 Last data filed at 12/10/2021 0643 Gross per 24 hour  Intake 240 ml  Output 700 ml  Net -460 ml    Filed Weights   12/08/21 0500 12/09/21 0500 12/10/21 0500  Weight: 53 kg 54.6 kg 54.3 kg    General appearance: Awake alert.  In no distress.  More appropriate  today, impulsive but able to follow commands and speech less rapid and more intelligible. NG feeding tube is noted Resp: Clear to auscultation bilaterally.  Normal effort Cardio: S1-S2 is normal regular.  No S3-S4.  No rubs murmurs or bruit GI: Abdomen is soft.  Nontender nondistended.  Bowel sounds are present normal.  No masses organomegaly Extremities: No edema.  Right hemiparesis noted.  Lab Results:  Data Reviewed: I have personally reviewed following labs and reports of the imaging studies  CBC: Recent Labs  Lab 12/06/21 0733  WBC 11.1*  HGB 12.7*  HCT 36.0*  MCV 102.6*  PLT 531*     Basic Metabolic Panel: Recent Labs  Lab 12/06/21 0733  NA 133*  K 4.2  CL 99  CO2 23  GLUCOSE 129*  BUN 18  CREATININE 0.76  CALCIUM 9.2  MG 1.9  PHOS 4.7*     GFR: Estimated Creatinine Clearance: 73.5 mL/min (by C-G  formula based on SCr of 0.76 mg/dL).  Liver Function Tests: Recent Labs  Lab 12/06/21 0733  AST 71*  ALT 90*  ALKPHOS 159*  BILITOT 0.1*  PROT 7.4  ALBUMIN 3.0*      CBG: Recent Labs  Lab 12/09/21 0442 12/09/21 0634 12/09/21 1158 12/09/21 1634 12/09/21 2053  GLUCAP 149* 112* 129* 106* 154*       No results found for this or any previous visit (from the past 240 hour(s)).     Radiology Studies: No results found.     LOS: 22 days   Azucena Fallen  [facility] Pager on [email or web address]  12/10/2021, 7:41 AM

## 2021-12-11 DIAGNOSIS — I610 Nontraumatic intracerebral hemorrhage in hemisphere, subcortical: Secondary | ICD-10-CM | POA: Diagnosis not present

## 2021-12-11 LAB — GLUCOSE, CAPILLARY
Glucose-Capillary: 120 mg/dL — ABNORMAL HIGH (ref 70–99)
Glucose-Capillary: 112 mg/dL — ABNORMAL HIGH (ref 70–99)
Glucose-Capillary: 116 mg/dL — ABNORMAL HIGH (ref 70–99)
Glucose-Capillary: 117 mg/dL — ABNORMAL HIGH (ref 70–99)

## 2021-12-11 NOTE — Progress Notes (Signed)
[facility] PROGRESS NOTE   Brent Warren [id number]:[id number] DOB: [birth year] DOA: 11/18/2021  PCP: Marcine Matar, MD  Brief History/Interval Summary: 61 year old male with history of HTN, tobacco use, chronic alcohol use presented with sudden onset slurred speech and right-sided weakness.  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  He was admitted to the neuro ICU, remained stable and transferred to the hospitalist service on 11/13.  Patient also developed alcohol withdrawal during this hospitalization.  Consultants: Neurology.  Critical care medicine.   Subjective/Interval History: No acute issues or events noted overnight, patient continues to be more awake alert oriented and interactive, NG tube out patient is tolerating diet quite well this morning, excited for disposition planning as he is " ready to get out of here"  Assessment/Plan:  Left basal ganglia ICH -Neurology was following and signed off on 11/24/2021 and recommended outpatient follow-up with neurology -Echo showed EF of 60 to 65%.  LDL 62, A1c is 5.1 -PT/OT recommending CIR.  As per CIR, patient is not a good candidate for CIR.   TOC following for SNF placement.  Feeding tube will need to be sorted out before he can be discharged.   -Speech and mental status continues to improve slowly, patient has been off restraints, limiting Ativan as well as tolerated, last dose 12/1  Oropharyngeal dysphagia Moderate to severe protein caloric malnutrition Initially was kept n.p.o. and started on nasogastric tube feedings.   Seen by speech therapy and diet was advanced.  Currently on dysphagia 2 diet.  Continue to follow aspiration precautions. NG tube removed 12/10/2021, continues to advance diet appropriately, continue calorie counting but currently eating greater than 50% of his meals  Abdominal discomfort, unspecified, on 11/27 -resolved Unspecified, resolved with bowel movement and urination, no further  episodes of abdominal discomfort constipation or urinary hesitancy urgency or obstruction  Agitation, resolving Continue Seroquel 50 mg twice daily, Ativan as needed but not used in the past 72 hours, no further requirements for restraints given NG tube is out and he is much more appropriate   Alcohol abuse, alcohol withdrawal with delirium tremens, resolving Significant withdrawal symptoms have resolved, completed phenobarbital taper, no longer requiring Ativan, no longer scoring on CIWA protocol Continue increased p.o. intake, vitamin supplementation with thiamine folic acid and multivitamin  Essential hypertension Currently well-controlled on amlodipine, metoprolol  PSVT Echo unremarkable, heart rate now well-controlled on metoprolol TSH, T4 within normal limits   Hypokalemia/hyponatremia Improved, follow as indicated  Possible aspiration pneumonia/Possible UTI UA suggestive of UTI, culture negative, completed antibiotic course  Vitamin B12 deficiency B12 177.  Folate 12.7.   Continues to cyanocobalamine.     Macrocytic anemia Secondary to alcohol abuse as well as B12 deficiency. Ferritin noted to be 1074.  Iron 95.  TIBC 252.   Elevated LFTs Secondary to alcohol use/abuse and malnutrition Hepatitis panel is unremarkable.   Tobacco use, history of emphysema Continue nebs as needed Respiratory status is stable.  Severe protein calorie malnutrition Nutrition Problem: Severe Malnutrition Etiology: social / environmental circumstances Signs/Symptoms: severe fat depletion, severe muscle depletion   DVT Prophylaxis: Subcutaneous heparin Code Status: Full code Family Communication: No family at bedside, attempted to call brother, no answer Disposition Plan: SNF pending given NG tube removal and patient's mental status is improving  Status is: Inpatient Remains inpatient appropriate because: Intracranial hemorrhage, dysphagia  Medications: Scheduled:  amLODipine  10 mg  Per Tube Daily   ascorbic acid  500 mg Oral Daily  vitamin B-12  1,000 mcg Per Tube Daily   feeding supplement  237 mL Oral TID BM   feeding supplement (PROSource TF20)  60 mL Per Tube Daily   folic acid  1 mg Per Tube Daily   heparin injection (subcutaneous)  5,000 Units Subcutaneous Q8H   hydrocortisone cream   Topical BID   metoprolol tartrate  50 mg Per Tube BID   multivitamin with minerals  1 tablet Per Tube Daily   mouth rinse  15 mL Mouth Rinse 4 times per day   pantoprazole  40 mg Oral Daily   pyridOXINE  100 mg Oral Daily   QUEtiapine  50 mg Per Tube BID   sodium chloride flush  3 mL Intravenous Once   thiamine  100 mg Per Tube Daily   vitamin A  10,000 Units Oral Daily   zinc sulfate  220 mg Oral Daily   Continuous:   [id number] **OR** acetaminophen (TYLENOL) oral liquid 160 mg/5 mL **OR** acetaminophen, ipratropium-albuterol, labetalol, loperamide, LORazepam, mouth rinse  Antibiotics: Anti-infectives (From admission, onward)    Start     Dose/Rate Route Frequency Ordered Stop   11/24/21 1530  Ampicillin-Sulbactam (UNASYN) 3 g in sodium chloride 0.9 % 100 mL IVPB        3 g 200 mL/hr over 30 Minutes Intravenous Every 6 hours 11/24/21 1437 11/28/21 2110       Objective:  Vital Signs  Vitals:   12/10/21 0500 12/10/21 1953 12/11/21 0025 12/11/21 0425  BP:  114/78 112/68   Pulse:  82    Resp:  16 18   Temp:  98.2 F (36.8 C) 98.5 F (36.9 C)   TempSrc:  Oral Oral   SpO2:  96% 95%   Weight: 54.3 kg   56.1 kg  Height:        Intake/Output Summary (Last 24 hours) at 12/11/2021 0738 Last data filed at 12/10/2021 1600 Gross per 24 hour  Intake 800 ml  Output --  Net 800 ml    Filed Weights   12/09/21 0500 12/10/21 0500 12/11/21 0425  Weight: 54.6 kg 54.3 kg 56.1 kg    General appearance: Awake alert.  In no distress.  More appropriate today, impulsive but able to follow commands and speech less rapid and more intelligible. NG feeding tube is  noted Resp: Clear to auscultation bilaterally.  Normal effort Cardio: S1-S2 is normal regular.  No S3-S4.  No rubs murmurs or bruit GI: Abdomen is soft.  Nontender nondistended.  Bowel sounds are present normal.  No masses organomegaly Extremities: No edema.  Right hemiparesis noted.  Lab Results:  Data Reviewed: I have personally reviewed following labs and reports of the imaging studies  CBC: Recent Labs  Lab 12/06/21 0733  WBC 11.1*  HGB 12.7*  HCT 36.0*  MCV 102.6*  PLT 531*     Basic Metabolic Panel: Recent Labs  Lab 12/06/21 0733  NA 133*  K 4.2  CL 99  CO2 23  GLUCOSE 129*  BUN 18  CREATININE 0.76  CALCIUM 9.2  MG 1.9  PHOS 4.7*     GFR: Estimated Creatinine Clearance: 76 mL/min (by C-G formula based on SCr of 0.76 mg/dL).  Liver Function Tests: Recent Labs  Lab 12/06/21 0733  AST 71*  ALT 90*  ALKPHOS 159*  BILITOT 0.1*  PROT 7.4  ALBUMIN 3.0*      CBG: Recent Labs  Lab 12/09/21 2053 12/10/21 1158 12/10/21 1653 12/10/21 2152 12/11/21 0639  GLUCAP 154* 113*  123* 130* 120*       No results found for this or any previous visit (from the past 240 hour(s)).     Radiology Studies: No results found.     LOS: 23 days   Azucena Fallen  [facility] Pager on [email or web address]  12/11/2021, 7:38 AM

## 2021-12-12 DIAGNOSIS — I610 Nontraumatic intracerebral hemorrhage in hemisphere, subcortical: Secondary | ICD-10-CM | POA: Diagnosis not present

## 2021-12-12 LAB — GLUCOSE, CAPILLARY
Glucose-Capillary: 110 mg/dL — ABNORMAL HIGH (ref 70–99)
Glucose-Capillary: 111 mg/dL — ABNORMAL HIGH (ref 70–99)
Glucose-Capillary: 125 mg/dL — ABNORMAL HIGH (ref 70–99)
Glucose-Capillary: 125 mg/dL — ABNORMAL HIGH (ref 70–99)

## 2021-12-12 MED ORDER — FOLIC ACID 1 MG PO TABS
1.0000 mg | ORAL_TABLET | Freq: Every day | ORAL | Status: DC
  Administered 2021-12-13 – 2021-12-28 (×16): 1 mg via ORAL
  Filled 2021-12-12 (×17): qty 1

## 2021-12-12 MED ORDER — VITAMIN B-12 1000 MCG PO TABS
1000.0000 ug | ORAL_TABLET | Freq: Every day | ORAL | Status: DC
  Administered 2021-12-13 – 2021-12-28 (×16): 1000 ug via ORAL
  Filled 2021-12-12 (×17): qty 1

## 2021-12-12 MED ORDER — ADULT MULTIVITAMIN W/MINERALS CH
1.0000 | ORAL_TABLET | Freq: Every day | ORAL | Status: DC
  Administered 2021-12-13 – 2021-12-28 (×16): 1 via ORAL
  Filled 2021-12-12 (×17): qty 1

## 2021-12-12 MED ORDER — THIAMINE MONONITRATE 100 MG PO TABS
100.0000 mg | ORAL_TABLET | Freq: Every day | ORAL | Status: DC
  Administered 2021-12-13 – 2021-12-28 (×16): 100 mg via ORAL
  Filled 2021-12-12 (×17): qty 1

## 2021-12-12 NOTE — Progress Notes (Signed)
Occupational Therapy Treatment Patient Details Name: Brent Warren MRN: [id number] DOB: [birth year] Today's Date: 12/12/2021   History of present illness 61 yo male presenting 11/11 with R-sided weakness, facial droop and slurred speech. 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 PMH includes: HTN, COPD, and alcohol abuse.   OT comments  Patient received in supine and declined getting OOB into recliner but stated he needed to use BSC. Patient was min assist to get to EOB and mod assist to transfer to [facility] with 1 person HHA. Patient stood from [facility] with mod assist and was able to use bed rail to assist with standing balance during toilet hygiene and patient asked to return to supine. AAROM exercises performed to RUE with elbow flexion and extension performed with gravity eliminated. Acute OT to continue to follow.    Recommendations for follow up therapy are one component of a multi-disciplinary discharge planning process, led by the attending physician.  Recommendations may be updated based on patient status, additional functional criteria and insurance authorization.    Follow Up Recommendations  Acute inpatient rehab (3hours/day)     Assistance Recommended at Discharge Frequent or constant Supervision/Assistance  Patient can return home with the following  Two people to help with walking and/or transfers;Two people to help with bathing/dressing/bathroom;Direct supervision/assist for medications management;Direct supervision/assist for financial management;Assistance with feeding;Assistance with cooking/housework;Assist for transportation;Help with stairs or ramp for entrance   Equipment Recommendations  BSC/3in1;Wheelchair (measurements OT);Wheelchair cushion (measurements OT)    Recommendations for Other Services      Precautions / Restrictions Precautions Precautions: Fall Precaution Comments: watch HR Restrictions Weight Bearing Restrictions: No       Mobility Bed Mobility Overal  bed mobility: Needs Assistance Bed Mobility: Supine to Sit, Sit to Supine     Supine to sit: Min assist Sit to supine: Min assist   General bed mobility comments: assistance with BLEs and scooting to edge of bed    Transfers Overall transfer level: Needs assistance Equipment used: 1 person hand held assist Transfers: Sit to/from Stand, Bed to chair/wheelchair/BSC Sit to Stand: Mod assist Stand pivot transfers: Mod assist         General transfer comment: mod assist to transfer from EOB to [facility] and back to EOB     Balance Overall balance assessment: Needs assistance Sitting-balance support: Bilateral upper extremity supported, Feet supported, No upper extremity supported Sitting balance-Leahy Scale: Poor Sitting balance - Comments: reliant on UE support Postural control: Posterior lean Standing balance support: Single extremity supported, During functional activity Standing balance-Leahy Scale: Poor Standing balance comment: able to use bed rail to assist with standing balance during toilet hygiene                           ADL either performed or assessed with clinical judgement   ADL Overall ADL's : Needs assistance/impaired     Grooming: Wash/dry hands;Moderate assistance Grooming Details (indicate cue type and reason): able to use LUE to wash face but mod assist when using RUE                 Toilet Transfer: Moderate assistance;Stand-pivot;BSC/3in1 Toilet Transfer Details (indicate cue type and reason): HHA to transfer from EOB to [facility] and back Toileting- Clothing Manipulation and Hygiene: Total assistance;Sit to/from stand Toileting - Clothing Manipulation Details (indicate cue type and reason): patient attempted but unable to perform seated       General ADL Comments: Willing to perform  toilet transfer but asked to return to bed    Extremity/Trunk Assessment Upper Extremity Assessment RUE Deficits / Details: increased flexor tone. Pt able to  actively flex R digits with 2/+5 grasp. Able to perform PROM elbow extension to ~20 degrees, but unable to fully extend RUE Sensation: decreased light touch RUE Coordination: decreased fine motor;decreased gross motor LUE Deficits / Details: Able to wash face, ROM seems WFL.            Vision       Perception     Praxis      Cognition Arousal/Alertness: Awake/alert Behavior During Therapy: Flat affect Overall Cognitive Status: Difficult to assess Area of Impairment: Following commands, Orientation, Attention, Problem solving, Awareness, Safety/judgement                   Current Attention Level: Focused, Sustained   Following Commands: Follows one step commands with increased time Safety/Judgement: Decreased awareness of safety, Decreased awareness of deficits Awareness: Intellectual Problem Solving: Slow processing General Comments: difficult to understand at time        Exercises Exercises: General Upper Extremity General Exercises - Upper Extremity Shoulder Flexion: AAROM, Right, 10 reps Shoulder ABduction: PROM, Right, 10 reps Elbow Flexion: AAROM, 10 reps, Right Elbow Extension: AAROM, Right, 10 reps    Shoulder Instructions       General Comments      Pertinent Vitals/ Pain       Pain Assessment Pain Assessment: Faces Faces Pain Scale: No hurt Pain Intervention(s): Monitored during session  Home Living                                          Prior Functioning/Environment              Frequency  Min 2X/week        Progress Toward Goals  OT Goals(current goals can now be found in the care plan section)  Progress towards OT goals: Progressing toward goals  Acute Rehab OT Goals Patient Stated Goal: get better OT Goal Formulation: With patient Time For Goal Achievement: 12/18/21 Potential to Achieve Goals: Fair ADL Goals Pt Will Perform Grooming: with min assist;sitting Pt Will Perform Upper Body Bathing:  with min guard assist;sitting Pt Will Transfer to Toilet: with +2 assist;with min assist;stand pivot transfer;bedside commode Pt/caregiver will Perform Home Exercise Program: Increased ROM;Right Upper extremity;With minimal assist Additional ADL Goal #1: pt will complete basic transfer +2 min (A) as precursor to adls  Plan Discharge plan remains appropriate;Frequency remains appropriate    Co-evaluation                 AM-PAC OT "6 Clicks" Daily Activity     Outcome Measure   Help from another person eating meals?: A Lot Help from another person taking care of personal grooming?: A Lot Help from another person toileting, which includes using toliet, bedpan, or urinal?: Total Help from another person bathing (including washing, rinsing, drying)?: Total Help from another person to put on and taking off regular upper body clothing?: A Lot Help from another person to put on and taking off regular lower body clothing?: A Lot 6 Click Score: 10    End of Session Equipment Utilized During Treatment: Gait belt;Other (comment) (BSC)  OT Visit Diagnosis: Unsteadiness on feet (R26.81);Muscle weakness (generalized) (M62.81);Hemiplegia and hemiparesis;Low vision, both eyes (H54.2);Other symptoms and signs involving cognitive  function;Other symptoms and signs involving the nervous system (R29.898) Hemiplegia - Right/Left: Right Hemiplegia - dominant/non-dominant: Dominant Hemiplegia - caused by: Other Nontraumatic intracranial hemorrhage   Activity Tolerance Patient tolerated treatment well   Patient Left in bed;with call bell/phone within reach;with bed alarm set   Nurse Communication Mobility status        Time: 7159-5396 OT Time Calculation (min): 21 min  Charges: OT General Charges $OT Visit: 1 Visit OT Treatments $Self Care/Home Management : 8-22 mins  Alfonse Flavors, OTA Acute Rehabilitation Services  Office [phone]   Dewain Penning 12/12/2021, 11:53 AM

## 2021-12-12 NOTE — Progress Notes (Signed)
   12/11/21 2116  What Happened  Was fall witnessed? No  Was patient injured? No  Patient found on floor  Found by Staff-comment Alverda Skeans Reather Converse RN)  Stated prior activity other (comment) (out of bed with 2 person assist)  Provider Notification  Provider Name/Title Dr. Dalene Carrow  Date Provider Notified 12/11/21  Time Provider Notified 2120  Method of Notification Page  Notification Reason Fall  Provider response See new orders;Evaluate remotely  Date of Provider Response 12/11/21  Time of Provider Response 2038  Follow Up  Family notified Yes - comment ([name] Schroepfer,  Brother)  Time family notified 2230  Additional tests No  Simple treatment Dressing  Progress note created (see row info) Yes  Adult Fall Risk Assessment  Risk Factor Category (scoring not indicated) Fall has occurred during this admission (document High fall risk)  Patient Fall Risk Level High fall risk  Adult Fall Risk Interventions  Required Bundle Interventions *See Row Information* High fall risk - low, moderate, and high requirements implemented  Additional Interventions Reorient/diversional activities with confused patients;Use of appropriate toileting equipment (bedpan, BSC, etc.)  Screening for Fall Injury Risk (To be completed on HIGH fall risk patients) - Assessing Need for Floor Mats  Risk For Fall Injury- Criteria for Floor Mats Confusion/dementia (+NuDESC, CIWA, TBI, etc.);Noncompliant with safety precautions  Will Implement Floor Mats Yes

## 2021-12-12 NOTE — TOC Progression Note (Signed)
Transition of Care [facility]) - Progression Note    Patient Details  Name: Brent Warren MRN: [id number] Date of Birth: Feb 19, 1960  Transition of Care [facility]) CM/SW Contact  Tory Emerald, [place] Phone Number: 12/12/2021, 12:13 PM  Clinical Narrative:     CSW re-faxed pt's initial summary to SNFs in [place]. Will f/u on any bed acceptances.   Expected Discharge Plan: Skilled Nursing Facility Barriers to Discharge: Continued Medical Work up, No SNF bed, SNF Pending transportation  Expected Discharge Plan and Services Expected Discharge Plan: Skilled Nursing Facility     Post Acute Care Choice: Skilled Nursing Facility Living arrangements for the past 2 months: Single Family Home                                       Social Determinants of Health (SDOH) Interventions    Readmission Risk Interventions     No data to display

## 2021-12-12 NOTE — Progress Notes (Signed)
[facility] PROGRESS NOTE   Brent Warren WUJ:[id number] DOB: 01/30/1960 DOA: 11/18/2021  PCP: Marcine Matar, MD  Brief History/Interval Summary: 61 year old male with history of HTN, tobacco use, chronic alcohol use presented with sudden onset slurred speech and right-sided weakness.  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  He was admitted to the neuro ICU, remained stable and transferred to the hospitalist service on 11/13.  Patient also developed alcohol withdrawal during this hospitalization.  Consultants: Neurology.  Critical care medicine.   Subjective/Interval History: Patient found out of bed last night on the floor, no fall no pain no deficits just mild confusion.  Otherwise continues to improve tolerated entirety of breakfast and is "ready to go" which we discussed we are waiting on safe disposition at rehab.  He is otherwise medically stable for discharge.  Assessment/Plan:  Left basal ganglia ICH -Neurology was following and signed off on 11/24/2021 and recommended outpatient follow-up with neurology -Echo showed EF of 60 to 65%.  LDL 62, A1c is 5.1 -PT/OT recommending CIR.  As per CIR, patient is not a good candidate for CIR.   TOC following for SNF placement.  Feeding tube will need to be sorted out before he can be discharged.   -Speech and mental status continues to improve slowly, patient has been off restraints, limiting Ativan as well as tolerated, last dose 12/1  Oropharyngeal dysphagia Moderate to severe protein caloric malnutrition Initially was kept n.p.o. and started on nasogastric tube feedings.   Seen by speech therapy and diet was advanced.  Currently on dysphagia 2 diet.  Continue to follow aspiration precautions. NG tube removed 12/10/2021, continues to advance diet appropriately, continue calorie counting but currently eating greater than 50% of his meals  Abdominal discomfort, unspecified, on 11/27 -resolved Unspecified,  resolved with bowel movement and urination, no further episodes of abdominal discomfort constipation or urinary hesitancy urgency or obstruction  Agitation, resolving Continue Seroquel 50 mg twice daily, Ativan as needed but not used in the past 72 hours, no further requirements for restraints given NG tube is out and he is much more appropriate   Alcohol abuse, alcohol withdrawal with delirium tremens, resolving Significant withdrawal symptoms have resolved, completed phenobarbital taper, no longer requiring Ativan, no longer scoring on CIWA protocol Continue increased p.o. intake, vitamin supplementation with thiamine folic acid and multivitamin  Essential hypertension Currently well-controlled on amlodipine, metoprolol  PSVT Echo unremarkable, heart rate now well-controlled on metoprolol TSH, T4 within normal limits   Hypokalemia/hyponatremia Improved, follow as indicated  Possible aspiration pneumonia/Possible UTI UA suggestive of UTI, culture negative, completed antibiotic course  Vitamin B12 deficiency B12 177.  Folate 12.7.   Continues to cyanocobalamine.     Macrocytic anemia Secondary to alcohol abuse as well as B12 deficiency. Ferritin noted to be 1074.  Iron 95.  TIBC 252.   Elevated LFTs Secondary to alcohol use/abuse and malnutrition Hepatitis panel is unremarkable.   Tobacco use, history of emphysema Continue nebs as needed Respiratory status is stable.  Severe protein calorie malnutrition Nutrition Problem: Severe Malnutrition Etiology: social / environmental circumstances Signs/Symptoms: severe fat depletion, severe muscle depletion   DVT Prophylaxis: Subcutaneous heparin Code Status: Full code Family Communication: No family at bedside, attempted to call brother, no answer - patient states he will update them Disposition Plan: SNF pending as above  Status is: Inpatient Remains inpatient appropriate because: Intracranial hemorrhage,  dysphagia  Medications: Scheduled:  amLODipine  10 mg Per Tube Daily  ascorbic acid  500 mg Oral Daily   vitamin B-12  1,000 mcg Per Tube Daily   feeding supplement  237 mL Oral TID BM   feeding supplement (PROSource TF20)  60 mL Per Tube Daily   folic acid  1 mg Per Tube Daily   heparin injection (subcutaneous)  5,000 Units Subcutaneous Q8H   hydrocortisone cream   Topical BID   metoprolol tartrate  50 mg Per Tube BID   multivitamin with minerals  1 tablet Per Tube Daily   mouth rinse  15 mL Mouth Rinse 4 times per day   pantoprazole  40 mg Oral Daily   pyridOXINE  100 mg Oral Daily   QUEtiapine  50 mg Per Tube BID   sodium chloride flush  3 mL Intravenous Once   thiamine  100 mg Per Tube Daily   vitamin A  10,000 Units Oral Daily   zinc sulfate  220 mg Oral Daily   Continuous:   [id number] **OR** acetaminophen (TYLENOL) oral liquid 160 mg/5 mL **OR** acetaminophen, ipratropium-albuterol, labetalol, loperamide, LORazepam, mouth rinse  Antibiotics: Anti-infectives (From admission, onward)    Start     Dose/Rate Route Frequency Ordered Stop   11/24/21 1530  Ampicillin-Sulbactam (UNASYN) 3 g in sodium chloride 0.9 % 100 mL IVPB        3 g 200 mL/hr over 30 Minutes Intravenous Every 6 hours 11/24/21 1437 11/28/21 2110       Objective:  Vital Signs  Vitals:   12/11/21 2119 12/11/21 2345 12/12/21 0443 12/12/21 0518  BP: (!) 141/83 129/80  115/80  Pulse: 79 80  79  Resp: 17 16  18   Temp: 97.7 F (36.5 C) 98.2 F (36.8 C)  98.1 F (36.7 C)  TempSrc: Oral Tympanic  Oral  SpO2: 95% 99%  98%  Weight:   53.8 kg   Height:        Intake/Output Summary (Last 24 hours) at 12/12/2021 0720 Last data filed at 12/11/2021 1200 Gross per 24 hour  Intake --  Output 300 ml  Net -300 ml    Filed Weights   12/10/21 0500 12/11/21 0425 12/12/21 0443  Weight: 54.3 kg 56.1 kg 53.8 kg    General appearance: Awake alert.  In no distress.  More appropriate today, impulsive  but able to follow commands and speech less rapid and more intelligible. Resp: Clear to auscultation bilaterally.  Normal effort Cardio: S1-S2 is normal regular.  No S3-S4.  No rubs murmurs or bruit GI: Abdomen is soft.  Nontender nondistended.  Bowel sounds are present normal.  No masses organomegaly Extremities: No edema.  Right hemiparesis noted.  Lab Results:  Data Reviewed: I have personally reviewed following labs and reports of the imaging studies  CBC: Recent Labs  Lab 12/06/21 0733  WBC 11.1*  HGB 12.7*  HCT 36.0*  MCV 102.6*  PLT 531*     Basic Metabolic Panel: Recent Labs  Lab 12/06/21 0733  NA 133*  K 4.2  CL 99  CO2 23  GLUCOSE 129*  BUN 18  CREATININE 0.76  CALCIUM 9.2  MG 1.9  PHOS 4.7*     GFR: Estimated Creatinine Clearance: 72.9 mL/min (by C-G formula based on SCr of 0.76 mg/dL).  Liver Function Tests: Recent Labs  Lab 12/06/21 0733  AST 71*  ALT 90*  ALKPHOS 159*  BILITOT 0.1*  PROT 7.4  ALBUMIN 3.0*      CBG: Recent Labs  Lab 12/11/21 0639 12/11/21 1235 12/11/21 1628 12/11/21  2124 12/12/21 0606  GLUCAP 120* 112* 116* 117* 110*       No results found for this or any previous visit (from the past 240 hour(s)).     Radiology Studies: No results found.     LOS: 24 days   [facility] Pager on [email or web address]  12/12/2021, 7:20 AM

## 2021-12-12 NOTE — Progress Notes (Signed)
Mobility Specialist Progress Note   12/12/21 1515  Mobility  Activity Transferred from bed to chair  Level of Assistance Minimal assist, patient does 75% or more  Assistive Device Front wheel walker  Distance Ambulated (ft) 3 ft  Range of Motion/Exercises Active;All extremities  Activity Response Tolerated well   Patient received in supine, eager to participate. Was independent for bed mobility and stood with min A. Was able to take steps to recliner but required min A and mod cues for safety second to being impulsive.Tolerated without complaint or incident. Was left in recliner with all needs met, call bell in reach and chair alarm set. Staff notified that patient is OOB.  [place] Katharine Rochefort, BS EXP Mobility Specialist Please contact via SecureChat or Rehab office at [phone]

## 2021-12-12 NOTE — Plan of Care (Signed)
  Problem: Education: Goal: Knowledge of disease or condition will improve Outcome: Progressing   

## 2021-12-13 DIAGNOSIS — I10 Essential (primary) hypertension: Secondary | ICD-10-CM | POA: Diagnosis not present

## 2021-12-13 DIAGNOSIS — R1312 Dysphagia, oropharyngeal phase: Secondary | ICD-10-CM | POA: Diagnosis not present

## 2021-12-13 LAB — GLUCOSE, CAPILLARY
Glucose-Capillary: 125 mg/dL — ABNORMAL HIGH (ref 70–99)
Glucose-Capillary: 116 mg/dL — ABNORMAL HIGH (ref 70–99)
Glucose-Capillary: 100 mg/dL — ABNORMAL HIGH (ref 70–99)

## 2021-12-13 NOTE — Plan of Care (Signed)
  Problem: Education: Goal: Knowledge of disease or condition will improve Outcome: Progressing Goal: Knowledge of secondary prevention will improve (MUST DOCUMENT ALL) Outcome: Progressing Goal: Knowledge of patient specific risk factors will improve Brent Warren N/A or DELETE if not current risk factor) Outcome: Progressing   Problem: Coping: Goal: Will verbalize positive feelings about self Outcome: Progressing Goal: Will identify appropriate support needs Outcome: Progressing   Problem: Health Behavior/Discharge Planning: Goal: Ability to manage health-related needs will improve Outcome: Progressing Goal: Goals will be collaboratively established with patient/family Outcome: Progressing   Problem: Self-Care: Goal: Ability to participate in self-care as condition permits will improve Outcome: Progressing Goal: Verbalization of feelings and concerns over difficulty with self-care will improve Outcome: Progressing Goal: Ability to communicate needs accurately will improve Outcome: Progressing   Problem: Nutrition: Goal: Risk of aspiration will decrease Outcome: Progressing Goal: Dietary intake will improve Outcome: Progressing   Problem: Education: Goal: Knowledge of General Education information will improve Description: Including pain rating scale, medication(s)/side effects and non-pharmacologic comfort measures Outcome: Progressing   Problem: Health Behavior/Discharge Planning: Goal: Ability to manage health-related needs will improve Outcome: Progressing   Problem: Clinical Measurements: Goal: Ability to maintain clinical measurements within normal limits will improve Outcome: Progressing Goal: Will remain free from infection Outcome: Progressing Goal: Diagnostic test results will improve Outcome: Progressing Goal: Respiratory complications will improve Outcome: Progressing Goal: Cardiovascular complication will be avoided Outcome: Progressing   Problem:  Activity: Goal: Risk for activity intolerance will decrease Outcome: Progressing   Problem: Nutrition: Goal: Adequate nutrition will be maintained Outcome: Progressing   Problem: Elimination: Goal: Will not experience complications related to bowel motility Outcome: Progressing   Problem: Pain Managment: Goal: General experience of comfort will improve Outcome: Progressing   Problem: Safety: Goal: Ability to remain free from injury will improve Outcome: Progressing   Problem: Skin Integrity: Goal: Risk for impaired skin integrity will decrease Outcome: Progressing   Problem: Education: Goal: Ability to describe self-care measures that may prevent or decrease complications (Diabetes Survival Skills Education) will improve Outcome: Progressing Goal: Individualized Educational Video(s) Outcome: Progressing   Problem: Fluid Volume: Goal: Ability to maintain a balanced intake and output will improve Outcome: Progressing   Problem: Health Behavior/Discharge Planning: Goal: Ability to identify and utilize available resources and services will improve Outcome: Progressing Goal: Ability to manage health-related needs will improve Outcome: Progressing   Problem: Metabolic: Goal: Ability to maintain appropriate glucose levels will improve Outcome: Progressing   Problem: Nutritional: Goal: Maintenance of adequate nutrition will improve Outcome: Progressing Goal: Progress toward achieving an optimal weight will improve Outcome: Progressing   Problem: Skin Integrity: Goal: Risk for impaired skin integrity will decrease Outcome: Progressing   Problem: Intracerebral Hemorrhage Tissue Perfusion: Goal: Complications of Intracerebral Hemorrhage will be minimized Outcome: Completed/Met   Problem: Coping: Goal: Level of anxiety will decrease Outcome: Completed/Met   Problem: Elimination: Goal: Will not experience complications related to urinary retention Outcome:  Completed/Met   Problem: Coping: Goal: Ability to adjust to condition or change in health will improve Outcome: Completed/Met   Problem: Tissue Perfusion: Goal: Adequacy of tissue perfusion will improve Outcome: Completed/Met   Problem: Safety: Goal: Non-violent Restraint(s) Outcome: Not Applicable

## 2021-12-13 NOTE — Progress Notes (Signed)
Physical Therapy Treatment Patient Details Name: Brent Warren MRN: [id number] DOB: 14-Sep-1960 Today's Date: 12/13/2021   History of Present Illness 61 yo male presenting 11/11 with R-sided weakness, facial droop and slurred speech. 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 PMH includes: HTN, COPD, and alcohol abuse.    PT Comments    Pt greeted supine in bed and agreeable to session with continued progress towards goals. Pt able to come to sit EOB with min assist to elevate trunk and scoot to EOB with cues throughout for sequencing. Pt declining gait training this session, however able to come to stand with HHA and min assist and step pivot to recliner with mod assist. Pt continues to demonstrate poor safety awareness, attempting to sit before close to chair and becoming slightly agitated with this PTA when direction and cues provided during transfer. Pt continues to benefit from skilled PT services to progress toward functional mobility goals.    Recommendations for follow up therapy are one component of a multi-disciplinary discharge planning process, led by the attending physician.  Recommendations may be updated based on patient status, additional functional criteria and insurance authorization.  Follow Up Recommendations  Acute inpatient rehab (3hours/day)     Assistance Recommended at Discharge Frequent or constant Supervision/Assistance  Patient can return home with the following A lot of help with walking and/or transfers;A lot of help with bathing/dressing/bathroom;Assistance with cooking/housework;Direct supervision/assist for medications management;Direct supervision/assist for financial management;Assist for transportation;Help with stairs or ramp for entrance   Equipment Recommendations  Other (comment)    Recommendations for Other Services       Precautions / Restrictions Precautions Precautions: Fall Precaution Comments: watch HR Restrictions Weight Bearing  Restrictions: No     Mobility  Bed Mobility Overal bed mobility: Needs Assistance Bed Mobility: Supine to Sit     Supine to sit: Min assist     General bed mobility comments: assistance with BLEs and scooting to edge of bed    Transfers Overall transfer level: Needs assistance Equipment used: 1 person hand held assist Transfers: Sit to/from Stand, Bed to chair/wheelchair/BSC Sit to Stand: Min assist   Step pivot transfers: Mod assist       General transfer comment: mod assist to transfer from EOB recliner, pt becoming slightly aggitated with direction from this PTA on direction of turning and turning opposite way    Ambulation/Gait               General Gait Details: pt declining   Stairs             Wheelchair Mobility    Modified Rankin (Stroke Patients Only) Modified Rankin (Stroke Patients Only) Pre-Morbid Rankin Score: No symptoms Modified Rankin: Moderately severe disability     Balance Overall balance assessment: Needs assistance Sitting-balance support: Bilateral upper extremity supported, Feet supported, No upper extremity supported Sitting balance-Leahy Scale: Poor Sitting balance - Comments: reliant on UE support Postural control: Posterior lean Standing balance support: Single extremity supported, During functional activity Standing balance-Leahy Scale: Poor                              Cognition Arousal/Alertness: Awake/alert Behavior During Therapy: Flat affect Overall Cognitive Status: Difficult to assess Area of Impairment: Following commands, Orientation, Attention, Problem solving, Awareness, Safety/judgement                   Current Attention Level: Focused, Sustained   Following  Commands: Follows one step commands with increased time Safety/Judgement: Decreased awareness of safety, Decreased awareness of deficits Awareness: Intellectual Problem Solving: Slow processing General Comments: difficult to  understand at times        Exercises      General Comments General comments (skin integrity, edema, etc.): VSS on RA      Pertinent Vitals/Pain Pain Assessment Pain Assessment: Faces Faces Pain Scale: No hurt Pain Intervention(s): Monitored during session    Home Living                          Prior Function            PT Goals (current goals can now be found in the care plan section) Acute Rehab PT Goals PT Goal Formulation: With patient Time For Goal Achievement: 12/03/21 Progress towards PT goals: Progressing toward goals    Frequency    Min 3X/week      PT Plan Current plan remains appropriate    Co-evaluation              AM-PAC PT "6 Clicks" Mobility   Outcome Measure  Help needed turning from your back to your side while in a flat bed without using bedrails?: A Lot Help needed moving from lying on your back to sitting on the side of a flat bed without using bedrails?: A Lot Help needed moving to and from a bed to a chair (including a wheelchair)?: Total Help needed standing up from a chair using your arms (e.g., wheelchair or bedside chair)?: A Lot Help needed to walk in hospital room?: A Lot Help needed climbing 3-5 steps with a railing? : A Lot 6 Click Score: 11    End of Session Equipment Utilized During Treatment: Gait belt Activity Tolerance: Patient tolerated treatment well Patient left: in chair;with call bell/phone within reach;with chair alarm set Nurse Communication: Mobility status PT Visit Diagnosis: Other abnormalities of gait and mobility (R26.89);Muscle weakness (generalized) (M62.81);Hemiplegia and hemiparesis Hemiplegia - Right/Left: Right Hemiplegia - dominant/non-dominant: Dominant Hemiplegia - caused by: Nontraumatic intracerebral hemorrhage     Time: 0955-1010 PT Time Calculation (min) (ACUTE ONLY): 15 min  Charges:  $Therapeutic Activity: 8-22 mins                     Faris Coolman R. PTA Acute Rehabilitation  Services Office: [phone]    Catalina Antigua 12/13/2021, 10:55 AM

## 2021-12-13 NOTE — Progress Notes (Signed)
[facility] PROGRESS NOTE   Brent Warren [id number] DOB: Aug 24, 1960 DOA: 11/18/2021  PCP: Ladell Pier, MD  Brief History/Interval Summary: 61 year old male with history of HTN, tobacco use, chronic alcohol use presented with sudden onset slurred speech and right-sided weakness.  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  He was admitted to the neuro ICU, remained stable and transferred to the hospitalist service on 11/13.  Patient also developed alcohol withdrawal during this hospitalization.  Consultants: Neurology.  Critical care medicine.   Subjective/Interval History: Patient denies any complaints.  Wants to go home.    Assessment/Plan:  Left basal ganglia ICH -Neurology was following and signed off on 11/24/2021 and recommended outpatient follow-up with neurology -Echo showed EF of 60 to 65%.  LDL 62, A1c is 5.1 -PT/OT recommending CIR.  As per CIR, patient is not a good candidate for CIR.   TOC following for SNF placement.   NG Feeding tube has been removed. Agitation seems to be better as well.    Oropharyngeal dysphagia Initially was kept n.p.o. and started on nasogastric tube feedings.   Seen by speech therapy and diet was advanced.   Has had good caloric intake.  NG tube was removed on 12/3. Continue to encourage oral intake.    Essential hypertension Blood pressure reasonably well-controlled.  Noted to be on amlodipine and metoprolol.  N   PSVT Telemetry showed brief episodes of supraventricular tachycardia on 11/23. Echocardiogram shows normal systolic function.   TSH noted to be 5.0 with a normal free T4 of 0.77. Patient was started on beta-blocker.  Telemetry showed improvement.  Subsequently taken off of telemetry.  Agitation Seems to be stable on Seroquel twice a day.  Has not required restraints in greater than 48 hours.     Alcohol abuse, alcohol withdrawal with delirium tremens Patient was noted to have significant  alcohol withdrawal symptoms.  PCCM consulted and have signed off.  Completed phenobarbital taper.  Vitamin B1 normal Continue thiamine, folic acid and multivitamin   Hypokalemia/hyponatremia Has been stable.  Check labs tomorrow.  Possible aspiration pneumonia/Possible UTI UA suggestive of UTI as well.  Cultures negative. Was started on Unasyn on 11/17.  Completed 5 days of treatment.   Respiratory status noted to be stable.  He is noted to be afebrile.  Vitamin B12 deficiency B12 177.  Folate 12.7.   Continue cyanocobalamine.     Macrocytic anemia Secondary to alcohol abuse as well as B12 deficiency. Ferritin noted to be 1074.  Iron 95.  TIBC 252.   Elevated LFTs AST and ALT are noted to be minimally elevated.  Possibly from alcohol use.  No recent imaging study of the hepatobiliary system noted in EMR.   Since levels are reasonably stable this can be pursued in the outpatient setting. Hepatitis panel is unremarkable.   Tobacco use, history of emphysema Continue nebs as needed Respiratory status is stable.  Severe protein calorie malnutrition Nutrition Problem: Severe Malnutrition Etiology: social / environmental circumstances Signs/Symptoms: severe fat depletion, severe muscle depletion  Abdominal discomfort on 11/27 Reason for this was not entirely clear.  He did have multiple bowel movements after which his symptoms subsided.  Bladder scan did show greater than 400 mL but then he was able to void on his own.    DVT Prophylaxis: Subcutaneous heparin Code Status: Full code Family Communication: No family at bedside Disposition Plan: SNF  Status is: Inpatient Remains inpatient appropriate because: Intracranial hemorrhage, dysphagia  Medications: Scheduled:  amLODipine  10 mg Per Tube Daily   ascorbic acid  500 mg Oral Daily   vitamin B-12  1,000 mcg Oral Daily   feeding supplement  237 mL Oral TID BM   folic acid  1 mg Oral Daily   heparin injection  (subcutaneous)  5,000 Units Subcutaneous Q8H   hydrocortisone cream   Topical BID   metoprolol tartrate  50 mg Per Tube BID   multivitamin with minerals  1 tablet Oral Daily   mouth rinse  15 mL Mouth Rinse 4 times per day   pantoprazole  40 mg Oral Daily   pyridOXINE  100 mg Oral Daily   QUEtiapine  50 mg Per Tube BID   sodium chloride flush  3 mL Intravenous Once   thiamine  100 mg Oral Daily   vitamin A  10,000 Units Oral Daily   zinc sulfate  220 mg Oral Daily   Continuous:   [id number] **OR** acetaminophen (TYLENOL) oral liquid 160 mg/5 mL **OR** acetaminophen, ipratropium-albuterol, labetalol, loperamide, LORazepam, mouth rinse  Antibiotics: Anti-infectives (From admission, onward)    Start     Dose/Rate Route Frequency Ordered Stop   11/24/21 1530  Ampicillin-Sulbactam (UNASYN) 3 g in sodium chloride 0.9 % 100 mL IVPB        3 g 200 mL/hr over 30 Minutes Intravenous Every 6 hours 11/24/21 1437 11/28/21 2110       Objective:  Vital Signs  Vitals:   12/12/21 2341 12/13/21 0006 12/13/21 0309 12/13/21 0753  BP: 112/79 126/84 126/84 133/88  Pulse: 88 93 84 91  Resp: 18 18 18 18   Temp: 99.1 F (37.3 C) 98.9 F (37.2 C) 98.5 F (36.9 C) 97.7 F (36.5 C)  TempSrc: Oral Oral Oral Oral  SpO2: 97% 99% 94% 96%  Weight:      Height:        Intake/Output Summary (Last 24 hours) at 12/13/2021 1038 Last data filed at 12/13/2021 0542 Gross per 24 hour  Intake 970 ml  Output 250 ml  Net 720 ml    Filed Weights   12/10/21 0500 12/11/21 0425 12/12/21 0443  Weight: 54.3 kg 56.1 kg 53.8 kg    General appearance: Awake alert.  In no distress Resp: Clear to auscultation bilaterally.  Normal effort Cardio: S1-S2 is normal regular.  No S3-S4.  No rubs murmurs or bruit GI: Abdomen is soft.  Nontender nondistended.  Bowel sounds are present normal.  No masses organomegaly Extremities: No edema.      Lab Results:  Data Reviewed: I have personally reviewed  following labs and reports of the imaging studies  CBC: No results for input(s): "WBC", "NEUTROABS", "HGB", "HCT", "MCV", "PLT" in the last 168 hours.   Basic Metabolic Panel: No results for input(s): "NA", "K", "CL", "CO2", "GLUCOSE", "BUN", "CREATININE", "CALCIUM", "MG", "PHOS" in the last 168 hours.   GFR: Estimated Creatinine Clearance: 72.9 mL/min (by C-G formula based on SCr of 0.76 mg/dL).  Liver Function Tests: No results for input(s): "AST", "ALT", "ALKPHOS", "BILITOT", "PROT", "ALBUMIN" in the last 168 hours.    CBG: Recent Labs  Lab 12/12/21 0606 12/12/21 1201 12/12/21 1623 12/12/21 2131 12/13/21 0617  GLUCAP 110* 111* 125* 125* 125*       No results found for this or any previous visit (from the past 240 hour(s)).     Radiology Studies: No results found.     LOS: 25 days   Maicee Ullman 14/06/23 on [email or web address]  12/13/2021, 10:38 AM

## 2021-12-14 DIAGNOSIS — I10 Essential (primary) hypertension: Secondary | ICD-10-CM | POA: Diagnosis not present

## 2021-12-14 DIAGNOSIS — R1312 Dysphagia, oropharyngeal phase: Secondary | ICD-10-CM | POA: Diagnosis not present

## 2021-12-14 LAB — GLUCOSE, CAPILLARY
Glucose-Capillary: 118 mg/dL — ABNORMAL HIGH (ref 70–99)
Glucose-Capillary: 127 mg/dL — ABNORMAL HIGH (ref 70–99)
Glucose-Capillary: 132 mg/dL — ABNORMAL HIGH (ref 70–99)

## 2021-12-14 LAB — CBC
HCT: 35.2 % — ABNORMAL LOW (ref 39.0–52.0)
Hemoglobin: 12.7 g/dL — ABNORMAL LOW (ref 13.0–17.0)
MCH: 36.4 pg — ABNORMAL HIGH (ref 26.0–34.0)
MCHC: 36.1 g/dL — ABNORMAL HIGH (ref 30.0–36.0)
MCV: 100.9 fL — ABNORMAL HIGH (ref 80.0–100.0)
Platelets: 349 10*3/uL (ref 150–400)
RBC: 3.49 MIL/uL — ABNORMAL LOW (ref 4.22–5.81)
RDW: 12.4 % (ref 11.5–15.5)
WBC: 12.1 10*3/uL — ABNORMAL HIGH (ref 4.0–10.5)
nRBC: 0 % (ref 0.0–0.2)

## 2021-12-14 LAB — COMPREHENSIVE METABOLIC PANEL
ALT: 51 U/L — ABNORMAL HIGH (ref 0–44)
AST: 39 U/L (ref 15–41)
Albumin: 3 g/dL — ABNORMAL LOW (ref 3.5–5.0)
Alkaline Phosphatase: 145 U/L — ABNORMAL HIGH (ref 38–126)
Anion gap: 10 (ref 5–15)
BUN: 13 mg/dL (ref 8–23)
CO2: 20 mmol/L — ABNORMAL LOW (ref 22–32)
Calcium: 9.4 mg/dL (ref 8.9–10.3)
Chloride: 105 mmol/L (ref 98–111)
Creatinine, Ser: 0.76 mg/dL (ref 0.61–1.24)
GFR, Estimated: >60 mL/min (ref >60)
Glucose, Bld: 105 mg/dL — ABNORMAL HIGH (ref 70–99)
Potassium: 3.7 mmol/L (ref 3.5–5.1)
Sodium: 135 mmol/L (ref 135–145)
Total Bilirubin: 0.5 mg/dL (ref 0.3–1.2)
Total Protein: 7.5 g/dL (ref 6.5–8.1)

## 2021-12-14 LAB — MAGNESIUM: Magnesium: 1.6 mg/dL — ABNORMAL LOW (ref 1.7–2.4)

## 2021-12-14 MED ORDER — MAGNESIUM OXIDE -MG SUPPLEMENT 400 (240 MG) MG PO TABS
400.0000 mg | ORAL_TABLET | Freq: Two times a day (BID) | ORAL | Status: DC
  Administered 2021-12-14 – 2021-12-16 (×5): 400 mg via ORAL
  Filled 2021-12-14 (×5): qty 1

## 2021-12-14 MED ORDER — MAGNESIUM SULFATE 4 GM/100ML IV SOLN
4.0000 g | Freq: Once | INTRAVENOUS | Status: AC
  Administered 2021-12-14: 4 g via INTRAVENOUS
  Filled 2021-12-14: qty 100

## 2021-12-14 NOTE — Progress Notes (Signed)
[facility] PROGRESS NOTE   Brent Warren [id number] DOB: [birth year] DOA: 11/18/2021  PCP: Marcine Matar, MD  Brief History/Interval Summary: 61 year old male with history of HTN, tobacco use, chronic alcohol use presented with sudden onset slurred speech and right-sided weakness.  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  He was admitted to the neuro ICU, remained stable and transferred to the hospitalist service on 11/13.  Patient also developed alcohol withdrawal during this hospitalization.  Consultants: Neurology.  Critical care medicine.   Subjective/Interval History: Denies any complaints.  Learning his diet.  Assessment/Plan:  Left basal ganglia ICH -Neurology was following and signed off on 11/24/2021 and recommended outpatient follow-up with neurology -Echo showed EF of 60 to 65%.  LDL 62, A1c is 5.1 -PT/OT recommending CIR.  As per CIR, patient is not a good candidate for CIR.   TOC following for SNF placement.   NG Feeding tube has been removed. Agitation seems to be better as well.    Oropharyngeal dysphagia Initially was kept n.p.o. and started on nasogastric tube feedings.   Seen by speech therapy and diet was advanced.   Has had good caloric intake.  NG tube was removed on 12/3. Continue to encourage oral intake.    Essential hypertension Blood pressure reasonably well-controlled.  Noted to be on amlodipine and metoprolol.     PSVT Telemetry showed brief episodes of supraventricular tachycardia on 11/23. Echocardiogram shows normal systolic function.   TSH noted to be 5.0 with a normal free T4 of 0.77. Patient was started on beta-blocker.  Telemetry showed improvement.  Subsequently taken off of telemetry.  Agitation Seems to be stable on Seroquel twice a day.  Now off of restraints.   Alcohol abuse, alcohol withdrawal with delirium tremens Patient was noted to have significant alcohol withdrawal symptoms.  PCCM consulted  and have signed off.  Completed phenobarbital taper.  Vitamin B1 normal Continue thiamine, folic acid and multivitamin   Hypokalemia/hyponatremia/hypomagnesemia Potassium level is normal today.  Magnesium noted to be low and will be supplemented.  Possible aspiration pneumonia/Possible UTI UA suggestive of UTI as well.  Cultures negative. Was started on Unasyn on 11/17.  Completed 5 days of treatment.   Respiratory status noted to be stable.  He is noted to be afebrile.  Vitamin B12 deficiency B12 177.  Folate 12.7.   Continue cyanocobalamine.     Macrocytic anemia Secondary to alcohol abuse as well as B12 deficiency. Ferritin noted to be 1074.  Iron 95.  TIBC 252.   Elevated LFTs AST and ALT are noted to be minimally elevated.  Possibly from alcohol use.  No recent imaging study of the hepatobiliary system noted in EMR.   Since levels are reasonably stable this can be pursued in the outpatient setting. Hepatitis panel is unremarkable.   Tobacco use, history of emphysema Continue nebs as needed Respiratory status is stable.  Severe protein calorie malnutrition Nutrition Problem: Severe Malnutrition Etiology: social / environmental circumstances Signs/Symptoms: severe fat depletion, severe muscle depletion  Abdominal discomfort on 11/27 Reason for this was not entirely clear.  He did have multiple bowel movements after which his symptoms subsided.  Bladder scan did show greater than 400 mL but then he was able to void on his own.    DVT Prophylaxis: Subcutaneous heparin Code Status: Full code Family Communication: No family at bedside Disposition Plan: SNF  Status is: Inpatient Remains inpatient appropriate because: Intracranial hemorrhage, dysphagia     Medications: Scheduled:  amLODipine  10 mg Per Tube Daily   ascorbic acid  500 mg Oral Daily   vitamin B-12  1,000 mcg Oral Daily   feeding supplement  237 mL Oral TID BM   folic acid  1 mg Oral Daily   heparin  injection (subcutaneous)  5,000 Units Subcutaneous Q8H   hydrocortisone cream   Topical BID   magnesium oxide  400 mg Oral BID   metoprolol tartrate  50 mg Per Tube BID   multivitamin with minerals  1 tablet Oral Daily   mouth rinse  15 mL Mouth Rinse 4 times per day   pantoprazole  40 mg Oral Daily   pyridOXINE  100 mg Oral Daily   QUEtiapine  50 mg Per Tube BID   sodium chloride flush  3 mL Intravenous Once   thiamine  100 mg Oral Daily   vitamin A  10,000 Units Oral Daily   zinc sulfate  220 mg Oral Daily   Continuous:  magnesium sulfate bolus IVPB 4 g (12/14/21 0937)    RCV:[id number] **OR** acetaminophen (TYLENOL) oral liquid 160 mg/5 mL **OR** acetaminophen, ipratropium-albuterol, labetalol, loperamide, LORazepam, mouth rinse  Antibiotics: Anti-infectives (From admission, onward)    Start     Dose/Rate Route Frequency Ordered Stop   11/24/21 1530  Ampicillin-Sulbactam (UNASYN) 3 g in sodium chloride 0.9 % 100 mL IVPB        3 g 200 mL/hr over 30 Minutes Intravenous Every 6 hours 11/24/21 1437 11/28/21 2110       Objective:  Vital Signs  Vitals:   12/13/21 2346 12/14/21 0341 12/14/21 0536 12/14/21 0745  BP: 117/78 118/85  124/82  Pulse: (!) 109 95  91  Resp: 16 16  17   Temp: 98.9 F (37.2 C) 98.1 F (36.7 C)  98.7 F (37.1 C)  TempSrc:    Oral  SpO2: 98% 98%  98%  Weight:   49.9 kg   Height:        Intake/Output Summary (Last 24 hours) at 12/14/2021 1101 Last data filed at 12/14/2021 0900 Gross per 24 hour  Intake 340 ml  Output 250 ml  Net 90 ml    Filed Weights   12/11/21 0425 12/12/21 0443 12/14/21 0536  Weight: 56.1 kg 53.8 kg 49.9 kg    General appearance: Awake alert.  In no distress Resp: Clear to auscultation bilaterally.  Normal effort Cardio: S1-S2 is normal regular.  No S3-S4.  No rubs murmurs or bruit GI: Abdomen is soft.  Nontender nondistended.  Bowel sounds are present normal.  No masses organomegaly Extremities: No edema.      Lab Results:  Data Reviewed: I have personally reviewed following labs and reports of the imaging studies  CBC: Recent Labs  Lab 12/14/21 0602  WBC 12.1*  HGB 12.7*  HCT 35.2*  MCV 100.9*  PLT 349     Basic Metabolic Panel: Recent Labs  Lab 12/14/21 0602  NA 135  K 3.7  CL 105  CO2 20*  GLUCOSE 105*  BUN 13  CREATININE 0.76  CALCIUM 9.4  MG 1.6*     GFR: Estimated Creatinine Clearance: 67.6 mL/min (by C-G formula based on SCr of 0.76 mg/dL).  Liver Function Tests: Recent Labs  Lab 12/14/21 0602  AST 39  ALT 51*  ALKPHOS 145*  BILITOT 0.5  PROT 7.5  ALBUMIN 3.0*      CBG: Recent Labs  Lab 12/12/21 2131 12/13/21 0617 12/13/21 1614 12/13/21 2111 12/14/21 0611  GLUCAP 125* 125*  116* 100* 118*       No results found for this or any previous visit (from the past 240 hour(s)).     Radiology Studies: No results found.     LOS: 26 days   Kionna Brier [employer] on [email or web address]  12/14/2021, 11:01 AM

## 2021-12-14 NOTE — Plan of Care (Signed)
  Problem: Education: Goal: Knowledge of disease or condition will improve Outcome: Progressing Goal: Knowledge of secondary prevention will improve (MUST DOCUMENT ALL) Outcome: Progressing Goal: Knowledge of patient specific risk factors will improve Elta [place] N/A or DELETE if not current risk factor) Outcome: Progressing   Problem: Coping: Goal: Will verbalize positive feelings about self Outcome: Progressing Goal: Will identify appropriate support needs Outcome: Progressing   Problem: Health Behavior/Discharge Planning: Goal: Ability to manage health-related needs will improve Outcome: Progressing Goal: Goals will be collaboratively established with patient/family Outcome: Progressing   Problem: Self-Care: Goal: Ability to participate in self-care as condition permits will improve Outcome: Progressing Goal: Verbalization of feelings and concerns over difficulty with self-care will improve Outcome: Progressing Goal: Ability to communicate needs accurately will improve Outcome: Progressing   Problem: Nutrition: Goal: Risk of aspiration will decrease Outcome: Progressing Goal: Dietary intake will improve Outcome: Progressing   Problem: Education: Goal: Knowledge of General Education information will improve Description: Including pain rating scale, medication(s)/side effects and non-pharmacologic comfort measures Outcome: Progressing   Problem: Health Behavior/Discharge Planning: Goal: Ability to manage health-related needs will improve Outcome: Progressing   Problem: Clinical Measurements: Goal: Will remain free from infection Outcome: Progressing Goal: Diagnostic test results will improve Outcome: Progressing Goal: Respiratory complications will improve Outcome: Progressing Goal: Cardiovascular complication will be avoided Outcome: Progressing   Problem: Activity: Goal: Risk for activity intolerance will decrease Outcome: Progressing   Problem: Safety: Goal:  Ability to remain free from injury will improve Outcome: Progressing   Problem: Skin Integrity: Goal: Risk for impaired skin integrity will decrease Outcome: Progressing   Problem: Education: Goal: Ability to describe self-care measures that may prevent or decrease complications (Diabetes Survival Skills Education) will improve Outcome: Progressing Goal: Individualized Educational Video(s) Outcome: Progressing   Problem: Fluid Volume: Goal: Ability to maintain a balanced intake and output will improve Outcome: Progressing   Problem: Health Behavior/Discharge Planning: Goal: Ability to identify and utilize available resources and services will improve Outcome: Progressing Goal: Ability to manage health-related needs will improve Outcome: Progressing   Problem: Metabolic: Goal: Ability to maintain appropriate glucose levels will improve Outcome: Progressing   Problem: Nutritional: Goal: Progress toward achieving an optimal weight will improve Outcome: Progressing   Problem: Skin Integrity: Goal: Risk for impaired skin integrity will decrease Outcome: Progressing   Problem: Nutrition: Goal: Adequate nutrition will be maintained Outcome: Not Progressing   Problem: Nutritional: Goal: Maintenance of adequate nutrition will improve Outcome: Not Progressing   Problem: Clinical Measurements: Goal: Ability to maintain clinical measurements within normal limits will improve Outcome: Completed/Met   Problem: Elimination: Goal: Will not experience complications related to bowel motility Outcome: Completed/Met   Problem: Pain Managment: Goal: General experience of comfort will improve Outcome: Completed/Met

## 2021-12-14 NOTE — Progress Notes (Signed)
Speech Language Pathology Treatment: Cognitive-Linquistic  Patient Details Name: Brent Warren MRN: [id number] DOB: 1961/01/06 Today's Date: 12/14/2021 Time: 0900-0920 SLP Time Calculation (min) (ACUTE ONLY): 20 min  Assessment / Plan / Recommendation Clinical Impression  Pt demonstrates significant improvement in intelligibility and PO management since prior sessions with this therapist. Pt is still moderately unintelligible due to decreased coordination, deletion of some nonessential language elements , decreased awareness and baseline speech pattern. In conversation SLP used casual repetition of pts statements to clarify intelligibility breakdowns without direct intervention. Pt increasingly demonstrated improved articulation and extension of language after hearing SLP repetition of his own utterances with elaboration.  Pt able to verbalize intellectual awareness of his stroke, its effect on his arm and his d/c disposition. He does not necessarily carry this over to safety precautions to compensate and this will be the ongoing area to target in cognitive rehabilitation.  Pt also verbalizes that he is tired of getting the same food each day. SLP offered pt regular solids (an oreo) which he was able to masticate despite poor positioning, which he did eventually self correct somewhat when drinking thin liquids. Pt is safe to upgrade to regular diet and thin liquids to increase QOL and nutrition access. Will f/u for tolerance though pt is nearing the end of his needs for dysphagia.   HPI HPI: 61 year old male with history of HTN, tobacco use, EtOH use who comes into the hospital with sudden onset slurred speech and right-sided weakness.  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   He started to develop increased withdrawal symptoms on 11/13      SLP Plan  Continue with current plan of care      Recommendations for follow up therapy are one component of a multi-disciplinary discharge  planning process, led by the attending physician.  Recommendations may be updated based on patient status, additional functional criteria and insurance authorization.    Recommendations  Diet recommendations: Regular;Thin liquid Liquids provided via: Cup;Straw Medication Administration: Whole meds with puree Supervision: Full supervision/cueing for compensatory strategies;Staff to assist with self feeding Compensations: Small sips/bites;Slow rate;Minimize environmental distractions Postural Changes and/or Swallow Maneuvers: Seated upright 90 degrees;Out of bed for meals                Follow Up Recommendations: Skilled nursing-short term rehab (<3 hours/day) Assistance recommended at discharge: Frequent or constant Supervision/Assistance SLP Visit Diagnosis: Dysphagia, unspecified (R13.10) Plan: Continue with current plan of care           Kendall Arnell, Riley Nearing  12/14/2021, 9:54 AM

## 2021-12-14 NOTE — Progress Notes (Signed)
Mobility Specialist: Progress Note   12/14/21 1503  Mobility  Activity Ambulated with assistance in room  Level of Assistance Minimal assist, patient does 75% or more  Assistive Device Front wheel walker  Distance Ambulated (ft) 30 ft  Activity Response Tolerated well  Mobility Referral Yes  $Mobility charge 1 Mobility   Pt received in the bed and agreeable to mobility. ModA with bed mobility and minA to stand. Verbal cues and physical assist for hand placement and RW direction. No c/o throughout. Pt back to bed after session with call bell and phone in reach. Bed alarm is on.   [name] Mobility Specialist Please contact via SecureChat or Rehab office at [phone]

## 2021-12-14 NOTE — Progress Notes (Signed)
Nutrition Follow-up  DOCUMENTATION CODES:  Severe malnutrition in context of social or environmental circumstances  INTERVENTION:  Continue current diet as ordered per SLP Feeding assistance and automatic trays Ensure Enlive 3x/d 350kcal and 20g protein 10,000IU of vitamin A x 2 weeks, 500 mg Vitamin C x 30 days, 220mg  zinc x 14d, 100mg  B6 x 2 weeks  NUTRITION DIAGNOSIS:  Severe Malnutrition related to social / environmental circumstances as evidenced by severe fat depletion, severe muscle depletion. - remains applicable  GOAL:  Patient will meet greater than or equal to 90% of their needs - progressing, diet and supplements in place  MONITOR:  Diet advancement, TF tolerance, Labs  REASON FOR ASSESSMENT:  Consult Calorie Count, Enteral/tube feeding initiation and management  ASSESSMENT:  61 y.o. male with PMH HTN, alcohol abuse, and COPD who presented after ICH.  11/13 SLP rec'd NPO 11/15 Cortrak placed, starting tube feeds 11/21 - MBS, DYS2, thins 11/24 - TF adjusted to nocturnal 11/27-11/29 - kcal count, very poor intake, not meeting needs 11/30 - adjust TF back to meet 100% of needs, bolus feeds requested by MD 12/3 - cortrak tube removed 12/7 - SLP upgraded to regular diet with thin liquids  Pt had tube removed 12/3. Intake recorded in flowsheet is poor, but routinely consuming supplements. Pt was underweight prior to admission, so this is likely pt's baseline. Diet upgrade today may help with poor PO.  Pt's mental status improving, no longer requiring restraints. Awaiting placement for discharge.  Average Meal Intake: 11/21-11/27: 37% average intake x 3 recorded meals 11/28-12/7: 36% average intake x 12 recorded meals  Nutritionally Relevant Medications: Scheduled Meds:  ascorbic acid  500 mg Oral Daily   vitamin B-12  1,000 mcg Oral Daily   feeding supplement  237 mL Oral TID BM   folic acid  1 mg Oral Daily   magnesium oxide  400 mg Oral BID   multivitamin  with minerals  1 tablet Oral Daily   pantoprazole  40 mg Oral Daily   pyridOXINE  100 mg Oral Daily   thiamine  100 mg Oral Daily   vitamin A  10,000 Units Oral Daily   zinc sulfate  220 mg Oral Daily   Labs Reviewed  NUTRITION - FOCUSED PHYSICAL EXAM: Flowsheet Row Most Recent Value  Orbital Region Severe depletion  Upper Arm Region Severe depletion  Thoracic and Lumbar Region Severe depletion  Buccal Region Moderate depletion  Temple Region Severe depletion  Clavicle Bone Region Severe depletion  Clavicle and Acromion Bone Region Severe depletion  Scapular Bone Region Unable to assess  Dorsal Hand Severe depletion  Patellar Region Severe depletion  Anterior Thigh Region Severe depletion  Posterior Calf Region Severe depletion  Edema (RD Assessment) None  Hair Reviewed  Eyes Reviewed  [Bitot's Spot]  Mouth Reviewed  [red, smooth]  Skin Reviewed  [seborrheic dermatitis, follicular hyperkeratosis]  Nails Reviewed    Diet Order:   Diet Order             Diet regular Room service appropriate? No; Fluid consistency: Thin  Diet effective now                   EDUCATION NEEDS:  Not appropriate for education at this time  Skin:  Skin Assessment: Reviewed RN Assessment  Last BM:  12/5  Height:  Ht Readings from Last 1 Encounters:  11/22/21 5\' 9"  (1.753 m)    Weight:  Wt Readings from Last 1 Encounters:  12/14/21 49.9  kg    Ideal Body Weight:  72.7 kg  BMI:  Body mass index is 16.25 kg/m.  Estimated Nutritional Needs:  Kcal:  1900-2100 kcal/d Protein:  90-110 g/d Fluid:  1.9-2.1 L/d    Greig Castilla, RD, LDN Clinical Dietitian RD pager # available in [facility]  After hours/weekend pager # available in [facility]

## 2021-12-15 DIAGNOSIS — I610 Nontraumatic intracerebral hemorrhage in hemisphere, subcortical: Secondary | ICD-10-CM | POA: Diagnosis not present

## 2021-12-15 DIAGNOSIS — I10 Essential (primary) hypertension: Secondary | ICD-10-CM | POA: Diagnosis not present

## 2021-12-15 LAB — GLUCOSE, CAPILLARY
Glucose-Capillary: 118 mg/dL — ABNORMAL HIGH (ref 70–99)
Glucose-Capillary: 119 mg/dL — ABNORMAL HIGH (ref 70–99)
Glucose-Capillary: 113 mg/dL — ABNORMAL HIGH (ref 70–99)

## 2021-12-15 MED ORDER — AMLODIPINE BESYLATE 5 MG PO TABS
5.0000 mg | ORAL_TABLET | Freq: Every day | ORAL | Status: DC
  Administered 2021-12-16 – 2021-12-17 (×2): 5 mg via ORAL
  Filled 2021-12-15 (×3): qty 1

## 2021-12-15 MED ORDER — QUETIAPINE FUMARATE 25 MG PO TABS
50.0000 mg | ORAL_TABLET | Freq: Two times a day (BID) | ORAL | Status: DC
  Administered 2021-12-15 – 2021-12-28 (×26): 50 mg via ORAL
  Filled 2021-12-15 (×2): qty 1
  Filled 2021-12-15 (×4): qty 2
  Filled 2021-12-15: qty 1
  Filled 2021-12-15 (×3): qty 2
  Filled 2021-12-15 (×2): qty 1
  Filled 2021-12-15 (×4): qty 2
  Filled 2021-12-15: qty 1
  Filled 2021-12-15: qty 2
  Filled 2021-12-15 (×3): qty 1
  Filled 2021-12-15: qty 2
  Filled 2021-12-15: qty 1
  Filled 2021-12-15: qty 2
  Filled 2021-12-15: qty 1
  Filled 2021-12-15: qty 2

## 2021-12-15 MED ORDER — METOPROLOL TARTRATE 50 MG PO TABS
50.0000 mg | ORAL_TABLET | Freq: Two times a day (BID) | ORAL | Status: DC
  Administered 2021-12-15 – 2021-12-28 (×25): 50 mg via ORAL
  Filled 2021-12-15 (×26): qty 1

## 2021-12-15 NOTE — Progress Notes (Signed)
Occupational Therapy Treatment Patient Details Name: Brent Warren MRN: [id number] DOB: [birth year] Today's Date: 12/15/2021   History of present illness 61 yo male presenting 11/11 with R-sided weakness, facial droop and slurred speech. 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 PMH includes: HTN, COPD, and alcohol abuse.   OT comments  Patient with limited progress on this date due to refusal to get OOB.  Address hand hygiene and AAROM to RUE with patient able to demonstrate increased RUE elbow flexion and extension with gravity eliminated. Patient would benefit from continued OT treatment to increase functional use of RUE and increase independence and safety with self care tasks.    Recommendations for follow up therapy are one component of a multi-disciplinary discharge planning process, led by the attending physician.  Recommendations may be updated based on patient status, additional functional criteria and insurance authorization.    Follow Up Recommendations  Acute inpatient rehab (3hours/day)     Assistance Recommended at Discharge Frequent or constant Supervision/Assistance  Patient can return home with the following  Two people to help with walking and/or transfers;Two people to help with bathing/dressing/bathroom;Direct supervision/assist for medications management;Direct supervision/assist for financial management;Assistance with feeding;Assistance with cooking/housework;Assist for transportation;Help with stairs or ramp for entrance   Equipment Recommendations  BSC/3in1;Wheelchair (measurements OT);Wheelchair cushion (measurements OT)    Recommendations for Other Services      Precautions / Restrictions Precautions Precautions: Fall Precaution Comments: watch HR Restrictions Weight Bearing Restrictions: No       Mobility Bed Mobility Overal bed mobility: Needs Assistance             General bed mobility comments: patient seen at bed level due to refusal to get  OOB    Transfers                   General transfer comment: patient declined     Balance                                           ADL either performed or assessed with clinical judgement   ADL Overall ADL's : Needs assistance/impaired Eating/Feeding: Set up;Bed level   Grooming: Wash/dry hands;Minimal assistance Grooming Details (indicate cue type and reason): performed hand washing to RUE due to dry, flaking skin                                    Extremity/Trunk Assessment Upper Extremity Assessment RUE Deficits / Details: increased flexor tone. Pt able to actively flex R digits with 2/+5 grasp. Able to perform PROM elbow extension to ~20 degrees, but unable to fully extend RUE Sensation: decreased light touch RUE Coordination: decreased fine motor;decreased gross motor LUE Deficits / Details: [facility]            Vision       Perception     Praxis      Cognition Arousal/Alertness: Awake/alert Behavior During Therapy: Flat affect Overall Cognitive Status: Difficult to assess Area of Impairment: Following commands, Orientation, Attention, Problem solving, Awareness, Safety/judgement                   Current Attention Level: Focused, Sustained   Following Commands: Follows one step commands with increased time Safety/Judgement: Decreased awareness of safety, Decreased awareness of deficits Awareness: Intellectual Problem Solving:  Slow processing General Comments: declined getting OOB stating that his medication makes him dizzy when he gets up        Exercises Exercises: General Upper Extremity General Exercises - Upper Extremity Shoulder Flexion: AAROM, Right, 10 reps Shoulder ABduction: PROM, Right, 10 reps Elbow Flexion: AAROM, 5 reps, Supine, Right, Other (comment) (gravity elimanted) Elbow Extension: AAROM, Right, 10 reps, Supine (with gravity eliminated)    Shoulder Instructions       General  Comments      Pertinent Vitals/ Pain       Pain Assessment Pain Assessment: Faces Faces Pain Scale: No hurt Pain Intervention(s): Monitored during session  Home Living                                          Prior Functioning/Environment              Frequency  Min 2X/week        Progress Toward Goals  OT Goals(current goals can now be found in the care plan section)  Progress towards OT goals: Progressing toward goals  Acute Rehab OT Goals Patient Stated Goal: get better OT Goal Formulation: With patient Time For Goal Achievement: 12/18/21 Potential to Achieve Goals: Fair ADL Goals Pt Will Perform Grooming: with min assist;sitting Pt Will Perform Upper Body Bathing: with min guard assist;sitting Pt Will Transfer to Toilet: with +2 assist;with min assist;stand pivot transfer;bedside commode Pt/caregiver will Perform Home Exercise Program: Increased ROM;Right Upper extremity;With minimal assist Additional ADL Goal #1: pt will complete basic transfer +2 min (A) as precursor to adls  Plan Discharge plan remains appropriate;Frequency remains appropriate    Co-evaluation                 AM-PAC OT "6 Clicks" Daily Activity     Outcome Measure   Help from another person eating meals?: A Little Help from another person taking care of personal grooming?: A Little Help from another person toileting, which includes using toliet, bedpan, or urinal?: A Lot Help from another person bathing (including washing, rinsing, drying)?: Total Help from another person to put on and taking off regular upper body clothing?: A Lot Help from another person to put on and taking off regular lower body clothing?: A Lot 6 Click Score: 13    End of Session    OT Visit Diagnosis: Unsteadiness on feet (R26.81);Muscle weakness (generalized) (M62.81);Hemiplegia and hemiparesis;Low vision, both eyes (H54.2);Other symptoms and signs involving cognitive function;Other  symptoms and signs involving the nervous system (R29.898) Hemiplegia - Right/Left: Right Hemiplegia - dominant/non-dominant: Dominant Hemiplegia - caused by: Other Nontraumatic intracranial hemorrhage   Activity Tolerance Patient tolerated treatment well   Patient Left in bed;with call bell/phone within reach;with bed alarm set   Nurse Communication Mobility status        Time: 7262-0355 OT Time Calculation (min): 16 min  Charges: OT General Charges $OT Visit: 1 Visit OT Treatments $Neuromuscular Re-education: 8-22 mins  Alfonse Flavors, OTA Acute Rehabilitation Services  Office [phone]   Dewain Penning 12/15/2021, 2:37 PM

## 2021-12-15 NOTE — Plan of Care (Signed)
  Problem: Education: Goal: Knowledge of disease or condition will improve Outcome: Progressing Goal: Knowledge of secondary prevention will improve (MUST DOCUMENT ALL) Outcome: Progressing Goal: Knowledge of patient specific risk factors will improve (Mark N/A or DELETE if not current risk factor) Outcome: Progressing   

## 2021-12-15 NOTE — Plan of Care (Signed)
  Problem: Education: Goal: Knowledge of disease or condition will improve Outcome: Progressing Goal: Knowledge of secondary prevention will improve (MUST DOCUMENT ALL) Outcome: Progressing Goal: Knowledge of patient specific risk factors will improve Loraine Leriche N/A or DELETE if not current risk factor) Outcome: Progressing   Problem: Coping: Goal: Will verbalize positive feelings about self Outcome: Progressing Goal: Will identify appropriate support needs Outcome: Progressing   Problem: Health Behavior/Discharge Planning: Goal: Ability to manage health-related needs will improve Outcome: Progressing Goal: Goals will be collaboratively established with patient/family Outcome: Progressing   Problem: Self-Care: Goal: Ability to participate in self-care as condition permits will improve Outcome: Progressing Goal: Verbalization of feelings and concerns over difficulty with self-care will improve Outcome: Progressing Goal: Ability to communicate needs accurately will improve Outcome: Progressing   Problem: Nutrition: Goal: Risk of aspiration will decrease Outcome: Progressing Goal: Dietary intake will improve Outcome: Progressing   Problem: Education: Goal: Knowledge of General Education information will improve Description: Including pain rating scale, medication(s)/side effects and non-pharmacologic comfort measures Outcome: Progressing   Problem: Health Behavior/Discharge Planning: Goal: Ability to manage health-related needs will improve Outcome: Progressing   Problem: Nutrition: Goal: Adequate nutrition will be maintained Outcome: Progressing   Problem: Education: Goal: Ability to describe self-care measures that may prevent or decrease complications (Diabetes Survival Skills Education) will improve Outcome: Progressing Goal: Individualized Educational Video(s) Outcome: Progressing   Problem: Health Behavior/Discharge Planning: Goal: Ability to identify and utilize  available resources and services will improve Outcome: Progressing Goal: Ability to manage health-related needs will improve Outcome: Progressing   Problem: Metabolic: Goal: Ability to maintain appropriate glucose levels will improve Outcome: Progressing   Problem: Skin Integrity: Goal: Risk for impaired skin integrity will decrease Outcome: Progressing

## 2021-12-15 NOTE — Progress Notes (Signed)
PT Cancellation Note  Patient Details Name: Brent Warren MRN: [id number] DOB: 1960-04-16   Cancelled Treatment:    Reason Eval/Treat Not Completed: (P) Patient declined, no reason specified (pt declining all mobility despite max encouragement and education) Will check back to continue with PT POC.  Lenora Boys. PTA Acute Rehabilitation Services Office: [phone]    Catalina Antigua 12/15/2021, 1:54 PM

## 2021-12-15 NOTE — Plan of Care (Signed)
  Problem: Education: Goal: Knowledge of disease or condition will improve Outcome: Progressing Goal: Knowledge of secondary prevention will improve (MUST DOCUMENT ALL) Outcome: Progressing Goal: Knowledge of patient specific risk factors will improve Loraine Leriche N/A or DELETE if not current risk factor) Outcome: Progressing   Problem: Coping: Goal: Will verbalize positive feelings about self Outcome: Progressing Goal: Will identify appropriate support needs Outcome: Progressing   Problem: Health Behavior/Discharge Planning: Goal: Ability to manage health-related needs will improve Outcome: Progressing Goal: Goals will be collaboratively established with patient/family Outcome: Progressing   Problem: Self-Care: Goal: Ability to participate in self-care as condition permits will improve Outcome: Progressing Goal: Verbalization of feelings and concerns over difficulty with self-care will improve Outcome: Progressing Goal: Ability to communicate needs accurately will improve Outcome: Progressing   Problem: Nutrition: Goal: Risk of aspiration will decrease Outcome: Progressing Goal: Dietary intake will improve Outcome: Progressing   Problem: Education: Goal: Knowledge of General Education information will improve Description: Including pain rating scale, medication(s)/side effects and non-pharmacologic comfort measures Outcome: Progressing   Problem: Health Behavior/Discharge Planning: Goal: Ability to manage health-related needs will improve Outcome: Progressing   Problem: Clinical Measurements: Goal: Will remain free from infection Outcome: Progressing Goal: Diagnostic test results will improve Outcome: Progressing Goal: Respiratory complications will improve Outcome: Progressing Goal: Cardiovascular complication will be avoided Outcome: Progressing   Problem: Activity: Goal: Risk for activity intolerance will decrease Outcome: Progressing   Problem: Nutrition: Goal:  Adequate nutrition will be maintained Outcome: Progressing   Problem: Safety: Goal: Ability to remain free from injury will improve Outcome: Progressing   Problem: Skin Integrity: Goal: Risk for impaired skin integrity will decrease Outcome: Progressing   Problem: Education: Goal: Ability to describe self-care measures that may prevent or decrease complications (Diabetes Survival Skills Education) will improve Outcome: Progressing Goal: Individualized Educational Video(s) Outcome: Progressing   Problem: Fluid Volume: Goal: Ability to maintain a balanced intake and output will improve Outcome: Progressing   Problem: Health Behavior/Discharge Planning: Goal: Ability to identify and utilize available resources and services will improve Outcome: Progressing Goal: Ability to manage health-related needs will improve Outcome: Progressing   Problem: Metabolic: Goal: Ability to maintain appropriate glucose levels will improve Outcome: Progressing   Problem: Nutritional: Goal: Maintenance of adequate nutrition will improve Outcome: Progressing Goal: Progress toward achieving an optimal weight will improve Outcome: Progressing   Problem: Skin Integrity: Goal: Risk for impaired skin integrity will decrease Outcome: Progressing

## 2021-12-15 NOTE — Progress Notes (Addendum)
[facility] PROGRESS NOTE   Brent Warren [id number] DOB: Aug 31, 1960 DOA: 11/18/2021  PCP: Ladell Pier, MD  Brief History/Interval Summary: 61 year old male with history of HTN, tobacco use, chronic alcohol use presented with sudden onset slurred speech and right-sided weakness.  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  He was admitted to the neuro ICU, remained stable and transferred to the hospitalist service on 11/13.  Patient also developed alcohol withdrawal during this hospitalization.  Consultants: Neurology.  Critical care medicine.   Subjective/Interval History: No complaints offered.  Tolerating his diet.  Assessment/Plan:  Left basal ganglia ICH -Neurology was following and signed off on 11/24/2021 and recommended outpatient follow-up with neurology -Echo showed EF of 60 to 65%.  LDL 62, A1c is 5.1 -PT/OT recommending CIR.  As per CIR, patient is not a good candidate for CIR.   TOC following for SNF placement.   NG Feeding tube has been removed. Agitation seems to be better as well.    Oropharyngeal dysphagia Initially was kept n.p.o. and started on nasogastric tube feedings.   Seen by speech therapy and diet was advanced.   Has had good caloric intake.  NG tube was removed on 12/3. Tolerating his diet.  Continue to encourage oral intake.  Essential hypertension Noted to be on amlodipine and metoprolol.  Blood pressure has been soft over the past 48 hours.  Will decrease the dose of amlodipine.  Continue metoprolol at current dose.    PSVT Telemetry showed brief episodes of supraventricular tachycardia on 11/23. Echocardiogram shows normal systolic function.   TSH noted to be 5.0 with a normal free T4 of 0.77. Patient was started on beta-blocker.  Telemetry showed improvement.  Subsequently taken off of telemetry.  Agitation Seems to be stable on Seroquel twice a day.  Now off of restraints.   Alcohol abuse, alcohol withdrawal  with delirium tremens Patient was noted to have significant alcohol withdrawal symptoms.  PCCM consulted and have signed off.  Completed phenobarbital taper.  Vitamin B1 normal Continue thiamine, folic acid and multivitamin   Hypokalemia/hyponatremia/hypomagnesemia Monitor electrolytes periodically.  On magnesium oxide.  Recheck labs tomorrow.    Possible aspiration pneumonia/Possible UTI UA suggestive of UTI as well.  Cultures negative. Completed 5 days of Unasyn.   Respiratory status noted to be stable.  He is noted to be afebrile.  Vitamin B12 deficiency B12 177.  Folate 12.7.   Continue cyanocobalamine.     Macrocytic anemia Secondary to alcohol abuse as well as B12 deficiency. Ferritin noted to be 1074.  Iron 95.  TIBC 252.   Elevated LFTs AST and ALT are noted to be minimally elevated.  Possibly from alcohol use.  No recent imaging study of the hepatobiliary system noted in EMR.   Since levels are reasonably stable this can be pursued in the outpatient setting. Hepatitis panel is unremarkable.   Tobacco use, history of emphysema Continue nebs as needed Respiratory status is stable.  Severe protein calorie malnutrition Nutrition Problem: Severe Malnutrition Etiology: social / environmental circumstances Signs/Symptoms: severe fat depletion, severe muscle depletion  DVT Prophylaxis: Subcutaneous heparin Code Status: Full code Family Communication: No family at bedside Disposition Plan: SNF.  Informed by TOC that he does have a bed offer in [place].  Possible discharge over the next few days.  Status is: Inpatient Remains inpatient appropriate because: Intracranial hemorrhage, dysphagia     Medications: Scheduled:  amLODipine  10 mg Per Tube Daily   ascorbic acid  500 mg Oral Daily   vitamin B-12  1,000 mcg Oral Daily   feeding supplement  237 mL Oral TID BM   folic acid  1 mg Oral Daily   heparin injection (subcutaneous)  5,000 Units Subcutaneous Q8H    hydrocortisone cream   Topical BID   magnesium oxide  400 mg Oral BID   metoprolol tartrate  50 mg Per Tube BID   multivitamin with minerals  1 tablet Oral Daily   mouth rinse  15 mL Mouth Rinse 4 times per day   pantoprazole  40 mg Oral Daily   pyridOXINE  100 mg Oral Daily   QUEtiapine  50 mg Per Tube BID   sodium chloride flush  3 mL Intravenous Once   thiamine  100 mg Oral Daily   vitamin A  10,000 Units Oral Daily   zinc sulfate  220 mg Oral Daily   Continuous:    [id number] **OR** acetaminophen (TYLENOL) oral liquid 160 mg/5 mL **OR** acetaminophen, ipratropium-albuterol, labetalol, loperamide, LORazepam, mouth rinse  Antibiotics: Anti-infectives (From admission, onward)    Start     Dose/Rate Route Frequency Ordered Stop   11/24/21 1530  Ampicillin-Sulbactam (UNASYN) 3 g in sodium chloride 0.9 % 100 mL IVPB        3 g 200 mL/hr over 30 Minutes Intravenous Every 6 hours 11/24/21 1437 11/28/21 2110       Objective:  Vital Signs  Vitals:   12/14/21 2051 12/15/21 0000 12/15/21 0401 12/15/21 0822  BP: 119/68 110/73 120/74 117/66  Pulse: 87 92 72 76  Resp: 16 16 16 17   Temp: 98.6 F (37 C) 98.5 F (36.9 C) (!) 97.3 F (36.3 C) 98.1 F (36.7 C)  TempSrc: Oral Oral Oral Oral  SpO2: 98% 97% 97% 97%  Weight:      Height:        Intake/Output Summary (Last 24 hours) at 12/15/2021 1052 Last data filed at 12/15/2021 1000 Gross per 24 hour  Intake 1473.33 ml  Output 600 ml  Net 873.33 ml    Filed Weights   12/11/21 0425 12/12/21 0443 12/14/21 0536  Weight: 56.1 kg 53.8 kg 49.9 kg   Awake alert.  In no distress. Lungs are clear to auscultation bilaterally. S1-S2 is normal regular.   Lab Results:  Data Reviewed: I have personally reviewed following labs and reports of the imaging studies  CBC: Recent Labs  Lab 12/14/21 0602  WBC 12.1*  HGB 12.7*  HCT 35.2*  MCV 100.9*  PLT 349     Basic Metabolic Panel: Recent Labs  Lab 12/14/21 0602   NA 135  K 3.7  CL 105  CO2 20*  GLUCOSE 105*  BUN 13  CREATININE 0.76  CALCIUM 9.4  MG 1.6*     GFR: Estimated Creatinine Clearance: 67.6 mL/min (by C-G formula based on SCr of 0.76 mg/dL).  Liver Function Tests: Recent Labs  Lab 12/14/21 0602  AST 39  ALT 51*  ALKPHOS 145*  BILITOT 0.5  PROT 7.5  ALBUMIN 3.0*      CBG: Recent Labs  Lab 12/13/21 1614 12/13/21 2111 12/14/21 0611 12/14/21 1133 12/14/21 1613  GLUCAP 116* 100* 118* 127* 132*       No results found for this or any previous visit (from the past 240 hour(s)).     Radiology Studies: No results found.     LOS: 27 days   Doristine Shehan 14/07/23 on [email or web address]  12/15/2021, 10:52 AM

## 2021-12-15 NOTE — Progress Notes (Signed)
Mobility Specialist: Progress Note   12/15/21 1705  Mobility  Activity Refused mobility   Pt refused mobility, no reason specified. Will f/u as able.   Daleah Coulson Mobility Specialist Please contact via SecureChat or Rehab office at [phone]

## 2021-12-16 LAB — COMPREHENSIVE METABOLIC PANEL
ALT: 48 U/L — ABNORMAL HIGH (ref 0–44)
AST: 39 U/L (ref 15–41)
Albumin: 2.8 g/dL — ABNORMAL LOW (ref 3.5–5.0)
Alkaline Phosphatase: 147 U/L — ABNORMAL HIGH (ref 38–126)
Anion gap: 12 (ref 5–15)
BUN: 16 mg/dL (ref 8–23)
CO2: 21 mmol/L — ABNORMAL LOW (ref 22–32)
Calcium: 9.3 mg/dL (ref 8.9–10.3)
Chloride: 102 mmol/L (ref 98–111)
Creatinine, Ser: 0.75 mg/dL (ref 0.61–1.24)
GFR, Estimated: >60 mL/min (ref >60)
Glucose, Bld: 102 mg/dL — ABNORMAL HIGH (ref 70–99)
Potassium: 4 mmol/L (ref 3.5–5.1)
Sodium: 135 mmol/L (ref 135–145)
Total Bilirubin: 0.3 mg/dL (ref 0.3–1.2)
Total Protein: 6.8 g/dL (ref 6.5–8.1)

## 2021-12-16 LAB — GLUCOSE, CAPILLARY
Glucose-Capillary: 119 mg/dL — ABNORMAL HIGH (ref 70–99)
Glucose-Capillary: 132 mg/dL — ABNORMAL HIGH (ref 70–99)
Glucose-Capillary: 119 mg/dL — ABNORMAL HIGH (ref 70–99)
Glucose-Capillary: 134 mg/dL — ABNORMAL HIGH (ref 70–99)

## 2021-12-16 LAB — MAGNESIUM: Magnesium: 3.2 mg/dL — ABNORMAL HIGH (ref 1.7–2.4)

## 2021-12-16 MED ORDER — MAGNESIUM OXIDE -MG SUPPLEMENT 400 (240 MG) MG PO TABS
400.0000 mg | ORAL_TABLET | Freq: Every day | ORAL | Status: DC
  Administered 2021-12-17 – 2021-12-28 (×12): 400 mg via ORAL
  Filled 2021-12-16 (×12): qty 1

## 2021-12-16 MED ORDER — POLYETHYLENE GLYCOL 3350 17 G PO PACK
17.0000 g | PACK | Freq: Every day | ORAL | Status: DC
  Administered 2021-12-16 – 2021-12-25 (×10): 17 g via ORAL
  Filled 2021-12-16 (×11): qty 1

## 2021-12-16 MED ORDER — SENNOSIDES-DOCUSATE SODIUM 8.6-50 MG PO TABS
2.0000 | ORAL_TABLET | Freq: Two times a day (BID) | ORAL | Status: DC
  Administered 2021-12-16 – 2021-12-28 (×20): 2 via ORAL
  Filled 2021-12-16 (×23): qty 2

## 2021-12-16 NOTE — Plan of Care (Signed)
  Problem: Education: Goal: Knowledge of disease or condition will improve Outcome: Progressing Goal: Knowledge of secondary prevention will improve (MUST DOCUMENT ALL) Outcome: Progressing Goal: Knowledge of patient specific risk factors will improve (Mark N/A or DELETE if not current risk factor) Outcome: Progressing   Problem: Coping: Goal: Will verbalize positive feelings about self Outcome: Progressing Goal: Will identify appropriate support needs Outcome: Progressing   Problem: Health Behavior/Discharge Planning: Goal: Ability to manage health-related needs will improve Outcome: Progressing Goal: Goals will be collaboratively established with patient/family Outcome: Progressing   Problem: Self-Care: Goal: Ability to participate in self-care as condition permits will improve Outcome: Progressing Goal: Verbalization of feelings and concerns over difficulty with self-care will improve Outcome: Progressing Goal: Ability to communicate needs accurately will improve Outcome: Progressing   Problem: Nutrition: Goal: Risk of aspiration will decrease Outcome: Progressing Goal: Dietary intake will improve Outcome: Progressing   Problem: Education: Goal: Knowledge of General Education information will improve Description: Including pain rating scale, medication(s)/side effects and non-pharmacologic comfort measures Outcome: Progressing   Problem: Health Behavior/Discharge Planning: Goal: Ability to manage health-related needs will improve Outcome: Progressing   Problem: Clinical Measurements: Goal: Will remain free from infection Outcome: Progressing Goal: Diagnostic test results will improve Outcome: Progressing Goal: Respiratory complications will improve Outcome: Progressing Goal: Cardiovascular complication will be avoided Outcome: Progressing   Problem: Activity: Goal: Risk for activity intolerance will decrease Outcome: Progressing   Problem: Nutrition: Goal:  Adequate nutrition will be maintained Outcome: Progressing   Problem: Safety: Goal: Ability to remain free from injury will improve Outcome: Progressing   Problem: Skin Integrity: Goal: Risk for impaired skin integrity will decrease Outcome: Progressing   Problem: Education: Goal: Ability to describe self-care measures that may prevent or decrease complications (Diabetes Survival Skills Education) will improve Outcome: Progressing Goal: Individualized Educational Video(s) Outcome: Progressing   Problem: Fluid Volume: Goal: Ability to maintain a balanced intake and output will improve Outcome: Progressing   Problem: Health Behavior/Discharge Planning: Goal: Ability to identify and utilize available resources and services will improve Outcome: Progressing Goal: Ability to manage health-related needs will improve Outcome: Progressing   Problem: Metabolic: Goal: Ability to maintain appropriate glucose levels will improve Outcome: Progressing   Problem: Nutritional: Goal: Maintenance of adequate nutrition will improve Outcome: Progressing Goal: Progress toward achieving an optimal weight will improve Outcome: Progressing   Problem: Skin Integrity: Goal: Risk for impaired skin integrity will decrease Outcome: Progressing   

## 2021-12-16 NOTE — Progress Notes (Signed)
[facility] PROGRESS NOTE   Brent Warren PVV:[id number] DOB: 02-13-1960 DOA: 11/18/2021  PCP: Marcine Matar, MD  Brief History/Interval Summary: 62 year old male with history of HTN, tobacco use, chronic alcohol use presented with sudden onset slurred speech and right-sided weakness.  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  He was admitted to the neuro ICU, remained stable and transferred to the hospitalist service on 11/13.  Patient also developed alcohol withdrawal during this hospitalization.  Consultants: Neurology.  Critical care medicine.   Subjective/Interval History: No complaints offered.  Looking forward to getting out of the hospital in the next few days.  Assessment/Plan:  Left basal ganglia ICH -Neurology was following and signed off on 11/24/2021 and recommended outpatient follow-up with neurology Echo showed EF of 60 to 65%.  LDL 62, A1c is 5.1 PT/OT recommending CIR.  As per CIR, patient is not a good candidate for CIR.   TOC following for SNF placement.   NG Feeding tube has been removed. Agitation seems to be better as well.    Oropharyngeal dysphagia Initially was kept n.p.o. and started on nasogastric tube feedings.   Seen by speech therapy and diet was advanced.   Has had good caloric intake.  NG tube was removed on 12/3. Tolerating his diet.  Continue to encourage oral intake.  Essential hypertension Noted to be on amlodipine and metoprolol.  Blood pressure has been soft over the past 48 hours.  Dose of amlodipine was decreased.  Continue metoprolol at current dose.     PSVT Telemetry showed brief episodes of supraventricular tachycardia on 11/23. Echocardiogram shows normal systolic function.   TSH noted to be 5.0 with a normal free T4 of 0.77. Patient was started on beta-blocker.  Telemetry showed improvement.  Subsequently taken off of telemetry.  Agitation Seems to be stable on Seroquel twice a day.  Now off of  restraints.   Alcohol abuse, alcohol withdrawal with delirium tremens Patient was noted to have significant alcohol withdrawal symptoms.  PCCM consulted and have signed off.  Completed phenobarbital taper.  Vitamin B1 normal Continue thiamine, folic acid and multivitamin   Hypokalemia/hyponatremia/hypomagnesemia Monitor electrolytes periodically.  On magnesium oxide.  Potassium 4.0 today.  Magnesium 3.2.  Cut back on the dose of magnesium oxide.  Possible aspiration pneumonia/Possible UTI UA suggestive of UTI as well.  Cultures negative. Completed 5 days of Unasyn.   Respiratory status noted to be stable.  He is noted to be afebrile.  Vitamin B12 deficiency B12 177.  Folate 12.7.   Continue cyanocobalamine.     Macrocytic anemia Secondary to alcohol abuse as well as B12 deficiency. Ferritin noted to be 1074.  Iron 95.  TIBC 252.   Elevated LFTs AST and ALT are noted to be minimally elevated.  Possibly from alcohol use.  No recent imaging study of the hepatobiliary system noted in EMR.   Since levels are reasonably stable this can be pursued in the outpatient setting. Hepatitis panel is unremarkable.   Tobacco use, history of emphysema Continue nebs as needed Respiratory status is stable.  Severe protein calorie malnutrition Nutrition Problem: Severe Malnutrition Etiology: social / environmental circumstances Signs/Symptoms: severe fat depletion, severe muscle depletion  DVT Prophylaxis: Subcutaneous heparin Code Status: Full code Family Communication: No family at bedside Disposition Plan: SNF.  Informed by TOC that he does have a bed offer in [place].  Possible discharge over the next few days.  Status is: Inpatient Remains inpatient appropriate because: Intracranial hemorrhage,  dysphagia     Medications: Scheduled:  amLODipine  5 mg Oral Daily   ascorbic acid  500 mg Oral Daily   vitamin B-12  1,000 mcg Oral Daily   feeding supplement  237 mL Oral TID BM    folic acid  1 mg Oral Daily   heparin injection (subcutaneous)  5,000 Units Subcutaneous Q8H   hydrocortisone cream   Topical BID   magnesium oxide  400 mg Oral BID   metoprolol tartrate  50 mg Oral BID   multivitamin with minerals  1 tablet Oral Daily   mouth rinse  15 mL Mouth Rinse 4 times per day   pantoprazole  40 mg Oral Daily   polyethylene glycol  17 g Oral Daily   pyridOXINE  100 mg Oral Daily   QUEtiapine  50 mg Oral BID   senna-docusate  2 tablet Oral BID   sodium chloride flush  3 mL Intravenous Once   thiamine  100 mg Oral Daily   vitamin A  10,000 Units Oral Daily   zinc sulfate  220 mg Oral Daily   Continuous:    IOE:[id number] **OR** acetaminophen (TYLENOL) oral liquid 160 mg/5 mL **OR** acetaminophen, ipratropium-albuterol, labetalol, LORazepam, mouth rinse  Antibiotics: Anti-infectives (From admission, onward)    Start     Dose/Rate Route Frequency Ordered Stop   11/24/21 1530  Ampicillin-Sulbactam (UNASYN) 3 g in sodium chloride 0.9 % 100 mL IVPB        3 g 200 mL/hr over 30 Minutes Intravenous Every 6 hours 11/24/21 1437 11/28/21 2110       Objective:  Vital Signs  Vitals:   12/15/21 1537 12/15/21 2015 12/16/21 0530 12/16/21 0754  BP: 112/71 123/80 109/65 (!) 117/95  Pulse: 79 83 88 (!) 108  Resp: 17 18 18 20   Temp: 98.2 F (36.8 C) 98.7 F (37.1 C) 98.5 F (36.9 C) 98.5 F (36.9 C)  TempSrc: Oral Oral Oral Oral  SpO2: 96% 96% 99% 95%  Weight:      Height:        Intake/Output Summary (Last 24 hours) at 12/16/2021 1038 Last data filed at 12/15/2021 2000 Gross per 24 hour  Intake --  Output 575 ml  Net -575 ml    Filed Weights   12/11/21 0425 12/12/21 0443 12/14/21 0536  Weight: 56.1 kg 53.8 kg 49.9 kg   Patient is awake alert.  In no distress.  Lab Results:  Data Reviewed: I have personally reviewed following labs and reports of the imaging studies  CBC: Recent Labs  Lab 12/14/21 0602  WBC 12.1*  HGB 12.7*  HCT 35.2*   MCV 100.9*  PLT 349     Basic Metabolic Panel: Recent Labs  Lab 12/14/21 0602 12/16/21 0310  NA 135 135  K 3.7 4.0  CL 105 102  CO2 20* 21*  GLUCOSE 105* 102*  BUN 13 16  CREATININE 0.76 0.75  CALCIUM 9.4 9.3  MG 1.6* 3.2*     GFR: Estimated Creatinine Clearance: 67.6 mL/min (by C-G formula based on SCr of 0.75 mg/dL).  Liver Function Tests: Recent Labs  Lab 12/14/21 0602 12/16/21 0310  AST 39 39  ALT 51* 48*  ALKPHOS 145* 147*  BILITOT 0.5 0.3  PROT 7.5 6.8  ALBUMIN 3.0* 2.8*      CBG: Recent Labs  Lab 12/14/21 1133 12/14/21 1613 12/15/21 1159 12/15/21 1638 12/15/21 2147  GLUCAP 127* 132* 118* 119* 113*       No results found for  this or any previous visit (from the past 240 hour(s)).     Radiology Studies: No results found.     LOS: 28 days   Graziella Connery [employer] on [email or web address]  12/16/2021, 10:38 AM

## 2021-12-17 DIAGNOSIS — I610 Nontraumatic intracerebral hemorrhage in hemisphere, subcortical: Secondary | ICD-10-CM | POA: Diagnosis not present

## 2021-12-17 DIAGNOSIS — I10 Essential (primary) hypertension: Secondary | ICD-10-CM | POA: Diagnosis not present

## 2021-12-17 LAB — GLUCOSE, CAPILLARY
Glucose-Capillary: 139 mg/dL — ABNORMAL HIGH (ref 70–99)
Glucose-Capillary: 112 mg/dL — ABNORMAL HIGH (ref 70–99)
Glucose-Capillary: 107 mg/dL — ABNORMAL HIGH (ref 70–99)

## 2021-12-17 NOTE — Progress Notes (Signed)
[facility] PROGRESS NOTE   Brent Warren NOM:[id number] DOB: 1960/10/20 DOA: 11/18/2021  PCP: Marcine Matar, MD  Brief History/Interval Summary: 61 year old male with history of HTN, tobacco use, chronic alcohol use presented with sudden onset slurred speech and right-sided weakness.  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  He was admitted to the neuro ICU, remained stable and transferred to the hospitalist service on 11/13.  Patient also developed alcohol withdrawal during this hospitalization.  Consultants: Neurology.  Critical care medicine.   Subjective/Interval History: No complaint offered.  Assessment/Plan:  Left basal ganglia ICH Neurology was following and signed off on 11/24/2021 and recommended outpatient follow-up with neurology Echo showed EF of 60 to 65%.  LDL 62, A1c is 5.1 PT/OT recommending CIR.  As per CIR, patient is not a good candidate for CIR.   TOC following for SNF placement.    Oropharyngeal dysphagia Initially was kept n.p.o. and started on nasogastric tube feedings.   Seen by speech therapy and diet was advanced.   Has had good caloric intake.  NG tube was removed on 12/3. Tolerating his diet.  Continue to encourage oral intake.  Essential hypertension Initially was on amlodipine and metoprolol.  Blood pressures improved and then started trending low.  Amlodipine dose was decreased.  Blood pressure seems to be better controlled now.   Metoprolol being continued as well.      PSVT Telemetry showed brief episodes of supraventricular tachycardia on 11/23. Echocardiogram shows normal systolic function.   TSH noted to be 5.0 with a normal free T4 of 0.77. Patient was started on beta-blocker.  Telemetry showed improvement.  Subsequently taken off of telemetry.  Agitation Seems to be stable on Seroquel twice a day.  Has been off of restraints for several days now.   Alcohol abuse, alcohol withdrawal with delirium  tremens Patient was noted to have significant alcohol withdrawal symptoms.  PCCM consulted and have signed off.  Completed phenobarbital taper.  Vitamin B1 normal Continue thiamine, folic acid and multivitamin   Hypokalemia/hyponatremia/hypomagnesemia Monitor electrolytes periodically.  On magnesium oxide.  Dose of magnesium oxide was decreased yesterday due to a magnesium level of 3.2.  Possible aspiration pneumonia/Possible UTI UA suggestive of UTI as well.  Cultures negative. Completed 5 days of Unasyn.   Respiratory status noted to be stable.    Vitamin B12 deficiency B12 177.  Folate 12.7.   Continue cyanocobalamine.     Macrocytic anemia Secondary to alcohol abuse as well as B12 deficiency. Ferritin noted to be 1074.  Iron 95.  TIBC 252.   Elevated LFTs AST and ALT are noted to be minimally elevated.  Possibly from alcohol use.  No recent imaging study of the hepatobiliary system noted in EMR.   Since levels are reasonably stable this can be pursued in the outpatient setting. Hepatitis panel is unremarkable.   Tobacco use, history of emphysema Continue nebs as needed Respiratory status is stable.  Severe protein calorie malnutrition Nutrition Problem: Severe Malnutrition Etiology: social / environmental circumstances Signs/Symptoms: severe fat depletion, severe muscle depletion  DVT Prophylaxis: Subcutaneous heparin Code Status: Full code Family Communication: No family at bedside Disposition Plan: SNF.  Informed by TOC that he does have a bed offer in [place].  Possible discharge over the next few days.  Status is: Inpatient Remains inpatient appropriate because: Intracranial hemorrhage, dysphagia     Medications: Scheduled:  amLODipine  5 mg Oral Daily   ascorbic acid  500 mg Oral Daily  vitamin B-12  1,000 mcg Oral Daily   feeding supplement  237 mL Oral TID BM   folic acid  1 mg Oral Daily   heparin injection (subcutaneous)  5,000 Units Subcutaneous  Q8H   hydrocortisone cream   Topical BID   magnesium oxide  400 mg Oral Daily   metoprolol tartrate  50 mg Oral BID   multivitamin with minerals  1 tablet Oral Daily   mouth rinse  15 mL Mouth Rinse 4 times per day   pantoprazole  40 mg Oral Daily   polyethylene glycol  17 g Oral Daily   pyridOXINE  100 mg Oral Daily   QUEtiapine  50 mg Oral BID   senna-docusate  2 tablet Oral BID   sodium chloride flush  3 mL Intravenous Once   thiamine  100 mg Oral Daily   vitamin A  10,000 Units Oral Daily   zinc sulfate  220 mg Oral Daily   Continuous:    JOI:[id number] **OR** acetaminophen (TYLENOL) oral liquid 160 mg/5 mL **OR** acetaminophen, ipratropium-albuterol, labetalol, LORazepam, mouth rinse  Antibiotics: Anti-infectives (From admission, onward)    Start     Dose/Rate Route Frequency Ordered Stop   11/24/21 1530  Ampicillin-Sulbactam (UNASYN) 3 g in sodium chloride 0.9 % 100 mL IVPB        3 g 200 mL/hr over 30 Minutes Intravenous Every 6 hours 11/24/21 1437 11/28/21 2110       Objective:  Vital Signs  Vitals:   12/16/21 2017 12/16/21 2356 12/17/21 0413 12/17/21 0822  BP: 119/75 107/69 123/84 139/89  Pulse: 90 80 85 84  Resp: 18 17 20 17   Temp: 98.5 F (36.9 C) 97.6 F (36.4 C) 98.2 F (36.8 C) 98 F (36.7 C)  TempSrc: Oral   Oral  SpO2: 98% 96% 98% 100%  Weight:      Height:        Intake/Output Summary (Last 24 hours) at 12/17/2021 0927 Last data filed at 12/17/2021 0600 Gross per 24 hour  Intake 540 ml  Output 775 ml  Net -235 ml    Filed Weights   12/11/21 0425 12/12/21 0443 12/14/21 0536  Weight: 56.1 kg 53.8 kg 49.9 kg   Patient is awake alert.  In no distress. Lungs are clear to auscultation bilaterally. S1-S2 is normal regular. Abdomen is soft.  Nontender nondistended  Lab Results:  Data Reviewed: I have personally reviewed following labs and reports of the imaging studies  CBC: Recent Labs  Lab 12/14/21 0602  WBC 12.1*  HGB 12.7*   HCT 35.2*  MCV 100.9*  PLT 349     Basic Metabolic Panel: Recent Labs  Lab 12/14/21 0602 12/16/21 0310  NA 135 135  K 3.7 4.0  CL 105 102  CO2 20* 21*  GLUCOSE 105* 102*  BUN 13 16  CREATININE 0.76 0.75  CALCIUM 9.4 9.3  MG 1.6* 3.2*     GFR: Estimated Creatinine Clearance: 67.6 mL/min (by C-G formula based on SCr of 0.75 mg/dL).  Liver Function Tests: Recent Labs  Lab 12/14/21 0602 12/16/21 0310  AST 39 39  ALT 51* 48*  ALKPHOS 145* 147*  BILITOT 0.5 0.3  PROT 7.5 6.8  ALBUMIN 3.0* 2.8*      CBG: Recent Labs  Lab 12/15/21 2147 12/16/21 1127 12/16/21 1559 12/16/21 2044 12/16/21 2153  GLUCAP 113* 119* 132* 119* 134*       No results found for this or any previous visit (from the past 240 hour(s)).  Radiology Studies: No results found.     LOS: 29 days   Dean Wonder [employer] on [email or web address]  12/17/2021, 9:27 AM

## 2021-12-17 NOTE — Plan of Care (Signed)
  Problem: Education: Goal: Knowledge of disease or condition will improve Outcome: Progressing Goal: Knowledge of secondary prevention will improve (MUST DOCUMENT ALL) Outcome: Progressing Goal: Knowledge of patient specific risk factors will improve (Mark N/A or DELETE if not current risk factor) Outcome: Progressing   Problem: Coping: Goal: Will verbalize positive feelings about self Outcome: Progressing Goal: Will identify appropriate support needs Outcome: Progressing   Problem: Health Behavior/Discharge Planning: Goal: Ability to manage health-related needs will improve Outcome: Progressing Goal: Goals will be collaboratively established with patient/family Outcome: Progressing   Problem: Self-Care: Goal: Ability to participate in self-care as condition permits will improve Outcome: Progressing Goal: Verbalization of feelings and concerns over difficulty with self-care will improve Outcome: Progressing Goal: Ability to communicate needs accurately will improve Outcome: Progressing   Problem: Nutrition: Goal: Risk of aspiration will decrease Outcome: Progressing Goal: Dietary intake will improve Outcome: Progressing   Problem: Education: Goal: Knowledge of General Education information will improve Description: Including pain rating scale, medication(s)/side effects and non-pharmacologic comfort measures Outcome: Progressing   Problem: Health Behavior/Discharge Planning: Goal: Ability to manage health-related needs will improve Outcome: Progressing   Problem: Clinical Measurements: Goal: Will remain free from infection Outcome: Progressing Goal: Diagnostic test results will improve Outcome: Progressing Goal: Respiratory complications will improve Outcome: Progressing Goal: Cardiovascular complication will be avoided Outcome: Progressing   Problem: Activity: Goal: Risk for activity intolerance will decrease Outcome: Progressing   Problem: Nutrition: Goal:  Adequate nutrition will be maintained Outcome: Progressing   Problem: Safety: Goal: Ability to remain free from injury will improve Outcome: Progressing   Problem: Skin Integrity: Goal: Risk for impaired skin integrity will decrease Outcome: Progressing   Problem: Education: Goal: Ability to describe self-care measures that may prevent or decrease complications (Diabetes Survival Skills Education) will improve Outcome: Progressing Goal: Individualized Educational Video(s) Outcome: Progressing   Problem: Fluid Volume: Goal: Ability to maintain a balanced intake and output will improve Outcome: Progressing   Problem: Health Behavior/Discharge Planning: Goal: Ability to identify and utilize available resources and services will improve Outcome: Progressing Goal: Ability to manage health-related needs will improve Outcome: Progressing   Problem: Metabolic: Goal: Ability to maintain appropriate glucose levels will improve Outcome: Progressing   Problem: Nutritional: Goal: Maintenance of adequate nutrition will improve Outcome: Progressing Goal: Progress toward achieving an optimal weight will improve Outcome: Progressing   Problem: Skin Integrity: Goal: Risk for impaired skin integrity will decrease Outcome: Progressing   

## 2021-12-18 DIAGNOSIS — I610 Nontraumatic intracerebral hemorrhage in hemisphere, subcortical: Secondary | ICD-10-CM | POA: Diagnosis not present

## 2021-12-18 DIAGNOSIS — I10 Essential (primary) hypertension: Secondary | ICD-10-CM | POA: Diagnosis not present

## 2021-12-18 LAB — GLUCOSE, CAPILLARY
Glucose-Capillary: 104 mg/dL — ABNORMAL HIGH (ref 70–99)
Glucose-Capillary: 104 mg/dL — ABNORMAL HIGH (ref 70–99)
Glucose-Capillary: 127 mg/dL — ABNORMAL HIGH (ref 70–99)
Glucose-Capillary: 112 mg/dL — ABNORMAL HIGH (ref 70–99)

## 2021-12-18 MED ORDER — CYANOCOBALAMIN 1000 MCG PO TABS: 1000.0000 ug | ORAL_TABLET | Freq: Every day | ORAL | Status: DC

## 2021-12-18 MED ORDER — ADULT MULTIVITAMIN W/MINERALS CH: 1.0000 | ORAL_TABLET | Freq: Every day | ORAL | Status: DC

## 2021-12-18 MED ORDER — FOLIC ACID 1 MG PO TABS: 1.0000 mg | ORAL_TABLET | Freq: Every day | ORAL | Status: DC

## 2021-12-18 MED ORDER — METOPROLOL TARTRATE 50 MG PO TABS: 50.0000 mg | ORAL_TABLET | Freq: Two times a day (BID) | ORAL | Status: DC

## 2021-12-18 MED ORDER — MAGNESIUM OXIDE -MG SUPPLEMENT 400 (240 MG) MG PO TABS: 400.0000 mg | ORAL_TABLET | Freq: Every day | ORAL | Status: DC

## 2021-12-18 MED ORDER — PANTOPRAZOLE SODIUM 40 MG PO TBEC: 40.0000 mg | DELAYED_RELEASE_TABLET | Freq: Every day | ORAL | Status: AC

## 2021-12-18 MED ORDER — SENNOSIDES-DOCUSATE SODIUM 8.6-50 MG PO TABS: 2.0000 | ORAL_TABLET | Freq: Two times a day (BID) | ORAL | Status: DC

## 2021-12-18 MED ORDER — POLYETHYLENE GLYCOL 3350 17 G PO PACK: 17.0000 g | PACK | Freq: Every day | ORAL | 0 refills | Status: DC

## 2021-12-18 MED ORDER — QUETIAPINE FUMARATE 50 MG PO TABS: 50.0000 mg | ORAL_TABLET | Freq: Two times a day (BID) | ORAL | Status: DC

## 2021-12-18 MED ORDER — ENSURE ENLIVE PO LIQD: 237.0000 mL | Freq: Three times a day (TID) | ORAL | 12 refills | Status: DC

## 2021-12-18 MED ORDER — ACETAMINOPHEN 325 MG PO TABS: 650.0000 mg | ORAL_TABLET | ORAL | Status: DC | PRN

## 2021-12-18 MED ORDER — VITAMIN B-1 100 MG PO TABS: 100.0000 mg | ORAL_TABLET | Freq: Every day | ORAL | Status: DC

## 2021-12-18 NOTE — Discharge Summary (Signed)
[facility]  Physician Discharge Summary   Patient ID: Brent Warren MRN: [id number] DOB/AGE: July 19, 1960 61 y.o.  Admit date: 11/18/2021 Discharge date:   12/18/2021   PCP: Ladell Pier, MD  DISCHARGE DIAGNOSES:    ICH (intracerebral hemorrhage) ([place])   Hyponatremia   HTN (hypertension)   Pulmonary emphysema ([place])   Tobacco abuse   ETOH abuse   Alcohol withdrawal delirium ([place])    Protein-calorie malnutrition, severe   RECOMMENDATIONS FOR OUTPATIENT FOLLOW UP: Patient needs outpatient follow-up with neurology CBC and complete metabolic panel and magnesium level every week   Home Health: Going to SNF Equipment/Devices: None  CODE STATUS: Full code  DISCHARGE CONDITION: fair  Diet recommendation: Heart healthy  INITIAL HISTORY: 61 year old male with history of HTN, tobacco use, chronic alcohol use presented with sudden onset slurred speech and right-sided weakness.  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  He was admitted to the neuro ICU, remained stable and transferred to the hospitalist service on 11/13.  Patient also developed alcohol withdrawal during this hospitalization.     HOSPITAL COURSE:   Left basal ganglia ICH Neurology was following and signed off on 11/24/2021 and recommended outpatient follow-up with neurology Echo showed EF of 60 to 65%.  LDL 62, A1c is 5.1 PT/OT recommending CIR.  As per CIR, patient is not a good candidate for CIR.   Patient to go to skilled nursing facility.   Oropharyngeal dysphagia Initially was kept n.p.o. and started on nasogastric tube feedings.   Seen by speech therapy and diet was advanced.   Has had good caloric intake.  NG tube was removed on 12/3. Tolerating his diet.  Continue to encourage oral intake.   Essential hypertension Initially was on amlodipine and metoprolol.  Blood pressures improved and then started trending low.   Amlodipine dose was initially decreased.  Blood  pressure again noted to be borderline low.  Will discontinue it altogether.  Continue just metoprolol for now.     PSVT Telemetry showed brief episodes of supraventricular tachycardia on 11/23. Echocardiogram shows normal systolic function.   TSH noted to be 5.0 with a normal free T4 of 0.77. Patient was started on beta-blocker.  Telemetry showed improvement.  Subsequently taken off of telemetry.   Agitation Seems to be stable on Seroquel.  Has been off of restraints for several days now.   Alcohol abuse, alcohol withdrawal with delirium tremens Patient was noted to have significant alcohol withdrawal symptoms.  PCCM consulted and have signed off.  Completed phenobarbital taper.  Vitamin B1 normal Continue thiamine, folic acid and multivitamin   Hypokalemia/hyponatremia/hypomagnesemia Monitor electrolytes periodically.  On magnesium oxide.  Dose of magnesium oxide was decreased due to a magnesium level of 3.2.   Possible aspiration pneumonia/Possible UTI UA suggestive of UTI as well.  Cultures negative. Completed 5 days of Unasyn.   Respiratory status noted to be stable.     Vitamin B12 deficiency B12 177.  Folate 12.7.   Continue cyanocobalamine.     Macrocytic anemia Secondary to alcohol abuse as well as B12 deficiency. Ferritin noted to be 1074.  Iron 95.  TIBC 252.   Elevated LFTs AST and ALT are noted to be minimally elevated.  Possibly from alcohol use.  No recent imaging study of the hepatobiliary system noted in EMR.   Since levels are reasonably stable this can be pursued in the outpatient setting. Hepatitis panel is unremarkable.   Tobacco use, history of emphysema Continue nebs as  needed Respiratory status is stable.   Severe protein calorie malnutrition Nutrition Problem: Severe Malnutrition Etiology: social / environmental circumstances Signs/Symptoms: severe fat depletion, severe muscle depletion   Patient is stable.  Okay for discharge to SNF  today.   PERTINENT LABS:  The results of significant diagnostics from this hospitalization (including imaging, microbiology, ancillary and laboratory) are listed below for reference.     Labs:   Basic Metabolic Panel: Recent Labs  Lab 12/14/21 0602 12/16/21 0310  NA 135 135  K 3.7 4.0  CL 105 102  CO2 20* 21*  GLUCOSE 105* 102*  BUN 13 16  CREATININE 0.76 0.75  CALCIUM 9.4 9.3  MG 1.6* 3.2*   Liver Function Tests: Recent Labs  Lab 12/14/21 0602 12/16/21 0310  AST 39 39  ALT 51* 48*  ALKPHOS 145* 147*  BILITOT 0.5 0.3  PROT 7.5 6.8  ALBUMIN 3.0* 2.8*    CBC: Recent Labs  Lab 12/14/21 0602  WBC 12.1*  HGB 12.7*  HCT 35.2*  MCV 100.9*  PLT 349     CBG: Recent Labs  Lab 12/16/21 2153 12/17/21 1145 12/17/21 1642 12/17/21 2022 12/18/21 0626  GLUCAP 134* 139* 112* 107* 104*     IMAGING STUDIES DG Swallowing Func-Speech Pathology  Result Date: 11/28/2021 Table formatting from the original result was not included. Images from the original result were not included. Objective Swallowing Evaluation: Type of Study: MBS-Modified Barium Swallow Study  Patient Details Name: Brent Warren MRN: [id number] Date of Birth: [birth year] Today's Date: 11/28/2021 Time: SLP Start Time (ACUTE ONLY): 40 -SLP Stop Time (ACUTE ONLY): 1045 SLP Time Calculation (min) (ACUTE ONLY): 25 min Past Medical History: Past Medical History: Diagnosis Date  Alcoholic ([place])   Chest pain 04/02/2017  COPD (chronic obstructive pulmonary disease) ([place])   Empyema of pleural space ([place]) 05/2015  ETOH abuse 11/06/2016  Hepatitis 05/22/2015  HTN (hypertension)   Immunization due 11/06/2016  Lung nodule, multiple 11/08/2015  Malnutrition of moderate degree 06/02/2015  Pneumonia 05/2015  Pulmonary emphysema ([place]) 11/06/2016  Tobacco abuse   Urge incontinence 11/06/2016 Past Surgical History: Past Surgical History: Procedure Laterality Date  arm surgery    DECORTICATION Right 06/03/2015  Procedure:  DECORTICATION;  Surgeon: Melrose Nakayama, MD;  Location: [facility];  Service: Thoracic;  Laterality: Right;  VIDEO ASSISTED THORACOSCOPY (VATS)/EMPYEMA Right 06/03/2015  Procedure: VIDEO ASSISTED THORACOSCOPY (VATS)/EMPYEMA;  Surgeon: Melrose Nakayama, MD;  Location[facility] OR;  Service: Thoracic;  Laterality: Right; HPI: 61 year old male with history of HTN, tobacco use, EtOH use who comes into the hospital with sudden onset slurred speech and right-sided weakness.  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   He started to develop increased withdrawal symptoms on 11/13  No data recorded  Recommendations for follow up therapy are one component of a multi-disciplinary discharge planning process, led by the attending physician.  Recommendations may be updated based on patient status, additional functional criteria and insurance authorization. Assessment / Plan / Recommendation   11/28/2021  12:30 PM Clinical Impressions Clinical Impression Pt demonstrates mild oral dysphagia with slow lingual mobility, but adequate to clear most of the bolus from the oral cavity. Pt does have decreased bolus cohesion, contributing to premature spillage and delayed swallowing present with all trials. No penetration or aspiration occurred. No residue, no significant weakness. 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 Thin liquids shold be toelrated if  pt is sitting upright. Pills can be given whole in puree. Will f/u for tolerance.     11/28/2021  12:30 PM Treatment Recommendations Treatment Recommendations Therapy as outlined in treatment plan below     11/28/2021  12:30 PM Prognosis Prognosis for Safe Diet Advancement Good   11/28/2021  12:30 PM Diet Recommendations SLP Diet Recommendations Dysphagia 2 (Fine chop) solids;Thin liquid Liquid Administration via Cup;Straw Medication Administration Whole meds with puree Compensations Slow  rate;Small sips/bites Postural Changes Remain semi-upright after after feeds/meals (Comment);Seated upright at 90 degrees     11/28/2021  12:30 PM Other Recommendations Oral Care Recommendations Oral care BID Follow Up Recommendations Acute inpatient rehab (3hours/day)   11/28/2021  12:30 PM Frequency and Duration  Speech Therapy Frequency (ACUTE ONLY) min 2x/week Treatment Duration 2 weeks     11/28/2021  12:30 PM Oral Phase Oral Phase Impaired Oral - Nectar Cup Decreased bolus cohesion Oral - Nectar Straw Decreased bolus cohesion Oral - Thin Straw Decreased bolus cohesion Oral - Mech Soft Decreased bolus cohesion Oral - Pill [facility]    11/28/2021  12:30 PM Pharyngeal Phase Pharyngeal Phase Impaired Pharyngeal- Nectar Cup Delayed swallow initiation-pyriform sinuses Pharyngeal- Nectar Straw Delayed swallow initiation-pyriform sinuses Pharyngeal- Thin Straw Delayed swallow initiation-pyriform sinuses Pharyngeal- Puree Delayed swallow initiation-vallecula Pharyngeal- Mechanical Soft Delayed swallow initiation-vallecula     No data to display    DeBlois, Riley Nearing 11/28/2021, 1:06 PM                     DG CHEST PORT 1 VIEW  Result Date: 11/24/2021 CLINICAL DATA:  Fever EXAM: PORTABLE CHEST 1 VIEW COMPARISON:  Chest radiograph done on 06/16/2016, CT chest done on 10/20/2020 FINDINGS: Cardiac size is within normal limits. Distal portion of enteric tube is seen in the stomach. Subtle increased interstitial markings are seen in left lower lung field. There is no pleural effusion or pneumothorax. IMPRESSION: Increased interstitial markings in the left lower lung fields suggest interstitial pneumonia. Electronically Signed   By: Ernie Avena M.D.   On: 11/24/2021 12:04   EEG adult  Result Date: 11/22/2021 Charlsie Quest, MD     11/22/2021  9:31 PM Patient Name: Brent Warren MRN: [id number] Epilepsy Attending: Charlsie Quest Referring Physician/Provider: Marvel Plan, MD Date: 11/22/2021 Duration: 22.28  mins Patient history: 61 year old male with left basal ganglia hemorrhage and altered mental status.  EEG to evaluate for seizure. Level of alertness: Awake AEDs during EEG study: Phenobarb Technical aspects: This EEG study was done with scalp electrodes positioned according to the 10-20 International system of electrode placement. Electrical activity was reviewed with band pass filter of 1-70Hz , sensitivity of 7 uV/mm, display speed of 35mm/sec with a 60Hz  notched filter applied as appropriate. EEG data were recorded continuously and digitally stored.  Video monitoring was available and reviewed as appropriate. Description: EEG showed continuous generalized 3 to 6 Hz theta-delta slowing admixed with an excessive amount of 15 to 18 Hz beta activity distributed symmetrically and diffusely. Hyperventilation and photic stimulation were not performed.   ABNORMALITY - Continuous slow, generalized - Excessive beta, generalized IMPRESSION: This study is suggestive of moderate to severe diffuse encephalopathy, nonspecific etiology. No seizures or epileptiform discharges were seen throughout the recording.   DG Abd Portable 1V  Result Date: 11/22/2021 CLINICAL DATA:  NG tube placement EXAM: PORTABLE ABDOMEN - 1 VIEW COMPARISON:  None Available. FINDINGS: Enteric tube tip projects over the stomach. No focal consolidation in the partially imaged  lung bases. Non dilated bowel loops in the upper abdomen. IMPRESSION: Enteric tube tip projects over the stomach. Electronically Signed   By: Agustin Cree M.D.   On: 11/22/2021 11:14   CT HEAD WO CONTRAST ( )  Result Date: 11/20/2021 CLINICAL DATA:  Altered mental status, intraparenchymal hematoma. EXAM: CT HEAD WITHOUT CONTRAST TECHNIQUE: Contiguous axial images were obtained from the base of the skull through the vertex without intravenous contrast. RADIATION DOSE REDUCTION: This exam was performed according to the departmental dose-optimization program  which includes automated exposure control, adjustment of the mA and/or kV according to patient size and/or use of iterative reconstruction technique. COMPARISON:  CT head 2 days prior. FINDINGS: Brain: The 2.2 cm x 1.7 cm x 1.7 cm intraparenchymal hematoma centered in the left basal ganglia is unchanged in size and appearance. There is mild surrounding edema without mass effect or midline shift. There is no new acute intracranial hemorrhage or extra-axial fluid collection. There is no acute territorial infarct. Parenchymal volume is stable. The ventricles are stable in size. There is no mass lesion. Vascular: No hyperdense vessel or unexpected calcification. Skull: Normal. Negative for fracture or focal lesion. Sinuses/Orbits: The imaged paranasal sinuses are clear. The globes and orbits are unremarkable. Other: None. IMPRESSION: Unchanged intraparenchymal hematoma centered in the left basal ganglia. No new acute intracranial pathology. Electronically Signed   By: Lesia Hausen M.D.   On: 11/20/2021 15:31   ECHOCARDIOGRAM COMPLETE  Result Date: 11/19/2021    ECHOCARDIOGRAM REPORT   Patient Name:   Brent Warren Date of Exam: 11/19/2021 Medical Rec #:  [id number]   Height:       69.0 in Accession #:    [id number]  Weight:       130.5 lb Date of Birth:  02/15/1960    BSA:          1.723 m Patient Age:    61 years    BP:           116/100 mmHg Patient Gender: M           HR:           66 bpm. Exam Location:  Inpatient Procedure: 2D Echo, Cardiac Doppler, Color Doppler and Intracardiac            Opacification Agent Indications:    Stroke  History:        Patient has prior history of Echocardiogram examinations, most                 recent 05/02/2017. Risk Factors:Hypertension and Current Smoker.                 ETOH abuse.  Sonographer:    Ross Ludwig RDCS (AE) Referring Phys: [facility]  Sonographer Comments: Technically difficult study due to poor echo windows, suboptimal parasternal window, suboptimal apical  window and suboptimal subcostal window. IMPRESSIONS  1. Left ventricular ejection fraction, by estimation, is 60 to 65%. The left ventricle has normal function. The left ventricle has no regional wall motion abnormalities. Left ventricular diastolic parameters are consistent with Grade I diastolic dysfunction (impaired relaxation).  2. Right ventricular systolic function is normal. The right ventricular size is normal. Tricuspid regurgitation signal is inadequate for assessing PA pressure.  3. The mitral valve is grossly normal. No evidence of mitral valve regurgitation.  4. The aortic valve was not well visualized. Aortic valve regurgitation is not visualized.  5. The inferior vena cava is normal in size with greater than 50%  respiratory variability, suggesting right atrial pressure of 3 mmHg. Comparison(s): Changes from prior study are noted. 05/02/2017 (stress echo) - negative for ischemia, LVEF 50-55% at baseline. FINDINGS  Left Ventricle: Left ventricular ejection fraction, by estimation, is 60 to 65%. The left ventricle has normal function. The left ventricle has no regional wall motion abnormalities. Definity contrast agent was given IV to delineate the left ventricular  endocardial borders. The left ventricular internal cavity size was normal in size. There is no left ventricular hypertrophy. Left ventricular diastolic parameters are consistent with Grade I diastolic dysfunction (impaired relaxation). Indeterminate filling pressures. Right Ventricle: The right ventricular size is normal. No increase in right ventricular wall thickness. Right ventricular systolic function is normal. Tricuspid regurgitation signal is inadequate for assessing PA pressure. Left Atrium: Left atrial size was normal in size. Right Atrium: Right atrial size was normal in size. Pericardium: There is no evidence of pericardial effusion. Mitral Valve: The mitral valve is grossly normal. No evidence of mitral valve regurgitation.  Tricuspid Valve: The tricuspid valve is not well visualized. Tricuspid valve regurgitation is not demonstrated. Aortic Valve: The aortic valve was not well visualized. Aortic valve regurgitation is not visualized. Aortic valve mean gradient measures 2.0 mmHg. Aortic valve peak gradient measures 4.4 mmHg. Pulmonic Valve: The pulmonic valve was not well visualized. Pulmonic valve regurgitation is not visualized. Aorta: The aortic root and ascending aorta are structurally normal, with no evidence of dilitation. Venous: The inferior vena cava is normal in size with greater than 50% respiratory variability, suggesting right atrial pressure of 3 mmHg. IAS/Shunts: The interatrial septum appears to be lipomatous. No atrial level shunt detected by color flow Doppler.   Diastology LV e' medial:    4.35 cm/s LV E/e' medial:  12.6 LV e' lateral:   7.51 cm/s LV E/e' lateral: 7.3  RIGHT VENTRICLE             IVC RV Basal diam:  3.30 cm     IVC diam: 1.40 cm RV S prime:     13.50 cm/s TAPSE (M-mode): 1.5 cm LEFT ATRIUM           Index       RIGHT ATRIUM           Index LA Vol (A4C): 15.8 ml 9.17 ml/m  RA Area:     13.20 cm                                   RA Volume:   34.30 ml  19.90 ml/m  AORTIC VALVE AV Vmax:           105.00 cm/s AV Vmean:          69.800 cm/s AV VTI:            0.198 m AV Peak Grad:      4.4 mmHg AV Mean Grad:      2.0 mmHg LVOT Vmax:         92.50 cm/s LVOT Vmean:        56.200 cm/s LVOT VTI:          0.157 m LVOT/AV VTI ratio: 0.79 MITRAL VALVE MV Area (PHT): 2.99 cm    SHUNTS MV Decel Time: 254 msec    Systemic VTI: 0.16 m MV E velocity: 54.80 cm/s MV A velocity: 74.80 cm/s MV E/A ratio:  0.73 Zoila ShutterKenneth Hilty MD Electronically signed by Zoila ShutterKenneth Hilty MD Signature  Date/Time: 11/19/2021/2:04:56 PM    Final    CT HEAD WO CONTRAST  Result Date: 11/18/2021 CLINICAL DATA:  Hemorrhagic stroke. EXAM: CT HEAD WITHOUT CONTRAST TECHNIQUE: Contiguous axial images were obtained from the base of the skull through  the vertex without intravenous contrast. RADIATION DOSE REDUCTION: This exam was performed according to the departmental dose-optimization program which includes automated exposure control, adjustment of the mA and/or kV according to patient size and/or use of iterative reconstruction technique. COMPARISON:  CT angiography dated 11/18/2021. FINDINGS: Brain: No significant interval change in the left basal ganglia hemorrhage. No new hemorrhage. There is mild age-related atrophy and chronic microvascular ischemic changes. No mass effect or midline shift. No extra-axial fluid collection. Vascular: No hyperdense vessel or unexpected calcification. Skull: Normal. Negative for fracture or focal lesion. Sinuses/Orbits: The visualized paranasal sinuses and mastoid air cells are clear. There is deviation of the nose to the left. Cerumen noted in the external auditory canals bilaterally. Other: None IMPRESSION: 1. No significant interval change in the left basal ganglia hemorrhage. No new hemorrhage. 2. Mild age-related atrophy and chronic microvascular ischemic changes. Electronically Signed   By: Anner Crete M.D.   On: 11/18/2021 19:34   MR BRAIN W WO CONTRAST  Result Date: 11/18/2021 CLINICAL DATA:  Neuro deficit, acute, stroke suspected EXAM: MRI HEAD WITHOUT AND WITH CONTRAST TECHNIQUE: Multiplanar, multiecho pulse sequences of the brain and surrounding structures were obtained without and with intravenous contrast. CONTRAST:  59mL GADAVIST GADOBUTROL 1 MMOL/ML IV SOLN COMPARISON:  CT head from the same day. FINDINGS: Brain: When comparing across modalities, similar size of acute hemorrhage in the left basal ganglia. Mild surrounding edema. No significant mass effect. No midline shift. No surrounding restricted diffusion to suggest acute fracture. No visible mass lesion; however, acute blood products limit assessment. Patchy T2/FLAIR hyperintensity in the white matter, nonspecific but compatible with chronic  microvascular disease. Cerebral atrophy. No hydrocephalus. No pathologic enhancement. Vascular: Major arterial flow voids are maintained skull base. Skull and upper cervical spine: Normal marrow signal. Sinuses/Orbits: Clear sinuses.  No acute orbital findings. Other: No mastoid effusions. IMPRESSION: When comparing across modalities, similar size of acute hemorrhage in the left basal ganglia. No visible acute infarct or mass lesion; however, acute blood products limits assessment. Electronically Signed   By: Margaretha Sheffield M.D.   On: 11/18/2021 14:50   CT ANGIO HEAD NECK W WO CM (CODE STROKE)  Result Date: 11/18/2021 CLINICAL DATA:  Code stroke. 61 year male with weakness. Acute left basal ganglia hemorrhage on plain CT. EXAM: CT ANGIOGRAPHY HEAD AND NECK TECHNIQUE: Multidetector CT imaging of the head and neck was performed using the standard protocol during bolus administration of intravenous contrast. Multiplanar CT image reconstructions and MIPs were obtained to evaluate the vascular anatomy. Carotid stenosis measurements (when applicable) are obtained utilizing NASCET criteria, using the distal internal carotid diameter as the denominator. RADIATION DOSE REDUCTION: This exam was performed according to the departmental dose-optimization program which includes automated exposure control, adjustment of the mA and/or kV according to patient size and/or use of iterative reconstruction technique. CONTRAST:  93mL OMNIPAQUE IOHEXOL 350 MG/ML SOLN COMPARISON:  Plain head CT 1132 hours today. FINDINGS: CTA NECK Skeleton: Cervical spine degeneration with developing degenerative appearing upper cervical multilevel posterior element ankylosis. No acute or suspicious osseous lesion identified. Upper chest: Centrilobular and paraseptal emphysema. Negative visible superior mediastinum. Other neck: Chronic nasal bone fractures. No acute finding identified. Aortic arch: 3 vessel arch configuration. Mild Calcified aortic  atherosclerosis.  Right carotid system: Brachiocephalic artery and proximal right CCA are negative. Minimal plaque in the distal right CCA and at the bifurcation primarily affecting the ECA. No stenosis. Left carotid system: Intermittent mild left CCA plaque before the bifurcation without stenosis. Mild calcified plaque at the bifurcation more affecting the ECA. No stenosis. Vertebral arteries: Proximal right subclavian artery and right vertebral artery origin plaque with only mild vertebral origin stenosis (series 8, image 103). Right vertebral appears somewhat non dominant but patent to the skull base without additional stenosis. Proximal left subclavian artery plaque with no significant stenosis. Mild plaque at the left vertebral artery origin with mild if any stenosis. Mildly dominant left vertebral artery is patent to the skull base with no additional stenosis. CTA HEAD Posterior circulation: Dominant left V4 segment especially distal to the patent right PICA origin. Normal left PICA. Mild left V4 calcified plaque. No significant distal vertebral or vertebrobasilar junction stenosis. Patent basilar artery without stenosis. Patent SCA and PCA origins. Right PCA branches are within normal limits. Left PCA mild P2 segment irregularity with no significant stenosis. Anterior circulation: Both ICA siphons are patent. Mild, mostly supraclinoid calcified plaque bilaterally. Mild supraclinoid ICA stenosis more pronounced on the right. Patent carotid termini. Patent MCA and ACA origins. Anterior communicating artery and ACA branches are within normal limits. The left A2 appears to be dominant. Right MCA M1 segment and bifurcation are patent without stenosis. Right MCA branches are within normal limits. No left hemisphere CTA spot sign. Left MCA M1 segment is tortuous without stenosis. Patent left MCA bifurcation without stenosis. Left MCA branches are patent with some mild M2 and M3 irregularity in the middle sylvian  division. Venous sinuses: Early contrast timing, not well evaluated. Anatomic variants: Dominant left vertebral artery. Dominant left ACA A2. Review of the MIP images confirms the above findings IMPRESSION: 1. No CTA spot sign in association with left basal ganglia hemorrhage. Negative for large vessel occlusion. 2. Generally mild atherosclerosis in the head and neck. No hemodynamically significant stenosis. 3. Cervical spine degeneration. Aortic Atherosclerosis (ICD10-I70.0) and Emphysema (ICD10-J43.9). Salient findings were communicated to Dr. Lorrin Goodell at 12:05 pm on 11/18/2021 by text page via the [facility] messaging system. Electronically Signed   By: Genevie Ann M.D.   On: 11/18/2021 12:05   CT HEAD CODE STROKE WO CONTRAST  Result Date: 11/18/2021 CLINICAL DATA:  Code stroke.  61 year male with weakness. EXAM: CT HEAD WITHOUT CONTRAST TECHNIQUE: Contiguous axial images were obtained from the base of the skull through the vertex without intravenous contrast. RADIATION DOSE REDUCTION: This exam was performed according to the departmental dose-optimization program which includes automated exposure control, adjustment of the mA and/or kV according to patient size and/or use of iterative reconstruction technique. COMPARISON:  Head CT 08/24/2010. FINDINGS: Brain: Globular hyperdense hemorrhage at the left basal ganglia, centered at the lentiform encompasses 19 x 22 x 21 mm (AP by transverse by CC), estimated volume 4 mL. Mild surrounding edema. No significant mass effect. No intraventricular or extra-axial extension of blood. Generalized cerebral volume loss since 2012 seems advanced for age. No disproportionate ventriculomegaly. No midline shift. Patchy additional bilateral cerebral white matter hypodensity. No superimposed cortically based acute infarct identified. Vascular: Calcified atherosclerosis at the skull base. No suspicious intracranial vascular hyperdensity. Skull: No acute osseous abnormality identified.  Sinuses/Orbits: Visualized paranasal sinuses and mastoids are clear. Other: Visualized orbits and scalp soft tissues are within normal limits. ASPECTS [facility] Stroke Program Early CT Score) Total score (0-10 with 10 being  normal): Not applicable, acute hemorrhage Study discussed by telephone with Dr. Lorrin Goodell on 11/18/2021 at 11:46 . IMPRESSION: 1. 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 Mild surrounding edema. No significant mass effect. This was briefly discussed by telephone with Dr. Lorrin Goodell on 11/18/2021 at 11:46. 2. Evidence of underlying chronic small vessel disease. Generalized brain volume loss since 2012 seems advanced for age. 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 Electronically Signed   By: Genevie Ann M.D.   On: 11/18/2021 11:49    DISCHARGE EXAMINATION: Vitals:   12/17/21 1139 12/17/21 1613 12/17/21 2022 12/18/21 0719  BP: 128/81 115/74 109/86 (!) 111/90  Pulse: 81 86 88   Resp: 17 19 18 19   Temp: 98.8 F (37.1 C) 98.1 F (36.7 C) 97.6 F (36.4 C) 98.9 F (37.2 C)  TempSrc: Oral Oral Oral Oral  SpO2: 98% 96% 97% 99%  Weight:      Height:       General appearance: Awake alert.  In no distress Resp: Clear to auscultation bilaterally.  Normal effort Cardio: S1-S2 is normal regular.  No S3-S4.  No rubs murmurs or bruit GI: Abdomen is soft.  Nontender nondistended.  Bowel sounds are present normal.  No masses organomegaly   DISPOSITION: SNF  Discharge Instructions     Ambulatory referral to Neurology   Complete by: As directed    Follow up with stroke clinic NP (Jessica Vanschaick or Cecille Rubin, if both not available, consider Zachery Dauer, or Ahern) at [facility] in about 4 weeks. Thanks.   Call MD for:  difficulty breathing, headache or visual disturbances   Complete by: As directed    Call MD for:  extreme fatigue   Complete by: As directed    Call MD for:  persistant dizziness or light-headedness   Complete by: As directed    Call MD for:  persistant nausea  and vomiting   Complete by: As directed    Call MD for:  severe uncontrolled pain   Complete by: As directed    Call MD for:  temperature >100.4   Complete by: As directed    Diet - low sodium heart healthy   Complete by: As directed    Discharge instructions   Complete by: As directed    Please review instructions on the discharge summary.  You were cared for by a hospitalist during your hospital stay. If you have any questions about your discharge medications or the care you received while you were in the hospital after you are discharged, you can call the unit and asked to speak with the hospitalist on call if the hospitalist that took care of you is not available. Once you are discharged, your primary care physician will handle any further medical issues. Please note that NO REFILLS for any discharge medications will be authorized once you are discharged, as it is imperative that you return to your primary care physician (or establish a relationship with a primary care physician if you do not have one) for your aftercare needs so that they can reassess your need for medications and monitor your lab values. If you do not have a primary care physician, you can call [phone] for a physician referral.   Increase activity slowly   Complete by: As directed    No wound care   Complete by: As directed           Allergies as of 12/18/2021   No Known Allergies      Medication List  STOP taking these medications    amLODipine 10 MG tablet Commonly known as: NORVASC   loperamide 2 MG tablet Commonly known as: Imodium A-D   meloxicam 15 MG tablet Commonly known as: MOBIC       TAKE these medications    acetaminophen 325 MG tablet Commonly known as: TYLENOL Take 2 tablets (650 mg total) by mouth every 4 (four) hours as needed for mild pain (or temp > 37.5 C (99.5 F)).   albuterol (2.5 MG/3ML) 0.083% nebulizer solution Commonly known as: PROVENTIL Take 3 mLs (2.5 mg  total) by nebulization every 6 (six) hours as needed for wheezing or shortness of breath.   Ventolin HFA 108 (90 Base) MCG/ACT inhaler Generic drug: albuterol Inhale 2 puffs into the lungs every 6 (six) hours as needed for wheezing or shortness of breath.   cyanocobalamin 1000 MCG tablet Take 1 tablet (1,000 mcg total) by mouth daily. Start taking on: December 19, 2021   feeding supplement Liqd Take 237 mLs by mouth 3 (three) times daily between meals.   folic acid 1 MG tablet Commonly known as: FOLVITE Take 1 tablet (1 mg total) by mouth daily. Start taking on: December 19, 2021   magnesium oxide 400 (240 Mg) MG tablet Commonly known as: MAG-OX Take 1 tablet (400 mg total) by mouth daily. Start taking on: December 19, 2021   metoprolol tartrate 50 MG tablet Commonly known as: LOPRESSOR Take 1 tablet (50 mg total) by mouth 2 (two) times daily.   multivitamin with minerals Tabs tablet Take 1 tablet by mouth daily. Start taking on: December 19, 2021   pantoprazole 40 MG tablet Commonly known as: PROTONIX Take 1 tablet (40 mg total) by mouth daily.   polyethylene glycol 17 g packet Commonly known as: MIRALAX / GLYCOLAX Take 17 g by mouth daily. Start taking on: December 19, 2021   QUEtiapine 50 MG tablet Commonly known as: SEROQUEL Take 1 tablet (50 mg total) by mouth 2 (two) times daily.   senna-docusate 8.6-50 MG tablet Commonly known as: Senokot-S Take 2 tablets by mouth 2 (two) times daily.   Symbicort 80-4.5 MCG/ACT inhaler Generic drug: budesonide-formoterol Inhale 2 puffs into the lungs 2 (two) times daily   thiamine 100 MG tablet Commonly known as: Vitamin B-1 Take 1 tablet (100 mg total) by mouth daily. Start taking on: December 19, 2021          Follow-up Information     [facility]. Schedule an appointment as soon as possible for a visit in 1 month(s).   Specialty: Neurology Why: stroke clinic Contact information: [address] [phone]        Ladell Pier, MD. Schedule an appointment as soon as possible for a visit.   Specialty: Internal Medicine Why: post hospitalization follow up Contact information: [address] [place] [address] [phone]                 TOTAL DISCHARGE TIME: 41 minutes  [place]  [facility] Pager on [email or web address]  12/18/2021, 9:27 AM

## 2021-12-18 NOTE — Progress Notes (Signed)
Occupational Therapy Treatment Patient Details Name: Brent Warren MRN: [id number] DOB: [birth year] Today's Date: 12/18/2021   History of present illness 61 yo male presenting 11/11 with R-sided weakness, facial droop and slurred speech. 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 PMH includes: HTN, COPD, and alcohol abuse.   OT comments  Patient continues to make incremental progress towards goals in skilled OT session. Patient's session encompassed assessment of goals, with goals upgraded to reflect progress. Patient stating he wants to work on his RUE more, therefore goal updated to reflect. Patient min A to transition to EOB, then engaging in functional mobility with min A to stand and handed off to mobility specialist at end of session. Recommendation updated to SNF due to AIR refusal. OT will continue to follow.    Recommendations for follow up therapy are one component of a multi-disciplinary discharge planning process, led by the attending physician.  Recommendations may be updated based on patient status, additional functional criteria and insurance authorization.    Follow Up Recommendations  Skilled nursing-short term rehab (<3 hours/day)     Assistance Recommended at Discharge Frequent or constant Supervision/Assistance  Patient can return home with the following  A little help with walking and/or transfers;A little help with bathing/dressing/bathroom;Direct supervision/assist for medications management;Direct supervision/assist for financial management;Assist for transportation;Help with stairs or ramp for entrance   Equipment Recommendations  BSC/3in1;Wheelchair (measurements OT);Wheelchair cushion (measurements OT)    Recommendations for Other Services      Precautions / Restrictions Precautions Precautions: Fall Precaution Comments: watch HR Restrictions Weight Bearing Restrictions: No       Mobility Bed Mobility Overal bed mobility: Needs Assistance Bed Mobility:  Supine to Sit     Supine to sit: Min assist     General bed mobility comments: pulling up on OT to come to EOB, could likely complete by himself however requesting external assist    Transfers Overall transfer level: Needs assistance Equipment used: Rolling walker (2 wheels) Transfers: Sit to/from Stand Sit to Stand: Min assist           General transfer comment: min A to come into standing, patient with cues to place R hand on walker, handed off to mobility at end of session     Balance Overall balance assessment: Needs assistance Sitting-balance support: Feet supported, No upper extremity supported Sitting balance-Leahy Scale: Fair     Standing balance support: Bilateral upper extremity supported, During functional activity Standing balance-Leahy Scale: Poor Standing balance comment: reliant on external assist                           ADL either performed or assessed with clinical judgement   ADL Overall ADL's : Needs assistance/impaired                     Lower Body Dressing: Moderate assistance;Sit to/from stand Lower Body Dressing Details (indicate cue type and reason): to adjust pants Toilet Transfer: Minimal assistance;Ambulation Toilet Transfer Details (indicate cue type and reason): simluated with sit<>stand with OT, handed off to mobility at end of session         Functional mobility during ADLs: Minimal assistance;+2 for safety/equipment;Cueing for safety;Cueing for sequencing General ADL Comments: Patient continues to progress towards goals, with goals updated to reflect progress    Extremity/Trunk Assessment Upper Extremity Assessment Upper Extremity Assessment: RUE deficits/detail RUE Deficits / Details: increased flexor tone. Pt able to actively flex R digits with  2/+5 grasp. Able to perform PROM elbow extension to ~20 degrees, but unable to fully extend RUE Sensation: decreased light touch RUE Coordination: decreased fine  motor;decreased gross motor            Vision       Perception     Praxis      Cognition Arousal/Alertness: Awake/alert Behavior During Therapy: Restless Overall Cognitive Status: Difficult to assess Area of Impairment: Following commands, Attention, Problem solving, Awareness, Safety/judgement                   Current Attention Level: Focused, Sustained   Following Commands: Follows one step commands with increased time Safety/Judgement: Decreased awareness of safety, Decreased awareness of deficits Awareness: Intellectual Problem Solving: Slow processing General Comments: perseverating on RUE that doesnt work, but more participatory in session        Exercises      Shoulder Instructions       General Comments      Pertinent Vitals/ Pain       Pain Assessment Pain Assessment: No/denies pain  Home Living                                          Prior Functioning/Environment              Frequency  Min 2X/week        Progress Toward Goals  OT Goals(current goals can now be found in the care plan section)  Progress towards OT goals: Progressing toward goals  Acute Rehab OT Goals Patient Stated Goal: get out of here OT Goal Formulation: With patient Time For Goal Achievement: 01/01/22 Potential to Achieve Goals: Fair ADL Goals Pt Will Perform Grooming: with min guard assist;standing Pt Will Perform Upper Body Bathing: with min guard assist;sitting Pt Will Transfer to Toilet: with min assist;ambulating;grab bars Pt/caregiver will Perform Home Exercise Program: Increased ROM;Increased strength;Right Upper extremity;With minimal assist;With written HEP provided  Plan Discharge plan needs to be updated    Co-evaluation                 AM-PAC OT "6 Clicks" Daily Activity     Outcome Measure   Help from another person eating meals?: A Little Help from another person taking care of personal grooming?: A  Little Help from another person toileting, which includes using toliet, bedpan, or urinal?: A Little Help from another person bathing (including washing, rinsing, drying)?: A Lot Help from another person to put on and taking off regular upper body clothing?: A Little Help from another person to put on and taking off regular lower body clothing?: A Lot 6 Click Score: 16    End of Session Equipment Utilized During Treatment: Gait belt;Rolling walker (2 wheels)  OT Visit Diagnosis: Unsteadiness on feet (R26.81);Muscle weakness (generalized) (M62.81);Hemiplegia and hemiparesis;Low vision, both eyes (H54.2);Other symptoms and signs involving cognitive function;Other symptoms and signs involving the nervous system (R29.898) Hemiplegia - Right/Left: Right Hemiplegia - dominant/non-dominant: Dominant Hemiplegia - caused by: Other Nontraumatic intracranial hemorrhage   Activity Tolerance Patient tolerated treatment well   Patient Left Other (comment) (with mobility specialist in the hallway)   Nurse Communication Mobility status        Time: 1145-1200 OT Time Calculation (min): 15 min  Charges: OT General Charges $OT Visit: 1 Visit OT Treatments $Self Care/Home Management : 8-22 mins  Pollyann Glen E. Allen [place],  OTR/L Acute Rehabilitation Services [phone]   Cherlyn Cushing 12/18/2021, 1:05 PM

## 2021-12-18 NOTE — Progress Notes (Signed)
Mobility Specialist: Progress Note   12/18/21 1208  Mobility  Activity Ambulated with assistance in hallway  Level of Assistance Minimal assist, patient does 75% or more  Assistive Device Front wheel walker  Distance Ambulated (ft) 200 ft  Activity Response Tolerated well  Mobility Referral Yes  $Mobility charge 1 Mobility   Pt received in the bed and agreeable to mobility. MinA with bed mobility and contact guard to stand. Verbal cues for upright posture, RW management and proximity throughout. Pt to BR after session per request. Instructed to pull call string when finished to bed assisted back to bed.   Brent Warren Mobility Specialist Please contact via SecureChat or Rehab office at [phone]

## 2021-12-18 NOTE — TOC Progression Note (Signed)
Transition of Care [facility]) - Progression Note    Patient Details  Name: Brent Warren MRN: [id number] Date of Birth: 04-Jan-1961  [facility] of [facility]) CM/SW Contact  Tory Emerald, [place] Phone Number: 12/18/2021, 4:51 PM  Clinical Narrative:    CSW has consulted with Eunice Blase at [facility] regarding authorization. Currently waiting for auth.    Expected Discharge Plan: Skilled Nursing Facility Barriers to Discharge: Continued Medical Work up, No SNF bed, SNF Pending transportation  Expected Discharge Plan and Services Expected Discharge Plan: Skilled Nursing Facility     Post Acute Care Choice: Skilled Nursing Facility Living arrangements for the past 2 months: Single Family Home Expected Discharge Date: 12/18/21                                     Social Determinants of Health (SDOH) Interventions    Readmission Risk Interventions     No data to display

## 2021-12-18 NOTE — Progress Notes (Signed)
Speech Language Pathology Treatment: Cognitive-Linquistic  Patient Details Name: Tyjae Issa MRN: [id number] DOB: 02/19/1960 Today's Date: 12/18/2021 Time: 4098-1191 SLP Time Calculation (min) (ACUTE ONLY): 25 min  Assessment / Plan / Recommendation Clinical Impression  Patient seen by SLP for skilled treatment session focused on speech and cognitive goals. Patient was awake, alert and speech sounding significantly more clear than when this SLP had first worked with him 2.5 weeks ago. At that time, speech intelligibility was judged by SLP to be 25% or less. Currently, patient's speech is approximately 80-85% intelligible to trained listener at conversational level. Patient was aware of planned discharge to rehab facility today and he told SLP his goals were to get his right hand/arm working better, because "Im right handed". He was pleased that it had been approximately one month sober and one month clean from tobacco. He would like to continue this and he did tell SLP that his brother who he lives with "doesn't smoke, doesn't drink". Patient is demonstrating improved overall awareness, "this is what happens when you drink everyday" and appears to be motivated to work towards goals of improving his independence. SLP will continue to follow patient while here.   HPI HPI: 61 year old male with history of HTN, tobacco use, EtOH use who comes into the hospital with sudden onset slurred speech and right-sided weakness.  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   He started to develop increased withdrawal symptoms on 11/13      SLP Plan  Continue with current plan of care      Recommendations for follow up therapy are one component of a multi-disciplinary discharge planning process, led by the attending physician.  Recommendations may be updated based on patient status, additional functional criteria and insurance authorization.    Recommendations                  Oral Care  Recommendations: Oral care BID Follow Up Recommendations: Skilled nursing-short term rehab (<3 hours/day) Assistance recommended at discharge: Frequent or constant Supervision/Assistance SLP Visit Diagnosis: Cognitive communication deficit (R41.841);Dysarthria and anarthria (R47.1) Plan: Continue with current plan of care          Angela Nevin, MA, CCC-SLP Speech Therapy

## 2021-12-19 LAB — GLUCOSE, CAPILLARY
Glucose-Capillary: 111 mg/dL — ABNORMAL HIGH (ref 70–99)
Glucose-Capillary: 119 mg/dL — ABNORMAL HIGH (ref 70–99)
Glucose-Capillary: 120 mg/dL — ABNORMAL HIGH (ref 70–99)
Glucose-Capillary: 110 mg/dL — ABNORMAL HIGH (ref 70–99)

## 2021-12-19 NOTE — Discharge Summary (Signed)
[facility]  Physician Discharge Summary   Patient ID: Brent Warren MRN: [id number] DOB/AGE: [birth year] 61 y.o.  Admit date: 11/18/2021 Discharge date:   12/19/2021   PCP: Ladell Pier, MD  DISCHARGE DIAGNOSES:    ICH (intracerebral hemorrhage) ([place])   Hyponatremia   HTN (hypertension)   Pulmonary emphysema (HCC)   Tobacco abuse   ETOH abuse   Alcohol withdrawal delirium ([place])    Protein-calorie malnutrition, severe   RECOMMENDATIONS FOR OUTPATIENT FOLLOW UP: Patient needs outpatient follow-up with neurology CBC and complete metabolic panel and magnesium level every week   Home Health: Going to SNF Equipment/Devices: None  CODE STATUS: Full code  DISCHARGE CONDITION: fair  Diet recommendation: Heart healthy  INITIAL HISTORY: 61 year old male with history of HTN, tobacco use, chronic alcohol use presented with sudden onset slurred speech and right-sided weakness.  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  He was admitted to the neuro ICU, remained stable and transferred to the hospitalist service on 11/13.  Patient also developed alcohol withdrawal during this hospitalization.     HOSPITAL COURSE:   Left basal ganglia ICH Neurology was following and signed off on 11/24/2021 and recommended outpatient follow-up with neurology Echo showed EF of 60 to 65%.  LDL 62, A1c is 5.1 PT/OT recommending CIR.  As per CIR, patient is not a good candidate for CIR.   Patient to go to skilled nursing facility.   Oropharyngeal dysphagia Initially was kept n.p.o. and started on nasogastric tube feedings.   Seen by speech therapy and diet was advanced.   Has had good caloric intake.  NG tube was removed on 12/3. Tolerating his diet.  Continue to encourage oral intake.   Essential hypertension Initially was on amlodipine and metoprolol.  Blood pressures improved and then started trending low.   Amlodipine dose was initially decreased.  Blood  pressure again noted to be borderline low.  Will discontinue it altogether.  Continue just metoprolol for now.     PSVT Telemetry showed brief episodes of supraventricular tachycardia on 11/23. Echocardiogram shows normal systolic function.   TSH noted to be 5.0 with a normal free T4 of 0.77. Patient was started on beta-blocker.  Telemetry showed improvement.  Subsequently taken off of telemetry.   Agitation Seems to be stable on Seroquel.  Has been off of restraints for several days now.   Alcohol abuse, alcohol withdrawal with delirium tremens Patient was noted to have significant alcohol withdrawal symptoms.  PCCM consulted and have signed off.  Completed phenobarbital taper.  Vitamin B1 normal Continue thiamine, folic acid and multivitamin   Hypokalemia/hyponatremia/hypomagnesemia Monitor electrolytes periodically.  On magnesium oxide.  Dose of magnesium oxide was decreased due to a magnesium level of 3.2.   Possible aspiration pneumonia/Possible UTI UA suggestive of UTI as well.  Cultures negative. Completed 5 days of Unasyn.   Respiratory status noted to be stable.     Vitamin B12 deficiency B12 177.  Folate 12.7.   Continue cyanocobalamine.     Macrocytic anemia Secondary to alcohol abuse as well as B12 deficiency. Ferritin noted to be 1074.  Iron 95.  TIBC 252.   Elevated LFTs AST and ALT are noted to be minimally elevated.  Possibly from alcohol use.  No recent imaging study of the hepatobiliary system noted in EMR.   Since levels are reasonably stable this can be pursued in the outpatient setting. Hepatitis panel is unremarkable.   Tobacco use, history of emphysema Continue nebs as  needed Respiratory status is stable.   Severe protein calorie malnutrition Nutrition Problem: Severe Malnutrition Etiology: social / environmental circumstances Signs/Symptoms: severe fat depletion, severe muscle depletion   Patient is stable.  Okay for discharge to  SNF.   PERTINENT LABS:  The results of significant diagnostics from this hospitalization (including imaging, microbiology, ancillary and laboratory) are listed below for reference.     Labs:   Basic Metabolic Panel: Recent Labs  Lab 12/14/21 0602 12/16/21 0310  NA 135 135  K 3.7 4.0  CL 105 102  CO2 20* 21*  GLUCOSE 105* 102*  BUN 13 16  CREATININE 0.76 0.75  CALCIUM 9.4 9.3  MG 1.6* 3.2*    Liver Function Tests: Recent Labs  Lab 12/14/21 0602 12/16/21 0310  AST 39 39  ALT 51* 48*  ALKPHOS 145* 147*  BILITOT 0.5 0.3  PROT 7.5 6.8  ALBUMIN 3.0* 2.8*     CBC: Recent Labs  Lab 12/14/21 0602  WBC 12.1*  HGB 12.7*  HCT 35.2*  MCV 100.9*  PLT 349      CBG: Recent Labs  Lab 12/18/21 0626 12/18/21 1228 12/18/21 1651 12/18/21 2123 12/19/21 0616  GLUCAP 104* 104* 127* 112* 111*      IMAGING STUDIES DG Swallowing Func-Speech Pathology  Result Date: 11/28/2021 Table formatting from the original result was not included. Images from the original result were not included. Objective Swallowing Evaluation: Type of Study: MBS-Modified Barium Swallow Study  Patient Details Name: Brent Warren MRN: [id number] Date of Birth: 06/08/1960 Today's Date: 11/28/2021 Time: SLP Start Time (ACUTE ONLY): 6 -SLP Stop Time (ACUTE ONLY): 1045 SLP Time Calculation (min) (ACUTE ONLY): 25 min Past Medical History: Past Medical History: Diagnosis Date  Alcoholic ([place])   Chest pain 04/02/2017  COPD (chronic obstructive pulmonary disease) ([place])   Empyema of pleural space ([place]) 05/2015  ETOH abuse 11/06/2016  Hepatitis 05/22/2015  HTN (hypertension)   Immunization due 11/06/2016  Lung nodule, multiple 11/08/2015  Malnutrition of moderate degree 06/02/2015  Pneumonia 05/2015  Pulmonary emphysema ([place]) 11/06/2016  Tobacco abuse   Urge incontinence 11/06/2016 Past Surgical History: Past Surgical History: Procedure Laterality Date  arm surgery    DECORTICATION Right 06/03/2015  Procedure:  DECORTICATION;  Surgeon: Melrose Nakayama, MD;  Location: [facility];  Service: Thoracic;  Laterality: Right;  VIDEO ASSISTED THORACOSCOPY (VATS)/EMPYEMA Right 06/03/2015  Procedure: VIDEO ASSISTED THORACOSCOPY (VATS)/EMPYEMA;  Surgeon: Melrose Nakayama, MD;  Location: [facility] OR;  Service: Thoracic;  Laterality: Right; HPI: 61 year old male with history of HTN, tobacco use, EtOH use who comes into the hospital with sudden onset slurred speech and right-sided weakness.  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   He started to develop increased withdrawal symptoms on 11/13  No data recorded  Recommendations for follow up therapy are one component of a multi-disciplinary discharge planning process, led by the attending physician.  Recommendations may be updated based on patient status, additional functional criteria and insurance authorization. Assessment / Plan / Recommendation   11/28/2021  12:30 PM Clinical Impressions Clinical Impression Pt demonstrates mild oral dysphagia with slow lingual mobility, but adequate to clear most of the bolus from the oral cavity. Pt does have decreased bolus cohesion, contributing to premature spillage and delayed swallowing present with all trials. No penetration or aspiration occurred. No residue, no significant weakness. 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 Thin liquids shold  be toelrated if pt is sitting upright. Pills can be given whole in puree. Will f/u for tolerance.     11/28/2021  12:30 PM Treatment Recommendations Treatment Recommendations Therapy as outlined in treatment plan below     11/28/2021  12:30 PM Prognosis Prognosis for Safe Diet Advancement Good   11/28/2021  12:30 PM Diet Recommendations SLP Diet Recommendations Dysphagia 2 (Fine chop) solids;Thin liquid Liquid Administration via Cup;Straw Medication Administration Whole meds with puree Compensations Slow  rate;Small sips/bites Postural Changes Remain semi-upright after after feeds/meals (Comment);Seated upright at 90 degrees     11/28/2021  12:30 PM Other Recommendations Oral Care Recommendations Oral care BID Follow Up Recommendations Acute inpatient rehab (3hours/day)   11/28/2021  12:30 PM Frequency and Duration  Speech Therapy Frequency (ACUTE ONLY) min 2x/week Treatment Duration 2 weeks     11/28/2021  12:30 PM Oral Phase Oral Phase Impaired Oral - Nectar Cup Decreased bolus cohesion Oral - Nectar Straw Decreased bolus cohesion Oral - Thin Straw Decreased bolus cohesion Oral - Mech Soft Decreased bolus cohesion Oral - Pill [facility]    11/28/2021  12:30 PM Pharyngeal Phase Pharyngeal Phase Impaired Pharyngeal- Nectar Cup Delayed swallow initiation-pyriform sinuses Pharyngeal- Nectar Straw Delayed swallow initiation-pyriform sinuses Pharyngeal- Thin Straw Delayed swallow initiation-pyriform sinuses Pharyngeal- Puree Delayed swallow initiation-vallecula Pharyngeal- Mechanical Soft Delayed swallow initiation-vallecula     No data to display    DeBlois, Katherene Ponto 11/28/2021, 1:06 PM                     DG CHEST PORT 1 VIEW  Result Date: 11/24/2021 CLINICAL DATA:  Fever EXAM: PORTABLE CHEST 1 VIEW COMPARISON:  Chest radiograph done on 06/16/2016, CT chest done on 10/20/2020 FINDINGS: Cardiac size is within normal limits. Distal portion of enteric tube is seen in the stomach. Subtle increased interstitial markings are seen in left lower lung field. There is no pleural effusion or pneumothorax. IMPRESSION: Increased interstitial markings in the left lower lung fields suggest interstitial pneumonia. Electronically Signed   By: Elmer Picker M.D.   On: 11/24/2021 12:04   EEG adult  Result Date: 11/22/2021 Lora Havens, MD     11/22/2021  9:31 PM Patient Name: Brent Warren MRN: [id number] Epilepsy Attending: Lora Havens Referring Physician/Provider: Rosalin Hawking, MD Date: 11/22/2021 Duration: 22.28  mins Patient history: 61 year old male with left basal ganglia hemorrhage and altered mental status.  EEG to evaluate for seizure. Level of alertness: Awake AEDs during EEG study: Phenobarb Technical aspects: This EEG study was done with scalp electrodes positioned according to the 10-20 International system of electrode placement. Electrical activity was reviewed with band pass filter of 1-70Hz , sensitivity of 7 uV/mm, display speed of 60mm/sec with a 60Hz  notched filter applied as appropriate. EEG data were recorded continuously and digitally stored.  Video monitoring was available and reviewed as appropriate. Description: EEG showed continuous generalized 3 to 6 Hz theta-delta slowing admixed with an excessive amount of 15 to 18 Hz beta activity distributed symmetrically and diffusely. Hyperventilation and photic stimulation were not performed.   ABNORMALITY - Continuous slow, generalized - Excessive beta, generalized IMPRESSION: This study is suggestive of moderate to severe diffuse encephalopathy, nonspecific etiology. No seizures or epileptiform discharges were seen throughout the recording. Lora Havens   DG Abd Portable 1V  Result Date: 11/22/2021 CLINICAL DATA:  NG tube placement EXAM: PORTABLE ABDOMEN - 1 VIEW COMPARISON:  None Available. FINDINGS: Enteric tube tip projects over the stomach. No focal consolidation in  the partially imaged lung bases. Non dilated bowel loops in the upper abdomen. IMPRESSION: Enteric tube tip projects over the stomach. Electronically Signed   By: Darrin Nipper M.D.   On: 11/22/2021 11:14   CT HEAD WO CONTRAST (5MM)  Result Date: 11/20/2021 CLINICAL DATA:  Altered mental status, intraparenchymal hematoma. EXAM: CT HEAD WITHOUT CONTRAST TECHNIQUE: Contiguous axial images were obtained from the base of the skull through the vertex without intravenous contrast. RADIATION DOSE REDUCTION: This exam was performed according to the departmental dose-optimization program  which includes automated exposure control, adjustment of the mA and/or kV according to patient size and/or use of iterative reconstruction technique. COMPARISON:  CT head 2 days prior. FINDINGS: Brain: The 2.2 cm x 1.7 cm x 1.7 cm intraparenchymal hematoma centered in the left basal ganglia is unchanged in size and appearance. There is mild surrounding edema without mass effect or midline shift. There is no new acute intracranial hemorrhage or extra-axial fluid collection. There is no acute territorial infarct. Parenchymal volume is stable. The ventricles are stable in size. There is no mass lesion. Vascular: No hyperdense vessel or unexpected calcification. Skull: Normal. Negative for fracture or focal lesion. Sinuses/Orbits: The imaged paranasal sinuses are clear. The globes and orbits are unremarkable. Other: None. IMPRESSION: Unchanged intraparenchymal hematoma centered in the left basal ganglia. No new acute intracranial pathology. Electronically Signed   By: Valetta Mole M.D.   On: 11/20/2021 15:31   ECHOCARDIOGRAM COMPLETE  Result Date: 11/19/2021    ECHOCARDIOGRAM REPORT   Patient Name:   Brent Warren Date of Exam: 11/19/2021 Medical Rec #:  [id number]   Height:       69.0 in Accession #:    [id number]  Weight:       130.5 lb Date of Birth:  May 30, 1960    BSA:          1.723 m Patient Age:    89 years    BP:           116/100 mmHg Patient Gender: M           HR:           66 bpm. Exam Location:  Inpatient Procedure: 2D Echo, Cardiac Doppler, Color Doppler and Intracardiac            Opacification Agent Indications:    Stroke  History:        Patient has prior history of Echocardiogram examinations, most                 recent 05/02/2017. Risk Factors:Hypertension and Current Smoker.                 ETOH abuse.  Sonographer:    Clayton Lefort RDCS (AE) Referring Phys: [facility]  Sonographer Comments: Technically difficult study due to poor echo windows, suboptimal parasternal window, suboptimal apical  window and suboptimal subcostal window. IMPRESSIONS  1. Left ventricular ejection fraction, by estimation, is 60 to 65%. The left ventricle has normal function. The left ventricle has no regional wall motion abnormalities. Left ventricular diastolic parameters are consistent with Grade I diastolic dysfunction (impaired relaxation).  2. Right ventricular systolic function is normal. The right ventricular size is normal. Tricuspid regurgitation signal is inadequate for assessing PA pressure.  3. The mitral valve is grossly normal. No evidence of mitral valve regurgitation.  4. The aortic valve was not well visualized. Aortic valve regurgitation is not visualized.  5. The inferior vena cava is normal in size with  greater than 50% respiratory variability, suggesting right atrial pressure of 3 mmHg. Comparison(s): Changes from prior study are noted. 05/02/2017 (stress echo) - negative for ischemia, LVEF 50-55% at baseline. FINDINGS  Left Ventricle: Left ventricular ejection fraction, by estimation, is 60 to 65%. The left ventricle has normal function. The left ventricle has no regional wall motion abnormalities. Definity contrast agent was given IV to delineate the left ventricular  endocardial borders. The left ventricular internal cavity size was normal in size. There is no left ventricular hypertrophy. Left ventricular diastolic parameters are consistent with Grade I diastolic dysfunction (impaired relaxation). Indeterminate filling pressures. Right Ventricle: The right ventricular size is normal. No increase in right ventricular wall thickness. Right ventricular systolic function is normal. Tricuspid regurgitation signal is inadequate for assessing PA pressure. Left Atrium: Left atrial size was normal in size. Right Atrium: Right atrial size was normal in size. Pericardium: There is no evidence of pericardial effusion. Mitral Valve: The mitral valve is grossly normal. No evidence of mitral valve regurgitation.  Tricuspid Valve: The tricuspid valve is not well visualized. Tricuspid valve regurgitation is not demonstrated. Aortic Valve: The aortic valve was not well visualized. Aortic valve regurgitation is not visualized. Aortic valve mean gradient measures 2.0 mmHg. Aortic valve peak gradient measures 4.4 mmHg. Pulmonic Valve: The pulmonic valve was not well visualized. Pulmonic valve regurgitation is not visualized. Aorta: The aortic root and ascending aorta are structurally normal, with no evidence of dilitation. Venous: The inferior vena cava is normal in size with greater than 50% respiratory variability, suggesting right atrial pressure of 3 mmHg. IAS/Shunts: The interatrial septum appears to be lipomatous. No atrial level shunt detected by color flow Doppler.   Diastology LV e' medial:    4.35 cm/s LV E/e' medial:  12.6 LV e' lateral:   7.51 cm/s LV E/e' lateral: 7.3  RIGHT VENTRICLE             IVC RV Basal diam:  3.30 cm     IVC diam: 1.40 cm RV S prime:     13.50 cm/s TAPSE (M-mode): 1.5 cm LEFT ATRIUM           Index       RIGHT ATRIUM           Index LA Vol (A4C): 15.8 ml 9.17 ml/m  RA Area:     13.20 cm                                   RA Volume:   34.30 ml  19.90 ml/m  AORTIC VALVE AV Vmax:           105.00 cm/s AV Vmean:          69.800 cm/s AV VTI:            0.198 m AV Peak Grad:      4.4 mmHg AV Mean Grad:      2.0 mmHg LVOT Vmax:         92.50 cm/s LVOT Vmean:        56.200 cm/s LVOT VTI:          0.157 m LVOT/AV VTI ratio: 0.79 MITRAL VALVE MV Area (PHT): 2.99 cm    SHUNTS MV Decel Time: 254 msec    Systemic VTI: 0.16 m MV E velocity: 54.80 cm/s MV A velocity: 74.80 cm/s MV E/A ratio:  0.73 Lyman Bishop MD Electronically signed by Chrissie Noa  Hilty MD Signature Date/Time: 11/19/2021/2:04:56 PM    Final     DISCHARGE EXAMINATION: Vitals:   12/18/21 1151 12/18/21 1545 12/18/21 2057 12/19/21 0727  BP: 137/89 122/77 121/82 129/85  Pulse: 85 82 85 79  Resp: 18 18 18 18   Temp: 98 F (36.7 C) 98.3 F  (36.8 C) 98.2 F (36.8 C) 98.5 F (36.9 C)  TempSrc: Oral Oral Oral Oral  SpO2: 93% 99% 97% 97%  Weight:      Height:       Awake alert.  In no distress. Lungs are clear to auscultation bilaterally S1-S2 is normal regular. Abdomen is soft.  Nontender nondistended.   DISPOSITION: SNF  Discharge Instructions     Ambulatory referral to Neurology   Complete by: As directed    Follow up with stroke clinic NP (Jessica Vanschaick or , if both not available, consider Darrol Angel, or [place]) at [facility] in about 4 weeks. Thanks.   Call MD for:  difficulty breathing, headache or visual disturbances   Complete by: As directed    Call MD for:  extreme fatigue   Complete by: As directed    Call MD for:  persistant dizziness or light-headedness   Complete by: As directed    Call MD for:  persistant nausea and vomiting   Complete by: As directed    Call MD for:  severe uncontrolled pain   Complete by: As directed    Call MD for:  temperature >100.4   Complete by: As directed    Diet - low sodium heart healthy   Complete by: As directed    Discharge instructions   Complete by: As directed    Please review instructions on the discharge summary.  You were cared for by a hospitalist during your hospital stay. If you have any questions about your discharge medications or the care you received while you were in the hospital after you are discharged, you can call the unit and asked to speak with the hospitalist on call if the hospitalist that took care of you is not available. Once you are discharged, your primary care physician will handle any further medical issues. Please note that NO REFILLS for any discharge medications will be authorized once you are discharged, as it is imperative that you return to your primary care physician (or establish a relationship with a primary care physician if you do not have one) for your aftercare needs so that they can reassess your need for  medications and monitor your lab values. If you do not have a primary care physician, you can call [phone] for a physician referral.   Increase activity slowly   Complete by: As directed    No wound care   Complete by: As directed           Allergies as of 12/19/2021   No Known Allergies      Medication List     STOP taking these medications    amLODipine 10 MG tablet Commonly known as: NORVASC   loperamide 2 MG tablet Commonly known as: Imodium A-D   meloxicam 15 MG tablet Commonly known as: MOBIC       TAKE these medications    acetaminophen 325 MG tablet Commonly known as: TYLENOL Take 2 tablets (650 mg total) by mouth every 4 (four) hours as needed for mild pain (or temp > 37.5 C (99.5 F)).   albuterol (2.5 MG/3ML) 0.083% nebulizer solution Commonly known as: PROVENTIL Take 3 mLs (2.5  mg total) by nebulization every 6 (six) hours as needed for wheezing or shortness of breath.   Ventolin HFA 108 (90 Base) MCG/ACT inhaler Generic drug: albuterol Inhale 2 puffs into the lungs every 6 (six) hours as needed for wheezing or shortness of breath.   cyanocobalamin 1000 MCG tablet Take 1 tablet (1,000 mcg total) by mouth daily.   feeding supplement Liqd Take 237 mLs by mouth 3 (three) times daily between meals.   folic acid 1 MG tablet Commonly known as: FOLVITE Take 1 tablet (1 mg total) by mouth daily.   magnesium oxide 400 (240 Mg) MG tablet Commonly known as: MAG-OX Take 1 tablet (400 mg total) by mouth daily.   metoprolol tartrate 50 MG tablet Commonly known as: LOPRESSOR Take 1 tablet (50 mg total) by mouth 2 (two) times daily.   multivitamin with minerals Tabs tablet Take 1 tablet by mouth daily.   pantoprazole 40 MG tablet Commonly known as: PROTONIX Take 1 tablet (40 mg total) by mouth daily.   polyethylene glycol 17 g packet Commonly known as: MIRALAX / GLYCOLAX Take 17 g by mouth daily.   QUEtiapine 50 MG tablet Commonly known as:  SEROQUEL Take 1 tablet (50 mg total) by mouth 2 (two) times daily.   senna-docusate 8.6-50 MG tablet Commonly known as: Senokot-S Take 2 tablets by mouth 2 (two) times daily.   Symbicort 80-4.5 MCG/ACT inhaler Generic drug: budesonide-formoterol Inhale 2 puffs into the lungs 2 (two) times daily   thiamine 100 MG tablet Commonly known as: Vitamin B-1 Take 1 tablet (100 mg total) by mouth daily.          Follow-up Information     [facility]. Schedule an appointment as soon as possible for a visit in 1 month(s).   Specialty: Neurology Why: stroke clinic Contact information: [address] [place] [phone]        Ladell Pier, MD. Schedule an appointment as soon as possible for a visit.   Specialty: Internal Medicine Why: post hospitalization follow up Contact information: [place] [address] [address] [phone]                 TOTAL DISCHARGE TIME: 76 minutes  [place]  [facility] Pager on [email or web address]  12/19/2021, 9:17 AM

## 2021-12-19 NOTE — TOC Progression Note (Addendum)
Transition of Care [facility]) - Progression Note    Patient Details  Name: Brent Warren MRN: [id number] Date of Birth: Mar 05, 1960  Transition of Care [facility] CM/SW Contact  Tory Emerald, [place] Phone Number: 12/19/2021, 8:37 AM  Clinical Narrative:    CSW attempted to contact Debbie at [facility]; no answer. CSW also sent a text to inquire about status of auth. CSW received returned call from Debbie reporting [facility] is under medical review.     Expected Discharge Plan: Skilled Nursing Facility Barriers to Discharge: Continued Medical Work up, No SNF bed, SNF Pending transportation  Expected Discharge Plan and Services Expected Discharge Plan: Skilled Nursing Facility     Post Acute Care Choice: Skilled Nursing Facility Living arrangements for the past 2 months: Single Family Home Expected Discharge Date: 12/18/21                                     Social Determinants of Health (SDOH) Interventions    Readmission Risk Interventions     No data to display

## 2021-12-19 NOTE — Plan of Care (Signed)
  Problem: Education: Goal: Knowledge of disease or condition will improve Outcome: Progressing Goal: Knowledge of secondary prevention will improve (MUST DOCUMENT ALL) Outcome: Progressing Goal: Knowledge of patient specific risk factors will improve (Mark N/A or DELETE if not current risk factor) Outcome: Progressing   Problem: Coping: Goal: Will verbalize positive feelings about self Outcome: Progressing Goal: Will identify appropriate support needs Outcome: Progressing   Problem: Health Behavior/Discharge Planning: Goal: Ability to manage health-related needs will improve Outcome: Progressing Goal: Goals will be collaboratively established with patient/family Outcome: Progressing   Problem: Self-Care: Goal: Ability to participate in self-care as condition permits will improve Outcome: Progressing Goal: Verbalization of feelings and concerns over difficulty with self-care will improve Outcome: Progressing Goal: Ability to communicate needs accurately will improve Outcome: Progressing   Problem: Nutrition: Goal: Risk of aspiration will decrease Outcome: Progressing Goal: Dietary intake will improve Outcome: Progressing   Problem: Education: Goal: Knowledge of General Education information will improve Description: Including pain rating scale, medication(s)/side effects and non-pharmacologic comfort measures Outcome: Progressing   Problem: Health Behavior/Discharge Planning: Goal: Ability to manage health-related needs will improve Outcome: Progressing   Problem: Clinical Measurements: Goal: Will remain free from infection Outcome: Progressing Goal: Diagnostic test results will improve Outcome: Progressing Goal: Respiratory complications will improve Outcome: Progressing Goal: Cardiovascular complication will be avoided Outcome: Progressing   Problem: Activity: Goal: Risk for activity intolerance will decrease Outcome: Progressing   Problem: Nutrition: Goal:  Adequate nutrition will be maintained Outcome: Progressing   Problem: Safety: Goal: Ability to remain free from injury will improve Outcome: Progressing   Problem: Skin Integrity: Goal: Risk for impaired skin integrity will decrease Outcome: Progressing   Problem: Education: Goal: Ability to describe self-care measures that may prevent or decrease complications (Diabetes Survival Skills Education) will improve Outcome: Progressing Goal: Individualized Educational Video(s) Outcome: Progressing   Problem: Fluid Volume: Goal: Ability to maintain a balanced intake and output will improve Outcome: Progressing   Problem: Health Behavior/Discharge Planning: Goal: Ability to identify and utilize available resources and services will improve Outcome: Progressing Goal: Ability to manage health-related needs will improve Outcome: Progressing   Problem: Metabolic: Goal: Ability to maintain appropriate glucose levels will improve Outcome: Progressing   Problem: Nutritional: Goal: Maintenance of adequate nutrition will improve Outcome: Progressing Goal: Progress toward achieving an optimal weight will improve Outcome: Progressing   Problem: Skin Integrity: Goal: Risk for impaired skin integrity will decrease Outcome: Progressing   

## 2021-12-19 NOTE — Progress Notes (Signed)
Physical Therapy Treatment Patient Details Name: Brent Warren MRN: [id number] DOB: Aug 23, 1960 Today's Date: 12/19/2021   History of Present Illness 61 yo male presenting 11/11 with R-sided weakness, facial droop and slurred speech. 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 PMH includes: HTN, COPD, and alcohol abuse.    PT Comments    Pt with continued progress towards acute goals with significantly increased tolerance for gait with RW this date at min guard level with no overt LOB, however pt limited by ataxia/jerky movements, impaired balance/postural reactions and decreased safety awareness needing cues throughout for RW proximity, keeping R hand on RW and upright posture. Pt with noted improvement in speech this date, able to converse throughout session. Pt continues to benefit from skilled PT services to progress toward functional mobility goals.    Recommendations for follow up therapy are one component of a multi-disciplinary discharge planning process, led by the attending physician.  Recommendations may be updated based on patient status, additional functional criteria and insurance authorization.  Follow Up Recommendations  Acute inpatient rehab (3hours/day)     Assistance Recommended at Discharge Frequent or constant Supervision/Assistance  Patient can return home with the following A lot of help with walking and/or transfers;A lot of help with bathing/dressing/bathroom;Assistance with cooking/housework;Direct supervision/assist for medications management;Direct supervision/assist for financial management;Assist for transportation;Help with stairs or ramp for entrance   Equipment Recommendations  Other (comment)    Recommendations for Other Services       Precautions / Restrictions Precautions Precautions: Fall Precaution Comments: watch HR Restrictions Weight Bearing Restrictions: No     Mobility  Bed Mobility Overal bed mobility: Needs Assistance Bed Mobility:  Supine to Sit, Sit to Supine     Supine to sit: Min guard Sit to supine: Min guard   General bed mobility comments: min guard for safety    Transfers Overall transfer level: Needs assistance Equipment used: Rolling walker (2 wheels) Transfers: Sit to/from Stand Sit to Stand: Min assist           General transfer comment: min A to steady on rise and to assist R hand to RW    Ambulation/Gait Ambulation/Gait assistance: Min guard, Min assist Gait Distance (Feet): 125 Feet Assistive device: Rolling walker (2 wheels) Gait Pattern/deviations: Step-through pattern, Decreased stride length, Trunk flexed Gait velocity: decr     General Gait Details: cues throughout for RW proximity and upright posture, pt with some ataxia/ jerky movements, but no LOB noted   Stairs             Wheelchair Mobility    Modified Rankin (Stroke Patients Only) Modified Rankin (Stroke Patients Only) Pre-Morbid Rankin Score: No symptoms Modified Rankin: Moderately severe disability     Balance Overall balance assessment: Needs assistance Sitting-balance support: Feet supported, No upper extremity supported Sitting balance-Leahy Scale: Fair     Standing balance support: Bilateral upper extremity supported, During functional activity Standing balance-Leahy Scale: Poor Standing balance comment: reliant on external assist                            Cognition Arousal/Alertness: Awake/alert Behavior During Therapy: Restless Overall Cognitive Status: Difficult to assess Area of Impairment: Following commands, Attention, Problem solving, Awareness, Safety/judgement                   Current Attention Level: Focused, Sustained   Following Commands: Follows one step commands with increased time Safety/Judgement: Decreased awareness of safety, Decreased  awareness of deficits Awareness: Intellectual Problem Solving: Slow processing General Comments: perseverating on  needing haircut and how he looks, redirectable and particapatory, speech quality and inteligibility much improved, making conversation throughout        Exercises      General Comments General comments (skin integrity, edema, etc.): VSS on RA      Pertinent Vitals/Pain Pain Assessment Pain Assessment: No/denies pain    Home Living                          Prior Function            PT Goals (current goals can now be found in the care plan section) Acute Rehab PT Goals Patient Stated Goal: get a haircut and a shower PT Goal Formulation: With patient Time For Goal Achievement: 12/03/21 Progress towards PT goals: Progressing toward goals    Frequency    Min 3X/week      PT Plan      Co-evaluation              AM-PAC PT "6 Clicks" Mobility   Outcome Measure  Help needed turning from your back to your side while in a flat bed without using bedrails?: A Lot Help needed moving from lying on your back to sitting on the side of a flat bed without using bedrails?: A Lot Help needed moving to and from a bed to a chair (including a wheelchair)?: A Lot Help needed standing up from a chair using your arms (e.g., wheelchair or bedside chair)?: A Lot Help needed to walk in hospital room?: A Lot Help needed climbing 3-5 steps with a railing? : A Lot 6 Click Score: 12    End of Session Equipment Utilized During Treatment: Gait belt Activity Tolerance: Patient tolerated treatment well Patient left: in bed;with call bell/phone within reach;with bed alarm set Nurse Communication: Mobility status PT Visit Diagnosis: Other abnormalities of gait and mobility (R26.89);Muscle weakness (generalized) (M62.81);Hemiplegia and hemiparesis Hemiplegia - Right/Left: Right Hemiplegia - dominant/non-dominant: Dominant Hemiplegia - caused by: Nontraumatic intracerebral hemorrhage     Time: 0165-5374 PT Time Calculation (min) (ACUTE ONLY): 24 min  Charges:  $Gait Training:  8-22 mins $Therapeutic Activity: 8-22 mins                     Giulia Hickey R. PTA Acute Rehabilitation Services Office: [phone]   Catalina Antigua 12/19/2021, 12:42 PM

## 2021-12-19 NOTE — Progress Notes (Signed)
Mobility Specialist: Progress Note   12/19/21 1641  Mobility  Activity Ambulated with assistance in hallway  Level of Assistance Minimal assist, patient does 75% or more  Assistive Device Front wheel walker  Distance Ambulated (ft) 200 ft  Activity Response Tolerated well  Mobility Referral Yes  $Mobility charge 1 Mobility   Pt received in the BR and agreeable to mobility. No c/o throughout. Pt back to bed after session with call bell and phone in reach. Bed alarm is on.   Brent Warren Mobility Specialist Please contact via SecureChat or Rehab office at [phone]

## 2021-12-20 DIAGNOSIS — E43 Unspecified severe protein-calorie malnutrition: Secondary | ICD-10-CM | POA: Diagnosis not present

## 2021-12-20 DIAGNOSIS — F10931 Alcohol use, unspecified with withdrawal delirium: Secondary | ICD-10-CM | POA: Diagnosis not present

## 2021-12-20 DIAGNOSIS — J439 Emphysema, unspecified: Secondary | ICD-10-CM | POA: Diagnosis not present

## 2021-12-20 DIAGNOSIS — R718 Other abnormality of red blood cells: Secondary | ICD-10-CM | POA: Diagnosis not present

## 2021-12-20 DIAGNOSIS — I610 Nontraumatic intracerebral hemorrhage in hemisphere, subcortical: Secondary | ICD-10-CM | POA: Diagnosis not present

## 2021-12-20 DIAGNOSIS — E871 Hypo-osmolality and hyponatremia: Secondary | ICD-10-CM | POA: Diagnosis not present

## 2021-12-20 DIAGNOSIS — I10 Essential (primary) hypertension: Secondary | ICD-10-CM | POA: Diagnosis not present

## 2021-12-20 LAB — GLUCOSE, CAPILLARY
Glucose-Capillary: 132 mg/dL — ABNORMAL HIGH (ref 70–99)
Glucose-Capillary: 103 mg/dL — ABNORMAL HIGH (ref 70–99)
Glucose-Capillary: 122 mg/dL — ABNORMAL HIGH (ref 70–99)

## 2021-12-20 NOTE — Progress Notes (Signed)
Pt taken per bed with all belongings including 3W rolling walker (Nurse on 2W stated they didn't have one). Pt left  per bed in stable condition for transfer to 2W Rm 10.

## 2021-12-20 NOTE — Progress Notes (Signed)
Mobility Specialist: Progress Note   12/20/21 1059  Mobility  Activity Ambulated with assistance in hallway  Level of Assistance Minimal assist, patient does 75% or more  Assistive Device Front wheel walker  Distance Ambulated (ft) 150 ft  Activity Response Tolerated well  Mobility Referral Yes  $Mobility charge 1 Mobility   Pt received in the bed and agreeable to mobility. MinA with bed mobility and contact guard during ambulation. STSx4, including to get standing weight, then hallway ambulation. Verbal cues for RW proximity and upright posture throughout. Pt back to bed after session with call bell and phone in reach. Bed alarm is on.   Brent Warren Mobility Specialist Please contact via SecureChat or Rehab office at [phone]

## 2021-12-20 NOTE — TOC Progression Note (Addendum)
Transition of Care [facility]) - Progression Note    Patient Details  Name: Brent Warren MRN: [id number] Date of Birth: 12-Jan-1960  [facility] CM/SW Contact  Tory Emerald, [place] Phone Number: 12/20/2021, 9:42 AM  Clinical Narrative:     CSW messaged Debbie at [facility] to inquire about status of auth.  CSW called facility to speak with Eunice Blase to inquire about status of authorization. Debbie reports the [facility] is still pending.  CSW and pt called [employer] together to inquire about the delay for authorization for SNF. [employer] reports authorization for SNF was just submitted on 12/19/21.   Expected Discharge Plan: Skilled Nursing Facility Barriers to Discharge: Continued Medical Work up, No SNF bed, SNF Pending transportation  Expected Discharge Plan and Services Expected Discharge Plan: Skilled Nursing Facility     Post Acute Care Choice: Skilled Nursing Facility Living arrangements for the past 2 months: Single Family Home Expected Discharge Date: 12/18/21                                     Social Determinants of Health (SDOH) Interventions    Readmission Risk Interventions     No data to display

## 2021-12-20 NOTE — Progress Notes (Signed)
Nutrition Follow-up  DOCUMENTATION CODES:  Severe malnutrition in context of social or environmental circumstances  INTERVENTION:  Continue current diet automatic trays Double portions Ensure Enlive 3x/d 350kcal and 20g protein 10,000IU of vitamin A x 2 weeks, 500 mg Vitamin C x 30 days, 220mg  zinc x 14d, 100mg  B6 x 2 weeks  NUTRITION DIAGNOSIS:  Severe Malnutrition related to social / environmental circumstances as evidenced by severe fat depletion, severe muscle depletion. - remains applicable  GOAL:  Patient will meet greater than or equal to 90% of their needs - progressing, diet and supplements in place  MONITOR:  Diet advancement, TF tolerance, Labs  REASON FOR ASSESSMENT:  Consult Calorie Count, Enteral/tube feeding initiation and management  ASSESSMENT:  61 y.o. male with PMH HTN, alcohol abuse, and COPD who presented after ICH.  11/13 SLP rec'd NPO 11/15 Cortrak placed, starting tube feeds 11/21 - MBS, DYS2, thins 11/24 - TF adjusted to nocturnal 11/27-11/29 - kcal count, very poor intake, not meeting needs 11/30 - adjust TF back to meet 100% of needs, bolus feeds requested by MD 12/3 - cortrak tube removed 12/7 - SLP upgraded to regular diet with thin liquids  Pt resting in bed at the time of assessment. Much more alert and oriented today. Pt reports he is "eating like a horse" and consumes all supplements brought to him. Pt looks overall improved. Mobility tech entered room to walk with pt and assisted with obtaining standing weight. Pt remains underweight, but weight increased from last recorded. Encouraged pt to continue eating all meals and supplements to continue replenishing weight and muscle mass.   Pt acknowledges that his alcoholism played a major role in his poor intake and over nutrition status. Discussed the vitamins he was receiving as deficiencies were suspected. Pt acknowledges that he needs to stop drinking. Reports a period of sobriety for 9 years  in the past.   Average Meal Intake: 11/21-11/27: 37% average intake x 3 recorded meals 11/28-12/7: 36% average intake x 12 recorded meals 12/8-12/13: 59% average intake x 7 recorded meals  Nutritionally Relevant Medications: Scheduled Meds:  ascorbic acid  500 mg Oral Daily   vitamin B-12  1,000 mcg Oral Daily   feeding supplement  237 mL Oral TID BM   folic acid  1 mg Oral Daily   magnesium oxide  400 mg Oral Daily   multivitamin with minerals  1 tablet Oral Daily   pantoprazole  40 mg Oral Daily   polyethylene glycol  17 g Oral Daily   pyridOXINE  100 mg Oral Daily   senna-docusate  2 tablet Oral BID   thiamine  100 mg Oral Daily   vitamin A  10,000 Units Oral Daily   zinc sulfate  220 mg Oral Daily   Labs Reviewed  NUTRITION - FOCUSED PHYSICAL EXAM: Flowsheet Row Most Recent Value  Orbital Region Severe depletion  Upper Arm Region Severe depletion  Thoracic and Lumbar Region Severe depletion  Buccal Region Moderate depletion  Temple Region Severe depletion  Clavicle Bone Region Severe depletion  Clavicle and Acromion Bone Region Severe depletion  Scapular Bone Region Unable to assess  Dorsal Hand Severe depletion  Patellar Region Severe depletion  Anterior Thigh Region Severe depletion  Posterior Calf Region Severe depletion  Edema (RD Assessment) None  Hair Reviewed  Eyes Reviewed  [Bitot's Spot]  Mouth Reviewed  [red, smooth]  Skin Reviewed  [seborrheic dermatitis, follicular hyperkeratosis]  Nails Reviewed    Diet Order:   Diet Order  Diet - low sodium heart healthy           Diet regular Room service appropriate? No; Fluid consistency: Thin  Diet effective now                   EDUCATION NEEDS:  Not appropriate for education at this time  Skin:  Skin Assessment: Reviewed RN Assessment  Last BM:  12/12 - type 1  Height:  Ht Readings from Last 1 Encounters:  11/22/21 5\' 9"  (1.753 m)    Weight:  Wt Readings from Last 1  Encounters:  12/20/21 52.3 kg    Ideal Body Weight:  72.7 kg  BMI:  Body mass index is 17.01 kg/m.  Estimated Nutritional Needs:  Kcal:  1900-2100 kcal/d Protein:  90-110 g/d Fluid:  1.9-2.1 L/d    12/22/21, RD, LDN Clinical Dietitian RD pager # available in [facility]  After hours/weekend pager # available in [facility]

## 2021-12-20 NOTE — Discharge Summary (Signed)
[facility]  Physician Discharge Summary   Patient ID: Brent Warren MRN: [id number] DOB/AGE: 62-Feb-1962 62 y.o.  Admit date: 11/18/2021 Discharge date:   12/20/2021   PCP: Marcine Matar, MD  DISCHARGE DIAGNOSES:    ICH (intracerebral hemorrhage) (HCC)   Hyponatremia   HTN (hypertension)   Pulmonary emphysema (HCC)   Tobacco abuse   ETOH abuse   Alcohol withdrawal delirium (HCC)    Protein-calorie malnutrition, severe   RECOMMENDATIONS FOR OUTPATIENT FOLLOW UP: Patient needs outpatient follow-up with neurology CBC and complete metabolic panel and magnesium level every week as diet progresses  Home Health: Going to SNF Equipment/Devices: None  CODE STATUS: Full code  DISCHARGE CONDITION: fair  Diet recommendation: Heart healthy  INITIAL HISTORY: 61 year old male with history of HTN, tobacco use, chronic alcohol use presented with sudden onset slurred speech and right-sided weakness.  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  He was admitted to the neuro ICU, remained stable and transferred to the hospitalist service on 11/13.  Patient also developed alcohol withdrawal during this hospitalization.   HOSPITAL COURSE:   Left basal ganglia ICH Neurology was following and signed off on 11/24/2021 and recommended outpatient follow-up with neurology Echo showed EF of 60 to 65%.  LDL 62, A1c is 5.1 PT/OT recommending CIR.  As per CIR, patient is not a good candidate for CIR.   Patient to go to skilled nursing facility.   Oropharyngeal dysphagia Initially was kept n.p.o. and started on nasogastric tube feedings.   Seen by speech therapy and diet was advanced.   Has had good caloric intake.  NG tube was removed on 12/3. Tolerating his diet.  Continue to encourage oral intake.   Essential hypertension Initially was on amlodipine and metoprolol.  Blood pressures improved and then started trending low.   Amlodipine dose was initially decreased.   Blood pressure again noted to be borderline low.  Will discontinue it altogether.  Continue just metoprolol for now.     PSVT Telemetry showed brief episodes of supraventricular tachycardia on 11/23. Echocardiogram shows normal systolic function.   TSH noted to be 5.0 with a normal free T4 of 0.77. Patient was started on beta-blocker.  Telemetry showed improvement.  Subsequently taken off of telemetry.   Agitation Seems to be stable on Seroquel.  Has been off of restraints for several days now.   Alcohol abuse, alcohol withdrawal with delirium tremens Patient was noted to have significant alcohol withdrawal symptoms.  PCCM consulted and have signed off.  Completed phenobarbital taper.  Vitamin B1 normal Continue thiamine, folic acid and multivitamin   Hypokalemia/hyponatremia/hypomagnesemia Monitor electrolytes periodically.  On magnesium oxide.  Dose of magnesium oxide was decreased due to a magnesium level of 3.2.   Possible aspiration pneumonia/Possible UTI UA suggestive of UTI as well.  Cultures negative. Completed 5 days of Unasyn.   Respiratory status noted to be stable.     Vitamin B12 deficiency B12 177.  Folate 12.7.   Continue cyanocobalamine.     Macrocytic anemia Secondary to alcohol abuse as well as B12 deficiency. Ferritin noted to be 1074.  Iron 95.  TIBC 252.   Elevated LFTs AST and ALT are noted to be minimally elevated.  Possibly from alcohol use.  No recent imaging study of the hepatobiliary system noted in EMR.   Since levels are reasonably stable this can be pursued in the outpatient setting. Hepatitis panel is unremarkable.   Tobacco use, history of emphysema Continue nebs as  needed Respiratory status is stable.   Severe protein calorie malnutrition Nutrition Problem: Severe Malnutrition Etiology: social / environmental circumstances Signs/Symptoms: severe fat depletion, severe muscle depletion   Patient is stable.  Okay for discharge to  SNF.   PERTINENT LABS:  The results of significant diagnostics from this hospitalization (including imaging, microbiology, ancillary and laboratory) are listed below for reference.     Labs:   Basic Metabolic Panel: Recent Labs  Lab 12/14/21 0602 12/16/21 0310  NA 135 135  K 3.7 4.0  CL 105 102  CO2 20* 21*  GLUCOSE 105* 102*  BUN 13 16  CREATININE 0.76 0.75  CALCIUM 9.4 9.3  MG 1.6* 3.2*    Liver Function Tests: Recent Labs  Lab 12/14/21 0602 12/16/21 0310  AST 39 39  ALT 51* 48*  ALKPHOS 145* 147*  BILITOT 0.5 0.3  PROT 7.5 6.8  ALBUMIN 3.0* 2.8*     CBC: Recent Labs  Lab 12/14/21 0602  WBC 12.1*  HGB 12.7*  HCT 35.2*  MCV 100.9*  PLT 349      CBG: Recent Labs  Lab 12/18/21 2123 12/19/21 0616 12/19/21 1125 12/19/21 1657 12/19/21 2139  GLUCAP 112* 111* 119* 120* 110*      IMAGING STUDIES DG Swallowing Func-Speech Pathology  Result Date: 11/28/2021 Table formatting from the original result was not included. Images from the original result were not included. Objective Swallowing Evaluation: Type of Study: MBS-Modified Barium Swallow Study  Patient Details Name: Brent Warren MRN: [id number] Date of Birth: 1960-06-29 Today's Date: 11/28/2021 Time: SLP Start Time (ACUTE ONLY): 77 -SLP Stop Time (ACUTE ONLY): 1045 SLP Time Calculation (min) (ACUTE ONLY): 25 min Past Medical History: Past Medical History: Diagnosis Date  Alcoholic ([place])   Chest pain 04/02/2017  COPD (chronic obstructive pulmonary disease) ([place])   Empyema of pleural space ([place]) 05/2015  ETOH abuse 11/06/2016  Hepatitis 05/22/2015  HTN (hypertension)   Immunization due 11/06/2016  Lung nodule, multiple 11/08/2015  Malnutrition of moderate degree 06/02/2015  Pneumonia 05/2015  Pulmonary emphysema ([place]) 11/06/2016  Tobacco abuse   Urge incontinence 11/06/2016 Past Surgical History: Past Surgical History: Procedure Laterality Date  arm surgery    DECORTICATION Right 06/03/2015  Procedure:  DECORTICATION;  Surgeon: Melrose Nakayama, MD;  Location: [facility];  Service: Thoracic;  Laterality: Right;  VIDEO ASSISTED THORACOSCOPY (VATS)/EMPYEMA Right 06/03/2015  Procedure: VIDEO ASSISTED THORACOSCOPY (VATS)/EMPYEMA;  Surgeon: Melrose Nakayama, MD;  [facility];  Service: Thoracic;  Laterality: Right; HPI: 61 year old male with history of HTN, tobacco use, EtOH use who comes into the hospital with sudden onset slurred speech and right-sided weakness.  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   He started to develop increased withdrawal symptoms on 11/13  No data recorded  Recommendations for follow up therapy are one component of a multi-disciplinary discharge planning process, led by the attending physician.  Recommendations may be updated based on patient status, additional functional criteria and insurance authorization. Assessment / Plan / Recommendation   11/28/2021  12:30 PM Clinical Impressions Clinical Impression Pt demonstrates mild oral dysphagia with slow lingual mobility, but adequate to clear most of the bolus from the oral cavity. Pt does have decreased bolus cohesion, contributing to premature spillage and delayed swallowing present with all trials. No penetration or aspiration occurred. No residue, no significant weakness. 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 Thin liquids shold  be toelrated if pt is sitting upright. Pills can be given whole in puree. Will f/u for tolerance.     11/28/2021  12:30 PM Treatment Recommendations Treatment Recommendations Therapy as outlined in treatment plan below     11/28/2021  12:30 PM Prognosis Prognosis for Safe Diet Advancement Good   11/28/2021  12:30 PM Diet Recommendations SLP Diet Recommendations Dysphagia 2 (Fine chop) solids;Thin liquid Liquid Administration via Cup;Straw Medication Administration Whole meds with puree Compensations Slow  rate;Small sips/bites Postural Changes Remain semi-upright after after feeds/meals (Comment);Seated upright at 90 degrees     11/28/2021  12:30 PM Other Recommendations Oral Care Recommendations Oral care BID Follow Up Recommendations Acute inpatient rehab (3hours/day)   11/28/2021  12:30 PM Frequency and Duration  Speech Therapy Frequency (ACUTE ONLY) min 2x/week Treatment Duration 2 weeks     11/28/2021  12:30 PM Oral Phase Oral Phase Impaired Oral - Nectar Cup Decreased bolus cohesion Oral - Nectar Straw Decreased bolus cohesion Oral - Thin Straw Decreased bolus cohesion Oral - Mech Soft Decreased bolus cohesion Oral - Pill [facility]    11/28/2021  12:30 PM Pharyngeal Phase Pharyngeal Phase Impaired Pharyngeal- Nectar Cup Delayed swallow initiation-pyriform sinuses Pharyngeal- Nectar Straw Delayed swallow initiation-pyriform sinuses Pharyngeal- Thin Straw Delayed swallow initiation-pyriform sinuses Pharyngeal- Puree Delayed swallow initiation-vallecula Pharyngeal- Mechanical Soft Delayed swallow initiation-vallecula     No data to display    DeBlois, Katherene Ponto 11/28/2021, 1:06 PM                     DG CHEST PORT 1 VIEW  Result Date: 11/24/2021 CLINICAL DATA:  Fever EXAM: PORTABLE CHEST 1 VIEW COMPARISON:  Chest radiograph done on 06/16/2016, CT chest done on 10/20/2020 FINDINGS: Cardiac size is within normal limits. Distal portion of enteric tube is seen in the stomach. Subtle increased interstitial markings are seen in left lower lung field. There is no pleural effusion or pneumothorax. IMPRESSION: Increased interstitial markings in the left lower lung fields suggest interstitial pneumonia. Electronically Signed   By: Elmer Picker M.D.   On: 11/24/2021 12:04   EEG adult  Result Date: 11/22/2021 Lora Havens, MD     11/22/2021  9:31 PM Patient Name: Adalid Horrall MRN: [id number] Epilepsy Attending: Lora Havens Referring Physician/Provider: Rosalin Hawking, MD Date: 11/22/2021 Duration: 22.28  mins Patient history: 61 year old male with left basal ganglia hemorrhage and altered mental status.  EEG to evaluate for seizure. Level of alertness: Awake AEDs during EEG study: Phenobarb Technical aspects: This EEG study was done with scalp electrodes positioned according to the 10-20 International system of electrode placement. Electrical activity was reviewed with band pass filter of 1-70Hz , sensitivity of 7 uV/mm, display speed of 57mm/sec with a 60Hz  notched filter applied as appropriate. EEG data were recorded continuously and digitally stored.  Video monitoring was available and reviewed as appropriate. Description: EEG showed continuous generalized 3 to 6 Hz theta-delta slowing admixed with an excessive amount of 15 to 18 Hz beta activity distributed symmetrically and diffusely. Hyperventilation and photic stimulation were not performed.   ABNORMALITY - Continuous slow, generalized - Excessive beta, generalized IMPRESSION: This study is suggestive of moderate to severe diffuse encephalopathy, nonspecific etiology. No seizures or epileptiform discharges were seen throughout the recording. Lora Havens   DG Abd Portable 1V  Result Date: 11/22/2021 CLINICAL DATA:  NG tube placement EXAM: PORTABLE ABDOMEN - 1 VIEW COMPARISON:  None Available. FINDINGS: Enteric tube tip projects over the stomach. No focal consolidation in  the partially imaged lung bases. Non dilated bowel loops in the upper abdomen. IMPRESSION: Enteric tube tip projects over the stomach. Electronically Signed   By: Darrin Nipper M.D.   On: 11/22/2021 11:14   CT HEAD WO CONTRAST (5MM)  Result Date: 11/20/2021 CLINICAL DATA:  Altered mental status, intraparenchymal hematoma. EXAM: CT HEAD WITHOUT CONTRAST TECHNIQUE: Contiguous axial images were obtained from the base of the skull through the vertex without intravenous contrast. RADIATION DOSE REDUCTION: This exam was performed according to the departmental dose-optimization program  which includes automated exposure control, adjustment of the mA and/or kV according to patient size and/or use of iterative reconstruction technique. COMPARISON:  CT head 2 days prior. FINDINGS: Brain: The 2.2 cm x 1.7 cm x 1.7 cm intraparenchymal hematoma centered in the left basal ganglia is unchanged in size and appearance. There is mild surrounding edema without mass effect or midline shift. There is no new acute intracranial hemorrhage or extra-axial fluid collection. There is no acute territorial infarct. Parenchymal volume is stable. The ventricles are stable in size. There is no mass lesion. Vascular: No hyperdense vessel or unexpected calcification. Skull: Normal. Negative for fracture or focal lesion. Sinuses/Orbits: The imaged paranasal sinuses are clear. The globes and orbits are unremarkable. Other: None. IMPRESSION: Unchanged intraparenchymal hematoma centered in the left basal ganglia. No new acute intracranial pathology. Electronically Signed   By: Valetta Mole M.D.   On: 11/20/2021 15:31    DISCHARGE EXAMINATION: Vitals:   12/19/21 1527 12/19/21 2020 12/20/21 0015 12/20/21 0321  BP: 122/85 133/64 117/83 119/79  Pulse: 74 78 75 76  Resp: 18 18 19 18   Temp: 98 F (36.7 C) 97.9 F (36.6 C) 98.5 F (36.9 C) 97.8 F (36.6 C)  TempSrc: Oral Oral  Oral  SpO2: 98% 98% 100% 99%  Weight:      Height:       Awake alert.  In no distress. Lungs are clear to auscultation bilaterally S1-S2 is normal regular. Abdomen is soft.  Nontender nondistended.   DISPOSITION: SNF  Discharge Instructions     Ambulatory referral to Neurology   Complete by: As directed    Follow up with stroke clinic NP (Jessica Vanschaick or Cecille Rubin, if both not available, consider Zachery Dauer, or [facility]) at [facility] in about 4 weeks. Thanks.   Call MD for:  difficulty breathing, headache or visual disturbances   Complete by: As directed    Call MD for:  extreme fatigue   Complete by: As directed    Call  MD for:  persistant dizziness or light-headedness   Complete by: As directed    Call MD for:  persistant nausea and vomiting   Complete by: As directed    Call MD for:  severe uncontrolled pain   Complete by: As directed    Call MD for:  temperature >100.4   Complete by: As directed    Diet - low sodium heart healthy   Complete by: As directed    Discharge instructions   Complete by: As directed    Please review instructions on the discharge summary.  You were cared for by a hospitalist during your hospital stay. If you have any questions about your discharge medications or the care you received while you were in the hospital after you are discharged, you can call the unit and asked to speak with the hospitalist on call if the hospitalist that took care of you is not available. Once you are discharged, your primary care  physician will handle any further medical issues. Please note that NO REFILLS for any discharge medications will be authorized once you are discharged, as it is imperative that you return to your primary care physician (or establish a relationship with a primary care physician if you do not have one) for your aftercare needs so that they can reassess your need for medications and monitor your lab values. If you do not have a primary care physician, you can call [phone] for a physician referral.   Increase activity slowly   Complete by: As directed    No wound care   Complete by: As directed           Allergies as of 12/20/2021   No Known Allergies      Medication List     STOP taking these medications    amLODipine 10 MG tablet Commonly known as: NORVASC   loperamide 2 MG tablet Commonly known as: Imodium A-D   meloxicam 15 MG tablet Commonly known as: MOBIC       TAKE these medications    acetaminophen 325 MG tablet Commonly known as: TYLENOL Take 2 tablets (650 mg total) by mouth every 4 (four) hours as needed for mild pain (or temp > 37.5 C  (99.5 F)).   albuterol (2.5 MG/3ML) 0.083% nebulizer solution Commonly known as: PROVENTIL Take 3 mLs (2.5 mg total) by nebulization every 6 (six) hours as needed for wheezing or shortness of breath.   Ventolin HFA 108 (90 Base) MCG/ACT inhaler Generic drug: albuterol Inhale 2 puffs into the lungs every 6 (six) hours as needed for wheezing or shortness of breath.   cyanocobalamin 1000 MCG tablet Take 1 tablet (1,000 mcg total) by mouth daily.   feeding supplement Liqd Take 237 mLs by mouth 3 (three) times daily between meals.   folic acid 1 MG tablet Commonly known as: FOLVITE Take 1 tablet (1 mg total) by mouth daily.   magnesium oxide 400 (240 Mg) MG tablet Commonly known as: MAG-OX Take 1 tablet (400 mg total) by mouth daily.   metoprolol tartrate 50 MG tablet Commonly known as: LOPRESSOR Take 1 tablet (50 mg total) by mouth 2 (two) times daily.   multivitamin with minerals Tabs tablet Take 1 tablet by mouth daily.   pantoprazole 40 MG tablet Commonly known as: PROTONIX Take 1 tablet (40 mg total) by mouth daily.   polyethylene glycol 17 g packet Commonly known as: MIRALAX / GLYCOLAX Take 17 g by mouth daily.   QUEtiapine 50 MG tablet Commonly known as: SEROQUEL Take 1 tablet (50 mg total) by mouth 2 (two) times daily.   senna-docusate 8.6-50 MG tablet Commonly known as: Senokot-S Take 2 tablets by mouth 2 (two) times daily.   Symbicort 80-4.5 MCG/ACT inhaler Generic drug: budesonide-formoterol Inhale 2 puffs into the lungs 2 (two) times daily   thiamine 100 MG tablet Commonly known as: Vitamin B-1 Take 1 tablet (100 mg total) by mouth daily.          Follow-up Information     [facility]. Schedule an appointment as soon as possible for a visit in 1 month(s).   Specialty: Neurology Why: stroke clinic Contact information: [address] [place] [phone]        Ladell Pier, MD.  Schedule an appointment as soon as possible for a visit.   Specialty: Internal Medicine Why: post hospitalization follow up Contact information: [address] [place]  [place]                 TOTAL DISCHARGE TIME: 35 minutes  [facility] Pager on [email or web address]  12/20/2021, 7:41 AM

## 2021-12-21 DIAGNOSIS — R718 Other abnormality of red blood cells: Secondary | ICD-10-CM | POA: Diagnosis not present

## 2021-12-21 DIAGNOSIS — E43 Unspecified severe protein-calorie malnutrition: Secondary | ICD-10-CM | POA: Diagnosis not present

## 2021-12-21 DIAGNOSIS — F10931 Alcohol use, unspecified with withdrawal delirium: Secondary | ICD-10-CM | POA: Diagnosis not present

## 2021-12-21 DIAGNOSIS — J439 Emphysema, unspecified: Secondary | ICD-10-CM | POA: Diagnosis not present

## 2021-12-21 DIAGNOSIS — I10 Essential (primary) hypertension: Secondary | ICD-10-CM | POA: Diagnosis not present

## 2021-12-21 DIAGNOSIS — E871 Hypo-osmolality and hyponatremia: Secondary | ICD-10-CM | POA: Diagnosis not present

## 2021-12-21 DIAGNOSIS — I610 Nontraumatic intracerebral hemorrhage in hemisphere, subcortical: Secondary | ICD-10-CM | POA: Diagnosis not present

## 2021-12-21 NOTE — Discharge Summary (Signed)
[facility]  Physician Discharge Summary   Patient ID: Brent Warren MRN: [id number] DOB/AGE: 62/03/1960 [age] y.o.  Admit date: 11/18/2021 Discharge date:   12/21/2021   PCP: Brent MatarJohnson, [name], MD  DISCHARGE DIAGNOSES:    ICH (intracerebral hemorrhage) (HCC)   Hyponatremia   HTN (hypertension)   Pulmonary emphysema (HCC)   Tobacco abuse   ETOH abuse   Alcohol withdrawal delirium (HCC)    Protein-calorie malnutrition, severe   RECOMMENDATIONS FOR OUTPATIENT FOLLOW UP: Patient needs outpatient follow-up with neurology CBC and complete metabolic panel and magnesium level every week as diet progresses  Home Health: Going to SNF Equipment/Devices: None  CODE STATUS: Full code  DISCHARGE CONDITION: fair  Diet recommendation: Heart healthy  INITIAL HISTORY: 61 year old male with history of HTN, tobacco use, chronic alcohol use presented with sudden onset slurred speech and right-sided weakness.  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  He was admitted to the neuro ICU, remained stable and transferred to the hospitalist service on 11/13.  Patient also developed alcohol withdrawal during this hospitalization.   Patient remains medically stable for discharge - awaiting safe disposition  HOSPITAL COURSE:   Left basal ganglia ICH Neurology was following and signed off on 11/24/2021 and recommended outpatient follow-up with neurology Echo showed EF of 60 to 65%.  LDL 62, A1c is 5.1 PT/OT recommending CIR.  As per CIR, patient is not a good candidate for CIR.   Patient to go to skilled nursing facility.   Oropharyngeal dysphagia Initially was kept n.p.o. and started on nasogastric tube feedings.   Seen by speech therapy and diet was advanced.   Has had good caloric intake.  NG tube was removed on 12/3. Tolerating his diet.  Continue to encourage oral intake.   Essential hypertension Initially was on amlodipine and metoprolol.  Blood pressures  improved and then started trending low.   Amlodipine dose was initially decreased.  Blood pressure again noted to be borderline low.  Will discontinue it altogether.  Continue just metoprolol for now.     PSVT Telemetry showed brief episodes of supraventricular tachycardia on 11/23. Echocardiogram shows normal systolic function.   TSH noted to be 5.0 with a normal free T4 of 0.77. Patient was started on beta-blocker.  Telemetry showed improvement.  Subsequently taken off of telemetry.   Agitation Seems to be stable on Seroquel.  Has been off of restraints for several days now.   Alcohol abuse, alcohol withdrawal with delirium tremens Patient was noted to have significant alcohol withdrawal symptoms.  PCCM consulted and have signed off.  Completed phenobarbital taper.  Vitamin B1 normal Continue thiamine, folic acid and multivitamin   Hypokalemia/hyponatremia/hypomagnesemia Monitor electrolytes periodically.  On magnesium oxide.  Dose of magnesium oxide was decreased due to a magnesium level of 3.2.   Possible aspiration pneumonia/Possible UTI UA suggestive of UTI as well.  Cultures negative. Completed 5 days of Unasyn.   Respiratory status noted to be stable.     Vitamin B12 deficiency B12 177.  Folate 12.7.   Continue cyanocobalamine.     Macrocytic anemia Secondary to alcohol abuse as well as B12 deficiency. Ferritin noted to be 1074.  Iron 95.  TIBC 252.   Elevated LFTs AST and ALT are noted to be minimally elevated.  Possibly from alcohol use.  No recent imaging study of the hepatobiliary system noted in EMR.   Since levels are reasonably stable this can be pursued in the outpatient setting. Hepatitis panel is  unremarkable.   Tobacco use, history of emphysema Continue nebs as needed Respiratory status is stable.   Severe protein calorie malnutrition Nutrition Problem: Severe Malnutrition Etiology: social / environmental circumstances Signs/Symptoms: severe fat  depletion, severe muscle depletion   Patient is stable.  Okay for discharge to SNF.   PERTINENT LABS:  The results of significant diagnostics from this hospitalization (including imaging, microbiology, ancillary and laboratory) are listed below for reference.     Labs:   Basic Metabolic Panel: Recent Labs  Lab 12/16/21 0310  NA 135  K 4.0  CL 102  CO2 21*  GLUCOSE 102*  BUN 16  CREATININE 0.75  CALCIUM 9.3  MG 3.2*    Liver Function Tests: Recent Labs  Lab 12/16/21 0310  AST 39  ALT 48*  ALKPHOS 147*  BILITOT 0.3  PROT 6.8  ALBUMIN 2.8*     CBC: No results for input(s): "WBC", "NEUTROABS", "HGB", "HCT", "MCV", "PLT" in the last 168 hours.    CBG: Recent Labs  Lab 12/18/21 2123 12/19/21 0616 12/19/21 1125 12/19/21 1657 12/19/21 2139  GLUCAP 112* 111* 119* 120* 110*      IMAGING STUDIES DG Swallowing Func-Speech Pathology  Result Date: 11/28/2021 Table formatting from the original result was not included. Images from the original result were not included. Objective Swallowing Evaluation: Type of Study: MBS-Modified Barium Swallow Study  Patient Details Name: Brent Warren MRN: [id number] Date of Birth: [birth year] Today's Date: 11/28/2021 Time: SLP Start Time (ACUTE ONLY): 1020 -SLP Stop Time (ACUTE ONLY): 1045 SLP Time Calculation (min) (ACUTE ONLY): 25 min Past Medical History: Past Medical History: Diagnosis Date  Alcoholic (HCC)   Chest pain 04/02/2017  COPD (chronic obstructive pulmonary disease) (HCC)   Empyema of pleural space (HCC) 05/2015  ETOH abuse 11/06/2016  Hepatitis 05/22/2015  HTN (hypertension)   Immunization due 11/06/2016  Lung nodule, multiple 11/08/2015  Malnutrition of moderate degree 06/02/2015  Pneumonia 05/2015  Pulmonary emphysema (HCC) 11/06/2016  Tobacco abuse   Urge incontinence 11/06/2016 Past Surgical History: Past Surgical History: Procedure Laterality Date  arm surgery    DECORTICATION Right 06/03/2015  Procedure: DECORTICATION;   Surgeon: [name], MD;  Location: [facility] OR;  Service: Thoracic;  Laterality: Right;  VIDEO ASSISTED THORACOSCOPY (VATS)/EMPYEMA Right 06/03/2015  Procedure: VIDEO ASSISTED THORACOSCOPY (VATS)/EMPYEMA;  Surgeon: [name], MD;  Location: [facility] OR;  Service: Thoracic;  Laterality: Right; HPI: 61 year old male with history of HTN, tobacco use, EtOH use who comes into the hospital with sudden onset slurred speech and right-sided weakness.  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   He started to develop increased withdrawal symptoms on 11/13  No data recorded  Recommendations for follow up therapy are one component of a multi-disciplinary discharge planning process, led by the attending physician.  Recommendations may be updated based on patient status, additional functional criteria and insurance authorization. Assessment / Plan / Recommendation   11/28/2021  12:30 PM Clinical Impressions Clinical Impression Pt demonstrates mild oral dysphagia with slow lingual mobility, but adequate to clear most of the bolus from the oral cavity. Pt does have decreased bolus cohesion, contributing to premature spillage and delayed swallowing present with all trials. No penetration or aspiration occurred. No residue, no significant weakness. 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 Thin liquids shold be toelrated if pt is sitting upright. Pills can be given whole in puree. Will f/u  for tolerance.     11/28/2021  12:30 PM Treatment Recommendations Treatment Recommendations Therapy as outlined in treatment plan below     11/28/2021  12:30 PM Prognosis Prognosis for Safe Diet Advancement Good   11/28/2021  12:30 PM Diet Recommendations SLP Diet Recommendations Dysphagia 2 (Fine chop) solids;Thin liquid Liquid Administration via Cup;Straw Medication Administration Whole meds with puree Compensations Slow rate;Small sips/bites  Postural Changes Remain semi-upright after after feeds/meals (Comment);Seated upright at 90 degrees     11/28/2021  12:30 PM Other Recommendations Oral Care Recommendations Oral care BID Follow Up Recommendations Acute inpatient rehab (3hours/day)   11/28/2021  12:30 PM Frequency and Duration  Speech Therapy Frequency (ACUTE ONLY) min 2x/week Treatment Duration 2 weeks     11/28/2021  12:30 PM Oral Phase Oral Phase Impaired Oral - Nectar Cup Decreased bolus cohesion Oral - Nectar Straw Decreased bolus cohesion Oral - Thin Straw Decreased bolus cohesion Oral - Mech Soft Decreased bolus cohesion Oral - Pill [facility]    11/28/2021  12:30 PM Pharyngeal Phase Pharyngeal Phase Impaired Pharyngeal- Nectar Cup Delayed swallow initiation-pyriform sinuses Pharyngeal- Nectar Straw Delayed swallow initiation-pyriform sinuses Pharyngeal- Thin Straw Delayed swallow initiation-pyriform sinuses Pharyngeal- Puree Delayed swallow initiation-vallecula Pharyngeal- Mechanical Soft Delayed swallow initiation-vallecula     No data to display    DeBlois, Riley Nearing 11/28/2021, 1:06 PM                     DG CHEST PORT 1 VIEW  Result Date: 11/24/2021 CLINICAL DATA:  Fever EXAM: PORTABLE CHEST 1 VIEW COMPARISON:  Chest radiograph done on 06/16/2016, CT chest done on 10/20/2020 FINDINGS: Cardiac size is within normal limits. Distal portion of enteric tube is seen in the stomach. Subtle increased interstitial markings are seen in left lower lung field. There is no pleural effusion or pneumothorax. IMPRESSION: Increased interstitial markings in the left lower lung fields suggest interstitial pneumonia. Electronically Signed   By: Ernie Avena M.D.   On: 11/24/2021 12:04   EEG adult  Result Date: 11/22/2021 Charlsie Quest, MD     11/22/2021  9:31 PM Patient Name: Treyvonne Tata MRN: [id number] Epilepsy Attending: Charlsie Quest Referring Physician/Provider: Marvel Plan, MD Date: 11/22/2021 Duration: 22.28 mins Patient history:  61 year old male with left basal ganglia hemorrhage and altered mental status.  EEG to evaluate for seizure. Level of alertness: Awake AEDs during EEG study: Phenobarb Technical aspects: This EEG study was done with scalp electrodes positioned according to the 10-20 International system of electrode placement. Electrical activity was reviewed with band pass filter of 1-70Hz , sensitivity of 7 uV/mm, display speed of 56mm/sec with a 60Hz  notched filter applied as appropriate. EEG data were recorded continuously and digitally stored.  Video monitoring was available and reviewed as appropriate. Description: EEG showed continuous generalized 3 to 6 Hz theta-delta slowing admixed with an excessive amount of 15 to 18 Hz beta activity distributed symmetrically and diffusely. Hyperventilation and photic stimulation were not performed.   ABNORMALITY - Continuous slow, generalized - Excessive beta, generalized IMPRESSION: This study is suggestive of moderate to severe diffuse encephalopathy, nonspecific etiology. No seizures or epileptiform discharges were seen throughout the recording.   DG Abd Portable 1V  Result Date: 11/22/2021 CLINICAL DATA:  NG tube placement EXAM: PORTABLE ABDOMEN - 1 VIEW COMPARISON:  None Available. FINDINGS: Enteric tube tip projects over the stomach. No focal consolidation in the partially imaged lung bases. Non dilated bowel loops in the upper abdomen. IMPRESSION: Enteric tube  tip projects over the stomach. Electronically Signed   By: Agustin Cree M.D.   On: 11/22/2021 11:14    DISCHARGE EXAMINATION: Vitals:   12/20/21 1952 12/20/21 2310 12/21/21 0454 12/21/21 0500  BP: 97/71 122/79 123/80   Pulse: 88 82 80   Resp: 14 16 18    Temp: 98.2 F (36.8 C) 97.6 F (36.4 C) 98.1 F (36.7 C)   TempSrc: Oral Oral Oral   SpO2: 96% 97% 96%   Weight:    54.4 kg  Height:       Awake alert.  In no distress. Lungs are clear to auscultation bilaterally S1-S2 is normal  regular. Abdomen is soft.  Nontender nondistended.   DISPOSITION: SNF  Discharge Instructions     Ambulatory referral to Neurology   Complete by: As directed    Follow up with stroke clinic NP (Jessica Vanschaick or , if both not available, consider Darrol Angel, or [place]) at [facility] in about 4 weeks. Thanks.   Call MD for:  difficulty breathing, headache or visual disturbances   Complete by: As directed    Call MD for:  extreme fatigue   Complete by: As directed    Call MD for:  persistant dizziness or light-headedness   Complete by: As directed    Call MD for:  persistant nausea and vomiting   Complete by: As directed    Call MD for:  severe uncontrolled pain   Complete by: As directed    Call MD for:  temperature >100.4   Complete by: As directed    Diet - low sodium heart healthy   Complete by: As directed    Discharge instructions   Complete by: As directed    Please review instructions on the discharge summary.  You were cared for by a hospitalist during your hospital stay. If you have any questions about your discharge medications or the care you received while you were in the hospital after you are discharged, you can call the unit and asked to speak with the hospitalist on call if the hospitalist that took care of you is not available. Once you are discharged, your primary care physician will handle any further medical issues. Please note that NO REFILLS for any discharge medications will be authorized once you are discharged, as it is imperative that you return to your primary care physician (or establish a relationship with a primary care physician if you do not have one) for your aftercare needs so that they can reassess your need for medications and monitor your lab values. If you do not have a primary care physician, you can call [phone] for a physician referral.   Increase activity slowly   Complete by: As directed    No wound care   Complete by: As  directed           Allergies as of 12/21/2021   No Known Allergies      Medication List     STOP taking these medications    amLODipine 10 MG tablet Commonly known as: NORVASC   loperamide 2 MG tablet Commonly known as: Imodium A-D   meloxicam 15 MG tablet Commonly known as: MOBIC       TAKE these medications    acetaminophen 325 MG tablet Commonly known as: TYLENOL Take 2 tablets (650 mg total) by mouth every 4 (four) hours as needed for mild pain (or temp > 37.5 C (99.5 F)).   albuterol (2.5 MG/3ML) 0.083% nebulizer solution Commonly known  as: PROVENTIL Take 3 mLs (2.5 mg total) by nebulization every 6 (six) hours as needed for wheezing or shortness of breath.   Ventolin HFA 108 (90 Base) MCG/ACT inhaler Generic drug: albuterol Inhale 2 puffs into the lungs every 6 (six) hours as needed for wheezing or shortness of breath.   cyanocobalamin 1000 MCG tablet Take 1 tablet (1,000 mcg total) by mouth daily.   feeding supplement Liqd Take 237 mLs by mouth 3 (three) times daily between meals.   folic acid 1 MG tablet Commonly known as: FOLVITE Take 1 tablet (1 mg total) by mouth daily.   magnesium oxide 400 (240 Mg) MG tablet Commonly known as: MAG-OX Take 1 tablet (400 mg total) by mouth daily.   metoprolol tartrate 50 MG tablet Commonly known as: LOPRESSOR Take 1 tablet (50 mg total) by mouth 2 (two) times daily.   multivitamin with minerals Tabs tablet Take 1 tablet by mouth daily.   pantoprazole 40 MG tablet Commonly known as: PROTONIX Take 1 tablet (40 mg total) by mouth daily.   polyethylene glycol 17 g packet Commonly known as: MIRALAX / GLYCOLAX Take 17 g by mouth daily.   QUEtiapine 50 MG tablet Commonly known as: SEROQUEL Take 1 tablet (50 mg total) by mouth 2 (two) times daily.   senna-docusate 8.6-50 MG tablet Commonly known as: Senokot-S Take 2 tablets by mouth 2 (two) times daily.   Symbicort 80-4.5 MCG/ACT inhaler Generic  drug: budesonide-formoterol Inhale 2 puffs into the lungs 2 (two) times daily   thiamine 100 MG tablet Commonly known as: Vitamin Warren-1 Take 1 tablet (100 mg total) by mouth daily.          Follow-up Information     [facility]. Schedule an appointment as soon as possible for a visit in 1 month(s).   Specialty: Neurology Why: stroke clinic Contact information: [address] [place] [address] [phone]        Brent Matar, MD. Schedule an appointment as soon as possible for a visit.   Specialty: Internal Medicine Why: post hospitalization follow up Contact information: [address] [place] [address] [phone]                 TOTAL DISCHARGE TIME: 35 minutes  Azucena Fallen DO  [facility] Pager on [email or web address]  12/21/2021, 6:47 AM

## 2021-12-21 NOTE — Progress Notes (Signed)
Mobility Specialist: Progress Note   12/21/21 1657  Mobility  Activity Refused mobility   Pt refused mobility, no reason specified. Will f/u as able.   [place] Mobility Specialist Please contact via SecureChat or Rehab office at [phone]

## 2021-12-21 NOTE — Progress Notes (Signed)
CSW spoke with Eunice Blase at [facility] who states the insurance authorization is still pending.  Edwin Dada, MSW, LCSW Transitions of Care  Clinical Social Worker II [phone]

## 2021-12-21 NOTE — Progress Notes (Signed)
Physical Therapy Treatment Patient Details Name: Brent Warren MRN: [id number] DOB: [birth year] Today's Date: 12/21/2021   History of Present Illness 61 yo male admitted 11/11 with Rt sided weakness, facial droop and slurred speech. 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 PMH includes: HTN, COPD, and alcohol abuse.    PT Comments    Pt very pleasant and able to increase gait distance and repeated sit to stands today. Pt with improved awareness or needing to place RUE on RW and for repositioning hand. He continues to need cues for safety and direction. Will continue to follow.     Recommendations for follow up therapy are one component of a multi-disciplinary discharge planning process, led by the attending physician.  Recommendations may be updated based on patient status, additional functional criteria and insurance authorization.  Follow Up Recommendations  Skilled nursing-short term rehab (<3 hours/day) Can patient physically be transported by private vehicle: Yes   Assistance Recommended at Discharge Frequent or constant Supervision/Assistance  Patient can return home with the following A lot of help with bathing/dressing/bathroom;Assistance with cooking/housework;Direct supervision/assist for medications management;Direct supervision/assist for financial management;Assist for transportation;Help with stairs or ramp for entrance;A little help with walking and/or transfers   Equipment Recommendations  Rolling walker (2 wheels)    Recommendations for Other Services       Precautions / Restrictions Precautions Precautions: Fall     Mobility  Bed Mobility Overal bed mobility: Needs Assistance Bed Mobility: Supine to Sit     Supine to sit: Min guard, HOB elevated     General bed mobility comments: HOB 20 degrees with HHA to fully elevate trunk    Transfers Overall transfer level: Needs assistance   Transfers: Sit to/from Stand Sit to Stand: Min guard, Min assist            General transfer comment: cues for hand placement and safety with min assist to rise from bed and toilet, minguard with cues for hand placement to rise from chair with pt able to repeat 10x    Ambulation/Gait Ambulation/Gait assistance: Min guard Gait Distance (Feet): 250 Feet Assistive device: Rolling walker (2 wheels) Gait Pattern/deviations: Step-through pattern, Decreased stride length, Trunk flexed   Gait velocity interpretation: 1.31 - 2.62 ft/sec, indicative of limited community ambulator   General Gait Details: cues throughout for RW proximity and upright posture as well as directional cues to room   Stairs             Wheelchair Mobility    Modified Rankin (Stroke Patients Only) Modified Rankin (Stroke Patients Only) Pre-Morbid Rankin Score: No symptoms Modified Rankin: Moderately severe disability     Balance Overall balance assessment: Needs assistance   Sitting balance-Leahy Scale: Fair     Standing balance support: Bilateral upper extremity supported, During functional activity, Reliant on assistive device for balance Standing balance-Leahy Scale: Poor Standing balance comment: reliant on external assist                            Cognition Arousal/Alertness: Awake/alert Behavior During Therapy: WFL for tasks assessed/performed Overall Cognitive Status: Impaired/Different from baseline Area of Impairment: Safety/judgement, Memory                     Memory: Decreased short-term memory Following Commands: Follows one step commands with increased time     Problem Solving: Slow processing          Exercises General Exercises -  Lower Extremity Long Arc Quad: AROM, 20 reps, Seated, Both Hip Flexion/Marching: Both, AROM, Seated, 20 reps, Strengthening    General Comments        Pertinent Vitals/Pain Pain Assessment Pain Assessment: No/denies pain    Home Living                          Prior  Function            PT Goals (current goals can now be found in the care plan section) Acute Rehab PT Goals Time For Goal Achievement: 01/04/22 Potential to Achieve Goals: Good Progress towards PT goals: Goals met and updated - see care plan    Frequency    Min 2X/week      PT Plan Frequency needs to be updated;Discharge plan needs to be updated    Co-evaluation              AM-PAC PT "6 Clicks" Mobility   Outcome Measure  Help needed turning from your back to your side while in a flat bed without using bedrails?: A Little Help needed moving from lying on your back to sitting on the side of a flat bed without using bedrails?: A Little Help needed moving to and from a bed to a chair (including a wheelchair)?: A Little Help needed standing up from a chair using your arms (e.g., wheelchair or bedside chair)?: A Little Help needed to walk in hospital room?: A Little Help needed climbing 3-5 steps with a railing? 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

## 2021-12-22 DIAGNOSIS — R718 Other abnormality of red blood cells: Secondary | ICD-10-CM | POA: Diagnosis not present

## 2021-12-22 DIAGNOSIS — I10 Essential (primary) hypertension: Secondary | ICD-10-CM | POA: Diagnosis not present

## 2021-12-22 DIAGNOSIS — I610 Nontraumatic intracerebral hemorrhage in hemisphere, subcortical: Secondary | ICD-10-CM | POA: Diagnosis not present

## 2021-12-22 DIAGNOSIS — E871 Hypo-osmolality and hyponatremia: Secondary | ICD-10-CM | POA: Diagnosis not present

## 2021-12-22 DIAGNOSIS — F10931 Alcohol use, unspecified with withdrawal delirium: Secondary | ICD-10-CM | POA: Diagnosis not present

## 2021-12-22 DIAGNOSIS — J439 Emphysema, unspecified: Secondary | ICD-10-CM | POA: Diagnosis not present

## 2021-12-22 DIAGNOSIS — E43 Unspecified severe protein-calorie malnutrition: Secondary | ICD-10-CM | POA: Diagnosis not present

## 2021-12-22 NOTE — Discharge Summary (Signed)
[facility]  Physician Discharge Summary   Patient ID: Brent Warren MRN: [id number] DOB/AGE: January 02, 1961 61 y.o.  Admit date: 11/18/2021 Discharge date:   12/22/2021   PCP: Marcine Matar, MD  DISCHARGE DIAGNOSES:    ICH (intracerebral hemorrhage) (HCC)   Hyponatremia   HTN (hypertension)   Pulmonary emphysema (HCC)   Tobacco abuse   ETOH abuse   Alcohol withdrawal delirium (HCC)    Protein-calorie malnutrition, severe   RECOMMENDATIONS FOR OUTPATIENT FOLLOW UP: Patient needs outpatient follow-up with neurology CBC and complete metabolic panel and magnesium level every week as diet progresses  Home Health: Going to SNF Equipment/Devices: None  CODE STATUS: Full code  DISCHARGE CONDITION: fair  Diet recommendation: Heart healthy  INITIAL HISTORY: 61 year old male with history of HTN, tobacco use, chronic alcohol use presented with sudden onset slurred speech and right-sided weakness.  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  He was admitted to the neuro ICU, remained stable and transferred to the hospitalist service on 11/13.  Patient also developed alcohol withdrawal during this hospitalization.   Patient remains medically stable for discharge - awaiting safe disposition  HOSPITAL COURSE:   Left basal ganglia ICH Neurology was following and signed off on 11/24/2021 and recommended outpatient follow-up with neurology Echo showed EF of 60 to 65%.  LDL 62, A1c is 5.1 PT/OT recommending CIR.  As per CIR, patient is not a good candidate for CIR.   Patient to go to skilled nursing facility.   Oropharyngeal dysphagia Initially was kept n.p.o. and started on nasogastric tube feedings.   Seen by speech therapy and diet was advanced.   Has had good caloric intake.  NG tube was removed on 12/3. Tolerating his diet.  Continue to encourage oral intake.   Essential hypertension Initially was on amlodipine and metoprolol.  Blood pressures  improved and then started trending low.   Amlodipine dose was initially decreased.  Blood pressure again noted to be borderline low.  Will discontinue it altogether.  Continue just metoprolol for now.     PSVT Telemetry showed brief episodes of supraventricular tachycardia on 11/23. Echocardiogram shows normal systolic function.   TSH noted to be 5.0 with a normal free T4 of 0.77. Patient was started on beta-blocker.  Telemetry showed improvement.  Subsequently taken off of telemetry.   Agitation Seems to be stable on Seroquel.  Has been off of restraints for several days now.   Alcohol abuse, alcohol withdrawal with delirium tremens Patient was noted to have significant alcohol withdrawal symptoms.  PCCM consulted and have signed off.  Completed phenobarbital taper.  Vitamin B1 normal Continue thiamine, folic acid and multivitamin   Hypokalemia/hyponatremia/hypomagnesemia Monitor electrolytes periodically.  On magnesium oxide.  Dose of magnesium oxide was decreased due to a magnesium level of 3.2.   Possible aspiration pneumonia/Possible UTI UA suggestive of UTI as well.  Cultures negative. Completed 5 days of Unasyn.   Respiratory status noted to be stable.     Vitamin B12 deficiency B12 177.  Folate 12.7.   Continue cyanocobalamine.     Macrocytic anemia Secondary to alcohol abuse as well as B12 deficiency. Ferritin noted to be 1074.  Iron 95.  TIBC 252.   Elevated LFTs AST and ALT are noted to be minimally elevated.  Possibly from alcohol use.  No recent imaging study of the hepatobiliary system noted in EMR.   Since levels are reasonably stable this can be pursued in the outpatient setting. Hepatitis panel is  unremarkable.   Tobacco use, history of emphysema Continue nebs as needed Respiratory status is stable.   Severe protein calorie malnutrition Nutrition Problem: Severe Malnutrition Etiology: social / environmental circumstances Signs/Symptoms: severe fat  depletion, severe muscle depletion   Patient is stable.  Okay for discharge to SNF.   PERTINENT LABS:  The results of significant diagnostics from this hospitalization (including imaging, microbiology, ancillary and laboratory) are listed below for reference.     Labs:   Basic Metabolic Panel: Recent Labs  Lab 12/16/21 0310  NA 135  K 4.0  CL 102  CO2 21*  GLUCOSE 102*  BUN 16  CREATININE 0.75  CALCIUM 9.3  MG 3.2*    Liver Function Tests: Recent Labs  Lab 12/16/21 0310  AST 39  ALT 48*  ALKPHOS 147*  BILITOT 0.3  PROT 6.8  ALBUMIN 2.8*     CBC: No results for input(s): "WBC", "NEUTROABS", "HGB", "HCT", "MCV", "PLT" in the last 168 hours.    CBG: Recent Labs  Lab 12/18/21 2123 12/19/21 0616 12/19/21 1125 12/19/21 1657 12/19/21 2139  GLUCAP 112* 111* 119* 120* 110*      IMAGING STUDIES DG Swallowing Func-Speech Pathology  Result Date: 11/28/2021 Table formatting from the original result was not included. Images from the original result were not included. Objective Swallowing Evaluation: Type of Study: MBS-Modified Barium Swallow Study  Patient Details Name: Brent Warren MRN: [id number] Date of Birth: 1960/12/05 Today's Date: 11/28/2021 Time: SLP Start Time (ACUTE ONLY): 1020 -SLP Stop Time (ACUTE ONLY): 1045 SLP Time Calculation (min) (ACUTE ONLY): 25 min Past Medical History: Past Medical History: Diagnosis Date  Alcoholic (HCC)   Chest pain 04/02/2017  COPD (chronic obstructive pulmonary disease) (HCC)   Empyema of pleural space (HCC) 05/2015  ETOH abuse 11/06/2016  Hepatitis 05/22/2015  HTN (hypertension)   Immunization due 11/06/2016  Lung nodule, multiple 11/08/2015  Malnutrition of moderate degree 06/02/2015  Pneumonia 05/2015  Pulmonary emphysema (HCC) 11/06/2016  Tobacco abuse   Urge incontinence 11/06/2016 Past Surgical History: Past Surgical History: Procedure Laterality Date  arm surgery    DECORTICATION Right 06/03/2015  Procedure: DECORTICATION;   Surgeon: [name], MD;  Location: [facility] OR;  Service: Thoracic;  Laterality: Right;  VIDEO ASSISTED THORACOSCOPY (VATS)/EMPYEMA Right 06/03/2015  Procedure: VIDEO ASSISTED THORACOSCOPY (VATS)/EMPYEMA;  Surgeon: [name], MD;  Location: [facility] OR;  Service: Thoracic;  Laterality: Right; HPI: 61 year old male with history of HTN, tobacco use, EtOH use who comes into the hospital with sudden onset slurred speech and right-sided weakness.  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   He started to develop increased withdrawal symptoms on 11/13  No data recorded  Recommendations for follow up therapy are one component of a multi-disciplinary discharge planning process, led by the attending physician.  Recommendations may be updated based on patient status, additional functional criteria and insurance authorization. Assessment / Plan / Recommendation   11/28/2021  12:30 PM Clinical Impressions Clinical Impression Pt demonstrates mild oral dysphagia with slow lingual mobility, but adequate to clear most of the bolus from the oral cavity. Pt does have decreased bolus cohesion, contributing to premature spillage and delayed swallowing present with all trials. No penetration or aspiration occurred. No residue, no significant weakness. 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 Thin liquids shold be toelrated if pt is sitting upright. Pills can be given whole in puree. Will f/u  for tolerance.     11/28/2021  12:30 PM Treatment Recommendations Treatment Recommendations Therapy as outlined in treatment plan below     11/28/2021  12:30 PM Prognosis Prognosis for Safe Diet Advancement Good   11/28/2021  12:30 PM Diet Recommendations SLP Diet Recommendations Dysphagia 2 (Fine chop) solids;Thin liquid Liquid Administration via Cup;Straw Medication Administration Whole meds with puree Compensations Slow rate;Small sips/bites  Postural Changes Remain semi-upright after after feeds/meals (Comment);Seated upright at 90 degrees     11/28/2021  12:30 PM Other Recommendations Oral Care Recommendations Oral care BID Follow Up Recommendations Acute inpatient rehab (3hours/day)   11/28/2021  12:30 PM Frequency and Duration  Speech Therapy Frequency (ACUTE ONLY) min 2x/week Treatment Duration 2 weeks     11/28/2021  12:30 PM Oral Phase Oral Phase Impaired Oral - Nectar Cup Decreased bolus cohesion Oral - Nectar Straw Decreased bolus cohesion Oral - Thin Straw Decreased bolus cohesion Oral - Mech Soft Decreased bolus cohesion Oral - Pill [facility]    11/28/2021  12:30 PM Pharyngeal Phase Pharyngeal Phase Impaired Pharyngeal- Nectar Cup Delayed swallow initiation-pyriform sinuses Pharyngeal- Nectar Straw Delayed swallow initiation-pyriform sinuses Pharyngeal- Thin Straw Delayed swallow initiation-pyriform sinuses Pharyngeal- Puree Delayed swallow initiation-vallecula Pharyngeal- Mechanical Soft Delayed swallow initiation-vallecula     No data to display    DeBlois, Riley Nearing 11/28/2021, 1:06 PM                     DG CHEST PORT 1 VIEW  Result Date: 11/24/2021 CLINICAL DATA:  Fever EXAM: PORTABLE CHEST 1 VIEW COMPARISON:  Chest radiograph done on 06/16/2016, CT chest done on 10/20/2020 FINDINGS: Cardiac size is within normal limits. Distal portion of enteric tube is seen in the stomach. Subtle increased interstitial markings are seen in left lower lung field. There is no pleural effusion or pneumothorax. IMPRESSION: Increased interstitial markings in the left lower lung fields suggest interstitial pneumonia. Electronically Signed   By: Ernie Avena M.D.   On: 11/24/2021 12:04   EEG adult  Result Date: 11/22/2021 Charlsie Quest, MD     11/22/2021  9:31 PM Patient Name: Brent Warren MRN: [id number] Epilepsy Attending: Charlsie Quest Referring Physician/Provider: Marvel Plan, MD Date: 11/22/2021 Duration: 22.28 mins Patient history:  61 year old male with left basal ganglia hemorrhage and altered mental status.  EEG to evaluate for seizure. Level of alertness: Awake AEDs during EEG study: Phenobarb Technical aspects: This EEG study was done with scalp electrodes positioned according to the 10-20 International system of electrode placement. Electrical activity was reviewed with band pass filter of 1-70Hz , sensitivity of 7 uV/mm, display speed of 56mm/sec with a 60Hz  notched filter applied as appropriate. EEG data were recorded continuously and digitally stored.  Video monitoring was available and reviewed as appropriate. Description: EEG showed continuous generalized 3 to 6 Hz theta-delta slowing admixed with an excessive amount of 15 to 18 Hz beta activity distributed symmetrically and diffusely. Hyperventilation and photic stimulation were not performed.   ABNORMALITY - Continuous slow, generalized - Excessive beta, generalized IMPRESSION: This study is suggestive of moderate to severe diffuse encephalopathy, nonspecific etiology. No seizures or epileptiform discharges were seen throughout the recording.   DG Abd Portable 1V  Result Date: 11/22/2021 CLINICAL DATA:  NG tube placement EXAM: PORTABLE ABDOMEN - 1 VIEW COMPARISON:  None Available. FINDINGS: Enteric tube tip projects over the stomach. No focal consolidation in the partially imaged lung bases. Non dilated bowel loops in the upper abdomen. IMPRESSION: Enteric tube  tip projects over the stomach. Electronically Signed   By: Agustin CreeLimin  Xu M.D.   On: 11/22/2021 11:14    DISCHARGE EXAMINATION: Vitals:   12/21/21 1701 12/21/21 1957 12/22/21 0412 12/22/21 0500  BP: 115/87 121/79 115/75   Pulse: 88 77 77   Resp:  17 16   Temp: 97.8 F (36.6 C) 97.8 F (36.6 C) (!) 97.4 F (36.3 C)   TempSrc: Oral Oral Oral   SpO2: 97% 97% 97%   Weight:    54.9 kg  Height:       Awake alert.  In no distress. Lungs are clear to auscultation bilaterally S1-S2 is normal  regular. Abdomen is soft.  Nontender nondistended.   DISPOSITION: SNF  Discharge Instructions     Ambulatory referral to Neurology   Complete by: As directed    Follow up with stroke clinic NP (Jessica Vanschaick or Darrol Angelarolyn Martin, if both not available, consider Manson AllanSethi, Penumali, or Ahern[facility]. Thanks.   Call MD for:  difficulty breathing, headache or visual disturbances   Complete by: As directed    Call MD for:  extreme fatigue   Complete by: As directed    Call MD for:  persistant dizziness or light-headedness   Complete by: As directed    Call MD for:  persistant nausea and vomiting   Complete by: As directed    Call MD for:  severe uncontrolled pain   Complete by: As directed    Call MD for:  temperature >100.4   Complete by: As directed    Diet - low sodium heart healthy   Complete by: As directed    Discharge instructions   Complete by: As directed    Please review instructions on the discharge summary.  You were cared for by a hospitalist during your hospital stay. If you have any questions about your discharge medications or the care you received while you were in the hospital after you are discharged, you can call the unit and asked to speak with the hospitalist on call if the hospitalist that took care of you is not available. Once you are discharged, your primary care physician will handle any further medical issues. Please note that NO REFILLS for any discharge medications will be authorized once you are discharged, as it is imperative that you return to your primary care physician (or establish a relationship with a primary care physician if you do not have one) for your aftercare needs so that they can reassess your need for medications and monitor your lab values. If you do not have a primary care physician, you can call [phone] for a physician referral.   Increase activity slowly   Complete by: As directed    No wound care   Complete by: As  directed           Allergies as of 12/22/2021   No Known Allergies      Medication List     STOP taking these medications    amLODipine 10 MG tablet Commonly known as: NORVASC   loperamide 2 MG tablet Commonly known as: Imodium A-D   meloxicam 15 MG tablet Commonly known as: MOBIC       TAKE these medications    acetaminophen 325 MG tablet Commonly known as: TYLENOL Take 2 tablets (650 mg total) by mouth every 4 (four) hours as needed for mild pain (or temp > 37.5 C (99.5 F)).   albuterol (2.5 MG/3ML) 0.083% nebulizer solution Commonly  known as: PROVENTIL Take 3 mLs (2.5 mg total) by nebulization every 6 (six) hours as needed for wheezing or shortness of breath.   Ventolin HFA 108 (90 Base) MCG/ACT inhaler Generic drug: albuterol Inhale 2 puffs into the lungs every 6 (six) hours as needed for wheezing or shortness of breath.   cyanocobalamin 1000 MCG tablet Take 1 tablet (1,000 mcg total) by mouth daily.   feeding supplement Liqd Take 237 mLs by mouth 3 (three) times daily between meals.   folic acid 1 MG tablet Commonly known as: FOLVITE Take 1 tablet (1 mg total) by mouth daily.   magnesium oxide 400 (240 Mg) MG tablet Commonly known as: MAG-OX Take 1 tablet (400 mg total) by mouth daily.   metoprolol tartrate 50 MG tablet Commonly known as: LOPRESSOR Take 1 tablet (50 mg total) by mouth 2 (two) times daily.   multivitamin with minerals Tabs tablet Take 1 tablet by mouth daily.   pantoprazole 40 MG tablet Commonly known as: PROTONIX Take 1 tablet (40 mg total) by mouth daily.   polyethylene glycol 17 g packet Commonly known as: MIRALAX / GLYCOLAX Take 17 g by mouth daily.   QUEtiapine 50 MG tablet Commonly known as: SEROQUEL Take 1 tablet (50 mg total) by mouth 2 (two) times daily.   senna-docusate 8.6-50 MG tablet Commonly known as: Senokot-S Take 2 tablets by mouth 2 (two) times daily.   Symbicort 80-4.5 MCG/ACT inhaler Generic  drug: budesonide-formoterol Inhale 2 puffs into the lungs 2 (two) times daily   thiamine 100 MG tablet Commonly known as: Vitamin B-1 Take 1 tablet (100 mg total) by mouth daily.          Follow-up Information     [facility]. Schedule an appointment as soon as possible for a visit in 1 month(s).   Specialty: Neurology Why: stroke clinic Contact information: [address] [place] [address] [phone]        Marcine Matar, MD. Schedule an appointment as soon as possible for a visit.   Specialty: Internal Medicine Why: post hospitalization follow up Contact information: [address] [place] [address] [place] [address] [phone]                 TOTAL DISCHARGE TIME: 35 minutes  Azucena Fallen DO  [facility] Pager on [email or web address]  12/22/2021, 7:23 AM

## 2021-12-22 NOTE — Progress Notes (Signed)
Occupational Therapy Treatment Patient Details Name: Brent Warren MRN: [id number] DOB: 10/17/1960 Today's Date: 12/22/2021   History of present illness 61 yo male admitted 11/11 with Rt sided weakness, facial droop and slurred speech. 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 PMH includes: HTN, COPD, and alcohol abuse.   OT comments  Patient participated in LB dressing with donn paper scrubs while seated on EOB and able to assist with pulling up. Patient progressing with mobility with RW requiring min assist for walker management and safety. Provided patient with squeeze ball and instructions on exercises to increase RUE grip strength. Discharge recommendations continue to be appropriate.    Recommendations for follow up therapy are one component of a multi-disciplinary discharge planning process, led by the attending physician.  Recommendations may be updated based on patient status, additional functional criteria and insurance authorization.    Follow Up Recommendations  Skilled nursing-short term rehab (<3 hours/day)     Assistance Recommended at Discharge Frequent or constant Supervision/Assistance  Patient can return home with the following  A little help with walking and/or transfers;A little help with bathing/dressing/bathroom;Direct supervision/assist for medications management;Direct supervision/assist for financial management;Assist for transportation;Help with stairs or ramp for entrance   Equipment Recommendations  BSC/3in1;Wheelchair (measurements OT);Wheelchair cushion (measurements OT)    Recommendations for Other Services      Precautions / Restrictions Precautions Precautions: Fall Precaution Comments: watch HR Restrictions Weight Bearing Restrictions: No       Mobility Bed Mobility Overal bed mobility: Needs Assistance Bed Mobility: Supine to Sit     Supine to sit: Min guard, HOB elevated     General bed mobility comments: min guard for trunk     Transfers Overall transfer level: Needs assistance Equipment used: Rolling walker (2 wheels) Transfers: Sit to/from Stand Sit to Stand: Min assist           General transfer comment: patient able to use RUE to assist with RW use and min assist for walker management     Balance Overall balance assessment: Needs assistance Sitting-balance support: Feet supported, No upper extremity supported Sitting balance-Leahy Scale: Fair     Standing balance support: Bilateral upper extremity supported, During functional activity, Reliant on assistive device for balance Standing balance-Leahy Scale: Poor Standing balance comment: reliant on external assist                           ADL either performed or assessed with clinical judgement   ADL Overall ADL's : Needs assistance/impaired                     Lower Body Dressing: Minimal assistance;Sit to/from stand Lower Body Dressing Details (indicate cue type and reason): donned paper scrubs             Functional mobility during ADLs: Minimal assistance;Rolling walker (2 wheels) General ADL Comments: increased mobility with RW    Extremity/Trunk Assessment Upper Extremity Assessment RUE Deficits / Details: able to use RUE with RW RUE Sensation: decreased light touch RUE Coordination: decreased fine motor;decreased gross motor LUE Deficits / Details: [facility]            Vision       Perception     Praxis      Cognition Arousal/Alertness: Awake/alert Behavior During Therapy: WFL for tasks assessed/performed Overall Cognitive Status: Impaired/Different from baseline Area of Impairment: Safety/judgement, Memory  Memory: Decreased short-term memory Following Commands: Follows one step commands with increased time Safety/Judgement: Decreased awareness of safety, Decreased awareness of deficits   Problem Solving: Slow processing General Comments: required max encouragement to  participate but was appreciative at end of session        Exercises Exercises: Other exercises Other Exercises Other Exercises: squeeze ball exercises    Shoulder Instructions       General Comments      Pertinent Vitals/ Pain       Pain Assessment Pain Assessment: Faces Faces Pain Scale: No hurt Pain Intervention(s): Monitored during session  Home Living                                          Prior Functioning/Environment              Frequency  Min 2X/week        Progress Toward Goals  OT Goals(current goals can now be found in the care plan section)  Progress towards OT goals: Progressing toward goals  Acute Rehab OT Goals Patient Stated Goal: get better OT Goal Formulation: With patient Time For Goal Achievement: 01/01/22 Potential to Achieve Goals: Fair ADL Goals Pt Will Perform Grooming: with min guard assist;standing Pt Will Perform Upper Body Bathing: with min guard assist;sitting Pt Will Transfer to Toilet: with min assist;ambulating;grab bars Pt/caregiver will Perform Home Exercise Program: Increased ROM;Increased strength;Right Upper extremity;With minimal assist;With written HEP provided Additional ADL Goal #1: pt will complete basic transfer +2 min (A) as precursor to adls  Plan Discharge plan needs to be updated    Co-evaluation                 AM-PAC OT "6 Clicks" Daily Activity     Outcome Measure   Help from another person eating meals?: A Little Help from another person taking care of personal grooming?: A Little Help from another person toileting, which includes using toliet, bedpan, or urinal?: A Little Help from another person bathing (including washing, rinsing, drying)?: A Lot Help from another person to put on and taking off regular upper body clothing?: A Little Help from another person to put on and taking off regular lower body clothing?: A Lot 6 Click Score: 16    End of Session Equipment  Utilized During Treatment: Gait belt;Rolling walker (2 wheels)  OT Visit Diagnosis: Unsteadiness on feet (R26.81);Muscle weakness (generalized) (M62.81);Hemiplegia and hemiparesis;Low vision, both eyes (H54.2);Other symptoms and signs involving cognitive function;Other symptoms and signs involving the nervous system (R29.898) Hemiplegia - Right/Left: Right Hemiplegia - dominant/non-dominant: Dominant Hemiplegia - caused by: Other Nontraumatic intracranial hemorrhage   Activity Tolerance Patient tolerated treatment well   Patient Left in chair;with chair alarm set;with call bell/phone within reach   Nurse Communication Mobility status        Time: 0350-0938 OT Time Calculation (min): 29 min  Charges: OT General Charges $OT Visit: 1 Visit OT Treatments $Self Care/Home Management : 8-22 mins $Therapeutic Activity: 8-22 mins  Alfonse Flavors, OTA Acute Rehabilitation Services  Office [phone]   Dewain Penning 12/22/2021, 2:33 PM

## 2021-12-22 NOTE — Progress Notes (Signed)
Mobility Specialist - Progress Note   12/22/21 1131  Mobility  Activity Ambulated with assistance in hallway  Level of Assistance Contact guard assist, steadying assist  Assistive Device Front wheel walker  Distance Ambulated (ft) 150 ft  Activity Response Tolerated well  Mobility Referral Yes  $Mobility charge 1 Mobility   Pt received in bed and agreeable to session. No complaints during session. Pt was returned to bed with all needs met.  Franki Monte  Mobility Specialist Please contact via Solicitor or Rehab office at [phone]

## 2021-12-23 DIAGNOSIS — E871 Hypo-osmolality and hyponatremia: Secondary | ICD-10-CM

## 2021-12-23 DIAGNOSIS — J439 Emphysema, unspecified: Secondary | ICD-10-CM | POA: Diagnosis not present

## 2021-12-23 DIAGNOSIS — I610 Nontraumatic intracerebral hemorrhage in hemisphere, subcortical: Secondary | ICD-10-CM | POA: Diagnosis not present

## 2021-12-23 DIAGNOSIS — R718 Other abnormality of red blood cells: Secondary | ICD-10-CM

## 2021-12-23 DIAGNOSIS — E43 Unspecified severe protein-calorie malnutrition: Secondary | ICD-10-CM | POA: Diagnosis not present

## 2021-12-23 DIAGNOSIS — I10 Essential (primary) hypertension: Secondary | ICD-10-CM | POA: Diagnosis not present

## 2021-12-23 DIAGNOSIS — F1010 Alcohol abuse, uncomplicated: Secondary | ICD-10-CM | POA: Diagnosis not present

## 2021-12-23 DIAGNOSIS — F10931 Alcohol use, unspecified with withdrawal delirium: Secondary | ICD-10-CM | POA: Diagnosis not present

## 2021-12-23 DIAGNOSIS — Z720 Tobacco use: Secondary | ICD-10-CM

## 2021-12-23 NOTE — Plan of Care (Signed)
  Problem: Education: Goal: Knowledge of disease or condition will improve Outcome: Progressing Goal: Knowledge of secondary prevention will improve (MUST DOCUMENT ALL) Outcome: Progressing Goal: Knowledge of patient specific risk factors will improve (Mark N/A or DELETE if not current risk factor) Outcome: Progressing   Problem: Coping: Goal: Will verbalize positive feelings about self Outcome: Progressing Goal: Will identify appropriate support needs Outcome: Progressing   Problem: Health Behavior/Discharge Planning: Goal: Ability to manage health-related needs will improve Outcome: Progressing Goal: Goals will be collaboratively established with patient/family Outcome: Progressing   Problem: Self-Care: Goal: Ability to participate in self-care as condition permits will improve Outcome: Progressing Goal: Verbalization of feelings and concerns over difficulty with self-care will improve Outcome: Progressing Goal: Ability to communicate needs accurately will improve Outcome: Progressing   Problem: Nutrition: Goal: Risk of aspiration will decrease Outcome: Progressing Goal: Dietary intake will improve Outcome: Progressing   Problem: Education: Goal: Knowledge of General Education information will improve Description: Including pain rating scale, medication(s)/side effects and non-pharmacologic comfort measures Outcome: Progressing   Problem: Health Behavior/Discharge Planning: Goal: Ability to manage health-related needs will improve Outcome: Progressing   Problem: Clinical Measurements: Goal: Will remain free from infection Outcome: Progressing Goal: Diagnostic test results will improve Outcome: Progressing Goal: Respiratory complications will improve Outcome: Progressing Goal: Cardiovascular complication will be avoided Outcome: Progressing   Problem: Activity: Goal: Risk for activity intolerance will decrease Outcome: Progressing   Problem: Nutrition: Goal:  Adequate nutrition will be maintained Outcome: Progressing   Problem: Safety: Goal: Ability to remain free from injury will improve Outcome: Progressing   Problem: Skin Integrity: Goal: Risk for impaired skin integrity will decrease Outcome: Progressing   Problem: Education: Goal: Ability to describe self-care measures that may prevent or decrease complications (Diabetes Survival Skills Education) will improve Outcome: Progressing Goal: Individualized Educational Video(s) Outcome: Progressing   Problem: Fluid Volume: Goal: Ability to maintain a balanced intake and output will improve Outcome: Progressing   Problem: Health Behavior/Discharge Planning: Goal: Ability to identify and utilize available resources and services will improve Outcome: Progressing Goal: Ability to manage health-related needs will improve Outcome: Progressing   Problem: Metabolic: Goal: Ability to maintain appropriate glucose levels will improve Outcome: Progressing   Problem: Nutritional: Goal: Maintenance of adequate nutrition will improve Outcome: Progressing Goal: Progress toward achieving an optimal weight will improve Outcome: Progressing   Problem: Skin Integrity: Goal: Risk for impaired skin integrity will decrease Outcome: Progressing   

## 2021-12-23 NOTE — Progress Notes (Signed)
Spoke to [place] with [facility] who reports Berkley Harvey remains pending at this time. SW will provide updates as available.   Dellie Burns, MSW, LCSW [phone] (coverage)

## 2021-12-23 NOTE — Discharge Summary (Signed)
[facility]  Physician Discharge Summary   Patient ID: Brent RopesRonnie Warren MRN: [id number] DOB/AGE: 62/03/1960 62 y.o.  Admit date: 11/18/2021 Discharge date:   12/23/2021   PCP: Brent MatarJohnson, [name], MD  DISCHARGE DIAGNOSES:    ICH (intracerebral hemorrhage) (HCC)   Hyponatremia   HTN (hypertension)   Pulmonary emphysema (HCC)   Tobacco abuse   ETOH abuse   Alcohol withdrawal delirium (HCC)    Protein-calorie malnutrition, severe   RECOMMENDATIONS FOR OUTPATIENT FOLLOW UP: Patient needs outpatient follow-up with neurology CBC and complete metabolic panel and magnesium level every week as diet progresses  Home Health: Going to SNF Equipment/Devices: None  CODE STATUS: Full code  DISCHARGE CONDITION: fair  Diet recommendation: Heart healthy  INITIAL HISTORY: 61 year old male with history of HTN, tobacco use, chronic alcohol use presented with sudden onset slurred speech and right-sided weakness.  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  He was admitted to the neuro ICU, remained stable and transferred to the hospitalist service on 11/13.  Patient also developed alcohol withdrawal during this hospitalization.   Patient remains medically stable for discharge - awaiting safe disposition  HOSPITAL COURSE:   Left basal ganglia ICH Neurology was following and signed off on 11/24/2021 and recommended outpatient follow-up with neurology Echo showed EF of 60 to 65%.  LDL 62, A1c is 5.1 PT/OT recommending CIR.  As per CIR, patient is not a good candidate for CIR.   Patient to go to skilled nursing facility.   Oropharyngeal dysphagia Initially was kept n.p.o. and started on nasogastric tube feedings.   Seen by speech therapy and diet was advanced.   Has had good caloric intake.  NG tube was removed on 12/3. Tolerating his diet.  Continue to encourage oral intake.   Essential hypertension Initially was on amlodipine and metoprolol.  Blood pressures  improved and then started trending low.   Amlodipine dose was initially decreased.  Blood pressure again noted to be borderline low.  Will discontinue it altogether.  Continue just metoprolol for now.     PSVT Telemetry showed brief episodes of supraventricular tachycardia on 11/23. Echocardiogram shows normal systolic function.   TSH noted to be 5.0 with a normal free T4 of 0.77. Patient was started on beta-blocker.  Telemetry showed improvement.  Subsequently taken off of telemetry.   Agitation Seems to be stable on Seroquel.  Has been off of restraints for several days now.   Alcohol abuse, alcohol withdrawal with delirium tremens Patient was noted to have significant alcohol withdrawal symptoms.  PCCM consulted and have signed off.  Completed phenobarbital taper.  Vitamin B1 normal Continue thiamine, folic acid and multivitamin   Hypokalemia/hyponatremia/hypomagnesemia Monitor electrolytes periodically.  On magnesium oxide.  Dose of magnesium oxide was decreased due to a magnesium level of 3.2.   Possible aspiration pneumonia/Possible UTI UA suggestive of UTI as well.  Cultures negative. Completed 5 days of Unasyn.   Respiratory status noted to be stable.     Vitamin B12 deficiency B12 177.  Folate 12.7.   Continue cyanocobalamine.     Macrocytic anemia Secondary to alcohol abuse as well as B12 deficiency. Ferritin noted to be 1074.  Iron 95.  TIBC 252.   Elevated LFTs AST and ALT are noted to be minimally elevated.  Possibly from alcohol use.  No recent imaging study of the hepatobiliary system noted in EMR.   Since levels are reasonably stable this can be pursued in the outpatient setting. Hepatitis panel is  unremarkable.   Tobacco use, history of emphysema Continue nebs as needed Respiratory status is stable.   Severe protein calorie malnutrition Nutrition Problem: Severe Malnutrition Etiology: social / environmental circumstances Signs/Symptoms: severe fat  depletion, severe muscle depletion   Patient is stable.  Okay for discharge to SNF.   PERTINENT LABS:  The results of significant diagnostics from this hospitalization (including imaging, microbiology, ancillary and laboratory) are listed below for reference.     Labs:   Basic Metabolic Panel: No results for input(s): "NA", "K", "CL", "CO2", "GLUCOSE", "BUN", "CREATININE", "CALCIUM", "MG", "PHOS" in the last 168 hours.  Liver Function Tests: No results for input(s): "AST", "ALT", "ALKPHOS", "BILITOT", "PROT", "ALBUMIN" in the last 168 hours.   CBC: No results for input(s): "WBC", "NEUTROABS", "HGB", "HCT", "MCV", "PLT" in the last 168 hours.    CBG: Recent Labs  Lab 12/18/21 2123 12/19/21 0616 12/19/21 1125 12/19/21 1657 12/19/21 2139  GLUCAP 112* 111* 119* 120* 110*      IMAGING STUDIES DG Swallowing Func-Speech Pathology  Result Date: 11/28/2021 Table formatting from the original result was not included. Images from the original result were not included. Objective Swallowing Evaluation: Type of Study: MBS-Modified Barium Swallow Study  Patient Details Name: Brent Warren MRN: [id number] Date of Birth: 11/11/1960 Today's Date: 11/28/2021 Time: SLP Start Time (ACUTE ONLY): 34 -SLP Stop Time (ACUTE ONLY): 1045 SLP Time Calculation (min) (ACUTE ONLY): 25 min Past Medical History: Past Medical History: Diagnosis Date  Alcoholic ([place])   Chest pain 04/02/2017  COPD (chronic obstructive pulmonary disease) ([place])   Empyema of pleural space ([place]) 05/2015  ETOH abuse 11/06/2016  Hepatitis 05/22/2015  HTN (hypertension)   Immunization due 11/06/2016  Lung nodule, multiple 11/08/2015  Malnutrition of moderate degree 06/02/2015  Pneumonia 05/2015  Pulmonary emphysema ([place]) 11/06/2016  Tobacco abuse   Urge incontinence 11/06/2016 Past Surgical History: Past Surgical History: Procedure Laterality Date  arm surgery    DECORTICATION Right 06/03/2015  Procedure: DECORTICATION;  Surgeon: Melrose Nakayama, MD;  Location: [facility];  Service: Thoracic;  Laterality: Right;  VIDEO ASSISTED THORACOSCOPY (VATS)/EMPYEMA Right 06/03/2015  Procedure: VIDEO ASSISTED THORACOSCOPY (VATS)/EMPYEMA;  Surgeon: Melrose Nakayama, MD;  Location: [facility] OR;  Service: Thoracic;  Laterality: Right; HPI: 61 year old male with history of HTN, tobacco use, EtOH use who comes into the hospital with sudden onset slurred speech and right-sided weakness.  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   He started to develop increased withdrawal symptoms on 11/13  No data recorded  Recommendations for follow up therapy are one component of a multi-disciplinary discharge planning process, led by the attending physician.  Recommendations may be updated based on patient status, additional functional criteria and insurance authorization. Assessment / Plan / Recommendation   11/28/2021  12:30 PM Clinical Impressions Clinical Impression Pt demonstrates mild oral dysphagia with slow lingual mobility, but adequate to clear most of the bolus from the oral cavity. Pt does have decreased bolus cohesion, contributing to premature spillage and delayed swallowing present with all trials. No penetration or aspiration occurred. No residue, no significant weakness. 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 Thin liquids shold be toelrated if pt is sitting upright. Pills can be given whole in puree. Will f/u for tolerance.     11/28/2021  12:30 PM Treatment Recommendations Treatment Recommendations Therapy as outlined in treatment plan below     11/28/2021  12:30 PM Prognosis Prognosis for Safe Diet Advancement Good   11/28/2021  12:30 PM Diet Recommendations SLP Diet Recommendations Dysphagia 2 (Fine chop) solids;Thin liquid Liquid Administration via Cup;Straw Medication Administration Whole meds with puree Compensations Slow rate;Small sips/bites Postural Changes  Remain semi-upright after after feeds/meals (Comment);Seated upright at 90 degrees     11/28/2021  12:30 PM Other Recommendations Oral Care Recommendations Oral care BID Follow Up Recommendations Acute inpatient rehab (3hours/day)   11/28/2021  12:30 PM Frequency and Duration  Speech Therapy Frequency (ACUTE ONLY) min 2x/week Treatment Duration 2 weeks     11/28/2021  12:30 PM Oral Phase Oral Phase Impaired Oral - Nectar Cup Decreased bolus cohesion Oral - Nectar Straw Decreased bolus cohesion Oral - Thin Straw Decreased bolus cohesion Oral - Mech Soft Decreased bolus cohesion Oral - Pill [facility]    11/28/2021  12:30 PM Pharyngeal Phase Pharyngeal Phase Impaired Pharyngeal- Nectar Cup Delayed swallow initiation-pyriform sinuses Pharyngeal- Nectar Straw Delayed swallow initiation-pyriform sinuses Pharyngeal- Thin Straw Delayed swallow initiation-pyriform sinuses Pharyngeal- Puree Delayed swallow initiation-vallecula Pharyngeal- Mechanical Soft Delayed swallow initiation-vallecula     No data to display    DeBlois, Riley Nearing 11/28/2021, 1:06 PM                     DG CHEST PORT 1 VIEW  Result Date: 11/24/2021 CLINICAL DATA:  Fever EXAM: PORTABLE CHEST 1 VIEW COMPARISON:  Chest radiograph done on 06/16/2016, CT chest done on 10/20/2020 FINDINGS: Cardiac size is within normal limits. Distal portion of enteric tube is seen in the stomach. Subtle increased interstitial markings are seen in left lower lung field. There is no pleural effusion or pneumothorax. IMPRESSION: Increased interstitial markings in the left lower lung fields suggest interstitial pneumonia. Electronically Signed   By: Ernie Avena M.D.   On: 11/24/2021 12:04    DISCHARGE EXAMINATION: Vitals:   12/22/21 1950 12/22/21 2151 12/23/21 0423 12/23/21 0500  BP: 115/74 131/85 116/78   Pulse: 85 80 75   Resp: 18  15   Temp: 98.3 F (36.8 C)  98.4 F (36.9 C)   TempSrc: Oral     SpO2: 98%  96%   Weight:    52.5 kg  Height:        Awake alert.  In no distress. Lungs are clear to auscultation bilaterally S1-S2 is normal regular. Abdomen is soft.  Nontender nondistended.   DISPOSITION: SNF  Discharge Instructions     Ambulatory referral to Neurology   Complete by: As directed    Follow up with stroke clinic NP (Jessica Vanschaick or Darrol Angel, if both not available, consider Manson Allan, or [facility]) at [facility] in about 4 weeks. Thanks.   Call MD for:  difficulty breathing, headache or visual disturbances   Complete by: As directed    Call MD for:  extreme fatigue   Complete by: As directed    Call MD for:  persistant dizziness or light-headedness   Complete by: As directed    Call MD for:  persistant nausea and vomiting   Complete by: As directed    Call MD for:  severe uncontrolled pain   Complete by: As directed    Call MD for:  temperature >100.4   Complete by: As directed    Diet - low sodium heart healthy   Complete by: As directed    Discharge instructions   Complete by: As directed    Please review instructions on the discharge summary.  You were cared  for by a hospitalist during your hospital stay. If you have any questions about your discharge medications or the care you received while you were in the hospital after you are discharged, you can call the unit and asked to speak with the hospitalist on call if the hospitalist that took care of you is not available. Once you are discharged, your primary care physician will handle any further medical issues. Please note that NO REFILLS for any discharge medications will be authorized once you are discharged, as it is imperative that you return to your primary care physician (or establish a relationship with a primary care physician if you do not have one) for your aftercare needs so that they can reassess your need for medications and monitor your lab values. If you do not have a primary care physician, you can call [phone] for a physician referral.    Increase activity slowly   Complete by: As directed    No wound care   Complete by: As directed           Allergies as of 12/23/2021   No Known Allergies      Medication List     STOP taking these medications    amLODipine 10 MG tablet Commonly known as: NORVASC   loperamide 2 MG tablet Commonly known as: Imodium A-D   meloxicam 15 MG tablet Commonly known as: MOBIC       TAKE these medications    acetaminophen 325 MG tablet Commonly known as: TYLENOL Take 2 tablets (650 mg total) by mouth every 4 (four) hours as needed for mild pain (or temp > 37.5 C (99.5 F)).   albuterol (2.5 MG/3ML) 0.083% nebulizer solution Commonly known as: PROVENTIL Take 3 mLs (2.5 mg total) by nebulization every 6 (six) hours as needed for wheezing or shortness of breath.   Ventolin HFA 108 (90 Base) MCG/ACT inhaler Generic drug: albuterol Inhale 2 puffs into the lungs every 6 (six) hours as needed for wheezing or shortness of breath.   cyanocobalamin 1000 MCG tablet Take 1 tablet (1,000 mcg total) by mouth daily.   feeding supplement Liqd Take 237 mLs by mouth 3 (three) times daily between meals.   folic acid 1 MG tablet Commonly known as: FOLVITE Take 1 tablet (1 mg total) by mouth daily.   magnesium oxide 400 (240 Mg) MG tablet Commonly known as: MAG-OX Take 1 tablet (400 mg total) by mouth daily.   metoprolol tartrate 50 MG tablet Commonly known as: LOPRESSOR Take 1 tablet (50 mg total) by mouth 2 (two) times daily.   multivitamin with minerals Tabs tablet Take 1 tablet by mouth daily.   pantoprazole 40 MG tablet Commonly known as: PROTONIX Take 1 tablet (40 mg total) by mouth daily.   polyethylene glycol 17 g packet Commonly known as: MIRALAX / GLYCOLAX Take 17 g by mouth daily.   QUEtiapine 50 MG tablet Commonly known as: SEROQUEL Take 1 tablet (50 mg total) by mouth 2 (two) times daily.   senna-docusate 8.6-50 MG tablet Commonly known as: Senokot-S Take  2 tablets by mouth 2 (two) times daily.   Symbicort 80-4.5 MCG/ACT inhaler Generic drug: budesonide-formoterol Inhale 2 puffs into the lungs 2 (two) times daily   thiamine 100 MG tablet Commonly known as: Vitamin Warren-1 Take 1 tablet (100 mg total) by mouth daily.          Follow-up Information     [facility]. Schedule an appointment as soon as possible for a  visit in 1 month(s).   Specialty: Neurology Why: stroke clinic Contact information: [address] [place] [address] [phone]        Brent Matar, MD. Schedule an appointment as soon as possible for a visit.   Specialty: Internal Medicine Why: post hospitalization follow up Contact information: [address] [place] [address] [place] [address] [phone]                 TOTAL DISCHARGE TIME: 35 minutes  Azucena Fallen DO  [facility] Pager on [email or web address]  12/23/2021, 7:24 AM

## 2021-12-24 DIAGNOSIS — J439 Emphysema, unspecified: Secondary | ICD-10-CM | POA: Diagnosis not present

## 2021-12-24 DIAGNOSIS — R718 Other abnormality of red blood cells: Secondary | ICD-10-CM | POA: Diagnosis not present

## 2021-12-24 DIAGNOSIS — F1010 Alcohol abuse, uncomplicated: Secondary | ICD-10-CM | POA: Diagnosis not present

## 2021-12-24 DIAGNOSIS — I610 Nontraumatic intracerebral hemorrhage in hemisphere, subcortical: Secondary | ICD-10-CM | POA: Diagnosis not present

## 2021-12-24 DIAGNOSIS — E871 Hypo-osmolality and hyponatremia: Secondary | ICD-10-CM | POA: Diagnosis not present

## 2021-12-24 DIAGNOSIS — I10 Essential (primary) hypertension: Secondary | ICD-10-CM | POA: Diagnosis not present

## 2021-12-24 DIAGNOSIS — F10931 Alcohol use, unspecified with withdrawal delirium: Secondary | ICD-10-CM | POA: Diagnosis not present

## 2021-12-24 DIAGNOSIS — E43 Unspecified severe protein-calorie malnutrition: Secondary | ICD-10-CM | POA: Diagnosis not present

## 2021-12-24 NOTE — Discharge Summary (Signed)
[facility]  Physician Discharge Summary   Patient ID: Brent Warren MRN: [id number] DOB/AGE: 09-28-1960 61 y.o.  Admit date: 11/18/2021 Discharge date:   12/24/2021   PCP: Ladell Pier, MD  DISCHARGE DIAGNOSES:    ICH (intracerebral hemorrhage) ([place])   Hyponatremia   HTN (hypertension)   Pulmonary emphysema ([place])   Tobacco abuse   ETOH abuse   Alcohol withdrawal delirium ([facility])    Protein-calorie malnutrition, severe   RECOMMENDATIONS FOR OUTPATIENT FOLLOW UP: Patient needs outpatient follow-up with neurology CBC and complete metabolic panel and magnesium level every week as diet progresses  Home Health: Going to SNF Equipment/Devices: None  CODE STATUS: Full code  DISCHARGE CONDITION: fair  Diet recommendation: Heart healthy  INITIAL HISTORY: 61 year old male with history of HTN, tobacco use, chronic alcohol use presented with sudden onset slurred speech and right-sided weakness.  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  He was admitted to the neuro ICU, remained stable and transferred to the hospitalist service on 11/13.  Patient also developed alcohol withdrawal during this hospitalization.   Patient remains medically stable for discharge - awaiting safe disposition  HOSPITAL COURSE:   Left basal ganglia ICH Neurology was following and signed off on 11/24/2021 and recommended outpatient follow-up with neurology Echo showed EF of 60 to 65%.  LDL 62, A1c is 5.1 PT/OT recommending CIR.  As per CIR, patient is not a good candidate for CIR.   Patient to go to skilled nursing facility.   Oropharyngeal dysphagia Initially was kept n.p.o. and started on nasogastric tube feedings.   Seen by speech therapy and diet was advanced.   Has had good caloric intake.  NG tube was removed on 12/3. Tolerating his diet.  Continue to encourage oral intake.   Essential hypertension Initially was on amlodipine and metoprolol.  Blood pressures  improved and then started trending low.   Amlodipine dose was initially decreased.  Blood pressure again noted to be borderline low.  Will discontinue it altogether.  Continue just metoprolol for now.     PSVT Telemetry showed brief episodes of supraventricular tachycardia on 11/23. Echocardiogram shows normal systolic function.   TSH noted to be 5.0 with a normal free T4 of 0.77. Patient was started on beta-blocker.  Telemetry showed improvement.  Subsequently taken off of telemetry.   Agitation Seems to be stable on Seroquel.  Has been off of restraints for several days now.   Alcohol abuse, alcohol withdrawal with delirium tremens Patient was noted to have significant alcohol withdrawal symptoms.  PCCM consulted and have signed off.  Completed phenobarbital taper.  Vitamin B1 normal Continue thiamine, folic acid and multivitamin   Hypokalemia/hyponatremia/hypomagnesemia Monitor electrolytes periodically.  On magnesium oxide.  Dose of magnesium oxide was decreased due to a magnesium level of 3.2.   Possible aspiration pneumonia/Possible UTI UA suggestive of UTI as well.  Cultures negative. Completed 5 days of Unasyn.   Respiratory status noted to be stable.     Vitamin B12 deficiency B12 177.  Folate 12.7.   Continue cyanocobalamine.     Macrocytic anemia Secondary to alcohol abuse as well as B12 deficiency. Ferritin noted to be 1074.  Iron 95.  TIBC 252.   Elevated LFTs AST and ALT are noted to be minimally elevated.  Possibly from alcohol use.  No recent imaging study of the hepatobiliary system noted in EMR.   Since levels are reasonably stable this can be pursued in the outpatient setting. Hepatitis panel is  unremarkable.   Tobacco use, history of emphysema Continue nebs as needed Respiratory status is stable.   Severe protein calorie malnutrition Nutrition Problem: Severe Malnutrition Etiology: social / environmental circumstances Signs/Symptoms: severe fat  depletion, severe muscle depletion   Patient is stable.  Okay for discharge to SNF.   PERTINENT LABS:  The results of significant diagnostics from this hospitalization (including imaging, microbiology, ancillary and laboratory) are listed below for reference.     Labs:   Basic Metabolic Panel: No results for input(s): "NA", "K", "CL", "CO2", "GLUCOSE", "BUN", "CREATININE", "CALCIUM", "MG", "PHOS" in the last 168 hours.  Liver Function Tests: No results for input(s): "AST", "ALT", "ALKPHOS", "BILITOT", "PROT", "ALBUMIN" in the last 168 hours.   CBC: No results for input(s): "WBC", "NEUTROABS", "HGB", "HCT", "MCV", "PLT" in the last 168 hours.    CBG: Recent Labs  Lab 12/18/21 2123 12/19/21 0616 12/19/21 1125 12/19/21 1657 12/19/21 2139  GLUCAP 112* 111* 119* 120* 110*      IMAGING STUDIES DG Swallowing Func-Speech Pathology  Result Date: 11/28/2021 Table formatting from the original result was not included. Images from the original result were not included. Objective Swallowing Evaluation: Type of Study: MBS-Modified Barium Swallow Study  Patient Details Name: Brent Warren MRN: [id number] Date of Birth: 11/11/1960 Today's Date: 11/28/2021 Time: SLP Start Time (ACUTE ONLY): 34 -SLP Stop Time (ACUTE ONLY): 1045 SLP Time Calculation (min) (ACUTE ONLY): 25 min Past Medical History: Past Medical History: Diagnosis Date  Alcoholic ([place])   Chest pain 04/02/2017  COPD (chronic obstructive pulmonary disease) ([place])   Empyema of pleural space ([place]) 05/2015  ETOH abuse 11/06/2016  Hepatitis 05/22/2015  HTN (hypertension)   Immunization due 11/06/2016  Lung nodule, multiple 11/08/2015  Malnutrition of moderate degree 06/02/2015  Pneumonia 05/2015  Pulmonary emphysema ([place]) 11/06/2016  Tobacco abuse   Urge incontinence 11/06/2016 Past Surgical History: Past Surgical History: Procedure Laterality Date  arm surgery    DECORTICATION Right 06/03/2015  Procedure: DECORTICATION;  Surgeon: Melrose Nakayama, MD;  Location: [facility];  Service: Thoracic;  Laterality: Right;  VIDEO ASSISTED THORACOSCOPY (VATS)/EMPYEMA Right 06/03/2015  Procedure: VIDEO ASSISTED THORACOSCOPY (VATS)/EMPYEMA;  Surgeon: Melrose Nakayama, MD;  Location: [facility] OR;  Service: Thoracic;  Laterality: Right; HPI: 61 year old male with history of HTN, tobacco use, EtOH use who comes into the hospital with sudden onset slurred speech and right-sided weakness.  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   He started to develop increased withdrawal symptoms on 11/13  No data recorded  Recommendations for follow up therapy are one component of a multi-disciplinary discharge planning process, led by the attending physician.  Recommendations may be updated based on patient status, additional functional criteria and insurance authorization. Assessment / Plan / Recommendation   11/28/2021  12:30 PM Clinical Impressions Clinical Impression Pt demonstrates mild oral dysphagia with slow lingual mobility, but adequate to clear most of the bolus from the oral cavity. Pt does have decreased bolus cohesion, contributing to premature spillage and delayed swallowing present with all trials. No penetration or aspiration occurred. No residue, no significant weakness. 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 Thin liquids shold be toelrated if pt is sitting upright. Pills can be given whole in puree. Will f/u for tolerance.     11/28/2021  12:30 PM Treatment Recommendations Treatment Recommendations Therapy as outlined in treatment plan below     11/28/2021  12:30 PM Prognosis Prognosis for Safe Diet Advancement Good   11/28/2021  12:30 PM Diet Recommendations SLP Diet Recommendations Dysphagia 2 (Fine chop) solids;Thin liquid Liquid Administration via Cup;Straw Medication Administration Whole meds with puree Compensations Slow rate;Small sips/bites Postural Changes  Remain semi-upright after after feeds/meals (Comment);Seated upright at 90 degrees     11/28/2021  12:30 PM Other Recommendations Oral Care Recommendations Oral care BID Follow Up Recommendations Acute inpatient rehab (3hours/day)   11/28/2021  12:30 PM Frequency and Duration  Speech Therapy Frequency (ACUTE ONLY) min 2x/week Treatment Duration 2 weeks     11/28/2021  12:30 PM Oral Phase Oral Phase Impaired Oral - Nectar Cup Decreased bolus cohesion Oral - Nectar Straw Decreased bolus cohesion Oral - Thin Straw Decreased bolus cohesion Oral - Mech Soft Decreased bolus cohesion Oral - Pill [facility]    11/28/2021  12:30 PM Pharyngeal Phase Pharyngeal Phase Impaired Pharyngeal- Nectar Cup Delayed swallow initiation-pyriform sinuses Pharyngeal- Nectar Straw Delayed swallow initiation-pyriform sinuses Pharyngeal- Thin Straw Delayed swallow initiation-pyriform sinuses Pharyngeal- Puree Delayed swallow initiation-vallecula Pharyngeal- Mechanical Soft Delayed swallow initiation-vallecula     No data to display    DeBlois, Katherene Ponto 11/28/2021, 1:06 PM                     DG CHEST PORT 1 VIEW  Result Date: 11/24/2021 CLINICAL DATA:  Fever EXAM: PORTABLE CHEST 1 VIEW COMPARISON:  Chest radiograph done on 06/16/2016, CT chest done on 10/20/2020 FINDINGS: Cardiac size is within normal limits. Distal portion of enteric tube is seen in the stomach. Subtle increased interstitial markings are seen in left lower lung field. There is no pleural effusion or pneumothorax. IMPRESSION: Increased interstitial markings in the left lower lung fields suggest interstitial pneumonia. Electronically Signed   By: Elmer Picker M.D.   On: 11/24/2021 12:04    DISCHARGE EXAMINATION: Vitals:   12/23/21 1938 12/23/21 2226 12/24/21 0449 12/24/21 0500  BP: (!) 128/93 113/66 125/76   Pulse: 88 80 74   Resp: 18  18   Temp: 98.5 F (36.9 C)  98.4 F (36.9 C)   TempSrc: Oral  Oral   SpO2: 98%  95%   Weight:    54.9 kg  Height:        Awake alert.  In no distress. Lungs are clear to auscultation bilaterally S1-S2 is normal regular. Abdomen is soft.  Nontender nondistended.   DISPOSITION: SNF  Discharge Instructions     Ambulatory referral to Neurology   Complete by: As directed    Follow up with stroke clinic NP (Jessica Vanschaick or Cecille Rubin, if both not available, consider Zachery Dauer, or [place]) at [facility] in about 4 weeks. Thanks.   Call MD for:  difficulty breathing, headache or visual disturbances   Complete by: As directed    Call MD for:  extreme fatigue   Complete by: As directed    Call MD for:  persistant dizziness or light-headedness   Complete by: As directed    Call MD for:  persistant nausea and vomiting   Complete by: As directed    Call MD for:  severe uncontrolled pain   Complete by: As directed    Call MD for:  temperature >100.4   Complete by: As directed    Diet - low sodium heart healthy   Complete by: As directed    Discharge instructions   Complete by: As directed    Please review instructions on the discharge summary.  You were  cared for by a hospitalist during your hospital stay. If you have any questions about your discharge medications or the care you received while you were in the hospital after you are discharged, you can call the unit and asked to speak with the hospitalist on call if the hospitalist that took care of you is not available. Once you are discharged, your primary care physician will handle any further medical issues. Please note that NO REFILLS for any discharge medications will be authorized once you are discharged, as it is imperative that you return to your primary care physician (or establish a relationship with a primary care physician if you do not have one) for your aftercare needs so that they can reassess your need for medications and monitor your lab values. If you do not have a primary care physician, you can call [phone] for a physician referral.    Increase activity slowly   Complete by: As directed    No wound care   Complete by: As directed           Allergies as of 12/24/2021   No Known Allergies      Medication List     STOP taking these medications    amLODipine 10 MG tablet Commonly known as: NORVASC   loperamide 2 MG tablet Commonly known as: Imodium A-D   meloxicam 15 MG tablet Commonly known as: MOBIC       TAKE these medications    acetaminophen 325 MG tablet Commonly known as: TYLENOL Take 2 tablets (650 mg total) by mouth every 4 (four) hours as needed for mild pain (or temp > 37.5 C (99.5 F)).   albuterol (2.5 MG/3ML) 0.083% nebulizer solution Commonly known as: PROVENTIL Take 3 mLs (2.5 mg total) by nebulization every 6 (six) hours as needed for wheezing or shortness of breath.   Ventolin HFA 108 (90 Base) MCG/ACT inhaler Generic drug: albuterol Inhale 2 puffs into the lungs every 6 (six) hours as needed for wheezing or shortness of breath.   cyanocobalamin 1000 MCG tablet Take 1 tablet (1,000 mcg total) by mouth daily.   feeding supplement Liqd Take 237 mLs by mouth 3 (three) times daily between meals.   folic acid 1 MG tablet Commonly known as: FOLVITE Take 1 tablet (1 mg total) by mouth daily.   magnesium oxide 400 (240 Mg) MG tablet Commonly known as: MAG-OX Take 1 tablet (400 mg total) by mouth daily.   metoprolol tartrate 50 MG tablet Commonly known as: LOPRESSOR Take 1 tablet (50 mg total) by mouth 2 (two) times daily.   multivitamin with minerals Tabs tablet Take 1 tablet by mouth daily.   pantoprazole 40 MG tablet Commonly known as: PROTONIX Take 1 tablet (40 mg total) by mouth daily.   polyethylene glycol 17 g packet Commonly known as: MIRALAX / GLYCOLAX Take 17 g by mouth daily.   QUEtiapine 50 MG tablet Commonly known as: SEROQUEL Take 1 tablet (50 mg total) by mouth 2 (two) times daily.   senna-docusate 8.6-50 MG tablet Commonly known as: Senokot-S Take  2 tablets by mouth 2 (two) times daily.   Symbicort 80-4.5 MCG/ACT inhaler Generic drug: budesonide-formoterol Inhale 2 puffs into the lungs 2 (two) times daily   thiamine 100 MG tablet Commonly known as: Vitamin B-1 Take 1 tablet (100 mg total) by mouth daily.          Follow-up Information     [facility]. Schedule an appointment as soon as possible for  a visit in 1 month(s).   Specialty: Neurology Why: stroke clinic Contact information: [address] [place] [phone]        Ladell Pier, MD. Schedule an appointment as soon as possible for a visit.   Specialty: Internal Medicine Why: post hospitalization follow up Contact information: [address] [place] [address] [phone]                 TOTAL DISCHARGE TIME: 35 minutes  [facility] Pager on [email or web address]  12/24/2021, 7:46 AM

## 2021-12-24 NOTE — Plan of Care (Signed)
  Problem: Education: Goal: Knowledge of disease or condition will improve Outcome: Progressing Goal: Knowledge of secondary prevention will improve (MUST DOCUMENT ALL) Outcome: Progressing Goal: Knowledge of patient specific risk factors will improve Loraine Leriche N/A or DELETE if not current risk factor) Outcome: Progressing   Problem: Coping: Goal: Will verbalize positive feelings about self Outcome: Progressing

## 2021-12-25 DIAGNOSIS — J439 Emphysema, unspecified: Secondary | ICD-10-CM | POA: Diagnosis not present

## 2021-12-25 DIAGNOSIS — E871 Hypo-osmolality and hyponatremia: Secondary | ICD-10-CM | POA: Diagnosis not present

## 2021-12-25 DIAGNOSIS — I10 Essential (primary) hypertension: Secondary | ICD-10-CM | POA: Diagnosis not present

## 2021-12-25 DIAGNOSIS — R718 Other abnormality of red blood cells: Secondary | ICD-10-CM | POA: Diagnosis not present

## 2021-12-25 DIAGNOSIS — F10931 Alcohol use, unspecified with withdrawal delirium: Secondary | ICD-10-CM | POA: Diagnosis not present

## 2021-12-25 DIAGNOSIS — I610 Nontraumatic intracerebral hemorrhage in hemisphere, subcortical: Secondary | ICD-10-CM | POA: Diagnosis not present

## 2021-12-25 DIAGNOSIS — E43 Unspecified severe protein-calorie malnutrition: Secondary | ICD-10-CM | POA: Diagnosis not present

## 2021-12-25 NOTE — Discharge Summary (Signed)
[facility]  Physician Discharge Summary   Patient ID: Brent Warren MRN: [id number] DOB/AGE: [birth year] 61 y.o.  Admit date: 11/18/2021 Discharge date:   61 years old male with history of HTN, tobacco use, chronic alcohol use presented with sudden onset slurred speech and right-sided weakness.   12/25/2021   PCP: Marcine Matar, MD  DISCHARGE DIAGNOSES:    ICH (intracerebral hemorrhage) (HCC)   Hyponatremia   HTN (hypertension)   Pulmonary emphysema (HCC)   Tobacco abuse   ETOH abuse   Alcohol withdrawal delirium (HCC)    Protein-calorie malnutrition, severe   RECOMMENDATIONS FOR OUTPATIENT FOLLOW UP: Patient needs outpatient follow-up with neurology CBC and complete metabolic panel and magnesium level every week as diet progresses  Home Health: Going to SNF Equipment/Devices: None  CODE STATUS: Full code  DISCHARGE CONDITION: fair  Diet recommendation: Heart healthy  INITIAL HISTORY: 61 year old male with history of HTN, tobacco use, chronic alcohol use presented with sudden onset slurred speech and right-sided weakness.  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  He was admitted to the neuro ICU, remained stable and transferred to the hospitalist service on 11/13.  Patient also developed alcohol withdrawal during this hospitalization.   Patient remains medically stable for discharge - awaiting safe disposition  HOSPITAL COURSE:   Left basal ganglia ICH Neurology was following and signed off on 11/24/2021 and recommended outpatient follow-up with neurology Echo showed EF of 60 to 65%.  LDL 62, A1c is 5.1 PT/OT recommending CIR.  As per CIR, patient is not a good candidate for CIR.   Patient to go to skilled nursing facility.   Oropharyngeal dysphagia Initially was kept n.p.o. and started on nasogastric tube feedings.   Seen by speech therapy and diet was advanced.   Has had good caloric intake.  NG tube was removed on 12/3. Tolerating his diet.  Continue to encourage oral intake.   Essential hypertension Initially was on amlodipine and metoprolol.  Blood pressures  improved and then started trending low.   Amlodipine dose was initially decreased.  Blood pressure again noted to be borderline low.  Will discontinue it altogether.  Continue just metoprolol for now.     PSVT Telemetry showed brief episodes of supraventricular tachycardia on 11/23. Echocardiogram shows normal systolic function.   TSH noted to be 5.0 with a normal free T4 of 0.77. Patient was started on beta-blocker.  Telemetry showed improvement.  Subsequently taken off of telemetry.   Agitation Seems to be stable on Seroquel.  Has been off of restraints for several days now.   Alcohol abuse, alcohol withdrawal with delirium tremens Patient was noted to have significant alcohol withdrawal symptoms.  PCCM consulted and have signed off.  Completed phenobarbital taper.  Vitamin B1 normal Continue thiamine, folic acid and multivitamin   Hypokalemia/hyponatremia/hypomagnesemia Monitor electrolytes periodically.  On magnesium oxide.  Dose of magnesium oxide was decreased due to a magnesium level of 3.2.   Possible aspiration pneumonia/Possible UTI UA suggestive of UTI as well.  Cultures negative. Completed 5 days of Unasyn.   Respiratory status noted to be stable.     Vitamin B12 deficiency B12 177.  Folate 12.7.   Continue cyanocobalamine.     Macrocytic anemia Secondary to alcohol abuse as well as B12 deficiency. Ferritin noted to be 1074.  Iron 95.  TIBC 252.   Elevated LFTs AST and ALT are noted to be minimally elevated.  Possibly from alcohol use.  No recent imaging study of the hepatobiliary system noted in EMR.   Since levels are reasonably stable this can be pursued in the outpatient setting. Hepatitis panel is  unremarkable.   Tobacco use, history of emphysema Continue nebs as needed Respiratory status is stable.   Severe protein calorie malnutrition Nutrition Problem: Severe Malnutrition Etiology: social / environmental circumstances Signs/Symptoms: severe fat  depletion, severe muscle depletion   Patient is stable.  Okay for discharge to SNF.   PERTINENT LABS:  The results of significant diagnostics from this hospitalization (including imaging, microbiology, ancillary and laboratory) are listed below for reference.     Labs:   Basic Metabolic Panel: No results for input(s): "NA", "K", "CL", "CO2", "GLUCOSE", "BUN", "CREATININE", "CALCIUM", "MG", "PHOS" in the last 168 hours.  Liver Function Tests: No results for input(s): "AST", "ALT", "ALKPHOS", "BILITOT", "PROT", "ALBUMIN" in the last 168 hours.   CBC: No results for input(s): "WBC", "NEUTROABS", "HGB", "HCT", "MCV", "PLT" in the last 168 hours.    CBG: Recent Labs  Lab 12/18/21 2123 12/19/21 0616 12/19/21 1125 12/19/21 1657 12/19/21 2139  GLUCAP 112* 111* 119* 120* 110*      IMAGING STUDIES DG Swallowing Func-Speech Pathology  Result Date: 11/28/2021 Table formatting from the original result was not included. Images from the original result were not included. Objective Swallowing Evaluation: Type of Study: MBS-Modified Barium Swallow Study  Patient Details Name: Brent Warren MRN: [id number] Date of Birth: 11/11/1960 Today's Date: 11/28/2021 Time: SLP Start Time (ACUTE ONLY): 34 -SLP Stop Time (ACUTE ONLY): 1045 SLP Time Calculation (min) (ACUTE ONLY): 25 min Past Medical History: Past Medical History: Diagnosis Date  Alcoholic ([place])   Chest pain 04/02/2017  COPD (chronic obstructive pulmonary disease) ([place])   Empyema of pleural space ([place]) 05/2015  ETOH abuse 11/06/2016  Hepatitis 05/22/2015  HTN (hypertension)   Immunization due 11/06/2016  Lung nodule, multiple 11/08/2015  Malnutrition of moderate degree 06/02/2015  Pneumonia 05/2015  Pulmonary emphysema ([place]) 11/06/2016  Tobacco abuse   Urge incontinence 11/06/2016 Past Surgical History: Past Surgical History: Procedure Laterality Date  arm surgery    DECORTICATION Right 06/03/2015  Procedure: DECORTICATION;  Surgeon: Melrose Nakayama, MD;  Location: [facility];  Service: Thoracic;  Laterality: Right;  VIDEO ASSISTED THORACOSCOPY (VATS)/EMPYEMA Right 06/03/2015  Procedure: VIDEO ASSISTED THORACOSCOPY (VATS)/EMPYEMA;  Surgeon: Melrose Nakayama, MD;  Location: [facility] OR;  Service: Thoracic;  Laterality: Right; HPI: 61 year old male with history of HTN, tobacco use, EtOH use who comes into the hospital with sudden onset slurred speech and right-sided weakness.  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   He started to develop increased withdrawal symptoms on 11/13  No data recorded  Recommendations for follow up therapy are one component of a multi-disciplinary discharge planning process, led by the attending physician.  Recommendations may be updated based on patient status, additional functional criteria and insurance authorization. Assessment / Plan / Recommendation   11/28/2021  12:30 PM Clinical Impressions Clinical Impression Pt demonstrates mild oral dysphagia with slow lingual mobility, but adequate to clear most of the bolus from the oral cavity. Pt does have decreased bolus cohesion, contributing to premature spillage and delayed swallowing present with all trials. No penetration or aspiration occurred. No residue, no significant weakness. 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 Thin liquids shold be toelrated if pt is sitting upright. Pills can be given whole in puree. Will f/u for tolerance.     11/28/2021  12:30 PM Treatment Recommendations Treatment Recommendations Therapy as outlined in treatment plan below     11/28/2021  12:30 PM Prognosis Prognosis for Safe Diet Advancement Good   11/28/2021  12:30 PM Diet Recommendations SLP Diet Recommendations Dysphagia 2 (Fine chop) solids;Thin liquid Liquid Administration via Cup;Straw Medication Administration Whole meds with puree Compensations Slow rate;Small sips/bites Postural Changes  Remain semi-upright after after feeds/meals (Comment);Seated upright at 90 degrees     11/28/2021  12:30 PM Other Recommendations Oral Care Recommendations Oral care BID Follow Up Recommendations Acute inpatient rehab (3hours/day)   11/28/2021  12:30 PM Frequency and Duration  Speech Therapy Frequency (ACUTE ONLY) min 2x/week Treatment Duration 2 weeks     11/28/2021  12:30 PM Oral Phase Oral Phase Impaired Oral - Nectar Cup Decreased bolus cohesion Oral - Nectar Straw Decreased bolus cohesion Oral - Thin Straw Decreased bolus cohesion Oral - Mech Soft Decreased bolus cohesion Oral - Pill [facility]    11/28/2021  12:30 PM Pharyngeal Phase Pharyngeal Phase Impaired Pharyngeal- Nectar Cup Delayed swallow initiation-pyriform sinuses Pharyngeal- Nectar Straw Delayed swallow initiation-pyriform sinuses Pharyngeal- Thin Straw Delayed swallow initiation-pyriform sinuses Pharyngeal- Puree Delayed swallow initiation-vallecula Pharyngeal- Mechanical Soft Delayed swallow initiation-vallecula     No data to display    DeBlois, Riley Nearing 11/28/2021, 1:06 PM                      DISCHARGE EXAMINATION: Vitals:   12/24/21 0904 12/24/21 1617 12/24/21 2003 12/25/21 0317  BP: 118/85 119/69 109/71 120/81  Pulse: 86 83 82 80  Resp: 16 16 18 17   Temp: 97.9 F (36.6 C) 97.9 F (36.6 C) 98.1 F (36.7 C) 97.9 F (36.6 C)  TempSrc: Oral Oral Oral Oral  SpO2: 96% 96% 95% 96%  Weight:      Height:       Awake alert.  In no distress. Lungs are clear to auscultation bilaterally S1-S2 is normal regular. Abdomen is soft.  Nontender nondistended.   DISPOSITION: SNF  Discharge Instructions     Ambulatory referral to Neurology   Complete by: As directed    Follow up with stroke clinic NP (Jessica Vanschaick or , if both not available, consider Darrol Angel, or [facility]) at [facility] in about 4 weeks. Thanks.   Call MD for:  difficulty breathing, headache or visual disturbances   Complete by: As directed     Call MD for:  extreme fatigue   Complete by: As directed    Call MD for:  persistant dizziness or light-headedness   Complete by: As directed    Call MD for:  persistant nausea and vomiting   Complete by: As directed    Call MD for:  severe uncontrolled pain   Complete by: As directed    Call MD for:  temperature >100.4   Complete by: As directed    Diet - low sodium heart healthy   Complete by: As directed    Discharge instructions   Complete by: As directed    Please review instructions on the discharge summary.  You were cared for by a hospitalist during your hospital stay. If you have any questions about your discharge medications or the care you received while you were in the hospital after you are discharged, you can call the unit and asked to speak with the hospitalist on call if the hospitalist that took care of you is not available. Once you are discharged, your primary care physician will handle any further medical issues. Please note that NO REFILLS for any discharge medications will be authorized once you are discharged, as it  is imperative that you return to your primary care physician (or establish a relationship with a primary care physician if you do not have one) for your aftercare needs so that they can reassess your need for medications and monitor your lab values. If you do not have a primary care physician, you can call [phone] for a physician referral.   Increase activity slowly   Complete by: As directed    No wound care   Complete by: As directed           Allergies as of 12/25/2021   No Known Allergies      Medication List     STOP taking these medications    amLODipine 10 MG tablet Commonly known as: NORVASC   loperamide 2 MG tablet Commonly known as: Imodium A-D   meloxicam 15 MG tablet Commonly known as: MOBIC       TAKE these medications    acetaminophen 325 MG tablet Commonly known as: TYLENOL Take 2 tablets (650 mg total) by mouth  every 4 (four) hours as needed for mild pain (or temp > 37.5 C (99.5 F)).   albuterol (2.5 MG/3ML) 0.083% nebulizer solution Commonly known as: PROVENTIL Take 3 mLs (2.5 mg total) by nebulization every 6 (six) hours as needed for wheezing or shortness of breath.   Ventolin HFA 108 (90 Base) MCG/ACT inhaler Generic drug: albuterol Inhale 2 puffs into the lungs every 6 (six) hours as needed for wheezing or shortness of breath.   cyanocobalamin 1000 MCG tablet Take 1 tablet (1,000 mcg total) by mouth daily.   feeding supplement Liqd Take 237 mLs by mouth 3 (three) times daily between meals.   folic acid 1 MG tablet Commonly known as: FOLVITE Take 1 tablet (1 mg total) by mouth daily.   magnesium oxide 400 (240 Mg) MG tablet Commonly known as: MAG-OX Take 1 tablet (400 mg total) by mouth daily.   metoprolol tartrate 50 MG tablet Commonly known as: LOPRESSOR Take 1 tablet (50 mg total) by mouth 2 (two) times daily.   multivitamin with minerals Tabs tablet Take 1 tablet by mouth daily.   pantoprazole 40 MG tablet Commonly known as: PROTONIX Take 1 tablet (40 mg total) by mouth daily.   polyethylene glycol 17 g packet Commonly known as: MIRALAX / GLYCOLAX Take 17 g by mouth daily.   QUEtiapine 50 MG tablet Commonly known as: SEROQUEL Take 1 tablet (50 mg total) by mouth 2 (two) times daily.   senna-docusate 8.6-50 MG tablet Commonly known as: Senokot-S Take 2 tablets by mouth 2 (two) times daily.   Symbicort 80-4.5 MCG/ACT inhaler Generic drug: budesonide-formoterol Inhale 2 puffs into the lungs 2 (two) times daily   thiamine 100 MG tablet Commonly known as: Vitamin B-1 Take 1 tablet (100 mg total) by mouth daily.          Follow-up Information     [facility]. Schedule an appointment as soon as possible for a visit in 1 month(s).   Specialty: Neurology Why: stroke clinic Contact information: [address] [place] [id number] [phone]        [name], [name], MD. Schedule an appointment as soon as possible for a visit.   Specialty: Internal Medicine Why: post hospitalization follow up Contact information: [address] [place] [address] [phone]                 TOTAL DISCHARGE TIME: 35 minutes  Chrissie NoaWilliam  Patrici Ranks DO  [facility] Pager on [email or web address]  12/25/2021, 7:21 AM

## 2021-12-25 NOTE — TOC Progression Note (Signed)
Transition of Care [facility]) - Initial/Assessment Note    Patient Details  Name: Brent Warren MRN: [id number] Date of Birth: 1960-07-25  [facility] of [facility] CM/SW Contact:    Ralene Bathe, LCSWA Phone Number: 12/25/2021, 11:17 AM  Clinical Narrative:                 LCSW contacted Debbie with admissions and [facility] to inquire about the status of insurance authorization and is awaiting a returned call.    TOC following.  Expected Discharge Plan: Skilled Nursing Facility Barriers to Discharge: Continued Medical Work up, No SNF bed, SNF Pending transportation   Patient Goals and CMS Choice Patient states their goals for this hospitalization and ongoing recovery are:: Patient states he does not know what his goals are for this hospitalization CMS Medicare.gov Compare Post Acute Care list provided to:: Patient Choice offered to / list presented to : Patient  Expected Discharge Plan and Services Expected Discharge Plan: Skilled Nursing Facility     Post Acute Care Choice: Skilled Nursing Facility Living arrangements for the past 2 months: Single Family Home Expected Discharge Date: 12/18/21                                    Prior Living Arrangements/Services Living arrangements for the past 2 months: Single Family Home Lives with:: Siblings Patient language and need for interpreter reviewed:: Yes Do you feel safe going back to the place where you live?: Yes      Need for Family Participation in Patient Care: Yes (Comment) Care giver support system in place?: Yes (comment)   Criminal Activity/Legal Involvement Pertinent to Current Situation/Hospitalization: No - Comment as needed  Activities of Daily Living Home Assistive Devices/Equipment: None ADL Screening (condition at time of admission) Patient's cognitive ability adequate to safely complete daily activities?: Yes Is the patient deaf or have difficulty hearing?: No Does the patient have difficulty  seeing, even when wearing glasses/contacts?: No Does the patient have difficulty concentrating, remembering, or making decisions?: No Patient able to express need for assistance with ADLs?: Yes Does the patient have difficulty dressing or bathing?: No Independently performs ADLs?: Yes (appropriate for developmental age) Does the patient have difficulty walking or climbing stairs?: No Weakness of Legs: Right Weakness of Arms/Hands: Right  Permission Sought/Granted Permission sought to share information with : Facility Medical sales representative, Family Supports Permission granted to share information with : Yes, Verbal Permission Granted  Share Information with NAME: Brent Warren     Permission granted to share info w Relationship: Brother  Permission granted to share info w Contact Information: [phone]  Emotional Assessment   Attitude/Demeanor/Rapport: Engaged Affect (typically observed): Adaptable Orientation: : Oriented to Self, Oriented to Place (Pt was not aware of current month and believed it to be October) Alcohol / Substance Use: Alcohol Use (pt reports drinking 4-6 cans of beer daily) Psych Involvement: No (comment)  Admission diagnosis:  ICH (intracerebral hemorrhage) (HCC) [I61.9] Left-sided nontraumatic intracerebral hemorrhage, unspecified cerebral location [facility]) [I61.9] Patient Active Problem List   Diagnosis Date Noted   Protein-calorie malnutrition, severe 11/22/2021   Alcohol withdrawal delirium (HCC) 11/20/2021   Elevated MCV 11/20/2021   ICH (intracerebral hemorrhage) (HCC) 11/18/2021   COVID-19 vaccine series completed 10/26/2019   Polyarthritis 02/05/2019   Chest pain in adult 04/02/2017   Immunization due 11/06/2016   Pulmonary emphysema (HCC) 11/06/2016   Urge incontinence 11/06/2016   Tobacco abuse  11/06/2016   ETOH abuse 11/06/2016   Lung nodule, multiple 11/08/2015   HTN (hypertension) 08/01/2015   Hyponatremia 05/22/2015   Hepatitis 05/22/2015    PCP:  Marcine Matar, MD Pharmacy:   [facility] [address], [address] [place] [address] Phone: [phone] Fax: [phone]  [place] [facility] [address] [place] [address] Phone: [phone] Fax: [phone]     Social Determinants of Health (SDOH) Interventions    Readmission Risk Interventions     No data to display

## 2021-12-26 DIAGNOSIS — I10 Essential (primary) hypertension: Secondary | ICD-10-CM | POA: Diagnosis not present

## 2021-12-26 DIAGNOSIS — R718 Other abnormality of red blood cells: Secondary | ICD-10-CM | POA: Diagnosis not present

## 2021-12-26 DIAGNOSIS — F10931 Alcohol use, unspecified with withdrawal delirium: Secondary | ICD-10-CM | POA: Diagnosis not present

## 2021-12-26 DIAGNOSIS — E43 Unspecified severe protein-calorie malnutrition: Secondary | ICD-10-CM | POA: Diagnosis not present

## 2021-12-26 DIAGNOSIS — J439 Emphysema, unspecified: Secondary | ICD-10-CM | POA: Diagnosis not present

## 2021-12-26 DIAGNOSIS — E871 Hypo-osmolality and hyponatremia: Secondary | ICD-10-CM | POA: Diagnosis not present

## 2021-12-26 DIAGNOSIS — I610 Nontraumatic intracerebral hemorrhage in hemisphere, subcortical: Secondary | ICD-10-CM | POA: Diagnosis not present

## 2021-12-26 NOTE — Discharge Summary (Signed)
[facility]  Physician Discharge Summary   Patient ID: Mrk Buzby MRN: [id number] DOB/AGE: 10-Dec-1960 61 y.o.  Admit date: 11/18/2021 Discharge date:   12/26/2021   PCP: Marcine Matar, MD  DISCHARGE DIAGNOSES:    ICH (intracerebral hemorrhage) (HCC)   Hyponatremia   HTN (hypertension)   Pulmonary emphysema (HCC)   Tobacco abuse   ETOH abuse   Alcohol withdrawal delirium (HCC)    Protein-calorie malnutrition, severe   RECOMMENDATIONS FOR OUTPATIENT FOLLOW UP: Patient needs outpatient follow-up with neurology CBC and complete metabolic panel and magnesium level every week as diet progresses  Home Health: Going to SNF Equipment/Devices: None  CODE STATUS: Full code  DISCHARGE CONDITION: fair  Diet recommendation: Heart healthy  INITIAL HISTORY: 61 year old male with history of HTN, tobacco use, chronic alcohol use presented with sudden onset slurred speech and right-sided weakness.  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  He was admitted to the neuro ICU, remained stable and transferred to the hospitalist service on 11/13.  Patient also developed alcohol withdrawal during this hospitalization.   Patient remains medically stable for discharge - awaiting safe disposition  HOSPITAL COURSE:   Left basal ganglia ICH Neurology was following and signed off on 11/24/2021 and recommended outpatient follow-up with neurology Echo showed EF of 60 to 65%.  LDL 62, A1c is 5.1 PT/OT recommending CIR.  As per CIR, patient is not a good candidate for CIR.   Patient to go to skilled nursing facility.   Oropharyngeal dysphagia Initially was kept n.p.o. and started on nasogastric tube feedings.   Seen by speech therapy and diet was advanced.   Has had good caloric intake.  NG tube was removed on 12/3. Tolerating his diet.  Continue to encourage oral intake.   Essential hypertension Initially was on amlodipine and metoprolol.  Blood pressures  improved and then started trending low.   Amlodipine dose was initially decreased.  Blood pressure again noted to be borderline low.  Will discontinue it altogether.  Continue just metoprolol for now.     PSVT Telemetry showed brief episodes of supraventricular tachycardia on 11/23. Echocardiogram shows normal systolic function.   TSH noted to be 5.0 with a normal free T4 of 0.77. Patient was started on beta-blocker.  Telemetry showed improvement.  Subsequently taken off of telemetry.   Agitation Seems to be stable on Seroquel.  Has been off of restraints for several days now.   Alcohol abuse, alcohol withdrawal with delirium tremens Patient was noted to have significant alcohol withdrawal symptoms.  PCCM consulted and have signed off.  Completed phenobarbital taper.  Vitamin B1 normal Continue thiamine, folic acid and multivitamin   Hypokalemia/hyponatremia/hypomagnesemia Monitor electrolytes periodically.  On magnesium oxide.  Dose of magnesium oxide was decreased due to a magnesium level of 3.2.   Possible aspiration pneumonia/Possible UTI UA suggestive of UTI as well.  Cultures negative. Completed 5 days of Unasyn.   Respiratory status noted to be stable.     Vitamin B12 deficiency B12 177.  Folate 12.7.   Continue cyanocobalamine.     Macrocytic anemia Secondary to alcohol abuse as well as B12 deficiency. Ferritin noted to be 1074.  Iron 95.  TIBC 252.   Elevated LFTs AST and ALT are noted to be minimally elevated.  Possibly from alcohol use.  No recent imaging study of the hepatobiliary system noted in EMR.   Since levels are reasonably stable this can be pursued in the outpatient setting. Hepatitis panel is  unremarkable.   Tobacco use, history of emphysema Continue nebs as needed Respiratory status is stable.   Severe protein calorie malnutrition Nutrition Problem: Severe Malnutrition Etiology: social / environmental circumstances Signs/Symptoms: severe fat  depletion, severe muscle depletion   Patient is stable.  Okay for discharge to SNF.   PERTINENT LABS:  The results of significant diagnostics from this hospitalization (including imaging, microbiology, ancillary and laboratory) are listed below for reference.     Labs:   Basic Metabolic Panel: No results for input(s): "NA", "K", "CL", "CO2", "GLUCOSE", "BUN", "CREATININE", "CALCIUM", "MG", "PHOS" in the last 168 hours.  Liver Function Tests: No results for input(s): "AST", "ALT", "ALKPHOS", "BILITOT", "PROT", "ALBUMIN" in the last 168 hours.   CBC: No results for input(s): "WBC", "NEUTROABS", "HGB", "HCT", "MCV", "PLT" in the last 168 hours.    CBG: Recent Labs  Lab 12/19/21 1125 12/19/21 1657 12/19/21 2139  GLUCAP 119* 120* 110*      IMAGING STUDIES DG Swallowing Func-Speech Pathology  Result Date: 11/28/2021 Table formatting from the original result was not included. Images from the original result were not included. 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;   Service: Thoracic;  Laterality: Right;  VIDEO ASSISTED THORACOSCOPY (VATS)/EMPYEMA Right 06/03/2015  Procedure: VIDEO ASSISTED THORACOSCOPY (VATS)/EMPYEMA;  Surgeon: Loreli SlotSteven C Hendrickson, MD;  [facility];  Service: Thoracic;  Laterality: Right; HPI: 61 year old male with history of HTN, tobacco use, EtOH use who comes into the hospital with sudden onset slurred speech and right-sided weakness.  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   He started to develop increased withdrawal symptoms on 11/13  No data recorded  Recommendations for follow up therapy are one component of a multi-disciplinary discharge planning process, led by the attending physician.  Recommendations may be updated based on patient status, additional functional criteria and insurance authorization. Assessment / Plan / Recommendation   11/28/2021  12:30 PM Clinical Impressions Clinical Impression Pt demonstrates mild oral dysphagia with slow lingual mobility, but adequate to clear most of the bolus from the oral cavity. Pt does have decreased bolus cohesion, contributing to premature spillage and delayed swallowing present with all trials. No penetration or aspiration occurred. No residue, no significant weakness. 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 Thin liquids shold be toelrated if pt is sitting upright. Pills can be given whole in puree. Will f/u for tolerance.     11/28/2021  12:30 PM Treatment Recommendations Treatment Recommendations Therapy as outlined in treatment plan below     11/28/2021  12:30 PM Prognosis Prognosis for Safe  Diet Advancement Good   11/28/2021  12:30 PM Diet Recommendations SLP Diet Recommendations Dysphagia 2 (Fine chop) solids;Thin liquid Liquid Administration via Cup;Straw Medication Administration Whole meds with puree Compensations Slow rate;Small sips/bites Postural Changes Remain semi-upright after after  feeds/meals (Comment);Seated upright at 90 degrees     11/28/2021  12:30 PM Other Recommendations Oral Care Recommendations Oral care BID Follow Up Recommendations Acute inpatient rehab (3hours/day)   11/28/2021  12:30 PM Frequency and Duration  Speech Therapy Frequency (ACUTE ONLY) min 2x/week Treatment Duration 2 weeks     11/28/2021  12:30 PM Oral Phase Oral Phase Impaired Oral - Nectar Cup Decreased bolus cohesion Oral - Nectar Straw Decreased bolus cohesion Oral - Thin Straw Decreased bolus cohesion Oral - Mech Soft Decreased bolus cohesion Oral - Pill [facility]    11/28/2021  12:30 PM Pharyngeal Phase Pharyngeal Phase Impaired Pharyngeal- Nectar Cup Delayed swallow initiation-pyriform sinuses Pharyngeal- Nectar Straw Delayed swallow initiation-pyriform sinuses Pharyngeal- Thin Straw Delayed swallow initiation-pyriform sinuses Pharyngeal- Puree Delayed swallow initiation-vallecula Pharyngeal- Mechanical Soft Delayed swallow initiation-vallecula     No data to display    DeBlois, Riley Nearing 11/28/2021, 1:06 PM                      DISCHARGE EXAMINATION: Vitals:   12/25/21 0317 12/25/21 0819 12/25/21 1946 12/26/21 0438  BP: 120/81 (!) 124/92 111/64 129/81  Pulse: 80 98 82 74  Resp: 17 16 17 19   Temp: 97.9 F (36.6 C) 98 F (36.7 C) 97.9 F (36.6 C) 98.2 F (36.8 C)  TempSrc: Oral Oral  Oral  SpO2: 96% 95% 98% 96%  Weight:      Height:       Awake alert.  In no distress. Lungs are clear to auscultation bilaterally S1-S2 is normal regular. Abdomen is soft.  Nontender nondistended.   DISPOSITION: SNF  Discharge Instructions     Ambulatory referral to Neurology   Complete by: As directed    Follow up with stroke clinic NP (Jessica Vanschaick or , if both not available, consider Darrol Angel, or [facility]) at [facility] in about 4 weeks. Thanks.   Call MD for:  difficulty breathing, headache or visual disturbances   Complete by: As directed    Call MD for:  extreme fatigue    Complete by: As directed    Call MD for:  persistant dizziness or light-headedness   Complete by: As directed    Call MD for:  persistant nausea and vomiting   Complete by: As directed    Call MD for:  severe uncontrolled pain   Complete by: As directed    Call MD for:  temperature >100.4   Complete by: As directed    Diet - low sodium heart healthy   Complete by: As directed    Discharge instructions   Complete by: As directed    Please review instructions on the discharge summary.  You were cared for by a hospitalist during your hospital stay. If you have any questions about your discharge medications or the care you received while you were in the hospital after you are discharged, you can call the unit and asked to speak with the hospitalist on call if the hospitalist that took care of you is not available. Once you are discharged, your primary care physician will handle any further medical issues. Please note that NO REFILLS for any discharge medications will be authorized once you are discharged, as it is imperative that you return  to your primary care physician (or establish a relationship with a primary care physician if you do not have one) for your aftercare needs so that they can reassess your need for medications and monitor your lab values. If you do not have a primary care physician, you can call [phone] for a physician referral.   Increase activity slowly   Complete by: As directed    No wound care   Complete by: As directed           Allergies as of 12/26/2021   No Known Allergies      Medication List     STOP taking these medications    amLODipine 10 MG tablet Commonly known as: NORVASC   loperamide 2 MG tablet Commonly known as: Imodium A-D   meloxicam 15 MG tablet Commonly known as: MOBIC       TAKE these medications    acetaminophen 325 MG tablet Commonly known as: TYLENOL Take 2 tablets (650 mg total) by mouth every 4 (four) hours as needed  for mild pain (or temp > 37.5 C (99.5 F)).   albuterol (2.5 MG/3ML) 0.083% nebulizer solution Commonly known as: PROVENTIL Take 3 mLs (2.5 mg total) by nebulization every 6 (six) hours as needed for wheezing or shortness of breath.   Ventolin HFA 108 (90 Base) MCG/ACT inhaler Generic drug: albuterol Inhale 2 puffs into the lungs every 6 (six) hours as needed for wheezing or shortness of breath.   cyanocobalamin 1000 MCG tablet Take 1 tablet (1,000 mcg total) by mouth daily.   feeding supplement Liqd Take 237 mLs by mouth 3 (three) times daily between meals.   folic acid 1 MG tablet Commonly known as: FOLVITE Take 1 tablet (1 mg total) by mouth daily.   magnesium oxide 400 (240 Mg) MG tablet Commonly known as: MAG-OX Take 1 tablet (400 mg total) by mouth daily.   metoprolol tartrate 50 MG tablet Commonly known as: LOPRESSOR Take 1 tablet (50 mg total) by mouth 2 (two) times daily.   multivitamin with minerals Tabs tablet Take 1 tablet by mouth daily.   pantoprazole 40 MG tablet Commonly known as: PROTONIX Take 1 tablet (40 mg total) by mouth daily.   polyethylene glycol 17 g packet Commonly known as: MIRALAX / GLYCOLAX Take 17 g by mouth daily.   QUEtiapine 50 MG tablet Commonly known as: SEROQUEL Take 1 tablet (50 mg total) by mouth 2 (two) times daily.   senna-docusate 8.6-50 MG tablet Commonly known as: Senokot-S Take 2 tablets by mouth 2 (two) times daily.   Symbicort 80-4.5 MCG/ACT inhaler Generic drug: budesonide-formoterol Inhale 2 puffs into the lungs 2 (two) times daily   thiamine 100 MG tablet Commonly known as: Vitamin B-1 Take 1 tablet (100 mg total) by mouth daily.          Follow-up Information     [facility]. Schedule an appointment as soon as possible for a visit in 1 month(s).   Specialty: Neurology Why: stroke clinic Contact information: [address] [place] [address] [phone]         Marcine Matar, MD. Schedule an appointment as soon as possible for a visit.   Specialty: Internal Medicine Why: post hospitalization follow up Contact information: [address] [place] [address] [phone]                 TOTAL DISCHARGE TIME: 35 minutes  Azucena Fallen DO  [facility] Pager on [email or web address]  12/26/2021, 7:24 AM

## 2021-12-26 NOTE — TOC Progression Note (Signed)
Transition of Care [facility]) - Progression Note    Patient Details  Name: Brent Warren MRN: [id number] Date of Birth: 1960/10/24  [facility]) CM/SW Contact  Janae Bridgeman, RN Phone Number: 12/26/2021, 12:37 PM  Clinical Narrative:    CM called and left a voicemail message with Eunice Blase, CM at [facility] to follow up regarding pending insurance authorization for SNF placement.  I was unable to reach her by phone but will follow up.   Expected Discharge Plan: Skilled Nursing Facility Barriers to Discharge: Continued Medical Work up, No SNF bed, SNF Pending transportation  Expected Discharge Plan and Services Expected Discharge Plan: Skilled Nursing Facility     Post Acute Care Choice: Skilled Nursing Facility Living arrangements for the past 2 months: Single Family Home Expected Discharge Date: 12/18/21                                     Social Determinants of Health (SDOH) Interventions    Readmission Risk Interventions     No data to display

## 2021-12-26 NOTE — Progress Notes (Signed)
Physical Therapy Treatment Patient Details Name: Brent Warren MRN: [id number] DOB: 01-Apr-1960 Today's Date: 12/26/2021   History of Present Illness 61 yo male admitted 11/11 with Rt sided weakness, facial droop and slurred speech. 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 PMH includes: HTN, COPD, and alcohol abuse.    PT Comments    Pt pleasant and able to progress to standing without assist and walking for the first time without UE support. PT would benefit from trial of cane next session. Pt was living with brother and with significant improvement pt could return home if family able to assist with IADLS. Will continue to follow.     Recommendations for follow up therapy are one component of a multi-disciplinary discharge planning process, led by the attending physician.  Recommendations may be updated based on patient status, additional functional criteria and insurance authorization.  Follow Up Recommendations  Skilled nursing-short term rehab (<3 hours/day) Can patient physically be transported by private vehicle: Yes   Assistance Recommended at Discharge Intermittent Supervision/Assistance  Patient can return home with the following A little help with bathing/dressing/bathroom;A little help with walking and/or transfers;Assistance with cooking/housework;Direct supervision/assist for medications management;Assist for transportation;Direct supervision/assist for financial management   Equipment Recommendations  Rolling walker (2 wheels)    Recommendations for Other Services       Precautions / Restrictions Precautions Precautions: Fall     Mobility  Bed Mobility Overal bed mobility: Modified Independent                  Transfers Overall transfer level: Modified independent                 General transfer comment: initially trying to pull up on RW with cues for hand placement and safety. Removed RW and pt able to successfully perform 10 sit to stands mod I     Ambulation/Gait Ambulation/Gait assistance: Min guard Gait Distance (Feet): 350 Feet Assistive device: None, 1 person hand held assist     Gait velocity interpretation: 1.31 - 2.62 ft/sec, indicative of limited community ambulator   General Gait Details: pt able to stand without UE support today and walked 100' without support with trunk flexion and cues for upright posture, remainder of gait with single HHA with improved stride and posture   Stairs             Wheelchair Mobility    Modified Rankin (Stroke Patients Only) Modified Rankin (Stroke Patients Only) Pre-Morbid Rankin Score: No symptoms Modified Rankin: Moderate disability     Balance Overall balance assessment: Needs assistance   Sitting balance-Leahy Scale: Good Sitting balance - Comments: static sitting without support     Standing balance-Leahy Scale: Fair Standing balance comment: pt able to stand at sink and toilet without UE support as well as limited gait with guarding                            Cognition Arousal/Alertness: Awake/alert Behavior During Therapy: WFL for tasks assessed/performed Overall Cognitive Status: Impaired/Different from baseline Area of Impairment: Safety/judgement, Memory                   Current Attention Level: Selective Memory: Decreased short-term memory Following Commands: Follows one step commands consistently                Exercises      General Comments        Pertinent Vitals/Pain Pain  Assessment Pain Assessment: No/denies pain    Home Living                          Prior Function            PT Goals (current goals can now be found in the care plan section) Progress towards PT goals: Progressing toward goals    Frequency    Min 2X/week      PT Plan      Co-evaluation              AM-PAC PT "6 Clicks" Mobility   Outcome Measure  Help needed turning from your back to your side while in a  flat bed without using bedrails?: None Help needed moving from lying on your back to sitting on the side of a flat bed without using bedrails?: None Help needed moving to and from a bed to a chair (including a wheelchair)?: A Little Help needed standing up from a chair using your arms (e.g., wheelchair or bedside chair)?: A Little Help needed to walk in hospital room?: A Little Help needed climbing 3-5 steps with a railing? : A Little 6 Click Score: 20    End of Session Equipment Utilized During Treatment: Gait belt Activity Tolerance: Patient tolerated treatment well Patient left: in chair;with call bell/phone within reach;with chair alarm set Nurse Communication: Mobility status PT Visit Diagnosis: Other abnormalities of gait and mobility (R26.89);Muscle weakness (generalized) (M62.81);Hemiplegia and hemiparesis     Time: 0953-1020 PT Time Calculation (min) (ACUTE ONLY): 27 min  Charges:  $Gait Training: 8-22 mins $Therapeutic Activity: 8-22 mins                     Brent Warren, PT Acute Rehabilitation Services Office: [phone]    Brent Warren Brent Warren 12/26/2021, 10:39 AM

## 2021-12-26 NOTE — TOC Progression Note (Addendum)
Transition of Care [facility]) - Progression Note    Patient Details  Name: Brent Warren MRN: [id number] Date of Birth: Apr 27, 1960  Transition of Care [facility]) CM/SW Contact  Curlene Labrum, RN Phone Number: 12/26/2021, 3:38 PM  Clinical Narrative:    Cm met with the patient at the bedside and the patient was able to mobilize with therapy today and therapy was agreeable that if patient's brother is able to assist with the patient's ADL's in the home that he could return home.  I called and left a voicemail message with the patient's brother since I was unable to reach him by phone.  The patient states that he will call and speak with his brother tonight.  The patient states that his brother does not work.  The patient states that he receives a 700.oo per month social security check and food stamps at this time.  The patient states that his brother drives and if agreeable for him to return to the home - the brother may be able to provide transportation to Outpatient therapy if needed.  CM will continue to follow the patient for return to home with family - pending answer from the family.  I also called and left a voicemail message with Vara Guardian, CM with [facility].  I also called and left a voicemail message with Loree Fee, admissions director with [facility] and [facility] for available male beds at either facility in case the patient's brother is unable to accept him at the home.   Expected Discharge Plan: Skilled Nursing Facility Barriers to Discharge: Continued Medical Work up  Expected Discharge Plan and Services Expected Discharge Plan: [place]   Discharge Planning Services: CM Consult Post Acute Care Choice: [place] Living arrangements for the past 2 months: Single Family Home (Patient lives with his brother at his home) Expected Discharge Date: 12/18/21                                     Social Determinants of Health  (SDOH) Interventions    Readmission Risk Interventions    12/26/2021    3:37 PM  Readmission Risk Prevention Plan  Transportation Screening Complete  PCP or Specialist Appt within 5-7 Days Complete  Home Care Screening Complete  Medication Review (RN CM) Complete

## 2021-12-27 DIAGNOSIS — I610 Nontraumatic intracerebral hemorrhage in hemisphere, subcortical: Secondary | ICD-10-CM | POA: Diagnosis not present

## 2021-12-27 NOTE — TOC Progression Note (Addendum)
Transition of Care [facility]) - Progression Note    Patient Details  Name: Brent Warren MRN: [id number] Date of Birth: 1960-09-04  Transition of Care [facility]) CM/SW Contact  Janae Bridgeman, RN Phone Number: 12/27/2021, 9:02 AM  Clinical Narrative:    CM called and spoke with Claudette Laws, MSW and she called and spoke with Eunice Blase, CM at [facility] to follow up regarding pending auth for facility at this time.  I called and spoke with the patient's brother and the patient is unable to care for the patient at his home.  CM and MSW with [facility] will continue to follow the patient for pending auth and placement at [facility]  12/27/21 1014 - Claudette Laws, MSW spoke with Eunice Blase, CM at [facility] and authorization was approved for SNF placement at the facility.  [facility] should have bed availability today or tomorrow on LTC unit and will notify TOC when patient can be discharged to the facility.  I updated the patient at the bedside for pending discharge to the facility once the bed is open for his transfer.  I called the patient's brother to update but was unable to leave a voicemail message.   Expected Discharge Plan: Skilled Nursing Facility Barriers to Discharge: Continued Medical Work up  Expected Discharge Plan and Services Expected Discharge Plan: Skilled Nursing Facility   Discharge Planning Services: CM Consult Post Acute Care Choice: Skilled Nursing Facility Living arrangements for the past 2 months: Single Family Home (Patient lives with his brother at his home) Expected Discharge Date: 12/18/21                                     Social Determinants of Health (SDOH) Interventions    Readmission Risk Interventions    12/26/2021    3:37 PM  Readmission Risk Prevention Plan  Transportation Screening Complete  PCP or Specialist Appt within 5-7 Days Complete  Home Care Screening Complete  Medication Review (RN CM) Complete

## 2021-12-27 NOTE — Progress Notes (Deleted)
Patient seen and examined at bedside today.  Hemodynamically stable.  Appears comfortable.  Sitting in chair, remains alert and oriented. Medically stable for discharge whenever possible.  Discharge summary and orders have already been placed.  No change in the medical management.  TOC following for SNF placement.TOC says he has a bed at SNF today

## 2021-12-27 NOTE — Progress Notes (Signed)
Occupational Therapy Treatment Patient Details Name: Brent Warren MRN: [id number] DOB: 1960/06/10 Today's Date: 12/27/2021   History of present illness 61 yo male admitted 11/11 with Rt sided weakness, facial droop and slurred speech. 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 PMH includes: HTN, COPD, and alcohol abuse.   OT comments  Pt making excellent progress. Mobilizing with S, completing ADL tasks with min A with multimodal cues and demonstrating improved use of dominant RUE as a functional assist with moderate cuing. Pt very receptive to all treatment strategies and is very motivated to become more independent. Feel pt will continue to make excellent progress with rehab at SNF to reach goal of discharging home. Acute OT to continue to follow.    Recommendations for follow up therapy are one component of a multi-disciplinary discharge planning process, led by the attending physician.  Recommendations may be updated based on patient status, additional functional criteria and insurance authorization.    Follow Up Recommendations  Skilled nursing-short term rehab (<3 hours/day)     Assistance Recommended at Discharge Frequent or constant Supervision/Assistance  Patient can return home with the following  A little help with walking and/or transfers;A little help with bathing/dressing/bathroom;Direct supervision/assist for medications management;Direct supervision/assist for financial management;Assist for transportation;Help with stairs or ramp for entrance   Equipment Recommendations  BSC/3in1;Wheelchair (measurements OT);Wheelchair cushion (measurements OT)    Recommendations for Other Services      Precautions / Restrictions Precautions Precautions: Fall       Mobility Bed Mobility Overal bed mobility: Modified Independent                  Transfers Overall transfer level: Needs assistance   Transfers: Sit to/from Stand Sit to Stand: Supervision                  Balance     Sitting balance-Leahy Scale: Good       Standing balance-Leahy Scale: Fair                             ADL either performed or assessed with clinical judgement   ADL                                       Functional mobility during ADLs: Supervision/safety General ADL Comments: focus of session on teaching hemi dressing techniques; able to return demonstrate with min A; completed oral care standing at sink using RUE as functional assist    Extremity/Trunk Assessment Upper Extremity Assessment Upper Extremity Assessment: RUE deficits/detail RUE Deficits / Details: moving RUE out of flexor synergy pattern; abnormal shoulder girdle movement with winging of scap noted; after ewight bearing through RUE at sink, able to isolate protraciton retraction and dression to work on strength and isolated control; pt hikes shoulder as compensatory strategy however responds well to tactile and verbal cues to correct; focus on controlled isolated movemetn without hiking shoulder with good cary over. Able to use hadn as functional assist with min tactle adn verble cues during ADL tasks; Ablet o maintain grsp on towel while walking RUE Coordination: decreased fine motor;decreased gross motor            Vision   Additional Comments: unabl eot read small print; may benefit from readers; able to read larger print without difficulty ; will continue to assess   Perception Perception  Perception:  (good awareness of midline; appears intact)   Praxis Praxis Praxis: Intact    Cognition Arousal/Alertness: Awake/alert Behavior During Therapy: WFL for tasks assessed/performed Overall Cognitive Status: Impaired/Different from baseline Area of Impairment: Attention, Safety/judgement, Awareness                   Current Attention Level: Alternating     Safety/Judgement: Decreased awareness of safety Awareness: Emergent   General Comments: significant  improvement in ability to incoroprate strategies with focus on errorless learning to facilitate improvement. All tasks imporved with repetition; pt apparenlty worried about his DC plan, demonstrating increased insight and awareness inot how is deficits affect his ability to function sfaely at home; pt incorporating learned strateigies form previous PT session        Exercises Exercises: Other exercises Other Exercises Other Exercises: weight bearing through R hand in stanidng at sink foloowed by isolating finger grasp/extension; focused on isolating movenet of RUE without hiking R shoulder; improved movemetn patterns with repetition Other Exercises: scapular strengthening in protraction/retraction and drepression. Other Exercises: gliding hand up/down thigh then bringing hand toward mouth wihtout hiking shoulder with good carry over    Shoulder Instructions       General Comments      Pertinent Vitals/ Pain       Pain Assessment Pain Assessment: Faces Faces Pain Scale: Hurts a little bit Pain Location: R shoulder Pain Descriptors / Indicators: Discomfort, Grimacing, Tightness Pain Intervention(s): Limited activity within patient's tolerance  Home Living                                          Prior Functioning/Environment              Frequency  Min 2X/week        Progress Toward Goals  OT Goals(current goals can now be found in the care plan section)  Progress towards OT goals: Progressing toward goals  Acute Rehab OT Goals Patient Stated Goal: to be independent again OT Goal Formulation: With patient Time For Goal Achievement: 01/01/22 Potential to Achieve Goals: Good ADL Goals Pt Will Perform Grooming: with min guard assist;standing Pt Will Perform Upper Body Bathing: with min guard assist;sitting Pt Will Transfer to Toilet: with min assist;ambulating;grab bars (met 12/20) Pt/caregiver will Perform Home Exercise Program: Increased  ROM;Increased strength;Right Upper extremity;With minimal assist;With written HEP provided Additional ADL Goal #1: pt will complete basic transfer +2 min (A) as precursor to adls  Plan Discharge plan remains appropriate    Co-evaluation                 AM-PAC OT "6 Clicks" Daily Activity     Outcome Measure   Help from another person eating meals?: None Help from another person taking care of personal grooming?: A Little Help from another person toileting, which includes using toliet, bedpan, or urinal?: A Little Help from another person bathing (including washing, rinsing, drying)?: A Little Help from another person to put on and taking off regular upper body clothing?: A Little Help from another person to put on and taking off regular lower body clothing?: A Little 6 Click Score: 19    End of Session Equipment Utilized During Treatment: Gait belt  OT Visit Diagnosis: Unsteadiness on feet (R26.81);Muscle weakness (generalized) (M62.81);Hemiplegia and hemiparesis;Low vision, both eyes (H54.2);Other symptoms and signs involving cognitive function;Other symptoms and signs involving the  nervous system (R29.898) Hemiplegia - Right/Left: Right Hemiplegia - dominant/non-dominant: Dominant Hemiplegia - caused by: Other Nontraumatic intracranial hemorrhage   Activity Tolerance Patient tolerated treatment well   Patient Left in chair;with call bell/phone within reach;with chair alarm set   Nurse Communication Mobility status        Time: 6948-5462 OT Time Calculation (min): 51 min  Charges: OT General Charges $OT Visit: 1 Visit OT Treatments $Self Care/Home Management : 23-37 mins $Neuromuscular Re-education: 8-22 mins  Maurie Boettcher, OT/L   Acute OT Clinical Specialist Acute Rehabilitation Services Pager [phone] Office [phone]   [facility] 12/27/2021, 11:01 AM

## 2021-12-27 NOTE — Progress Notes (Signed)
PROGRESS NOTE  Brent Warren  FIE:[id number] DOB: 06-07-1960 DOA: 11/18/2021 PCP: Marcine Matar, MD   Brief Narrative: 61 year old male with history of HTN, tobacco use, chronic alcohol use presented with sudden onset slurred speech and right-sided weakness.  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  He was admitted to the neuro ICU, remained stable and transferred to the hospitalist service on 11/13.  Patient also developed alcohol withdrawal during this hospitalization.  He had prolonged hospital course, waiting for placement.  TOC following.  Anticipate discharge to skilled nursing facility in next 1 to 2 days.  Assessment & Plan:  Principal Problem:   ICH (intracerebral hemorrhage) (HCC) Active Problems:   Hyponatremia   HTN (hypertension)   Pulmonary emphysema (HCC)   Tobacco abuse   ETOH abuse   Alcohol withdrawal delirium (HCC)   Elevated MCV   Protein-calorie malnutrition, severe   Left basal ganglia ICH Neurology was following and signed off on 11/24/2021 and recommended outpatient follow-up with neurology Echo showed EF of 60 to 65%.  LDL 62, A1c is 5.1 PT/OT recommending CIR.  As per CIR, patient is not a good candidate for CIR.   Patient to go to skilled nursing facility.   Oropharyngeal dysphagia Initially was kept n.p.o. and started on nasogastric tube feedings.   Seen by speech therapy and diet was advanced.   Has had good caloric intake.  NG tube was removed on 12/3. Tolerating regular diet.  Continue to encourage oral intake.   Essential hypertension  Continue just metoprolol for now.     PSVT Telemetry showed brief episodes of supraventricular tachycardia on 11/23. Echocardiogram shows normal systolic function.   TSH noted to be 5.0 with a normal free T4 of 0.77. Patient was started on beta-blocker.  Telemetry showed improvement.  Subsequently taken off of telemetry.   Agitation Seems to be stable on Seroquel.  Has been off of  restraints for several days now.   Alcohol abuse, alcohol withdrawal with delirium tremens Patient was noted to have significant alcohol withdrawal symptoms.  PCCM consulted and have signed off.  Completed phenobarbital taper.  Vitamin B1 normal Continue thiamine, folic acid and multivitamin   Hypokalemia/hyponatremia/hypomagnesemia Monitored electrolytes periodically.  On magnesium oxide.    Possible aspiration pneumonia/Possible UTI UA suggestive of UTI as well.  Cultures negative. Completed 5 days of Unasyn.   Respiratory status noted to be stable.     Vitamin B12 deficiency B12 177.  Folate 12.7.   Continue cyanocobalamine.     Macrocytic anemia Secondary to alcohol abuse as well as B12 deficiency. Ferritin noted to be 1074.  Iron 95.  TIBC 252.   Elevated LFTs AST and ALT are noted to be minimally elevated.  Possibly from alcohol use.  No recent imaging study of the hepatobiliary system noted in EMR.   Since levels are reasonably stable this can be pursued in the outpatient setting. Hepatitis panel is unremarkable.   Tobacco use, history of emphysema Continue nebs as needed Respiratory status is stable.     Nutrition Problem: Severe Malnutrition Etiology: social / environmental circumstances    DVT prophylaxis:heparin injection 5,000 Units Start: 11/19/21 1400 SCD's Start: 11/18/21 1137     Code Status: Full Code  Family Communication: None at the bedside  Patient status: Inpatient  Patient is from : Home  Anticipated discharge to: SNF  Estimated DC date: Whenever possible   Consultants: PCCM, neurology    Antimicrobials:  Anti-infectives (From admission, onward)  Start     Dose/Rate Route Frequency Ordered Stop   11/24/21 1530  Ampicillin-Sulbactam (UNASYN) 3 g in sodium chloride 0.9 % 100 mL IVPB        3 g 200 mL/hr over 30 Minutes Intravenous Every 6 hours 11/24/21 1437 11/28/21 2110       Subjective: Patient seen and examined at  bedside today.  Hemodynamically stable.  Appears comfortable.  Sitting in chair, remains alert and oriented. Medically stable for discharge whenever possible.  Discharge summary and orders have already been placed.  No change in the medical management.  TOC following for SNF placement.TOC says he has a bed at SNF likely tomorrow  Objective: Vitals:   12/26/21 1545 12/26/21 2035 12/27/21 0402 12/27/21 0831  BP: 115/78 114/79 116/74 128/83  Pulse: 87 85 81 98  Resp: 18     Temp: 98.1 F (36.7 C) 98 F (36.7 C) 97.7 F (36.5 C) 98 F (36.7 C)  TempSrc: Oral Oral Oral Oral  SpO2: 95% 94% 96% 97%  Weight:      Height:        Intake/Output Summary (Last 24 hours) at 12/27/2021 1321 Last data filed at 12/26/2021 2037 Gross per 24 hour  Intake --  Output 900 ml  Net -900 ml   Filed Weights   12/22/21 0500 12/23/21 0500 12/24/21 0500  Weight: 54.9 kg 52.5 kg 54.9 kg    Examination:  General exam: Overall comfortable, not in distress HEENT: PERRL Respiratory system:  no wheezes or crackles  Cardiovascular system: S1 & S2 heard, RRR.  Gastrointestinal system: Abdomen is nondistended, soft and nontender. Central nervous system: Alert and oriented Extremities: No edema, no clubbing ,no cyanosis Skin: No rashes, no ulcers,no icterus     Data Reviewed: I have personally reviewed following labs and imaging studies  CBC: No results for input(s): "WBC", "NEUTROABS", "HGB", "HCT", "MCV", "PLT" in the last 168 hours. Basic Metabolic Panel: No results for input(s): "NA", "K", "CL", "CO2", "GLUCOSE", "BUN", "CREATININE", "CALCIUM", "MG", "PHOS" in the last 168 hours.   No results found for this or any previous visit (from the past 240 hour(s)).   Radiology Studies: No results found.  Scheduled Meds:  ascorbic acid  500 mg Oral Daily   vitamin B-12  1,000 mcg Oral Daily   feeding supplement  237 mL Oral TID BM   folic acid  1 mg Oral Daily   heparin injection (subcutaneous)   5,000 Units Subcutaneous Q8H   hydrocortisone cream   Topical BID   magnesium oxide  400 mg Oral Daily   metoprolol tartrate  50 mg Oral BID   multivitamin with minerals  1 tablet Oral Daily   pantoprazole  40 mg Oral Daily   polyethylene glycol  17 g Oral Daily   QUEtiapine  50 mg Oral BID   senna-docusate  2 tablet Oral BID   sodium chloride flush  3 mL Intravenous Once   thiamine  100 mg Oral Daily   Continuous Infusions:   LOS: 39 days   Burnadette Pop, MD [facility] P12/20/2023, 1:21 PM

## 2021-12-27 NOTE — Progress Notes (Signed)
CSW spoke with Eunice Blase at [facility] who states the patient can be accepted into the facility tomorrow.  Edwin Dada, MSW, LCSW Transitions of Care  Clinical Social Worker II [phone]

## 2021-12-28 DIAGNOSIS — J439 Emphysema, unspecified: Secondary | ICD-10-CM | POA: Diagnosis not present

## 2021-12-28 DIAGNOSIS — I69051 Hemiplegia and hemiparesis following nontraumatic subarachnoid hemorrhage affecting right dominant side: Secondary | ICD-10-CM | POA: Diagnosis not present

## 2021-12-28 DIAGNOSIS — M6281 Muscle weakness (generalized): Secondary | ICD-10-CM | POA: Diagnosis not present

## 2021-12-28 DIAGNOSIS — E46 Unspecified protein-calorie malnutrition: Secondary | ICD-10-CM | POA: Diagnosis not present

## 2021-12-28 DIAGNOSIS — F1010 Alcohol abuse, uncomplicated: Secondary | ICD-10-CM | POA: Diagnosis not present

## 2021-12-28 DIAGNOSIS — F10231 Alcohol dependence with withdrawal delirium: Secondary | ICD-10-CM | POA: Diagnosis not present

## 2021-12-28 DIAGNOSIS — I610 Nontraumatic intracerebral hemorrhage in hemisphere, subcortical: Secondary | ICD-10-CM | POA: Diagnosis not present

## 2021-12-28 DIAGNOSIS — I10 Essential (primary) hypertension: Secondary | ICD-10-CM | POA: Diagnosis not present

## 2021-12-28 DIAGNOSIS — R269 Unspecified abnormalities of gait and mobility: Secondary | ICD-10-CM | POA: Diagnosis not present

## 2021-12-28 DIAGNOSIS — Z741 Need for assistance with personal care: Secondary | ICD-10-CM | POA: Diagnosis not present

## 2021-12-28 DIAGNOSIS — I6389 Other cerebral infarction: Secondary | ICD-10-CM | POA: Diagnosis not present

## 2021-12-28 DIAGNOSIS — E871 Hypo-osmolality and hyponatremia: Secondary | ICD-10-CM | POA: Diagnosis not present

## 2021-12-28 DIAGNOSIS — Z720 Tobacco use: Secondary | ICD-10-CM | POA: Diagnosis not present

## 2021-12-28 DIAGNOSIS — R293 Abnormal posture: Secondary | ICD-10-CM | POA: Diagnosis not present

## 2021-12-28 NOTE — Progress Notes (Signed)
Speech Language Pathology Treatment: Cognitive-Linquistic  Patient Details Name: Brent Warren MRN: [id number] DOB: [birth year] Today's Date: 12/28/2021 Time: 4098-1191 SLP Time Calculation (min) (ACUTE ONLY): 20 min  Assessment / Plan / Recommendation Clinical Impression  Patient seen by SLP for skilled treatment focused on cognitive and speech goals. Patient was sitting up in recliner and telling SLP that he is discharging to a SNF at 2pm today. His speech fluctuated a little between 85-95% and suspect that he is not far from baseline. He himself reported that "Ive always had some mumbling". Patient demonstrated good awareness to deficits, showing SLP how he could use his right hand better and how he could walk with walker in room. He did forget to use walker to walk the short distance back to his recliner when he went to answer phone that was ringing. Patient demonstrated good recall of recent medical and therapeutic interventions and he is aware of and looking forward to getting more therapy at SNF. He told SLP that after "two to three weeks" he was planning to move back in to his camper (living by himself). SLP suspects that patient's cognition is at or near baseline but he would benefit from evaluation and treatment as needed upon discharge to SNF.   HPI HPI: 61 year old male with history of HTN, tobacco use, EtOH use who comes into the hospital with sudden onset slurred speech and right-sided weakness.  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   He started to develop increased withdrawal symptoms on 11/13      SLP Plan  Continue with current plan of care      Recommendations for follow up therapy are one component of a multi-disciplinary discharge planning process, led by the attending physician.  Recommendations may be updated based on patient status, additional functional criteria and insurance authorization.    Recommendations                   Follow Up  Recommendations: Skilled nursing-short term rehab (<3 hours/day) Assistance recommended at discharge: Intermittent Supervision/Assistance SLP Visit Diagnosis: Cognitive communication deficit (Y78.295) Plan: Continue with current plan of care          Angela Nevin, MA, CCC-SLP Speech Therapy

## 2021-12-28 NOTE — TOC Transition Note (Signed)
Transition of Care [facility]) - CM/SW Discharge Note   Patient Details  Name: Brent Warren MRN: [id number] Date of Birth: 28-Jul-1960  [facility]) CM/SW Contact:  Janae Bridgeman, RN Phone Number: 12/28/2021, 10:28 AM   Clinical Narrative:    CM called and spoke with Eunice Blase, CM at [facility] and the facility has a bed available for patient today.  MD was notified and the patient was update.  [facility] WC transportation was arranged for 2 pm today.  Bedside nursing to provide a WC for patient's transport to the facility and [facility] transport to return the WC.  Bedside nursing - please call nursing report to [facility] at [phone]. [facility] will call [address] secretary's desk to pick the patient up by [facility] from the [place] tower entrance.  Discharge packet will be placed at secretary's desk to be transported with the patient to the facility.   Final next level of care: Skilled Nursing Facility Barriers to Discharge: No Barriers Identified   Patient Goals and CMS Choice Patient states their goals for this hospitalization and ongoing recovery are:: Agreeable to SNF rehab CMS Medicare.gov Compare Post Acute Care list provided to:: Patient Choice offered to / list presented to : Patient    Discharge Placement                       Discharge Plan and Services   Discharge Planning Services: CM Consult Post Acute Care Choice: Skilled Nursing Facility                               Social Determinants of Health (SDOH) Interventions     Readmission Risk Interventions    12/26/2021    3:37 PM  Readmission Risk Prevention Plan  Transportation Screening Complete  PCP or Specialist Appt within 5-7 Days Complete  Home Care Screening Complete  Medication Review (RN CM) Complete

## 2021-12-28 NOTE — Discharge Summary (Signed)
[facility]  Physician Discharge Summary   Patient ID: Brent Warren MRN: [id number] DOB/AGE: 1960/09/14 61 y.o.  Admit date: 11/18/2021 Discharge date:   12/28/2021  PCP: Marcine Matar, MD  DISCHARGE DIAGNOSES:  Principal Problem:   ICH (intracerebral hemorrhage) (HCC) Active Problems:   Hyponatremia   HTN (hypertension)   Pulmonary emphysema (HCC)   Tobacco abuse   ETOH abuse   Alcohol withdrawal delirium (HCC)   Elevated MCV   Protein-calorie malnutrition, severe   RECOMMENDATIONS FOR OUTPATIENT FOLLOW UP: Patient needs outpatient follow-up with neurology CBC and complete metabolic panel and magnesium level every week as diet progresses  Home Health: Going to SNF Equipment/Devices: None  CODE STATUS: Full code  DISCHARGE CONDITION: fair  Diet recommendation: Heart healthy  INITIAL HISTORY:  61 year old male with history of HTN, tobacco use, chronic alcohol use presented with sudden onset slurred speech and right-sided weakness.  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  He was admitted to the neuro ICU, remained stable and transferred to the hospitalist service on 11/13.  Patient also developed alcohol withdrawal during this hospitalization.  He had prolonged hospital course, waiting for placement.  TOC following.  Anticipate discharge to skilled nursing facility as bed available on 12/28/21.  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  LDL 62, A1c is 5.1PT/OT recommending CIR.  As per CIR, patient is not a good candidate for CIR.  Patient to go to skilled nursing facility.   Oropharyngeal dysphagia: Currently resolved tolerating diet.  NGT removed 12/3.diet     Essential hypertension: Well-controlled continue metoprolol PSVT:brief episodes of supraventricular tachycardia on 11/23.  Echo with normal systolic function Free T40.7 TSH 5, doing well on metoprolol  Alcohol abuse Alcohol withdrawal with delirium tremens and agitation: Had significant alcohol withdrawal symptoms seen by PCCM, completed phenobarbital taper, doing well at this time continue thiamine folate B12 multivitamins and vitamin C, continue to encourage alcohol cessation.   Hypokalemia/hyponatremia/hypomagnesemia: Resolved   Possible aspiration pneumonia/Possible UTI:UA suggestive of UTI as well.  Cultures negative.Completed 5 days of Unasyn.  Currently afebrile and hemodynamically stable   Vitamin B12 deficiency: B12 177.  Folate 12.7. Continue cyanocobalamine.   Macrocytic anemia: Due to alcohol abuse and B12 deficiency continue B12, alcohol cessation.   Elevated LFTs:AST and ALT are noted to be minimally elevated.  Possibly from alcohol use.  No recent imaging study of the hepatobiliary system noted in EMR.Since levels are reasonably stable this can be pursued in the outpatient setting.Hepatitis panel is unremarkable.   Tobacco use, history of emphysema:Continue nebs as needed Respiratory status is stable.   Nutrition Problem: Severe Malnutrition Etiology: social / environmental circumstances Signs/Symptoms: severe fat depletion, severe muscle depletion Interventions: Tube feeding, MVI, Refer to RD note for recommendations     Patient is stable.  Okay for discharge to SNF.  Subjective: Patient is alert awake oriented resting comfortably has mild right upper extremity residual weakness overall improving.  He feels he is ready rehab today.  Exam General exam: alert awake, older than stated age HEENT:Oral mucosa moist, Ear/Nose WNL grossly Respiratory system: Bilaterally clear BS, no use of accessory muscle Cardiovascular system: S1 & S2 +, No JVD. Gastrointestinal system:  Abdomen soft,NT,ND, BS+ Nervous  System: Alert, awake, moving extremities, w/ mild right upper extremity weakness able to lift Without Full-Strength, follows commands. Extremities: LE edema neg,distal peripheral pulses palpable.  Skin: No rashes,no icterus. MSK: Normal muscle bulk,tone, power   PERTINENT LABS: The results of significant diagnostics from this hospitalization (including imaging, microbiology, ancillary and laboratory) are listed below for reference.   Labs: Basic Metabolic Panel: No results for input(s): "NA", "K", "CL", "CO2", "GLUCOSE", "BUN", "CREATININE", "CALCIUM", "MG", "PHOS" in the last 168 hours.  Liver Function Tests: No results for input(s): "AST", "ALT", "ALKPHOS", "BILITOT", "PROT", "ALBUMIN" in the last 168 hours.   CBC: No results for input(s): "WBC", "NEUTROABS", "HGB", "HCT", "MCV", "PLT" in the last 168 hours.    CBG: No results for input(s): "GLUCAP" in the last 168 hours.  IMAGING STUDIES DG Swallowing Func-Speech Pathology  Result Date: 11/28/2021 Table formatting from the original result was not included. Images from the original result were not included. 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  ETOH abuse 11/06/2016  Hepatitis 05/22/2015  HTN (hypertension)   Immunization due 11/06/2016  Lung nodule, multiple 11/08/2015  Malnutrition of moderate degree 06/02/2015  Pneumonia 05/2015  Pulmonary emphysema (HCC) 11/06/2016  Tobacco abuse   Urge incontinence 11/06/2016 Past Surgical History: Past Surgical  History: Procedure Laterality Date  arm surgery    DECORTICATION Right 06/03/2015  Procedure: DECORTICATION;  Surgeon: [name], MD;  Location: [facility];  Service: Thoracic;  Laterality: Right;  VIDEO ASSISTED THORACOSCOPY (VATS)/EMPYEMA Right 06/03/2015  Procedure: VIDEO ASSISTED THORACOSCOPY (VATS)/EMPYEMA;  Surgeon: [name], MD;  Location: [facility] OR;  Service: Thoracic;  Laterality: Right; HPI: 61 year old male with history of HTN, tobacco use, EtOH use who comes into the hospital with sudden onset slurred speech and right-sided weakness.  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   He started to develop increased withdrawal symptoms on 11/13  No data recorded  Recommendations for follow up therapy are one component of a multi-disciplinary discharge planning process, led by the attending physician.  Recommendations may be updated based on patient status, additional functional criteria and insurance authorization. Assessment / Plan / Recommendation   11/28/2021  12:30 PM Clinical Impressions Clinical Impression Pt demonstrates mild oral dysphagia with slow lingual mobility, but adequate to clear most of the bolus from the oral cavity. Pt does have decreased bolus cohesion, contributing to premature spillage and delayed swallowing present with all trials. No penetration or aspiration occurred. No residue, no significant weakness. 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 Thin liquids shold be toelrated if pt is sitting upright. Pills can be given whole in puree. Will f/u for tolerance.     11/28/2021  12:30 PM Treatment Recommendations Treatment Recommendations Therapy as outlined in treatment plan below     11/28/2021  12:30 PM Prognosis Prognosis for Safe Diet Advancement Good   11/28/2021  12:30 PM Diet Recommendations SLP Diet Recommendations Dysphagia 2 (Fine chop) solids;Thin liquid Liquid  Administration via Cup;Straw Medication Administration Whole meds  with puree Compensations Slow rate;Small sips/bites Postural Changes Remain semi-upright after after feeds/meals (Comment);Seated upright at 90 degrees     11/28/2021  12:30 PM Other Recommendations Oral Care Recommendations Oral care BID Follow Up Recommendations Acute inpatient rehab (3hours/day)   11/28/2021  12:30 PM Frequency and Duration  Speech Therapy Frequency (ACUTE ONLY) min 2x/week Treatment Duration 2 weeks     11/28/2021  12:30 PM Oral Phase Oral Phase Impaired Oral - Nectar Cup Decreased bolus cohesion Oral - Nectar Straw Decreased bolus cohesion Oral - Thin Straw Decreased bolus cohesion Oral - Mech Soft Decreased bolus cohesion Oral - Pill [facility]    11/28/2021  12:30 PM Pharyngeal Phase Pharyngeal Phase Impaired Pharyngeal- Nectar Cup Delayed swallow initiation-pyriform sinuses Pharyngeal- Nectar Straw Delayed swallow initiation-pyriform sinuses Pharyngeal- Thin Straw Delayed swallow initiation-pyriform sinuses Pharyngeal- Puree Delayed swallow initiation-vallecula Pharyngeal- Mechanical Soft Delayed swallow initiation-vallecula     No data to display    DeBlois, Riley Nearing 11/28/2021, 1:06 PM                      DISCHARGE EXAMINATION: Vitals:   12/27/21 1605 12/27/21 1950 12/28/21 0403 12/28/21 0748  BP: 116/75 129/87 127/75 121/79  Pulse: 89 72 74 82  Resp:    15  Temp: 98.6 F (37 C) 98.6 F (37 C) 97.7 F (36.5 C) 97.8 F (36.6 C)  TempSrc: Oral Oral Oral Oral  SpO2: 99% 100% 97% 96%  Weight:      Height:       Awake alert.  In no distress. Lungs are clear to auscultation bilaterally S1-S2 is normal regular. Abdomen is soft.  Nontender nondistended.  DISPOSITION: SNF  Discharge Instructions     Ambulatory referral to Neurology   Complete by: As directed    Follow up with stroke clinic NP (Jessica Vanschaick or Darrol Angel, if both not available, consider Manson Allan, or [facility]) at [facility] in about  4 weeks. Thanks.   Call MD for:  difficulty breathing, headache or visual disturbances   Complete by: As directed    Call MD for:  extreme fatigue   Complete by: As directed    Call MD for:  persistant dizziness or light-headedness   Complete by: As directed    Call MD for:  persistant nausea and vomiting   Complete by: As directed    Call MD for:  severe uncontrolled pain   Complete by: As directed    Call MD for:  temperature >100.4   Complete by: As directed    Diet - low sodium heart healthy   Complete by: As directed    Discharge instructions   Complete by: As directed    Please review instructions on the discharge summary.  You were cared for by a hospitalist during your hospital stay. If you have any questions about your discharge medications or the care you received while you were in the hospital after you are discharged, you can call the unit and asked to speak with the hospitalist on call if the hospitalist that took care of you is not available. Once you are discharged, your primary care physician will handle any further medical issues. Please note that NO REFILLS for any discharge medications will be authorized once you are discharged, as it is imperative that you return to your primary care physician (or establish a relationship with a primary care physician if you do not have one) for your aftercare needs so that they can reassess your need  for medications and monitor your lab values. If you do not have a primary care physician, you can call [phone] for a physician referral.   Increase activity slowly   Complete by: As directed    No wound care   Complete by: As directed           Allergies as of 12/28/2021   No Known Allergies      Medication List     STOP taking these medications    amLODipine 10 MG tablet Commonly known as: NORVASC   loperamide 2 MG tablet Commonly known as: Imodium A-D   meloxicam 15 MG tablet Commonly known as: MOBIC       TAKE  these medications    acetaminophen 325 MG tablet Commonly known as: TYLENOL Take 2 tablets (650 mg total) by mouth every 4 (four) hours as needed for mild pain (or temp > 37.5 C (99.5 F)).   albuterol (2.5 MG/3ML) 0.083% nebulizer solution Commonly known as: PROVENTIL Take 3 mLs (2.5 mg total) by nebulization every 6 (six) hours as needed for wheezing or shortness of breath.   Ventolin HFA 108 (90 Base) MCG/ACT inhaler Generic drug: albuterol Inhale 2 puffs into the lungs every 6 (six) hours as needed for wheezing or shortness of breath.   cyanocobalamin 1000 MCG tablet Take 1 tablet (1,000 mcg total) by mouth daily.   feeding supplement Liqd Take 237 mLs by mouth 3 (three) times daily between meals.   folic acid 1 MG tablet Commonly known as: FOLVITE Take 1 tablet (1 mg total) by mouth daily.   magnesium oxide 400 (240 Mg) MG tablet Commonly known as: MAG-OX Take 1 tablet (400 mg total) by mouth daily.   metoprolol tartrate 50 MG tablet Commonly known as: LOPRESSOR Take 1 tablet (50 mg total) by mouth 2 (two) times daily.   multivitamin with minerals Tabs tablet Take 1 tablet by mouth daily.   pantoprazole 40 MG tablet Commonly known as: PROTONIX Take 1 tablet (40 mg total) by mouth daily.   polyethylene glycol 17 g packet Commonly known as: MIRALAX / GLYCOLAX Take 17 g by mouth daily.   QUEtiapine 50 MG tablet Commonly known as: SEROQUEL Take 1 tablet (50 mg total) by mouth 2 (two) times daily.   senna-docusate 8.6-50 MG tablet Commonly known as: Senokot-S Take 2 tablets by mouth 2 (two) times daily.   Symbicort 80-4.5 MCG/ACT inhaler Generic drug: budesonide-formoterol Inhale 2 puffs into the lungs 2 (two) times daily   thiamine 100 MG tablet Commonly known as: Vitamin B-1 Take 1 tablet (100 mg total) by mouth daily.          Contact information for follow-up providers     [facility]. Schedule an appointment as soon as possible  for a visit in 1 month(s).   Specialty: Neurology Why: stroke clinic Contact information: [address] [place] [id number] [phone]        [name], [name], MD. Schedule an appointment as soon as possible for a visit.   Specialty: Internal Medicine Why: post hospitalization follow up Contact information: [address] [place] [address] [phone]              Contact information for after-discharge care     Destination     [facility] Preferred SNF .   Service: Skilled Paramedicursing Contact information: [address] [place] [id number] [phone]  TOTAL DISCHARGE TIME: 35 minutes  Lanae Boast MD no a lot of discharges placement  With patient about the  [facility] Pager on [email or web address]  12/28/2021, 8:57 AM

## 2021-12-28 NOTE — Progress Notes (Signed)
Mobility Specialist: Progress Note   12/28/21 1106  Mobility  Activity Ambulated with assistance in hallway  Level of Assistance Standby assist, set-up cues, supervision of patient - no hands on  Assistive Device None  Distance Ambulated (ft) 250 ft  Activity Response Tolerated well  Mobility Referral Yes  $Mobility charge 1 Mobility   Received pt in chair having no complaints and agreeable to mobility. Pt was asymptomatic throughout ambulation and returned to room w/o fault. Left in chair w/ call bell in reach and all needs met.  [place] Perkey Mobility Specialist Please contact via SecureChat or Rehab office at [phone]

## 2021-12-29 DIAGNOSIS — E46 Unspecified protein-calorie malnutrition: Secondary | ICD-10-CM | POA: Diagnosis not present

## 2021-12-29 DIAGNOSIS — R1312 Dysphagia, oropharyngeal phase: Secondary | ICD-10-CM | POA: Diagnosis not present

## 2021-12-29 DIAGNOSIS — E876 Hypokalemia: Secondary | ICD-10-CM | POA: Diagnosis not present

## 2021-12-29 DIAGNOSIS — I629 Nontraumatic intracranial hemorrhage, unspecified: Secondary | ICD-10-CM | POA: Diagnosis not present

## 2021-12-29 DIAGNOSIS — M6281 Muscle weakness (generalized): Secondary | ICD-10-CM | POA: Diagnosis not present

## 2021-12-29 DIAGNOSIS — E871 Hypo-osmolality and hyponatremia: Secondary | ICD-10-CM | POA: Diagnosis not present

## 2021-12-29 DIAGNOSIS — E878 Other disorders of electrolyte and fluid balance, not elsewhere classified: Secondary | ICD-10-CM | POA: Diagnosis not present

## 2022-01-02 ENCOUNTER — Telehealth: Payer: Self-pay | Admitting: *Deleted

## 2022-01-02 NOTE — Patient Outreach (Signed)
  Care Coordination [facility] Note Transition Care Management Unsuccessful Follow-up Telephone Call  Date of discharge and from where:  12/28/21 from [facility]  Attempts:  1st Attempt  Reason for unsuccessful TCM follow-up call:  No answer/busy   Estanislado Emms RN, BSN [place]  [facility] RN Care Coordinator

## 2022-01-05 DIAGNOSIS — I629 Nontraumatic intracranial hemorrhage, unspecified: Secondary | ICD-10-CM | POA: Diagnosis not present

## 2022-01-05 DIAGNOSIS — E871 Hypo-osmolality and hyponatremia: Secondary | ICD-10-CM | POA: Diagnosis not present

## 2022-01-05 DIAGNOSIS — E878 Other disorders of electrolyte and fluid balance, not elsewhere classified: Secondary | ICD-10-CM | POA: Diagnosis not present

## 2022-01-05 DIAGNOSIS — R1312 Dysphagia, oropharyngeal phase: Secondary | ICD-10-CM | POA: Diagnosis not present

## 2022-01-05 DIAGNOSIS — M6281 Muscle weakness (generalized): Secondary | ICD-10-CM | POA: Diagnosis not present

## 2022-01-05 DIAGNOSIS — E46 Unspecified protein-calorie malnutrition: Secondary | ICD-10-CM | POA: Diagnosis not present

## 2022-01-05 DIAGNOSIS — E876 Hypokalemia: Secondary | ICD-10-CM | POA: Diagnosis not present

## 2022-01-08 DIAGNOSIS — J690 Pneumonitis due to inhalation of food and vomit: Secondary | ICD-10-CM | POA: Diagnosis not present

## 2022-01-08 DIAGNOSIS — I10 Essential (primary) hypertension: Secondary | ICD-10-CM | POA: Diagnosis not present

## 2022-01-08 DIAGNOSIS — I629 Nontraumatic intracranial hemorrhage, unspecified: Secondary | ICD-10-CM | POA: Diagnosis not present

## 2022-01-08 DIAGNOSIS — E46 Unspecified protein-calorie malnutrition: Secondary | ICD-10-CM | POA: Diagnosis not present

## 2022-01-08 DIAGNOSIS — R1312 Dysphagia, oropharyngeal phase: Secondary | ICD-10-CM | POA: Diagnosis not present

## 2022-01-08 DIAGNOSIS — F10931 Alcohol use, unspecified with withdrawal delirium: Secondary | ICD-10-CM | POA: Diagnosis not present

## 2022-01-08 DIAGNOSIS — I471 Supraventricular tachycardia, unspecified: Secondary | ICD-10-CM | POA: Diagnosis not present

## 2022-01-08 DIAGNOSIS — E538 Deficiency of other specified B group vitamins: Secondary | ICD-10-CM | POA: Diagnosis not present

## 2022-01-09 DIAGNOSIS — E538 Deficiency of other specified B group vitamins: Secondary | ICD-10-CM | POA: Diagnosis not present

## 2022-01-09 DIAGNOSIS — J690 Pneumonitis due to inhalation of food and vomit: Secondary | ICD-10-CM | POA: Diagnosis not present

## 2022-01-09 DIAGNOSIS — I629 Nontraumatic intracranial hemorrhage, unspecified: Secondary | ICD-10-CM | POA: Diagnosis not present

## 2022-01-09 DIAGNOSIS — F10931 Alcohol use, unspecified with withdrawal delirium: Secondary | ICD-10-CM | POA: Diagnosis not present

## 2022-01-09 DIAGNOSIS — I10 Essential (primary) hypertension: Secondary | ICD-10-CM | POA: Diagnosis not present

## 2022-01-09 DIAGNOSIS — I471 Supraventricular tachycardia, unspecified: Secondary | ICD-10-CM | POA: Diagnosis not present

## 2022-01-09 DIAGNOSIS — R1312 Dysphagia, oropharyngeal phase: Secondary | ICD-10-CM | POA: Diagnosis not present

## 2022-01-17 DIAGNOSIS — E46 Unspecified protein-calorie malnutrition: Secondary | ICD-10-CM | POA: Diagnosis not present

## 2022-01-22 DIAGNOSIS — E46 Unspecified protein-calorie malnutrition: Secondary | ICD-10-CM | POA: Diagnosis not present

## 2022-01-22 DIAGNOSIS — E876 Hypokalemia: Secondary | ICD-10-CM | POA: Diagnosis not present

## 2022-01-22 DIAGNOSIS — I471 Supraventricular tachycardia, unspecified: Secondary | ICD-10-CM | POA: Diagnosis not present

## 2022-01-22 DIAGNOSIS — I629 Nontraumatic intracranial hemorrhage, unspecified: Secondary | ICD-10-CM | POA: Diagnosis not present

## 2022-01-22 DIAGNOSIS — R1312 Dysphagia, oropharyngeal phase: Secondary | ICD-10-CM | POA: Diagnosis not present

## 2022-01-22 DIAGNOSIS — M6281 Muscle weakness (generalized): Secondary | ICD-10-CM | POA: Diagnosis not present

## 2022-01-27 DIAGNOSIS — E871 Hypo-osmolality and hyponatremia: Secondary | ICD-10-CM | POA: Diagnosis not present

## 2022-01-27 DIAGNOSIS — E876 Hypokalemia: Secondary | ICD-10-CM | POA: Diagnosis not present

## 2022-01-27 DIAGNOSIS — F10931 Alcohol use, unspecified with withdrawal delirium: Secondary | ICD-10-CM | POA: Diagnosis not present

## 2022-01-27 DIAGNOSIS — I629 Nontraumatic intracranial hemorrhage, unspecified: Secondary | ICD-10-CM | POA: Diagnosis not present

## 2022-01-30 ENCOUNTER — Telehealth: Payer: Self-pay | Admitting: Adult Health

## 2022-01-30 ENCOUNTER — Ambulatory Visit (INDEPENDENT_AMBULATORY_CARE_PROVIDER_SITE_OTHER): Payer: Medicaid Other | Admitting: Adult Health

## 2022-01-30 ENCOUNTER — Encounter: Payer: Self-pay | Admitting: Adult Health

## 2022-01-30 VITALS — BP 119/74 | HR 87 | Ht 69.0 in | Wt 123.0 lb

## 2022-01-30 DIAGNOSIS — Z09 Encounter for follow-up examination after completed treatment for conditions other than malignant neoplasm: Secondary | ICD-10-CM | POA: Diagnosis not present

## 2022-01-30 DIAGNOSIS — E538 Deficiency of other specified B group vitamins: Secondary | ICD-10-CM

## 2022-01-30 DIAGNOSIS — M62838 Other muscle spasm: Secondary | ICD-10-CM | POA: Diagnosis not present

## 2022-01-30 DIAGNOSIS — I610 Nontraumatic intracerebral hemorrhage in hemisphere, subcortical: Secondary | ICD-10-CM | POA: Diagnosis not present

## 2022-01-30 DIAGNOSIS — G8191 Hemiplegia, unspecified affecting right dominant side: Secondary | ICD-10-CM

## 2022-01-30 DIAGNOSIS — R7989 Other specified abnormal findings of blood chemistry: Secondary | ICD-10-CM | POA: Diagnosis not present

## 2022-01-30 DIAGNOSIS — F1910 Other psychoactive substance abuse, uncomplicated: Secondary | ICD-10-CM

## 2022-01-30 MED ORDER — BACLOFEN 10 MG PO TABS: 10.0000 mg | ORAL_TABLET | Freq: Two times a day (BID) | ORAL | 5 refills | Status: DC

## 2022-01-30 NOTE — Patient Instructions (Addendum)
Recommend starting baclofen 10mg  twice daily for muscle pain  - please monitor for possible need of increased dosage after the next week if no benefit. Okay to use muscle ointment as needed for pain as currently prescribed   Continue working with therapies for hopeful ongoing recovery   Will repeat lab work today including liver function tests and B12 levels  Please ensure you refrain from alcohol use once you return home  Please work on completely quitting tobacco use as continued use greatly increases risk of additional strokes  Continue to follow up with PCP regarding blood pressure management  Maintain strict control of hypertension with blood pressure goal below 130/90  Signs of a Stroke? Follow the BEFAST method:  Balance Watch for a sudden loss of balance, trouble with coordination or vertigo Eyes Is there a sudden loss of vision in one or both eyes? Or double vision?  Face: Ask the person to smile. Does one side of the face droop or is it numb?  Arms: Ask the person to raise both arms. Does one arm drift downward? Is there weakness or numbness of a leg? Speech: Ask the person to repeat a simple phrase. Does the speech sound slurred/strange? Is the person confused ? Time: If you observe any of these signs, call 911.    Followup in the future with me in 4 months with Dr. Leonie Man or call earlier if needed     Thank you for coming to see [place] at [facility]. I hope we have been able to provide you high quality care today.  You may receive a patient satisfaction survey over the next few weeks. We would appreciate your feedback and comments so that we may continue to improve ourselves and the health of our patients.

## 2022-01-30 NOTE — Telephone Encounter (Signed)
Contacted brother back, straight to VM. VM full

## 2022-01-30 NOTE — Progress Notes (Signed)
[facility] [address] [place]. [place] [address] [phone]       HOSPITAL FOLLOW UP NOTE  [name] Date of Birth:  April 21, 1960 Medical Record Number:  [id number]   Reason for Referral:  hospital stroke follow up    SUBJECTIVE:   CHIEF COMPLAINT:  Chief Complaint  Patient presents with   New Patient (Initial Visit)    Patient in room #1 and alone. Patient doesn't why he's here today. Patient he has right shoulder pain.    HPI:   Brent Warren is a 62 y.o. male with history of hypertension, smoker, heavy alcoholic use who presented to ED on 11/18/2021 with right-sided numbness and weakness, right facial droop, slurred speech.  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  No antithrombotic recommended giving ICH.  UDS positive for THC.  Current tobacco use with cessation counseling provided.  Heavy alcohol use, 6-10 beers daily, hospital course complicated by alcohol withdrawal and delirium tremors, placed on phenobarbital taper, EEG moderate to severe diffuse encephalopathy.  Noted B12 deficiency at 177 and placed on supplementation.  Prolonged hospital course waiting for placement, eventually discharged to SNF on 12/21.   Today, 01/30/2022, patient is being seen for initial hospital follow-up unaccompanied.  He is a current residing at [facility] in [place].  He continues to receive therapies for residual right-sided weakness, reports improvement since discharge. Has been having right shoulder pain that has been interfering with daily activity, rehab and sleep. Is prescribed muscle cream PRN but was not aware of this.  Maintaining a regular diet without difficulty.  Ambulates without assistive device, did have a fall recently where he slipped on ice but thankfully no significant injury, denies hitting head. He was supposed to return home last week but is unable to go back living with  his brother but he is currently looking into different housing options prior to being discharged.   He continues to smoke about 4 cigarettes per day Denies any current EtOH use, does not plan on restarting use once discharged  Blood pressure well-controlled currently at 119/74 Remains on B12 supplement for B12 deficiency, denies any lab work since discharge     PERTINENT IMAGING  Per hospitalization 11/18/2021 -12/28/2021 CT showed left BG small ICH CTA head and neck unremarkable, no aneurysm or AVM MRI left BG small ICH CT repeat stable hematoma x 2 2D Echo EF 60 to 65% LDL 62 HgbA1c 5.1 UDS positive for THC B12 177   ROS:   14 system review of systems performed and negative with exception of those listed in HPI  PMH:  Past Medical History:  Diagnosis Date   Alcoholic (HCC)    Chest pain 04/02/2017   COPD (chronic obstructive pulmonary disease) (HCC)    Empyema of pleural space (HCC) 05/2015   ETOH abuse 11/06/2016   Hepatitis 05/22/2015   HTN (hypertension)    Immunization due 11/06/2016   Lung nodule, multiple 11/08/2015   Malnutrition of moderate degree 06/02/2015   Pneumonia 05/2015   Pulmonary emphysema (HCC) 11/06/2016   Tobacco abuse    Urge incontinence 11/06/2016    PSH:  Past Surgical History:  Procedure Laterality Date   arm surgery     DECORTICATION Right 06/03/2015   Procedure: DECORTICATION;  Surgeon: [name], MD;  Location: [facility] OR;  Service: Thoracic;  Laterality: Right;   VIDEO ASSISTED THORACOSCOPY (VATS)/EMPYEMA Right 06/03/2015   Procedure: VIDEO ASSISTED THORACOSCOPY (  VATS)/EMPYEMA;  Surgeon: [name], MD;  Location: [facility] OR;  Service: Thoracic;  Laterality: Right;    Social History:  Social History   Socioeconomic History   Marital status: Single    Spouse name: Not on file   Number of children: Not on file   Years of education: Not on file   Highest education level: Not on file  Occupational History   Not on  file  Tobacco Use   Smoking status: Every Day    Packs/day: 1.00    Years: 47.00    Total pack years: 47.00    Types: Cigarettes    Start date: 07/08/2015   Smokeless tobacco: Never   Tobacco comments:    a pack in a couple of days  Vaping Use   Vaping Use: Never used  Substance and Sexual Activity   Alcohol use: Yes    Alcohol/week: 42.0 standard drinks of alcohol    Types: 42 Cans of beer per week    Comment: daily   Drug use: No   Sexual activity: Not Currently  Other Topics Concern   Not on file  Social History Narrative   Not on file   Social Determinants of Health   Financial Resource Strain: Not on file  Food Insecurity: No Food Insecurity (11/18/2021)   Hunger Vital Sign    Worried About Running Out of Food in the Last Year: Never true    Ran Out of Food in the Last Year: Never true  Transportation Needs: No Transportation Needs (11/18/2021)   PRAPARE - Administrator, Civil Service (Medical): No    Lack of Transportation (Non-Medical): No  Physical Activity: Not on file  Stress: Not on file  Social Connections: Not on file  Intimate Partner Violence: Not At Risk (11/18/2021)   Humiliation, Afraid, Rape, and Kick questionnaire    Fear of Current or Ex-Partner: No    Emotionally Abused: No    Physically Abused: No    Sexually Abused: No    Family History:  Family History  Problem Relation Age of Onset   Heart attack Mother    Cirrhosis Father    Stroke Sister    Heart attack Brother     Medications:   Current Outpatient Medications on File Prior to Visit  Medication Sig Dispense Refill   acetaminophen (TYLENOL) 325 MG tablet Take 2 tablets (650 mg total) by mouth every 4 (four) hours as needed for mild pain (or temp > 37.5 C (99.5 F)).     albuterol (PROVENTIL) (2.5 MG/3ML) 0.083% nebulizer solution Take 3 mLs (2.5 mg total) by nebulization every 6 (six) hours as needed for wheezing or shortness of breath. 90 mL 2   albuterol (VENTOLIN  HFA) 108 (90 Base) MCG/ACT inhaler Inhale 2 puffs into the lungs every 6 (six) hours as needed for wheezing or shortness of breath. 18 g 0   budesonide-formoterol (SYMBICORT) 80-4.5 MCG/ACT inhaler Inhale 2 puffs into the lungs 2 (two) times daily 10.2 g 0   cyanocobalamin 1000 MCG tablet Take 1 tablet (1,000 mcg total) by mouth daily.     feeding supplement (ENSURE ENLIVE / ENSURE PLUS) LIQD Take 237 mLs by mouth 3 (three) times daily between meals. 237 mL 12   folic acid (FOLVITE) 1 MG tablet Take 1 tablet (1 mg total) by mouth daily.     magnesium oxide (MAG-OX) 400 (240 Mg) MG tablet Take 1 tablet (400 mg total) by mouth daily.  metoprolol tartrate (LOPRESSOR) 50 MG tablet Take 1 tablet (50 mg total) by mouth 2 (two) times daily.     Multiple Vitamin (MULTIVITAMIN WITH MINERALS) TABS tablet Take 1 tablet by mouth daily.     pantoprazole (PROTONIX) 40 MG tablet Take 1 tablet (40 mg total) by mouth daily.     polyethylene glycol (MIRALAX / GLYCOLAX) 17 g packet Take 17 g by mouth daily. 14 each 0   QUEtiapine (SEROQUEL) 50 MG tablet Take 1 tablet (50 mg total) by mouth 2 (two) times daily.     senna-docusate (SENOKOT-S) 8.6-50 MG tablet Take 2 tablets by mouth 2 (two) times daily.     thiamine (VITAMIN B-1) 100 MG tablet Take 1 tablet (100 mg total) by mouth daily.     No current facility-administered medications on file prior to visit.    Allergies:  No Known Allergies    OBJECTIVE:  Physical Exam  Vitals:   01/30/22 0945  BP: 119/74  Pulse: 87  Weight: 123 lb (55.8 kg)  Height: 5\' 9"  (1.753 m)   Body mass index is 18.16 kg/m. No results found.  General: well developed, well nourished, very pleasant middle-age Caucasian male, seated, in no evident distress Head: head normocephalic and atraumatic.   Neck: supple with no carotid or supraclavicular bruits Cardiovascular: regular rate and rhythm, no murmurs Musculoskeletal: no deformity Skin:  no rash/petichiae Vascular:   Normal pulses all extremities   Neurologic Exam Mental Status: Awake and fully alert.  Mild dysarthria.  No evidence of aphasia.  Oriented to place and time. Recent and remote memory intact. Attention span, concentration and fund of knowledge appropriate. Mood and affect appropriate.  Cranial Nerves: Fundoscopic exam reveals sharp disc margins. Pupils equal, briskly reactive to light. Extraocular movements full without nystagmus. Visual fields full to confrontation.  Mild HOH bilaterally.  Facial sensation intact.  Mild right lower facial weakness.  Tongue, palate moves normally and symmetrically.  Motor: Normal strength, bulk and tone left upper and lower extremity RUE: 4/5 proximal, 3/5 hand grip with increased tone greater in shoulder and hand, difficulty flexing and extending fingers, unable to make fist RLE: 4+/5 throughout  Sensory.: intact to touch , pinprick , position and vibratory sensation.  Coordination: Rapid alternating movements normal on left side. Finger-to-nose performed accurately LUE and heel-to-shin performed accurately bilaterally. Gait and Station: Arises from chair without difficulty. Stance is normal. Gait demonstrates LLE decreased stride length and step height with mild unsteadiness without use of AD.  Tandem walk and heel toe not attempted Reflexes: 1+ and symmetric. Toes downgoing.     NIHSS  3 Modified Rankin  3      ASSESSMENT: Zeki Bedrosian is a 62 y.o. year old male with left BG ICH on 11/18/2021 likely due to small vessel disease given multiple uncontrolled risk factors. Vascular risk factors include HTN, tobacco use, THC use, alcohol abuse and B12 deficiency.      PLAN:  Left BG ICH:  Residual deficit: Right hemiparesis with spasticity and mild dysarthria.  Continue working with therapies for hopeful ongoing recovery.  Recommend trialing baclofen 10 mg twice daily for spasticity, request SNF monitor medication and increase dosage as needed.  No  indication for antithrombotic given ICH or statin given LDL below goal and elevated LFT's likely in setting of EtOH use.  Repeat LFTs today Discussed secondary stroke prevention measures and importance of close PCP follow up for aggressive stroke risk factor management including BP goal<130/90, HLD with LDL goal<70 and DM  with A1c.<7 .  Stroke labs 11/2021: LDL 62, A1c 5.1 I have gone over the pathophysiology of stroke, warning signs and symptoms, risk factors and their management in some detail with instructions to go to the closest emergency room for symptoms of concern. Polysubstance abuse: continued tobacco use, discussed importance of complete cessation. Denies any current EtOH or THC use.  B12 deficiency: Repeat B12 level today.  Continue B12 supplement    Follow up in 4 months or call earlier if needed   CC:  [facility] provider: Dr. Leonie Man PCP: Ladell Pier, MD    I spent 59 minutes of face-to-face and non-face-to-face time with patient.  This included previsit chart review including review of recent hospitalization, lab review, study review, order entry, electronic health record documentation, patient education regarding recent stroke including etiology, secondary stroke prevention measures and importance of managing stroke risk factors, residual deficits and typical recovery time and answered all other questions to patient satisfaction   Frann Rider, AGNP-BC  [facility] [address] [place], [place] [address]  Phone [phone] Fax [phone] Note: This document was prepared with digital dictation and possible smart phrase technology. Any transcriptional errors that result from this process are unintentional.

## 2022-01-30 NOTE — Telephone Encounter (Signed)
Pt brother is calling. Asking if a nurse will give him a call to go over what happen at pt appointment today.

## 2022-01-31 LAB — COMPREHENSIVE METABOLIC PANEL
ALT: 18 IU/L (ref 0–44)
AST: 16 IU/L (ref 0–40)
Albumin/Globulin Ratio: 1.3 (ref 1.2–2.2)
Albumin: 4.4 g/dL (ref 3.9–4.9)
Alkaline Phosphatase: 122 IU/L — ABNORMAL HIGH (ref 44–121)
BUN/Creatinine Ratio: 12 (ref 10–24)
BUN: 10 mg/dL (ref 8–27)
Bilirubin Total: 0.4 mg/dL (ref 0.0–1.2)
CO2: 21 mmol/L (ref 20–29)
Calcium: 10.6 mg/dL — ABNORMAL HIGH (ref 8.6–10.2)
Chloride: 102 mmol/L (ref 96–106)
Creatinine, Ser: 0.83 mg/dL (ref 0.76–1.27)
Globulin, Total: 3.4 g/dL (ref 1.5–4.5)
Glucose: 91 mg/dL (ref 70–99)
Potassium: 5.1 mmol/L (ref 3.5–5.2)
Sodium: 142 mmol/L (ref 134–144)
Total Protein: 7.8 g/dL (ref 6.0–8.5)
eGFR: 99 mL/min/{1.73_m2} (ref >59)

## 2022-01-31 LAB — B12 AND FOLATE PANEL
Folate: >20 ng/mL (ref >3.0)
Vitamin B-12: 709 pg/mL (ref 232–1245)

## 2022-01-31 NOTE — Telephone Encounter (Signed)
Contacted brother again, no answer

## 2022-02-01 NOTE — Telephone Encounter (Signed)
Pt brother return call, went over plan from [place] and lab results with him. He will come to office to pick up copy of results and AVS. He was appreciative for the information.

## 2022-02-01 NOTE — Telephone Encounter (Signed)
Contacted pt regarding labs, no answer.

## 2022-02-08 DIAGNOSIS — I10 Essential (primary) hypertension: Secondary | ICD-10-CM | POA: Diagnosis not present

## 2022-03-01 DIAGNOSIS — I629 Nontraumatic intracranial hemorrhage, unspecified: Secondary | ICD-10-CM | POA: Diagnosis not present

## 2022-03-01 DIAGNOSIS — K219 Gastro-esophageal reflux disease without esophagitis: Secondary | ICD-10-CM | POA: Diagnosis not present

## 2022-03-01 DIAGNOSIS — M62838 Other muscle spasm: Secondary | ICD-10-CM | POA: Diagnosis not present

## 2022-03-01 DIAGNOSIS — E538 Deficiency of other specified B group vitamins: Secondary | ICD-10-CM | POA: Diagnosis not present

## 2022-03-01 DIAGNOSIS — J449 Chronic obstructive pulmonary disease, unspecified: Secondary | ICD-10-CM | POA: Diagnosis not present

## 2022-03-01 DIAGNOSIS — I471 Supraventricular tachycardia, unspecified: Secondary | ICD-10-CM | POA: Diagnosis not present

## 2022-03-01 DIAGNOSIS — I10 Essential (primary) hypertension: Secondary | ICD-10-CM | POA: Diagnosis not present

## 2022-03-09 DIAGNOSIS — Z419 Encounter for procedure for purposes other than remedying health state, unspecified: Secondary | ICD-10-CM | POA: Diagnosis not present

## 2022-03-15 DIAGNOSIS — J449 Chronic obstructive pulmonary disease, unspecified: Secondary | ICD-10-CM | POA: Diagnosis not present

## 2022-03-15 DIAGNOSIS — E538 Deficiency of other specified B group vitamins: Secondary | ICD-10-CM | POA: Diagnosis not present

## 2022-03-28 DIAGNOSIS — I10 Essential (primary) hypertension: Secondary | ICD-10-CM | POA: Diagnosis not present

## 2022-03-28 DIAGNOSIS — J439 Emphysema, unspecified: Secondary | ICD-10-CM | POA: Diagnosis not present

## 2022-03-28 DIAGNOSIS — R293 Abnormal posture: Secondary | ICD-10-CM | POA: Diagnosis not present

## 2022-03-28 DIAGNOSIS — Z741 Need for assistance with personal care: Secondary | ICD-10-CM | POA: Diagnosis not present

## 2022-03-28 DIAGNOSIS — E46 Unspecified protein-calorie malnutrition: Secondary | ICD-10-CM | POA: Diagnosis not present

## 2022-03-28 DIAGNOSIS — M6281 Muscle weakness (generalized): Secondary | ICD-10-CM | POA: Diagnosis not present

## 2022-03-28 DIAGNOSIS — I6389 Other cerebral infarction: Secondary | ICD-10-CM | POA: Diagnosis not present

## 2022-03-28 DIAGNOSIS — I69051 Hemiplegia and hemiparesis following nontraumatic subarachnoid hemorrhage affecting right dominant side: Secondary | ICD-10-CM | POA: Diagnosis not present

## 2022-03-28 DIAGNOSIS — R269 Unspecified abnormalities of gait and mobility: Secondary | ICD-10-CM | POA: Diagnosis not present

## 2022-03-28 DIAGNOSIS — E871 Hypo-osmolality and hyponatremia: Secondary | ICD-10-CM | POA: Diagnosis not present

## 2022-04-19 ENCOUNTER — Telehealth: Payer: Self-pay | Admitting: Internal Medicine

## 2022-04-19 NOTE — Telephone Encounter (Signed)
Outreach to patient to schedule an appointment to follow up with PCP for [facility] gaps. LVM. AS, CMA

## 2022-04-30 ENCOUNTER — Telehealth: Payer: Self-pay | Admitting: Neurology

## 2022-04-30 ENCOUNTER — Encounter: Payer: Self-pay | Admitting: Neurology

## 2022-04-30 NOTE — Telephone Encounter (Signed)
LVM and sent letter in mail informing pt of need to reschedule 06/06/22 appointment - MD out 

## 2022-05-28 ENCOUNTER — Telehealth: Payer: Self-pay | Admitting: Internal Medicine

## 2022-05-28 NOTE — Telephone Encounter (Signed)
Copied from CRM [id number]. Topic: General - Other >> May 28, 2022  3:26 PM Santiya F wrote: Reason for CRM: Pt's brother [name] is calling in because pt was at [facility] and pt's brother removed him on Saturday for a visit and pt didn't do a visitation form on the pt and the home is requesting an FL2 form in order for him to come back to the home. [name] was instructed to contact pt's primary care office for the form. [name] says Marta Lamas is the person to speak with if there are any concerns regarding this-[phone].

## 2022-05-29 ENCOUNTER — Encounter (HOSPITAL_COMMUNITY): Payer: Self-pay

## 2022-05-29 ENCOUNTER — Emergency Department (HOSPITAL_COMMUNITY)
Admission: EM | Admit: 2022-05-29 | Discharge: 2022-05-29 | Disposition: A | Discharge status: Home / Self Care | Payer: Medicaid Other | Attending: Emergency Medicine | Admitting: Emergency Medicine

## 2022-05-29 ENCOUNTER — Other Ambulatory Visit: Payer: Self-pay

## 2022-05-29 DIAGNOSIS — Z8673 Personal history of transient ischemic attack (TIA), and cerebral infarction without residual deficits: Secondary | ICD-10-CM | POA: Insufficient documentation

## 2022-05-29 DIAGNOSIS — Z599 Problem related to housing and economic circumstances, unspecified: Secondary | ICD-10-CM | POA: Diagnosis not present

## 2022-05-29 DIAGNOSIS — Z79899 Other long term (current) drug therapy: Secondary | ICD-10-CM | POA: Diagnosis not present

## 2022-05-29 DIAGNOSIS — I69351 Hemiplegia and hemiparesis following cerebral infarction affecting right dominant side: Secondary | ICD-10-CM | POA: Insufficient documentation

## 2022-05-29 DIAGNOSIS — Z5900 Homelessness unspecified: Secondary | ICD-10-CM | POA: Diagnosis present

## 2022-05-29 LAB — CBC WITH DIFFERENTIAL/PLATELET
Abs Immature Granulocytes: 0.03 10*3/uL (ref 0.00–0.07)
Basophils Absolute: 0.1 10*3/uL (ref 0.0–0.1)
Basophils Relative: 1 %
Eosinophils Absolute: 0.1 10*3/uL (ref 0.0–0.5)
Eosinophils Relative: 2 %
HCT: 35.7 % — ABNORMAL LOW (ref 39.0–52.0)
Hemoglobin: 12.2 g/dL — ABNORMAL LOW (ref 13.0–17.0)
Immature Granulocytes: 0 %
Lymphocytes Relative: 32 %
Lymphs Abs: 2.5 10*3/uL (ref 0.7–4.0)
MCH: 31.5 pg (ref 26.0–34.0)
MCHC: 34.2 g/dL (ref 30.0–36.0)
MCV: 92.2 fL (ref 80.0–100.0)
Monocytes Absolute: 0.6 10*3/uL (ref 0.1–1.0)
Monocytes Relative: 7 %
Neutro Abs: 4.5 10*3/uL (ref 1.7–7.7)
Neutrophils Relative %: 58 %
Platelets: 247 10*3/uL (ref 150–400)
RBC: 3.87 MIL/uL — ABNORMAL LOW (ref 4.22–5.81)
RDW: 13.4 % (ref 11.5–15.5)
WBC: 7.7 10*3/uL (ref 4.0–10.5)
nRBC: 0 % (ref 0.0–0.2)

## 2022-05-29 LAB — COMPREHENSIVE METABOLIC PANEL
ALT: 19 U/L (ref 0–44)
AST: 24 U/L (ref 15–41)
Albumin: 4 g/dL (ref 3.5–5.0)
Alkaline Phosphatase: 80 U/L (ref 38–126)
Anion gap: 13 (ref 5–15)
BUN: 11 mg/dL (ref 8–23)
CO2: 20 mmol/L — ABNORMAL LOW (ref 22–32)
Calcium: 9.4 mg/dL (ref 8.9–10.3)
Chloride: 101 mmol/L (ref 98–111)
Creatinine, Ser: 0.92 mg/dL (ref 0.61–1.24)
GFR, Estimated: >60 mL/min (ref >60)
Glucose, Bld: 124 mg/dL — ABNORMAL HIGH (ref 70–99)
Potassium: 3.8 mmol/L (ref 3.5–5.1)
Sodium: 134 mmol/L — ABNORMAL LOW (ref 135–145)
Total Bilirubin: 0.8 mg/dL (ref 0.3–1.2)
Total Protein: 7.8 g/dL (ref 6.5–8.1)

## 2022-05-29 LAB — ETHANOL: Alcohol, Ethyl (B): <10 mg/dL (ref <10)

## 2022-05-29 NOTE — Progress Notes (Signed)
CSW contacted Marta Lamas in admissions at [facility]. CSW was told patients brother [name] discharged patient on May 18th so his brother can go live in his trailer. CSW was told that patient wanted to leave because he didn't want to give up his check. CSW was told by [facility] that patient can't just come back to [facility] and that he would have to start over. Patient will need to go through his PCP to get an FL2 filled out.    CSW spoke with patient and his brother [name] at bedside and explained due to him discharging patient from the facility he would have to start over. [name] stated he realizes that and stated that he was hoping the ED would do the FL2. CSW instructed [name] to go through patients PCP to get the paperwork filled out.

## 2022-05-29 NOTE — ED Triage Notes (Signed)
Pt arrives POV with c/o of needing replacement. A FL2 form is requested so he can return to a facility for care. PT states that he left too soon and unable to care for himself.

## 2022-05-29 NOTE — ED Provider Notes (Signed)
[facility] DE[facility]MENT AT [facility] Provider Note   CSN: [id number] Arrival date & time: 05/29/22  1253     History  Chief Complaint  Patient presents with   Homeless    Brent Warren is a 62 y.o. male status post CVA with residual right sided weakness who presents to ED with brother requesting placement to nursing facility. Reports was doing well at rehab when brother discharged him from facility [facility] but has had difficulty caring for him at home. Brother at bedside confirms he would like pt to go back to nursing facility and check him out for visits instead. Pt has no acute complaints. He is at baseline. No other concerns voiced today.       Home Medications Prior to Admission medications   Medication Sig Start Date End Date Taking? Authorizing Provider  acetaminophen (TYLENOL) 325 MG tablet Take 2 tablets (650 mg total) by mouth every 4 (four) hours as needed for mild pain (or temp > 37.5 C (99.5 F)). 12/18/21   Osvaldo Shipper, MD  albuterol (PROVENTIL) (2.5 MG/3ML) 0.083% nebulizer solution Take 3 mLs (2.5 mg total) by nebulization every 6 (six) hours as needed for wheezing or shortness of breath. 07/06/21   Marcine Matar, MD  albuterol (VENTOLIN HFA) 108 (90 Base) MCG/ACT inhaler Inhale 2 puffs into the lungs every 6 (six) hours as needed for wheezing or shortness of breath. 11/03/21   Marcine Matar, MD  baclofen (LIORESAL) 10 MG tablet Take 1 tablet (10 mg total) by mouth 2 (two) times daily. 01/30/22   Ihor Austin, NP  budesonide-formoterol (SYMBICORT) 80-4.5 MCG/ACT inhaler Inhale 2 puffs into the lungs 2 (two) times daily 11/03/21   Marcine Matar, MD  cyanocobalamin 1000 MCG tablet Take 1 tablet (1,000 mcg total) by mouth daily. 12/19/21   Osvaldo Shipper, MD  feeding supplement (ENSURE ENLIVE / ENSURE PLUS) LIQD Take 237 mLs by mouth 3 (three) times daily between meals. 12/18/21   Osvaldo Shipper, MD  folic acid (FOLVITE) 1 MG tablet  Take 1 tablet (1 mg total) by mouth daily. 12/19/21   Osvaldo Shipper, MD  magnesium oxide (MAG-OX) 400 (240 Mg) MG tablet Take 1 tablet (400 mg total) by mouth daily. 12/19/21   Osvaldo Shipper, MD  metoprolol tartrate (LOPRESSOR) 50 MG tablet Take 1 tablet (50 mg total) by mouth 2 (two) times daily. 12/18/21   Osvaldo Shipper, MD  Multiple Vitamin (MULTIVITAMIN WITH MINERALS) TABS tablet Take 1 tablet by mouth daily. 12/19/21   Osvaldo Shipper, MD  pantoprazole (PROTONIX) 40 MG tablet Take 1 tablet (40 mg total) by mouth daily. 12/18/21   Osvaldo Shipper, MD  polyethylene glycol (MIRALAX / GLYCOLAX) 17 g packet Take 17 g by mouth daily. 12/19/21   Osvaldo Shipper, MD  QUEtiapine (SEROQUEL) 50 MG tablet Take 1 tablet (50 mg total) by mouth 2 (two) times daily. 12/18/21   Osvaldo Shipper, MD  senna-docusate (SENOKOT-S) 8.6-50 MG tablet Take 2 tablets by mouth 2 (two) times daily. 12/18/21   Osvaldo Shipper, MD  thiamine (VITAMIN B-1) 100 MG tablet Take 1 tablet (100 mg total) by mouth daily. 12/19/21   Osvaldo Shipper, MD      Allergies    Patient has no known allergies.    Review of Systems   Review of Systems  All other systems reviewed and are negative.   Physical Exam Updated Vital Signs BP 131/85 (BP Location: Right Arm)   Pulse 99   Temp 98.4 F (36.9  C)   Resp 16   Ht 5\' 9"  (1.753 m)   Wt 55 kg   SpO2 93%   BMI 17.91 kg/m  Physical Exam Vitals and nursing note reviewed.  Constitutional:      General: He is not in acute distress.    Appearance: He is not toxic-appearing or diaphoretic.  HENT:     Head: Normocephalic and atraumatic.     Mouth/Throat:     Mouth: Mucous membranes are moist.  Eyes:     General: No scleral icterus.    Extraocular Movements: Extraocular movements intact.  Cardiovascular:     Rate and Rhythm: Normal rate and regular rhythm.  Pulmonary:     Effort: Pulmonary effort is normal.     Breath sounds: Normal breath sounds.  Abdominal:      General: Abdomen is flat.     Palpations: Abdomen is soft.  Musculoskeletal:     Cervical back: Normal range of motion and neck supple.  Skin:    General: Skin is warm and dry.  Neurological:     Mental Status: He is alert. Mental status is at baseline.  Psychiatric:        Mood and Affect: Mood normal.        Behavior: Behavior normal.        Thought Content: Thought content normal.     ED Results / Procedures / Treatments   Labs (all labs ordered are listed, but only abnormal results are displayed) Labs Reviewed  COMPREHENSIVE METABOLIC PANEL - Abnormal; Notable for the following components:      Result Value   Sodium 134 (*)    CO2 20 (*)    Glucose, Bld 124 (*)    All other components within normal limits  CBC WITH DIFFERENTIAL/PLATELET - Abnormal; Notable for the following components:   RBC 3.87 (*)    Hemoglobin 12.2 (*)    HCT 35.7 (*)    All other components within normal limits  ETHANOL    EKG None  Radiology No results found.  Procedures Procedures   Medications Ordered in ED Medications - No data to display  ED Course/ Medical Decision Making/ A&P   \                        Medical Decision Making  62 year old male presents to ED with brother requesting nursing facility placement. Pt has no acute complaints. He is in his baseline state of health. Labs ordered by triage provider. On exam, pt with chronic right sided weakness but ambulatory, alert and oriented, no signs of acute distress, medically stable. Discussed case with attending physician who recommended consult to PT/OT and case management. Pt reports previously doing well at nursing/rehab facility before his brother opted to take him out of facility on his own accord. No significant findings on lab work today. Overall, pt well appearing. Discussed with social worker in detail and as pt has no emergent need for evaluation today, no complaints, he will need to go through his PCP for further placement.  This information was provided to pt and brother. Pt stable for discharge and will follow up with PCP.    Final Clinical Impression(s) / ED Diagnoses Final diagnoses:  Housing problems  History of stroke    Rx / DC Orders ED Discharge Orders     None         Richardson Dopp 05/29/22 1725    Gloris Manchester,  MD 06/03/22 1704

## 2022-05-29 NOTE — Evaluation (Signed)
Physical Therapy Evaluation Patient Details Name: Brent Warren MRN: [id number] DOB: 28-Jul-1960 Today's Date: 05/29/2022  History of Present Illness  62 yo male presenting 11/11 with R-sided weakness, facial droop and slurred speech. 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 PMH includes: HTN, COPD, and alcohol abuse.  Clinical Impression  PT reviewed chart and had brief discussion with social worker prior to evaluation, in this short time the pt's PT orders were cancelled. PT completed PT evaluation as orders were still in place at time of chart review. Pt is mobilizing independently at this time, tolerating multiple dynamic gait and balance tasks well. Pt reports he was independent at [facility] without any falls. Pt reports he has not received PT services recently and was working out in the gym areas on his own. Pt does not demonstrate the need for post-acute PT services at this time. Due to lack of caregiver support and housing the pt is looking to return to long term care. PT recommends the pt return home with his brother until this can be arranged through PCP. Acute PT signing off.       Recommendations for follow up therapy are one component of a multi-disciplinary discharge planning process, led by the attending physician.  Recommendations may be updated based on patient status, additional functional criteria and insurance authorization.  Follow Up Recommendations Can patient physically be transported by private vehicle: Yes     Assistance Recommended at Discharge PRN  Patient can return home with the following  Direct supervision/assist for medications management;Direct supervision/assist for financial management;Assist for transportation    Equipment Recommendations None recommended by PT  Recommendations for Other Services       Functional Status Assessment Patient has not had a recent decline in their functional status     Precautions / Restrictions  Precautions Precautions: None Restrictions Weight Bearing Restrictions: No      Mobility  Bed Mobility Overal bed mobility: Independent                  Transfers Overall transfer level: Independent                      Ambulation/Gait Ambulation/Gait assistance: Modified independent (Device/Increase time) Gait Distance (Feet): 800 Feet Assistive device: None Gait Pattern/deviations: Step-through pattern Gait velocity: functional Gait velocity interpretation: >2.62 ft/sec, indicative of community ambulatory   General Gait Details: step-through gait, pt tolerates dynamic gait challenges including backward walking, changes in stride length and gait speed, changes in direction, and quick turns  Information systems manager Rankin (Stroke Patients Only)       Balance Overall balance assessment: Needs assistance Sitting-balance support: No upper extremity supported, Feet supported Sitting balance-Leahy Scale: Good     Standing balance support: No upper extremity supported Standing balance-Leahy Scale: Good             Rhomberg - Eyes Closed: 30   High Level Balance Comments: pt is able to retireve a pen from the floor, beding at waist without loss of balance             Pertinent Vitals/Pain Pain Assessment Pain Assessment: No/denies pain    Home Living Family/patient expects to be discharged to:: Private residence Living Arrangements: Other relatives (brother) Available Help at Discharge: Family;Available PRN/intermittently Type of Home: House             Additional Comments:  Pt was at [facility] since 12/2021, his brother discharged him from the facility 3 days ago in hopes for pt to return to a trailer independently. The trailer was unsafe to live in, therefore pt has been staying with his brother. Pt reports he is not able to care for himself and needs to return to SNF    Prior Function Prior Level  of Function : Independent/Modified Independent             Mobility Comments: ambulatory without DME, denies falls, reports he has not had PT services at [facility] in a while, utilizes exercise equipment there on his own ADLs Comments: reports being indep for ADLs, staff assists with IADLs     Hand Dominance   Dominant Hand: Right    Extremity/Trunk Assessment   Upper Extremity Assessment Upper Extremity Assessment: Defer to OT evaluation RUE Deficits / Details: limited shoulder flexion to 30*, elbow ROM is WFL, limited wrist extenstion but full wrist flexion. Digits contracted in flexion distally - painful to attempt PROM. Mild paraesthesias RUE Sensation: decreased light touch;decreased proprioception RUE Coordination: decreased fine motor;decreased gross motor    Lower Extremity Assessment Lower Extremity Assessment: RLE deficits/detail RLE Deficits / Details: grossly 4/5    Cervical / Trunk Assessment Cervical / Trunk Assessment: Normal  Communication   Communication: No difficulties  Cognition Arousal/Alertness: Awake/alert Behavior During Therapy: WFL for tasks assessed/performed, Anxious Overall Cognitive Status: Within Functional Limits for tasks assessed                                 General Comments: reduced insight into medical needs        General Comments General comments (skin integrity, edema, etc.): VSS on RA    Exercises     Assessment/Plan    PT Assessment Patient does not need any further PT services  PT Problem List         PT Treatment Interventions      PT Goals (Current goals can be found in the Care Plan section)       Frequency       Co-evaluation               AM-PAC PT "6 Clicks" Mobility  Outcome Measure Help needed turning from your back to your side while in a flat bed without using bedrails?: None Help needed moving from lying on your back to sitting on the side of a flat bed without using  bedrails?: None Help needed moving to and from a bed to a chair (including a wheelchair)?: None Help needed standing up from a chair using your arms (e.g., wheelchair or bedside chair)?: None Help needed to walk in hospital room?: None Help needed climbing 3-5 steps with a railing? 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

## 2022-05-29 NOTE — Telephone Encounter (Signed)
Pt's brother Brent Warren is calling in because he says the hospital can't do an FL2 because he hasn't been in the hospital in a while and that pt's doctor would have to do it. Brent Warren is requesting for Brent Warren to call him back to discuss this. Please advise.

## 2022-05-29 NOTE — Evaluation (Addendum)
Occupational Therapy Evaluation Patient Details Name: Brent Warren MRN: [id number] DOB: 07-10-1960 Today's Date: 05/29/2022   History of Present Illness 62 yo male presenting after leaving SNF too soon and needing placement. PMH includes: HTN, COPD, ICH in the left basal ganglia and alcohol abuse.   Clinical Impression   Brent Warren was evaluated s/p the above admission list. He reports being indep at SNF and denies falls, he recently left SNF to stay alone in his camper but realized he could not care for himself independently. Pt now requesting to return to SNF. Upon evaluation he was limited by baseline R hemiplegia. Overall he demonstrated mod I ability to transfer, ambulate without AD and complete ADLs with compensatory techniques. Due to . Pt does not need acute OT services but would benefit from SNF due to lack of assist and insight to medication management.   Active OT imminent order received prior to evaluation and canceled during session.      Recommendations for follow up therapy are one component of a multi-disciplinary discharge planning process, led by the attending physician.  Recommendations may be updated based on patient status, additional functional criteria and insurance authorization.   Assistance Recommended at Discharge Set up Supervision/Assistance  Patient can return home with the following Direct supervision/assist for medications management;Direct supervision/assist for financial management    Functional Status Assessment  Patient has had a recent decline in their functional status and/or demonstrates limited ability to make significant improvements in function in a reasonable and predictable amount of time  Equipment Recommendations  None recommended by OT       Precautions / Restrictions Precautions Precautions: None Restrictions Weight Bearing Restrictions: No      Mobility Bed Mobility Overal bed mobility: Independent          Transfers Overall transfer  level: Modified independent                        Balance Overall balance assessment: Mild deficits observed, not formally tested               ADL either performed or assessed with clinical judgement   ADL Overall ADL's : Modified independent;At baseline               General ADL Comments: mod I, at baseline. Good compensatory tehcniques for R hemibody management     Vision Baseline Vision/History: 0 No visual deficits Vision Assessment?: No apparent visual deficits     Perception Perception Perception Tested?: No   Praxis Praxis Praxis tested?: Not tested    Pertinent Vitals/Pain Pain Assessment Pain Assessment: No/denies pain     Hand Dominance Right   Extremity/Trunk Assessment Upper Extremity Assessment Upper Extremity Assessment: Defer to OT evaluation RUE Deficits / Details: limited shoulder flexion to 30*, elbow ROM is WFL, limited wrist extenstion but full wrist flexion. Digits contracted in flexion distally - painful to attempt PROM. Mild paraesthesias RUE Sensation: decreased light touch;decreased proprioception RUE Coordination: decreased fine motor;decreased gross motor   Lower Extremity Assessment Lower Extremity Assessment: RLE deficits/detail RLE Deficits / Details: grossly 4/5   Cervical / Trunk Assessment Cervical / Trunk Assessment: Normal   Communication Communication Communication: No difficulties   Cognition Arousal/Alertness: Awake/alert Behavior During Therapy: WFL for tasks assessed/performed, Anxious Overall Cognitive Status: Within Functional Limits for tasks assessed  General Comments: Verbose and anxious about getting back to facility, limited safety awareness. Pt reports that when he left the SNF he stopped taking his medications thinking he didn't need them any more. Now understands he needs increased assist and needs his medication     General Comments  VSS on  RA            Home Living Family/patient expects to be discharged to:: Private residence Living Arrangements: Other relatives (brother) Available Help at Discharge: Family;Available PRN/intermittently Type of Home: House         Additional Comments: Pt was at [facility] since 12/2021, his brother discharged him from the facility 3 days ago in hopes for pt to return to a trailer independently. The trailer was unsafe to live in, therefore pt has been staying with his brother. Pt reports he is not able to care for himself and needs to return to SNF      Prior Functioning/Environment Prior Level of Function : Independent/Modified Independent             Mobility Comments: ambulatory without DME, denies falls, reports he has not had PT services at [facility] in a while, utilizes exercise equipment there on his own ADLs Comments: reports being indep for ADLs, staff assists with IADLs        OT Problem List: Decreased strength;Decreased range of motion;Decreased activity tolerance;Impaired balance (sitting and/or standing);Impaired UE functional use      OT Treatment/Interventions: Self-care/ADL training;Neuromuscular education;Therapeutic activities;Patient/family education;Balance training    OT Goals(Current goals can be found in the care plan section) Acute Rehab OT Goals Patient Stated Goal: back to [facility] OT Goal Formulation: With patient Time For Goal Achievement: 06/12/22 Potential to Achieve Goals: Good  OT Frequency: Min 1X/week       AM-PAC OT "6 Clicks" Daily Activity     Outcome Measure Help from another person eating meals?: None Help from another person taking care of personal grooming?: None Help from another person toileting, which includes using toliet, bedpan, or urinal?: None Help from another person bathing (including washing, rinsing, drying)?: None Help from another person to put on and taking off regular upper body clothing?:  None Help from another person to put on and taking off regular lower body clothing?: None 6 Click Score: 24   End of Session Equipment Utilized During Treatment: Gait belt Nurse Communication: Mobility status  Activity Tolerance: Patient tolerated treatment well Patient left: in bed;with call bell/phone within reach (with PT)  OT Visit Diagnosis: Other abnormalities of gait and mobility (R26.89);Muscle weakness (generalized) (M62.81);Hemiplegia and hemiparesis Hemiplegia - Right/Left: Right Hemiplegia - dominant/non-dominant: Dominant Hemiplegia - caused by: Cerebral infarction                Time: 0981-1914 OT Time Calculation (min): 19 min Charges:  OT General Charges $OT Visit: 1 Visit OT Evaluation $OT Eval Low Complexity: 1 Low  Derenda Mis, OTR/L Acute Rehabilitation Services Office [phone] Secure Chat Communication Preferred   Donia Pounds 05/29/2022, 3:52 PM

## 2022-05-29 NOTE — Discharge Instructions (Addendum)
Follow up with your PCP regarding need for rehab/nursing facility placement. They can complete required paperwork for these facilities. For any worsening condition or acute concerns, return to ED for evaluation.
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
## 2022-05-30 ENCOUNTER — Telehealth: Payer: Self-pay

## 2022-05-30 NOTE — Telephone Encounter (Signed)
I tried reaching [facility] [facility] and the phone just rings,no one answers. I returned the call to patient's brother, Brent Warren, and explained that I have not been able to reach Ms Lowell Guitar to determine what level of care he is seeking and if they will accept him back.  I also explained that I have sent a message to Dr Laural Benes inquiring if she would need to see him prior to signing off on the [facility] since she has not seen him since he had his stroke.

## 2022-05-30 NOTE — Telephone Encounter (Unsigned)
Copied from CRM [id number]. Topic: General - Other >> May 30, 2022  4:08 PM Turkey B wrote: Reason for CRM: pt's brother, [name] called in to speak with Erskine Squibb B about FL2 forms.

## 2022-05-30 NOTE — Telephone Encounter (Signed)
I have already spoken to him this afternoon and have documented the conversation in another telephone encounter

## 2022-05-31 NOTE — Telephone Encounter (Signed)
I called [facility] again and was informed that Brent Warren was in a meeting, I left a message on her voicemail requesting a call back.  I also called patient's brother, Brent Warren, to inform him that Dr Laural Benes will need to see his brother before signing off on an FL2. I had to leave a message requesting a call back.   When [facility] calls back, please schedule the patient with Dr Laural Benes.

## 2022-06-01 ENCOUNTER — Telehealth: Payer: Self-pay | Admitting: Internal Medicine

## 2022-06-01 NOTE — Telephone Encounter (Signed)
Copied from CRM [id number]. Topic: Appointment Scheduling - Scheduling Inquiry for Clinic >> May 31, 2022 11:20 AM Brent Warren wrote: Reason for CRM: pt is needing an appt for fl2 with Dr. Laural Benes asap  CB#  [phone]  [facility] Jaffer Brother

## 2022-06-01 NOTE — Telephone Encounter (Signed)
Called & spoke to [facility] (authorized to receive information per DPR on file). Informed that an in-person appointment is needed. Scheduled appointment for 06/07/2022. [name] confirmed appointment.

## 2022-06-05 NOTE — Telephone Encounter (Signed)
Patient has an appointment scheduled with Dr Laural Benes - 06/07/2022.

## 2022-06-06 ENCOUNTER — Ambulatory Visit: Payer: Medicaid Other | Admitting: Neurology

## 2022-06-07 ENCOUNTER — Telehealth: Payer: Self-pay

## 2022-06-07 ENCOUNTER — Other Ambulatory Visit: Payer: Self-pay

## 2022-06-07 ENCOUNTER — Ambulatory Visit: Payer: Medicaid Other | Attending: Internal Medicine | Admitting: Internal Medicine

## 2022-06-07 ENCOUNTER — Encounter: Payer: Self-pay | Admitting: Internal Medicine

## 2022-06-07 VITALS — BP 133/83 | HR 71 | Temp 98.5°F | Resp 16 | Wt 133.0 lb

## 2022-06-07 DIAGNOSIS — Z720 Tobacco use: Secondary | ICD-10-CM | POA: Diagnosis not present

## 2022-06-07 DIAGNOSIS — I6930 Unspecified sequelae of cerebral infarction: Secondary | ICD-10-CM | POA: Diagnosis not present

## 2022-06-07 DIAGNOSIS — Z122 Encounter for screening for malignant neoplasm of respiratory organs: Secondary | ICD-10-CM | POA: Insufficient documentation

## 2022-06-07 DIAGNOSIS — Z79899 Other long term (current) drug therapy: Secondary | ICD-10-CM | POA: Diagnosis not present

## 2022-06-07 DIAGNOSIS — F1091 Alcohol use, unspecified, in remission: Secondary | ICD-10-CM | POA: Diagnosis not present

## 2022-06-07 DIAGNOSIS — Z8673 Personal history of transient ischemic attack (TIA), and cerebral infarction without residual deficits: Secondary | ICD-10-CM | POA: Insufficient documentation

## 2022-06-07 DIAGNOSIS — D692 Other nonthrombocytopenic purpura: Secondary | ICD-10-CM | POA: Insufficient documentation

## 2022-06-07 DIAGNOSIS — I700 Atherosclerosis of aorta: Secondary | ICD-10-CM | POA: Diagnosis not present

## 2022-06-07 DIAGNOSIS — F17210 Nicotine dependence, cigarettes, uncomplicated: Secondary | ICD-10-CM | POA: Insufficient documentation

## 2022-06-07 DIAGNOSIS — E538 Deficiency of other specified B group vitamins: Secondary | ICD-10-CM | POA: Diagnosis not present

## 2022-06-07 DIAGNOSIS — I10 Essential (primary) hypertension: Secondary | ICD-10-CM | POA: Diagnosis not present

## 2022-06-07 DIAGNOSIS — J439 Emphysema, unspecified: Secondary | ICD-10-CM | POA: Insufficient documentation

## 2022-06-07 DIAGNOSIS — R918 Other nonspecific abnormal finding of lung field: Secondary | ICD-10-CM

## 2022-06-07 DIAGNOSIS — Z23 Encounter for immunization: Secondary | ICD-10-CM | POA: Insufficient documentation

## 2022-06-07 MED ORDER — MAGNESIUM OXIDE -MG SUPPLEMENT 400 (240 MG) MG PO TABS
400.0000 mg | ORAL_TABLET | Freq: Every day | ORAL | 0 refills | Status: DC
Start: 2022-06-07 — End: 2023-06-18
  Filled 2022-06-07: qty 100, 100d supply, fill #0

## 2022-06-07 MED ORDER — ADULT MULTIVITAMIN W/MINERALS CH
1.0000 | ORAL_TABLET | Freq: Every day | ORAL | 1 refills | Status: DC
Start: 2022-06-07 — End: 2023-10-18
  Filled 2022-06-07: qty 100, 100d supply, fill #0

## 2022-06-07 MED ORDER — BUDESONIDE-FORMOTEROL FUMARATE 80-4.5 MCG/ACT IN AERO
2.0000 | INHALATION_SPRAY | Freq: Two times a day (BID) | RESPIRATORY_TRACT | 10 refills | Status: DC
Start: 2022-06-07 — End: 2023-08-15
  Filled 2022-06-07 – 2022-07-23 (×2): qty 10.2, 30d supply, fill #0
  Filled 2022-11-06: qty 10.2, 30d supply, fill #1
  Filled 2022-12-11: qty 10.2, 30d supply, fill #2
  Filled 2023-01-15: qty 10.2, 30d supply, fill #3
  Filled 2023-01-29 – 2023-03-28 (×2): qty 10.2, 30d supply, fill #4

## 2022-06-07 MED ORDER — FOLIC ACID 1 MG PO TABS
1.0000 mg | ORAL_TABLET | Freq: Every day | ORAL | 1 refills | Status: DC
Start: 2022-06-07 — End: 2022-10-31
  Filled 2022-06-07 – 2022-07-23 (×2): qty 100, 100d supply, fill #0

## 2022-06-07 MED ORDER — METOPROLOL TARTRATE 50 MG PO TABS
50.0000 mg | ORAL_TABLET | Freq: Two times a day (BID) | ORAL | 1 refills | Status: DC
Start: 2022-06-07 — End: 2022-10-31
  Filled 2022-06-07 – 2022-07-19 (×2): qty 180, 90d supply, fill #0

## 2022-06-07 MED ORDER — ALBUTEROL SULFATE HFA 108 (90 BASE) MCG/ACT IN AERS
2.0000 | INHALATION_SPRAY | Freq: Four times a day (QID) | RESPIRATORY_TRACT | 1 refills | Status: DC | PRN
Start: 2022-06-07 — End: 2022-12-11
  Filled 2022-06-07 – 2022-07-23 (×2): qty 18, 25d supply, fill #0
  Filled 2022-11-06: qty 18, 25d supply, fill #1

## 2022-06-07 MED ORDER — CYANOCOBALAMIN 1000 MCG PO TABS
1000.0000 ug | ORAL_TABLET | Freq: Every day | ORAL | 1 refills | Status: DC
Start: 2022-06-07 — End: 2022-10-15
  Filled 2022-06-07 – 2022-07-23 (×2): qty 90, 90d supply, fill #0

## 2022-06-07 NOTE — Telephone Encounter (Addendum)
I tried to reach [facility] [phone] but per Brent Warren, she was in a meeting and I had to leave a message on her voicemail requesting a call back..  I also left my name and call back number with Brent Warren and she said she would also give that message to [place].    I met with the patient and his brother, Brent Warren, when they were in the clinic this morning.  I explained to them that I have not been able to reach Brent Warren yet  and I had to leave her another message.  The patient admitted that he needs help with medication , meals, and showering.  I explained that ALF might be an option for for him.  He agreed and is interested in facilities in [place].  I provided them with a list of ALFs in [facility] that accept Medicaid and instructed them to call those facilities and inquire about available male beds.  I also encouraged them to visit any facilities that they are interested in. I told them that we can then send the [facility] for review to the facilitiy/facilities that have available beds.  They said they understood and will start to make the calls.
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
## 2022-06-22 ENCOUNTER — Telehealth: Payer: Self-pay

## 2022-06-22 DIAGNOSIS — Z111 Encounter for screening for respiratory tuberculosis: Secondary | ICD-10-CM

## 2022-06-22 NOTE — Telephone Encounter (Signed)
Copied from CRM [id number]. Topic: General - Other >> Jun 22, 2022 12:40 PM Franchot Heidelberg wrote: Pt's brother [name] called requesting to have the office fax a FL2 to Chana Bode at [facility] contact: [phone] Fax: [phone]  [address]  [place] [address]  Pt's brother wants a call back from either Erskine Squibb or PCP  Best contact: [phone]

## 2022-06-27 NOTE — Telephone Encounter (Signed)
Late entry:  I spoke to patient's brother , [name], on 06/25/2022 and he confirmed that he would like the FL2 to be sent to [facility].  I explained to him that Dr Laural Benes is out of the office this week and we may need to wait until she returns and can sign the FL2

## 2022-06-28 NOTE — Telephone Encounter (Signed)
Pt brother is calling to f/u on FL2 form. I advised, per Jane's comments below, that Dr. Laural Benes is out of the office this week and we may need to wait until she returns and can sign the FL2.  Stated it was mentioned that another Dr. Janae Bridgeman be able to sign off. He was just calling to f/u.  Please advise.

## 2022-07-02 NOTE — Telephone Encounter (Signed)
Call placed to patient unable to reach message left on VM.  Message was to advise that the form has been given to provider for signature and well faxed to the facility.

## 2022-07-03 NOTE — Telephone Encounter (Signed)
I spoke to [employer] and she said she did not receive the FL2 and asked that I email it to her. The FL2 was then sent to her [facility] via secure email.  She said they have a bed for him and are ready to go when they receive the FL2

## 2022-07-05 ENCOUNTER — Telehealth: Payer: Self-pay | Admitting: Internal Medicine

## 2022-07-05 ENCOUNTER — Inpatient Hospital Stay: Admission: RE | Admit: 2022-07-05 | Payer: No Typology Code available for payment source | Source: Ambulatory Visit

## 2022-07-05 NOTE — Telephone Encounter (Signed)
Copied from CRM [id number]. Topic: General - Other >> Jul 04, 2022  4:03 PM Turkey B wrote: Reason for CRM: Verlon Au from [facility] called in about if she can change order from ct nodule fu to lung cancer screening and it can be cosigned or what needs to be done.Marland Kitchen His appt is tomorrow. Please call back

## 2022-07-05 NOTE — Telephone Encounter (Signed)
Copied from CRM [id number]. Topic: General - Other >> Jul 05, 2022  5:08 PM Macon Large wrote: Reason for CRM: Pt returned call to [facility]. Pt requests that his call be returned. Cb# [phone]

## 2022-07-05 NOTE — Addendum Note (Signed)
Addended by: Jonah Blue B on: 07/05/2022 02:23 PM   Modules accepted: Orders

## 2022-07-05 NOTE — Telephone Encounter (Signed)
I spoke to Brent Warren, [facility] and she confirmed that she received the [facility] and she will reach out to him today to schedule move in for Monday, 07/09/2022. She said the only thing he will need is a negative TB test or CXR

## 2022-07-05 NOTE — Telephone Encounter (Signed)
I  returned the call to patient's brother, [name]. He said he has not heard from [employer] since he spoke to her about having paperwork sent to her from our clinic. I inquired if his brother had a recent TB test and he said yes, at the prior facility.  He plans to call Hydia tomorrow and will ask her to contact the other facility about the test results. I told him if needed, his brother can have the labwork done at this clinic and he said he understood

## 2022-07-05 NOTE — Telephone Encounter (Signed)
I tried to reach Brent Warren, Administrator- [facility] : [phone] to inquire if she will accept a quantiferon in lieu of a TB test, but her voicemail was not set up.    I called and left a message for patient's brother, Brent Warren, to discuss the need for TB test prior to admission at [facility]. Message left with call back requested.

## 2022-07-06 NOTE — Telephone Encounter (Signed)
Spoke with Chana Bode, Administrator- [facility] : [phone]. [facility]  will accept quantiferon in lieu of PPD. Quantiferon -Gold has to be drawn with in a 14 period  and or have one drawn within 3 months, however will be repeat once in the facility.

## 2022-07-09 ENCOUNTER — Telehealth: Payer: Self-pay

## 2022-07-09 NOTE — Telephone Encounter (Signed)
Call placed to patient's brother, Brent Warren, to inquire if he has spoken to [employer] and if he was able to obtain any prior TB testing results. If he has not, then he can bring his brother to our clinic lab and have the quantiferon gold drawn.  Message left with call back requested.

## 2022-07-09 NOTE — Telephone Encounter (Signed)
Spoke with patient  bother .  Verified name & DOB   Advised that patient needs come to lab for Quantiferon -Gold  test which is needed for [facility] assisted living. Agreed to bring in patient on tomorrow.

## 2022-07-09 NOTE — Telephone Encounter (Signed)
Pt's brother [name] is calling Erskine Squibb back- please advise

## 2022-07-10 ENCOUNTER — Ambulatory Visit: Payer: Medicaid Other | Attending: Internal Medicine

## 2022-07-10 DIAGNOSIS — Z111 Encounter for screening for respiratory tuberculosis: Secondary | ICD-10-CM

## 2022-07-11 NOTE — Telephone Encounter (Signed)
Patient had lab for quantiferon drawn yesterday.

## 2022-07-16 ENCOUNTER — Ambulatory Visit: Payer: Self-pay

## 2022-07-16 ENCOUNTER — Ambulatory Visit: Payer: No Typology Code available for payment source | Admitting: Internal Medicine

## 2022-07-16 NOTE — Telephone Encounter (Signed)
Patient called, left VM to return the call to the office to speak to the NT.   

## 2022-07-16 NOTE — Telephone Encounter (Signed)
Patient returned call- states his phone had "died"- call disconnected by patient after transfer- not sure he could hear me- attempted to call back- no answer- left message to call the office.

## 2022-07-16 NOTE — Telephone Encounter (Signed)
Pt hung up at transfer. Called pt back and LM on VM to call back to discuss hand pain.

## 2022-07-17 ENCOUNTER — Telehealth: Payer: Self-pay

## 2022-07-17 LAB — QUANTIFERON-TB GOLD PLUS
QuantiFERON Mitogen Value: >10 IU/mL
QuantiFERON Nil Value: 0.11 IU/mL
QuantiFERON TB1 Ag Value: 0.11 IU/mL
QuantiFERON TB2 Ag Value: 0.09 IU/mL
QuantiFERON-TB Gold Plus: NEGATIVE

## 2022-07-17 NOTE — Telephone Encounter (Signed)
Copied from CRM [id number]. Topic: General - Other >> Jul 16, 2022  5:38 PM Santiya F wrote: Reason for CRM: Pt's brother [name] [place] is calling in because he believes pt might have COVID and is taking him to get tested. [name] says he will call back with an update on pt's COVID status.

## 2022-07-18 ENCOUNTER — Other Ambulatory Visit: Payer: Self-pay | Admitting: Internal Medicine

## 2022-07-18 DIAGNOSIS — I10 Essential (primary) hypertension: Secondary | ICD-10-CM

## 2022-07-18 NOTE — Telephone Encounter (Signed)
Unable to refill per protocol, Rx request is too soon.  Requested Prescriptions  Pending Prescriptions Disp Refills   metoprolol tartrate (LOPRESSOR) 50 MG tablet 180 tablet 1    Sig: Take 1 tablet (50 mg total) by mouth 2 (two) times daily.     Cardiovascular:  Beta Blockers Passed - 07/18/2022 11:41 AM      Passed - Last BP in normal range    BP Readings from Last 1 Encounters:  06/07/22 133/83         Passed - Last Heart Rate in normal range    Pulse Readings from Last 1 Encounters:  06/07/22 71         Passed - Valid encounter within last 6 months    Recent Outpatient Visits           1 month ago Essential hypertension   [place] [facility] Marcine Matar, MD   1 year ago Bilateral impacted cerumen   [facility] [place], Marzella Schlein, [place]   1 year ago Essential hypertension   [place] [facility] Marcine Matar, MD   1 year ago Essential hypertension   [place] [facility] Marcine Matar, MD   2 years ago Essential hypertension   [facility] Marcine Matar, MD       Future Appointments             In 2 months Laural Benes, Binnie Rail, MD [facility]

## 2022-07-18 NOTE — Telephone Encounter (Signed)
Copied from CRM [id number]. Topic: General - Other >> Jul 18, 2022 10:28 AM Everette C wrote: Reason for CRM: Medication Refill - Medication: Rx #: [id number]  metoprolol tartrate (LOPRESSOR) 50 MG tablet [[id number]]    Has the patient contacted their pharmacy? Yes.   (Agent: If no, request that the patient contact the pharmacy for the refill. If patient does not wish to contact the pharmacy document the reason why and proceed with request.) (Agent: If yes, when and what did the pharmacy advise?)  Preferred Pharmacy (with phone number or street name): [id number] [place] Otto, [place] - [address] AT [facility] OF [facility] & [address] [place] [address] Phone: [phone] Fax: [phone] Hours: Not open 24 hours   Has the patient been seen for an appointment in the last year OR does the patient have an upcoming appointment? Yes.    Agent: Please be advised that RX refills may take up to 3 business days. We ask that you follow-up with your pharmacy.

## 2022-07-19 ENCOUNTER — Telehealth: Payer: Self-pay | Admitting: Internal Medicine

## 2022-07-19 ENCOUNTER — Other Ambulatory Visit: Payer: Self-pay

## 2022-07-19 NOTE — Telephone Encounter (Signed)
Pt's brother [name] is calling in because he called in to get pt's metoprolol tartrate (LOPRESSOR) 50 MG tablet [[id number]] refilled and per [facility], pt says he is completely out of medication.

## 2022-07-19 NOTE — Telephone Encounter (Signed)
Patient has refills available at his pharmacy. I placed these for him.

## 2022-07-20 ENCOUNTER — Telehealth: Payer: Self-pay

## 2022-07-20 NOTE — Telephone Encounter (Signed)
Pt's brother [place], on [place], given lab results per notes of Dr. Laural Benes on 07/17/22. Pt's brother verbalized understanding. He asked if a copy could be mailed to his home address on file and one sent to the assisted living facility. He didn't have the fax, but the number to call for it is:  Chana Bode #[phone]   Let patient and his brother know that the screening blood test for TB was negative.  Let me know if he needs a copy of it to give to his assisted living facility.  Written by Marcine Matar, MD on 07/17/2022  5:31 PM EDT

## 2022-07-23 ENCOUNTER — Other Ambulatory Visit: Payer: Self-pay

## 2022-07-23 NOTE — Telephone Encounter (Signed)
I spoke to Brent Warren, Administrator/[facility] and informed her that I have the results of patient's quantiferon test and will send her the results.  She asked that I have patient's brother, Brent Warren, call her.    Quantiferon results sent to [employer] via [employer]  I called patient's brother, Brent Warren, and informed him that Brent Warren would like her to call him. He said he has her phone number and will giver he a call.

## 2022-07-24 ENCOUNTER — Other Ambulatory Visit: Payer: Self-pay

## 2022-08-02 ENCOUNTER — Telehealth: Payer: Self-pay

## 2022-08-02 NOTE — Telephone Encounter (Signed)
Call received from patient's brother, [name].  He said that he spoke to the representative from [employer] and was told that his brother had to give them all of his money every month except for $90,  so he does not want to do that.  He thought Medicaid covers 100%.  I explained that is true but money from the monthly check is used to cover expenses as well- food, medications, room/board.    He then said he called [facility] and his brother  can go there for free because he needs rehab. I explained that is it not "free"  Medicaid will pay for rehab facility if a person qualifies, and if they qualify, it is for a limited time. I instructed him to call [facility] back and ask more specific information about the services they will offer and how long he would receive therapy and then what would happen when therapy ends... sometimes therapy is only for 2 weeks. He also may have used his allotted rehab days at [facility]. He said he would call and follow up. I told him to get the name and phone number of the person he speaks with and I can contact them if we need to submit an FL2.  [name] said that he understood.

## 2022-08-17 ENCOUNTER — Telehealth: Payer: Self-pay | Admitting: Internal Medicine

## 2022-08-17 NOTE — Telephone Encounter (Signed)
Copied from CRM [id number]. Topic: General - Other >> Aug 16, 2022  5:06 PM Santiya F wrote: Reason for CRM: Pt's brother [name] is calling in requesting to speak with Erskine Squibb regarding the FL2 to [facility]. [name] wants to know how long is the FL2 good for and will he need to have another done in the future. Please follow up with [name].

## 2022-08-20 NOTE — Telephone Encounter (Signed)
Call returned to patient's brother , Brent Warren  He said he is still trying to get his brother into [facility] but he could not tell me what he is looking for - SNF or rehab.  They do not have ALF.  I asked if he has a contact person at [facility] and he said it was either [name] or [name].  I told him that I would need to call [facility] to obtain more information.     I called [facility] and spoke to [employer]. She had to take a message for someone in admissions to call me back.  She said that they have employees with all of those names working there.

## 2022-08-22 ENCOUNTER — Telehealth: Payer: Self-pay | Admitting: Internal Medicine

## 2022-08-22 NOTE — Telephone Encounter (Signed)
Copied from CRM [id number]. Topic: General - Other >> Aug 21, 2022  5:03 PM Macon Large wrote: Reason for CRM: Pt brother [name] requests to speak with Erskine Squibb. Cb# [phone]

## 2022-08-23 ENCOUNTER — Other Ambulatory Visit: Payer: Self-pay

## 2022-08-23 ENCOUNTER — Other Ambulatory Visit (HOSPITAL_COMMUNITY): Payer: Self-pay

## 2022-08-28 ENCOUNTER — Ambulatory Visit: Payer: MEDICAID | Attending: Physician Assistant | Admitting: Physician Assistant

## 2022-08-28 ENCOUNTER — Other Ambulatory Visit: Payer: Self-pay

## 2022-08-28 VITALS — BP 130/79 | HR 57 | Ht 69.0 in | Wt 126.4 lb

## 2022-08-28 DIAGNOSIS — M720 Palmar fascial fibromatosis [Dupuytren]: Secondary | ICD-10-CM | POA: Diagnosis not present

## 2022-08-28 DIAGNOSIS — Z720 Tobacco use: Secondary | ICD-10-CM

## 2022-08-28 DIAGNOSIS — H9193 Unspecified hearing loss, bilateral: Secondary | ICD-10-CM

## 2022-08-28 MED ORDER — MELOXICAM 7.5 MG PO TABS
7.5000 mg | ORAL_TABLET | Freq: Every day | ORAL | 2 refills | Status: DC
Start: 2022-08-28 — End: 2022-10-31
  Filled 2022-08-28: qty 30, 30d supply, fill #0

## 2022-08-28 NOTE — Telephone Encounter (Signed)
I spoke to [facility] and explained to him that I contacted [facility] admissions last week and have not heard back.. I will call them again today.  I spoke to [facility] and explained patient's needs.  She requested that the [facility] and demographic information be emailed to her at [email or web address].  She explained that they will review the information and verify insurance coverage. She noted that there may be an out of pocket expense before the special assistance Medicaid goes into effect. They currently have 1 or 2 male beds available.   Referral emailed as requested.  I also spoke to the patient when he was in the clinic this afternoon and explained that I was sending the referral information to [facility].

## 2022-08-28 NOTE — Progress Notes (Signed)
Patient ID: Brent Warren, male   DOB: 1960/08/21, 62 y.o.   MRN: [id number]   Brent Warren, is a 62 y.o. male  WGN:[id number]  HQI:[id number]  DOB - 1960/03/24  Chief Complaint  Patient presents with   Hand Pain    Pain in left hand since may 2024       Subjective:   Brent Warren is a 62 y.o. male here today for decreased hearing and wants to see if he is eligible for hearing aids.  He does also have a h/o cerumen impaction.  He injured his L hand in May on some sliding doors and is continuing having problems.  His 4th and 5th finger on his L hand are drawn in and he can't open them and his thumb hurts.  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  He was sober in [place] for about 8 years in the 90s.    No problems updated.  ALLERGIES: No Known Allergies  PAST MEDICAL HISTORY: Past Medical History:  Diagnosis Date   Alcoholic (HCC)    Chest pain 04/02/2017   COPD (chronic obstructive pulmonary disease) (HCC)    Empyema of pleural space (HCC) 05/2015   ETOH abuse 11/06/2016   Hepatitis 05/22/2015   HTN (hypertension)    Immunization due 11/06/2016   Lung nodule, multiple 11/08/2015   Malnutrition of moderate degree 06/02/2015   Pneumonia 05/2015   Pulmonary emphysema (HCC) 11/06/2016   Tobacco abuse    Urge incontinence 11/06/2016    MEDICATIONS AT HOME: Prior to Admission medications   Medication Sig Start Date End Date Taking? Authorizing Provider  acetaminophen (TYLENOL) 325 MG tablet Take 2 tablets (650 mg total) by mouth every 4 (four) hours as needed for mild pain (or temp > 37.5 C (99.5 F)). 12/18/21  Yes Osvaldo Shipper, MD  albuterol (PROVENTIL) (2.5 MG/3ML) 0.083% nebulizer solution Take 3 mLs (2.5 mg total) by nebulization every 6 (six) hours as needed for wheezing or shortness of breath. 07/06/21  Yes Marcine Matar, MD  albuterol (VENTOLIN HFA) 108 (90 Base) MCG/ACT inhaler Inhale 2 puffs into the lungs every 6 (six) hours as needed for  wheezing or shortness of breath. 06/07/22  Yes Marcine Matar, MD  baclofen (LIORESAL) 10 MG tablet Take 1 tablet (10 mg total) by mouth 2 (two) times daily. 01/30/22  Yes McCue, Shanda Bumps, NP  budesonide-formoterol (SYMBICORT) 80-4.5 MCG/ACT inhaler Inhale 2 puffs into the lungs 2 (two) times daily 06/07/22  Yes Marcine Matar, MD  cyanocobalamin 1000 MCG tablet Take 1 tablet (1,000 mcg total) by mouth daily. 06/07/22  Yes Marcine Matar, MD  feeding supplement (ENSURE ENLIVE / ENSURE PLUS) LIQD Take 237 mLs by mouth 3 (three) times daily between meals. 12/18/21  Yes Osvaldo Shipper, MD  folic acid (FOLVITE) 1 MG tablet Take 1 tablet (1 mg total) by mouth daily. 06/07/22  Yes Marcine Matar, MD  magnesium oxide (MAG-OX) 400 (240 Mg) MG tablet Take 1 tablet (400 mg total) by mouth daily. 06/07/22  Yes Marcine Matar, MD  metoprolol tartrate (LOPRESSOR) 50 MG tablet Take 1 tablet (50 mg total) by mouth 2 (two) times daily. 06/07/22  Yes Marcine Matar, MD  Multiple Vitamin (MULTIVITAMIN WITH MINERALS) TABS tablet Take 1 tablet by mouth daily. 06/07/22  Yes Marcine Matar, MD  pantoprazole (PROTONIX) 40 MG tablet Take 1 tablet (40 mg total) by mouth daily. 12/18/21  Yes Osvaldo Shipper, MD  polyethylene glycol (  MIRALAX / GLYCOLAX) 17 g packet Take 17 g by mouth daily. 12/19/21  Yes Osvaldo Shipper, MD  QUEtiapine (SEROQUEL) 50 MG tablet Take 1 tablet (50 mg total) by mouth 2 (two) times daily. 12/18/21  Yes Osvaldo Shipper, MD  senna-docusate (SENOKOT-S) 8.6-50 MG tablet Take 2 tablets by mouth 2 (two) times daily. 12/18/21  Yes Osvaldo Shipper, MD  thiamine (VITAMIN B-1) 100 MG tablet Take 1 tablet (100 mg total) by mouth daily. 12/19/21  Yes Osvaldo Shipper, MD    ROS: Neg resp Neg cardiac Neg GI Neg GU Neg psych Neg neuro  Objective:   Vitals:   08/28/22 1602  BP: 130/79  Pulse: (!) 57  SpO2: 98%  Weight: 126 lb 6.4 oz (57.3 kg)  Height: 5\' 9"  (1.753 m)    Exam General appearance : Awake, alert, not in any distress. Speech Clear. Not toxic looking, thin, appears older than stated age HEENT: Atraumatic and Normocephalic, L canal with some cerumen but not blocked.  L TM WNL.  R canal without cerumen and TM WNL.  He is hoh.  Poor dentition Neck: Supple, no JVD. No cervical lymphadenopathy.  Chest: Good air entry bilaterally, CTAB.  No rales/rhonchi/wheezing CVS: S1 S2 regular, no murmurs.  Extremities: B/L Lower Ext shows no edema, both legs are warm to touch.  L hand with TTP over tendon of thumb and the 4th and 5th digits are contracted in and cannot be opened passively or actively Neurology: Awake alert, and oriented X 3, CN II-XII intact, Non focal Skin: No Rash  Data Review Lab Results  Component Value Date   HGBA1C 5.1 11/19/2021   HGBA1C 5.3 09/12/2017   HGBA1C 5.2 02/07/2016    Assessment & Plan   1. Dupuytren's contracture - Ambulatory referral to Hand Surgery Meloxicam sent  2. Decreased hearing of both ears - Ambulatory referral to ENT   [employer].com.cy (website) or [phone] is the information for alcoholics anonymous Both are free and immediately available for help with alcohol and drug use   Return for appointment with Dr Laural Benes October .  The patient was given clear instructions to go to ER or return to medical center if symptoms don't improve, worsen or new problems develop. The patient verbalized understanding. The patient was told to call to get lab results if they haven't heard anything in the next week.      Georgian Co, PA-C [facility] [place], [place] [phone]   08/28/2022, 4:41 PM

## 2022-08-28 NOTE — Patient Instructions (Addendum)
[employer]com.cy (website) or [phone] is the information for alcoholics anonymous Both are free and immediately available for help with alcohol and drug use    Managing the Challenge of Quitting Smoking Quitting smoking is a physical and mental challenge. You may have cravings, withdrawal symptoms, and temptation to smoke. Before quitting, work with your health care provider to make a plan that can help you manage quitting. Making a plan before you quit may keep you from smoking when you have the urge to smoke while trying to quit. How to manage lifestyle changes Managing stress Stress can make you want to smoke, and wanting to smoke may cause stress. It is important to find ways to manage your stress. You could try some of the following: Practice relaxation techniques. Breathe slowly and deeply, in through your nose and out through your mouth. Listen to music. Soak in a bath or take a shower. Imagine a peaceful place or vacation. Get some support. Talk with family or friends about your stress. Join a support group. Talk with a counselor or therapist. Get some physical activity. Go for a walk, run, or bike ride. Play a favorite sport. Practice yoga.  Medicines Talk with your health care provider about medicines that might help you deal with cravings and make quitting easier for you. Relationships Social situations can be difficult when you are quitting smoking. To manage this, you can: Avoid parties and other social situations where people might be smoking. Avoid alcohol. Leave right away if you have the urge to smoke. Explain to your family and friends that you are quitting smoking. Ask for support and let them know you might be a bit grumpy. Plan activities where smoking is not an option. General instructions Be aware that many people gain weight after they quit smoking. However, not everyone does. To keep from gaining weight, have a plan in place before you quit, and stick to the  plan after you quit. Your plan should include: Eating healthy snacks. When you have a craving, it may help to: Eat popcorn, or try carrots, celery, or other cut vegetables. Chew sugar-free gum. Changing how you eat. Eat small portion sizes at meals. Eat 4-6 small meals throughout the day instead of 1-2 large meals a day. Be mindful when you eat. You should avoid watching television or doing other things that might distract you as you eat. Exercising regularly. Make time to exercise each day. If you do not have time for a long workout, do short bouts of exercise for 5-10 minutes several times a day. Do some form of strengthening exercise, such as weight lifting. Do some exercise that gets your heart beating and causes you to breathe deeply, such as walking fast, running, swimming, or biking. This is very important. Drinking plenty of water or other low-calorie or no-calorie drinks. Drink enough fluid to keep your urine pale yellow.  How to recognize withdrawal symptoms Your body and mind may experience discomfort as you try to get used to not having nicotine in your system. These effects are called withdrawal symptoms. They may include: Feeling hungrier than normal. Having trouble concentrating. Feeling irritable or restless. Having trouble sleeping. Feeling depressed. Craving a cigarette. These symptoms may surprise you, but they are normal to have when quitting smoking. To manage withdrawal symptoms: Avoid places, people, and activities that trigger your cravings. Remember why you want to quit. Get plenty of sleep. Avoid coffee and other drinks that contain caffeine. These may worsen some of your symptoms. How to  manage cravings Come up with a plan for how to deal with your cravings. The plan should include the following: A definition of the specific situation you want to deal with. An activity or action you will take to replace smoking. A clear idea for how this action will  help. The name of someone who could help you with this. Cravings usually last for 5-10 minutes. Consider taking the following actions to help you with your plan to deal with cravings: Keep your mouth busy. Chew sugar-free gum. Suck on hard candies or a straw. Brush your teeth. Keep your hands and body busy. Change to a different activity right away. Squeeze or play with a ball. Do an activity or a hobby, such as making bead jewelry, practicing needlepoint, or working with wood. Mix up your normal routine. Take a short exercise break. Go for a quick walk, or run up and down stairs. Focus on doing something kind or helpful for someone else. Call a friend or family member to talk during a craving. Join a support group. Contact a quitline. Where to find support To get help or find a support group: Call the National Cancer Institute's Smoking Quitline: 1-800-QUIT-NOW (443)147-1979) Text QUIT to SmokefreeTXT: 454098 Where to find more information Visit these websites to find more information on quitting smoking: U.S. Department of Health and Human Services: www.smokefree.gov American Lung Association: www.freedomfromsmoking.org Centers for Disease Control and Prevention (CDC): FootballExhibition.com.br American Heart Association: www.heart.org Contact a health care provider if: You want to change your plan for quitting. The medicines you are taking are not helping. Your eating feels out of control or you cannot sleep. You feel depressed or become very anxious. Summary Quitting smoking is a physical and mental challenge. You will face cravings, withdrawal symptoms, and temptation to smoke again. Preparation can help you as you go through these challenges. Try different techniques to manage stress, handle social situations, and prevent weight gain. You can deal with cravings by keeping your mouth busy (such as by chewing gum), keeping your hands and body busy, calling family or friends, or contacting a  quitline for people who want to quit smoking. You can deal with withdrawal symptoms by avoiding places where people smoke, getting plenty of rest, and avoiding drinks that contain caffeine. This information is not intended to replace advice given to you by your health care provider. Make sure you discuss any questions you have with your health care provider. Document Revised: 12/16/2020 Document Reviewed: 12/16/2020 Elsevier Patient Education  2024 Elsevier Inc. Dupuytren's Contracture Dupuytren's contracture can be a hard condition to deal with. Your health care provider and those close to you can help you manage it. When you have this condition, the tissue under the skin of your palm gets thick. This causes one or more of your fingers to curl inward (contract) toward the palm. After a while, the fingers may not be able to straighten out. This condition can affect some or all of your fingers and the palms of both hands. Dupuytren's contracture is a long-term (chronic) condition that develops (progresses) slowly over time. While there is no cure yet, symptoms can be managed and treatment can slow it down. This condition is usually not dangerous but it can interfere with your everyday tasks. What are the causes?  This condition is caused by tissue (fascia) in your palm that gets thicker and tighter. When the tissue thickens, it pulls on the cords of your tissue (tendons) that control finger movement. This causes the fingers  to contract. The cause of your tissue getting thick is not known. However, the condition is often passed along from parent to child (inherited). What increases the risk? Being 47 years of age or older. Being male. Having a family history of this condition. Using tobacco products, including cigarettes, chewing tobacco, and e-cigarettes. Drinking too much alcohol. Having diabetes. Having a seizure disorder. What are the signs or symptoms? Early symptoms of this condition may  include: Thick, wrinkled skin on the hand. One or more lumps (nodules) on the palm. Lumps may sometimes feel tender or painful. Later symptoms of this condition may include: Thick cords of tissue in the palm. Fingers curled up toward the palm. Inability to straighten the fingers into their usual position. Discomfort when holding or grabbing objects. How is this diagnosed? This condition is diagnosed with a physical exam. This may include: Looking at your hands and feeling your palms. This is to check for thickened tissue and lumps. Measuring finger motion. Doing the [place] tabletop test. This is where you may try to put your hand on a surface, with your palm down and your fingers straight out. How is this treated? There is no cure for this condition, but treatment can relieve your discomfort and make symptoms more manageable. Treatment options may include: Physical therapy. This can strengthen your hand and increase flexibility. Occupational therapy. This can help you with everyday tasks that may have become more difficult. Shots (injections). Substances may be injected into your hand, such as: Medicines that help to decrease swelling and pain (corticosteroids). Enzymes (collagenase) to weaken thick tissue. After a collagenase injection, your provider may stretch your fingers. Needle aponeurotomy. A needle is pushed through the skin and into the tissue. Moving the needle against the tissues can weaken or break up the thick tissue. Surgery. This may be needed if your condition causes discomfort or interferes with everyday activities. Physical therapy is usually needed after surgery. No treatment is guaranteed to cure this condition. It is common for the condition to come back. Follow these instructions at home: Hand care Take these actions to help protect your hand from possible injury: Use tools that have padded grips. Wear protective gloves while you work with your hands. Avoid repeated  hand movements. General instructions Take over-the-counter and prescription medicines only as told by your provider. Manage any other conditions that you have, such as diabetes. If physical therapy was prescribed, do exercises as told by your provider. Do not use any products that contain nicotine or tobacco. These products include cigarettes, chewing tobacco, and vaping devices, such as e-cigarettes. If you need help quitting, ask your provider. If you drink alcohol: Limit how much you have to: 0-1 drink a day if you are male. 0-2 drinks a day if you are male. Know how much alcohol is in your drink. In the [place] one drink is one 12 oz bottle of beer (355 mL), one 5 oz glass of wine (148 mL), or one 1 oz glass of hard liquor (44 mL). Keep all follow-up visits. Your provider will check to see if your treatments need adjusting. Where to find support Dupuytren Research Group: dupuytrens.org International Dupuytren Society: dupuytren-online.info Contact a health care provider if: You develop new symptoms or your symptoms get worse. You have pain that gets worse or does not get better with medicine. You have difficulty or discomfort with everyday tasks. You develop numbness or tingling. Get help right away if: You have severe pain. Your fingers change color or become  unusually cold. This information is not intended to replace advice given to you by your health care provider. Make sure you discuss any questions you have with your health care provider. Document Revised: 01/15/2022 Document Reviewed: 12/06/2021 Elsevier Patient Education  2024 [employer].

## 2022-08-28 NOTE — Telephone Encounter (Signed)
Message received from Lollie Sails, Admissions Director- [facility] stating she has forwarded the [facility] and demo. Info to their Director of Nursing for review.

## 2022-08-29 NOTE — Telephone Encounter (Signed)
Message received from [facility] stating that they are not in network with patient's insurance and not able to accept the referral.  I called patient's brother, [name], and explained the above information.  He was frustrated because he was under the impression when he called [facility], they would be able to accept the patient.  I told him that he can call [facility] to inquire who they are in network with and he can also call the ALFs from the list that I provided for him.  He said he can call other facilities and  I told him to call me with any questions.

## 2022-09-21 ENCOUNTER — Ambulatory Visit: Payer: Self-pay

## 2022-09-21 NOTE — Telephone Encounter (Signed)
Chief Complaint: Ear Wax buildup, needs flushing Symptoms: decreased hearing left ear Frequency: over 3 months, getting worse Pertinent Negatives: Patient denies pain, other symptoms Disposition: [] ED /[] Urgent Care (no appt availability in office) / [x] Appointment(In office/virtual)/ []  [place] [facility]/ [] Home Care/ [] Refused Recommended Disposition /[] [place] [facility]/ []  Follow-up with PCP Additional Notes: Given earliest available appointment 10/11/22 with Georgian Co, PA-C, placed on waiting list, advised [facility] location, dates/times provided.    Summary: Ears congested, hearing difficulty   Pt's brother called reporting that the patient is unable to hear as well, the patient's brother says he needs his ears cleaned. He says the last time this happened that was the solution, he is seeking an appt however there is nothing soon enough. Please advise  Best contact: [phone]     Reason for Disposition  Complete hearing loss in either ear  Answer Assessment - Initial Assessment Questions 1. LOCATION: "Which ear is involved?"      Left ear 2. SYMPTOMS: "What are the main symptoms?" (e.g., fullness, decreased hearing, itching, discomfort)     Hearing decreased 3. ONSET: "When did the symptom start?"     More than 3 months 4. PAIN: "Is there any earache?" "How bad is it?"  (Scale 1-10; or mild, moderate, severe)     No 5. OBJECTS: "Do you use cotton swabs (Q-tips) in your ear?" "Have you put anything else in your ear?"     No 6. EARWAX HISTORY: "Have you had problems with earwax before?" If Yes, ask: "What did you do the last time?"     Yes, had to come to the office to have it flushed out  Protocols used: Earwax-A-AH

## 2022-10-11 ENCOUNTER — Ambulatory Visit: Payer: No Typology Code available for payment source | Admitting: Internal Medicine

## 2022-10-11 ENCOUNTER — Ambulatory Visit: Payer: MEDICAID | Admitting: Physician Assistant

## 2022-10-11 ENCOUNTER — Other Ambulatory Visit (HOSPITAL_COMMUNITY): Payer: Self-pay

## 2022-10-12 ENCOUNTER — Ambulatory Visit: Payer: No Typology Code available for payment source | Admitting: Internal Medicine

## 2022-10-15 ENCOUNTER — Other Ambulatory Visit: Payer: Self-pay

## 2022-10-15 ENCOUNTER — Encounter: Payer: Self-pay | Admitting: Internal Medicine

## 2022-10-15 ENCOUNTER — Ambulatory Visit: Payer: MEDICAID | Attending: Internal Medicine | Admitting: Internal Medicine

## 2022-10-15 VITALS — BP 110/68 | HR 52 | Temp 97.8°F | Ht 69.0 in | Wt 127.0 lb

## 2022-10-15 DIAGNOSIS — D649 Anemia, unspecified: Secondary | ICD-10-CM | POA: Diagnosis not present

## 2022-10-15 DIAGNOSIS — J439 Emphysema, unspecified: Secondary | ICD-10-CM

## 2022-10-15 DIAGNOSIS — Z23 Encounter for immunization: Secondary | ICD-10-CM

## 2022-10-15 DIAGNOSIS — I10 Essential (primary) hypertension: Secondary | ICD-10-CM

## 2022-10-15 DIAGNOSIS — E538 Deficiency of other specified B group vitamins: Secondary | ICD-10-CM

## 2022-10-15 DIAGNOSIS — R918 Other nonspecific abnormal finding of lung field: Secondary | ICD-10-CM

## 2022-10-15 DIAGNOSIS — F17210 Nicotine dependence, cigarettes, uncomplicated: Secondary | ICD-10-CM

## 2022-10-15 DIAGNOSIS — Z122 Encounter for screening for malignant neoplasm of respiratory organs: Secondary | ICD-10-CM

## 2022-10-15 DIAGNOSIS — Z720 Tobacco use: Secondary | ICD-10-CM

## 2022-10-15 DIAGNOSIS — H6123 Impacted cerumen, bilateral: Secondary | ICD-10-CM

## 2022-10-15 DIAGNOSIS — I6930 Unspecified sequelae of cerebral infarction: Secondary | ICD-10-CM

## 2022-10-15 MED ORDER — CYANOCOBALAMIN 1000 MCG PO TABS
1000.0000 ug | ORAL_TABLET | Freq: Every day | ORAL | 1 refills | Status: DC
Start: 2022-10-15 — End: 2022-10-31
  Filled 2022-10-15: qty 130, 130d supply, fill #0
  Filled 2022-10-15: qty 138, 138d supply, fill #0
  Filled 2022-10-15: qty 90, 90d supply, fill #0

## 2022-10-15 NOTE — Progress Notes (Signed)
Patient ID: Brent Warren, male    DOB: Mar 29, 1960  MRN: [id number]  CC: Hypertension (HTN f/u. Nicki Reaper that ear wax drops given at home did not help - requesting to have ear looked at [facility] to shingles vax )   Subjective: Brent Warren is a 62 y.o. male who presents for chronic ds management. 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  Currently still living with his brother and sister-in-law.  He tells me that he plans to get new camper and go back to [place] where his family has land by the end of the yr.  Will live in camper.   HTN/Hx of CVA: taking Metoprolol 50 mg BID Does his own cooking; limits salt in foods.  Reports good appetite.  He has not had any falls.  States that he is still a little weak in his right upper extremity especially the grip. No CP, SOB, LE edema  COPD/ smoker:  still smoking about 1/2 pk a day.  Smoked for over 50 yrs. Referred for CT scan of lung on last visit for f/u lung nodules/lung cancer screen.  He does not have a phone.  Number listed on his chart is that of his brother.  States that his brother usually would take the message and make arrangements for him.  He does not recall being told by his brother whether [facility] imaging had called to schedule his CAT scan. Has Symbicort but does not use it daily; uses it an Proventil PRN.  Feels breathing is stable  On last visit with me, patient told me that his fecal incontinence had resolved.  No longer needs incontinence supplies.  I received form from [employer] recently.  Patient tells me to disregard it.  States that he spoke with them and told them that he did not need any more supplies from them.  Not  able to hear out of LT ear  History of chronic anemia mainly macrocytic.  Has vitamin B12 deficiency.  Reports running out of B12 supplement.  Requests new prescription today.  Hx of ETOH he remains alcohol free. HM:  had flu shot and COVID-19 vaccines 2 wks ago at [facility] [address] Patient Active Problem List   Diagnosis Date Noted   Aortic atherosclerosis (HCC) 06/07/2022   Vitamin B12 deficiency 06/07/2022   Elevated MCV 11/20/2021   ICH (intracerebral hemorrhage) (HCC) 11/18/2021   COVID-19 vaccine series completed 10/26/2019   Polyarthritis 02/05/2019   Chest pain in adult 04/02/2017   Immunization due 11/06/2016   Pulmonary emphysema (HCC) 11/06/2016   Urge incontinence 11/06/2016   Tobacco abuse 11/06/2016   ETOH abuse 11/06/2016   Lung nodule, multiple 11/08/2015   HTN (hypertension) 08/01/2015   Hyponatremia 05/22/2015   Hepatitis 05/22/2015     Current Outpatient Medications on File Prior to Visit  Medication Sig Dispense Refill   acetaminophen (TYLENOL) 325 MG tablet Take 2 tablets (650 mg total) by mouth every 4 (four) hours as needed for mild pain (or temp > 37.5 C (99.5 F)).     albuterol (PROVENTIL) (2.5 MG/3ML) 0.083% nebulizer solution Take 3 mLs (2.5 mg total) by nebulization every 6 (six) hours as needed for  wheezing or shortness of breath. 90 mL 2   albuterol (VENTOLIN HFA) 108 (90 Base) MCG/ACT inhaler Inhale 2 puffs into the lungs every 6 (six) hours as needed for wheezing or shortness of breath. 18 g 1   baclofen (LIORESAL) 10 MG tablet Take 1 tablet (10 mg total) by mouth 2 (two) times daily. 60 each 5   budesonide-formoterol (SYMBICORT) 80-4.5 MCG/ACT inhaler Inhale 2 puffs into the lungs 2 (two) times daily 10.2 g 10   feeding supplement (ENSURE ENLIVE / ENSURE PLUS) LIQD Take 237 mLs by mouth 3 (three) times daily between meals. 237 mL 12   folic acid (FOLVITE) 1 MG tablet Take 1 tablet (1 mg total) by mouth daily. 100 tablet 1   magnesium  oxide (MAG-OX) 400 (240 Mg) MG tablet Take 1 tablet (400 mg total) by mouth daily. 100 tablet 0   meloxicam (MOBIC) 7.5 MG tablet Take 1 tablet (7.5 mg total) by mouth daily. Prn pain 30 tablet 2   metoprolol tartrate (LOPRESSOR) 50 MG tablet Take 1 tablet (50 mg total) by mouth 2 (two) times daily. 180 tablet 1   Multiple Vitamin (MULTIVITAMIN WITH MINERALS) TABS tablet Take 1 tablet by mouth daily. 100 tablet 1   pantoprazole (PROTONIX) 40 MG tablet Take 1 tablet (40 mg total) by mouth daily.     polyethylene glycol (MIRALAX / GLYCOLAX) 17 g packet Take 17 g by mouth daily. 14 each 0   QUEtiapine (SEROQUEL) 50 MG tablet Take 1 tablet (50 mg total) by mouth 2 (two) times daily.     senna-docusate (SENOKOT-S) 8.6-50 MG tablet Take 2 tablets by mouth 2 (two) times daily.     thiamine (VITAMIN B-1) 100 MG tablet Take 1 tablet (100 mg total) by mouth daily.     No current facility-administered medications on file prior to visit.    No Known Allergies  Social History   Socioeconomic History   Marital status: Single    Spouse name: Not on file   Number of children: Not on file   Years of education: Not on file   Highest education level: Not on file  Occupational History   Not on file  Tobacco Use   Smoking status: Every Day    Current packs/day: 1.00    Average packs/day: 1 pack/day for 47.0 years (47.0 ttl pk-yrs)    Types: Cigarettes    Start date: 07/08/2015   Smokeless tobacco: Never   Tobacco comments:    a pack in a couple of days  Vaping Use   Vaping status: Never Used  Substance and Sexual Activity   Alcohol use: Yes    Alcohol/week: 42.0 standard drinks of alcohol    Types: 42 Cans of beer per week    Comment: daily   Drug use: No   Sexual activity: Not Currently  Other Topics Concern   Not on file  Social History Narrative   Not on file   Social Determinants of Health   Financial Resource Strain: Not on file  Food Insecurity: No Food Insecurity (11/18/2021)    Hunger Vital Sign    Worried About Running Out of Food in the Last Year: Never true    Ran Out of Food in the Last Year: Never true  Transportation Needs: No Transportation Needs (11/18/2021)   PRAPARE - Administrator, Civil Service (Medical): No    Lack of Transportation (Non-Medical): No  Physical Activity: Not on file  Stress: Not on file  Social  Connections: Not on file  Intimate Partner Violence: Not At Risk (11/18/2021)   Humiliation, Afraid, Rape, and Kick questionnaire    Fear of Current or Ex-Partner: No    Emotionally Abused: No    Physically Abused: No    Sexually Abused: No    Family History  Problem Relation Age of Onset   Heart attack Mother    Cirrhosis Father    Stroke Sister    Heart attack Brother     Past Surgical History:  Procedure Laterality Date   arm surgery     DECORTICATION Right 06/03/2015   Procedure: DECORTICATION;  Surgeon: [name], MD;  [facility];  Service: Thoracic;  Laterality: Right;   VIDEO ASSISTED THORACOSCOPY (VATS)/EMPYEMA Right 06/03/2015   Procedure: VIDEO ASSISTED THORACOSCOPY (VATS)/EMPYEMA;  Surgeon: [name], MD;  Location: [facility] OR;  Service: Thoracic;  Laterality: Right;    ROS: Review of Systems Negative except as stated above  PHYSICAL EXAM: BP 110/68 (BP Location: Left Arm, Patient Position: Sitting, Cuff Size: Normal)   Pulse (!) 52   Temp 97.8 F (36.6 C) (Oral)   Ht 5\' 9"  (1.753 m)   Wt 127 lb (57.6 kg)   SpO2 100%   BMI 18.75 kg/m   Wt Readings from Last 3 Encounters:  10/15/22 127 lb (57.6 kg)  08/28/22 126 lb 6.4 oz (57.3 kg)  06/07/22 133 lb (60.3 kg)    Physical Exam   General appearance -older Caucasian male in NAD.  He appears a bit underweight for height.  Clothing clean.   Mental status - normal mood, behavior, speech, dress, motor activity, and thought processes Mouth - mucous membranes moist, pharynx normal without lesions Neck - supple, no significant  adenopathy Chest -breath sounds are clear without wheezes or crackles. Heart - normal rate, regular rhythm, normal S1, S2, no murmurs, rubs, clicks or gallops Extremities -no lower extremity edema. Neuro: Gait is wide-based and slow.  He ambulates without assistive device.  Grip 5/5 on the left, 4/5 on the right.  Power proximally and distally upper extremities 5/5 on the left, 4+/5 on the right.  Power in the lower extremities 5/5 bilaterally.     Latest Ref Rng & Units 05/29/2022    1:29 PM 01/30/2022   10:19 AM 12/16/2021    3:10 AM  CMP  Glucose 70 - 99 mg/dL 161  91  096   BUN 8 - 23 mg/dL 11  10  16    Creatinine 0.61 - 1.24 mg/dL 0.45  4.09  8.11   Sodium 135 - 145 mmol/L 134  142  135   Potassium 3.5 - 5.1 mmol/L 3.8  5.1  4.0   Chloride 98 - 111 mmol/L 101  102  102   CO2 22 - 32 mmol/L 20  21  21    Calcium 8.9 - 10.3 mg/dL 9.4  91.4  9.3   Total Protein 6.5 - 8.1 g/dL 7.8  7.8  6.8   Total Bilirubin 0.3 - 1.2 mg/dL 0.8  0.4  0.3   Alkaline Phos 38 - 126 U/L 80  122  147   AST 15 - 41 U/L 24  16  39   ALT 0 - 44 U/L 19  18  48    Lipid Panel     Component Value Date/Time   CHOL 160 11/19/2021 0231   CHOL 153 10/10/2020 1650   TRIG 48 11/19/2021 0231   HDL 88 11/19/2021 0231   HDL 75 10/10/2020  1650   CHOLHDL 1.8 11/19/2021 0231   VLDL 10 11/19/2021 0231   LDLCALC 62 11/19/2021 0231   LDLCALC 62 10/10/2020 1650    CBC    Component Value Date/Time   WBC 7.7 05/29/2022 1329   RBC 3.87 (L) 05/29/2022 1329   HGB 12.2 (L) 05/29/2022 1329   HGB 13.1 10/10/2020 1650   HCT 35.7 (L) 05/29/2022 1329   HCT 37.2 (L) 10/10/2020 1650   PLT 247 05/29/2022 1329   PLT 192 10/10/2020 1650   MCV 92.2 05/29/2022 1329   MCV 100 (H) 10/10/2020 1650   MCH 31.5 05/29/2022 1329   MCHC 34.2 05/29/2022 1329   RDW 13.4 05/29/2022 1329   RDW 12.8 10/10/2020 1650   LYMPHSABS 2.5 05/29/2022 1329   MONOABS 0.6 05/29/2022 1329   EOSABS 0.1 05/29/2022 1329   BASOSABS 0.1 05/29/2022 1329     ASSESSMENT AND PLAN: 1. Essential hypertension At goal.  Continue metoprolol 50 mg twice a day.  Slightly bradycardic today but asymptomatic. - CBC - Comprehensive metabolic panel  2. Tobacco abuse Strongly advised him to quit smoking.  3. Pulmonary emphysema, unspecified emphysema type (HCC) Stable.  He uses a Symbicort and albuterol as needed  4. Lung nodule, multiple He is agreeable for me to resubmit the referral for the CAT scan of his chest - CT Chest Wo Contrast; Future  5. Screening for lung cancer See #4 above.  He has greater than 20 pack years of smoking.  Strongly advised to quit. - CT Chest Wo Contrast; Future  6. Bilateral impacted cerumen - Ambulatory referral to ENT  7. History of hemorrhagic cerebrovascular accident (CVA) with residual deficit - Lipid panel  8. Chronic anemia Due to B12 deficiency.  New prescription sent for B12 supplement to our pharmacy. - Iron, TIBC and Ferritin Panel - Vitamin B12 - cyanocobalamin 1000 MCG tablet; Take 1 tablet (1,000 mcg total) by mouth daily.  Dispense: 90 tablet; Refill: 1  9. Vitamin B12 deficiency See #8 above.  10. Need for shingles vaccine Given today.    Patient was given the opportunity to ask questions.  Patient verbalized understanding of the plan and was able to repeat key elements of the plan.   This documentation was completed using Paediatric nurse.  Any transcriptional errors are unintentional.  Orders Placed This Encounter  Procedures   CT Chest Wo Contrast   Varicella-zoster vaccine IM   CBC   Comprehensive metabolic panel   Lipid panel   Iron, TIBC and Ferritin Panel   Vitamin B12   Ambulatory referral to ENT     Requested Prescriptions   Signed Prescriptions Disp Refills   cyanocobalamin 1000 MCG tablet 90 tablet 1    Sig: Take 1 tablet (1,000 mcg total) by mouth daily.    Return in about 4 months (around 02/15/2023).  Jonah Blue, MD, FACP

## 2022-10-16 LAB — CBC
Hematocrit: 42.7 % (ref 37.5–51.0)
Hemoglobin: 13.9 g/dL (ref 13.0–17.7)
MCH: 32.3 pg (ref 26.6–33.0)
MCHC: 32.6 g/dL (ref 31.5–35.7)
MCV: 99 fL — ABNORMAL HIGH (ref 79–97)
Platelets: 296 10*3/uL (ref 150–450)
RBC: 4.31 x10E6/uL (ref 4.14–5.80)
RDW: 13 % (ref 11.6–15.4)
WBC: 7.4 10*3/uL (ref 3.4–10.8)

## 2022-10-16 LAB — LIPID PANEL
Chol/HDL Ratio: 4.1 {ratio} (ref 0.0–5.0)
Cholesterol, Total: 152 mg/dL (ref 100–199)
HDL: 37 mg/dL — ABNORMAL LOW (ref >39)
LDL Chol Calc (NIH): 94 mg/dL (ref 0–99)
Triglycerides: 117 mg/dL (ref 0–149)
VLDL Cholesterol Cal: 21 mg/dL (ref 5–40)

## 2022-10-16 LAB — COMPREHENSIVE METABOLIC PANEL
ALT: 17 [IU]/L (ref 0–44)
AST: 20 [IU]/L (ref 0–40)
Albumin: 4.5 g/dL (ref 3.9–4.9)
Alkaline Phosphatase: 115 [IU]/L (ref 44–121)
BUN/Creatinine Ratio: 11 (ref 10–24)
BUN: 11 mg/dL (ref 8–27)
Bilirubin Total: 0.3 mg/dL (ref 0.0–1.2)
CO2: 20 mmol/L (ref 20–29)
Calcium: 9.8 mg/dL (ref 8.6–10.2)
Chloride: 100 mmol/L (ref 96–106)
Creatinine, Ser: 1 mg/dL (ref 0.76–1.27)
Globulin, Total: 3 g/dL (ref 1.5–4.5)
Glucose: 92 mg/dL (ref 70–99)
Potassium: 5.2 mmol/L (ref 3.5–5.2)
Sodium: 135 mmol/L (ref 134–144)
Total Protein: 7.5 g/dL (ref 6.0–8.5)
eGFR: 85 mL/min/{1.73_m2} (ref >59)

## 2022-10-16 LAB — IRON,TIBC AND FERRITIN PANEL
Ferritin: 309 ng/mL (ref 30–400)
Iron Saturation: 50 % (ref 15–55)
Iron: 153 ug/dL (ref 38–169)
Total Iron Binding Capacity: 308 ug/dL (ref 250–450)
UIBC: 155 ug/dL (ref 111–343)

## 2022-10-16 LAB — VITAMIN B12: Vitamin B-12: 1638 pg/mL — ABNORMAL HIGH (ref 232–1245)

## 2022-10-24 ENCOUNTER — Other Ambulatory Visit: Payer: Self-pay

## 2022-10-31 ENCOUNTER — Other Ambulatory Visit: Payer: Self-pay | Admitting: Internal Medicine

## 2022-10-31 DIAGNOSIS — F1091 Alcohol use, unspecified, in remission: Secondary | ICD-10-CM

## 2022-10-31 DIAGNOSIS — M720 Palmar fascial fibromatosis [Dupuytren]: Secondary | ICD-10-CM

## 2022-10-31 DIAGNOSIS — D649 Anemia, unspecified: Secondary | ICD-10-CM

## 2022-10-31 DIAGNOSIS — I10 Essential (primary) hypertension: Secondary | ICD-10-CM

## 2022-10-31 NOTE — Telephone Encounter (Signed)
Medication Refill - Medication:  metoprolol tartrate (LOPRESSOR) 50 MG tablet folic acid (FOLVITE) 1 MG tablet cyanocobalamin 1000 MCG tablet meloxicam (MOBIC) 7.5 MG tablet  naproxen sodium (ALEVE) 220 MG tablet  Has the patient contacted their pharmacy? No.  Preferred Pharmacy (with phone number or street name): [facility]  Phone: [phone] Fax: [phone]  Has the patient been seen for an appointment in the last year OR does the patient have an upcoming appointment? Yes.    Agent: Please be advised that RX refills may take up to 3 business days. We ask that you follow-up with your pharmacy.

## 2022-11-01 ENCOUNTER — Encounter (INDEPENDENT_AMBULATORY_CARE_PROVIDER_SITE_OTHER): Payer: Self-pay | Admitting: Otolaryngology

## 2022-11-02 ENCOUNTER — Other Ambulatory Visit: Payer: Self-pay

## 2022-11-02 MED ORDER — MELOXICAM 7.5 MG PO TABS
7.5000 mg | ORAL_TABLET | Freq: Every day | ORAL | 2 refills | Status: DC
Start: 2022-11-02 — End: 2023-01-29
  Filled 2022-11-02: qty 30, 30d supply, fill #0
  Filled 2022-12-11: qty 30, 30d supply, fill #1
  Filled 2023-01-15: qty 30, 30d supply, fill #2

## 2022-11-02 MED ORDER — CYANOCOBALAMIN 1000 MCG PO TABS
1000.0000 ug | ORAL_TABLET | Freq: Every day | ORAL | 1 refills | Status: AC
Start: 2022-11-02 — End: ?
  Filled 2022-11-02: qty 90, 90d supply, fill #0
  Filled 2023-03-28: qty 130, 130d supply, fill #0
  Filled 2023-06-28: qty 130, 130d supply, fill #1

## 2022-11-02 MED ORDER — METOPROLOL TARTRATE 50 MG PO TABS
50.0000 mg | ORAL_TABLET | Freq: Two times a day (BID) | ORAL | 1 refills | Status: DC
Start: 2022-11-02 — End: 2023-02-18
  Filled 2022-11-02: qty 180, 90d supply, fill #0
  Filled 2023-01-18: qty 60, 30d supply, fill #1
  Filled 2023-01-29 – 2023-02-18 (×2): qty 60, 30d supply, fill #2

## 2022-11-02 MED ORDER — FOLIC ACID 1 MG PO TABS
1.0000 mg | ORAL_TABLET | Freq: Every day | ORAL | 1 refills | Status: DC
Start: 2022-11-02 — End: 2023-02-18
  Filled 2022-11-02: qty 30, 30d supply, fill #0
  Filled 2022-12-11: qty 30, 30d supply, fill #1
  Filled 2023-01-15: qty 30, 30d supply, fill #2
  Filled 2023-01-29 – 2023-02-18 (×2): qty 30, 30d supply, fill #3

## 2022-11-02 NOTE — Telephone Encounter (Signed)
Requested Prescriptions  Pending Prescriptions Disp Refills   metoprolol tartrate (LOPRESSOR) 50 MG tablet 180 tablet 1    Sig: Take 1 tablet (50 mg total) by mouth 2 (two) times daily.     Cardiovascular:  Beta Blockers Passed - 10/31/2022  3:58 PM      Passed - Last BP in normal range    BP Readings from Last 1 Encounters:  10/15/22 110/68         Passed - Last Heart Rate in normal range    Pulse Readings from Last 1 Encounters:  10/15/22 (!) 52         Passed - Valid encounter within last 6 months    Recent Outpatient Visits           2 weeks ago Essential hypertension   [place] [facility] Marcine Matar, MD   2 months ago Dupuytren's contracture   [facility] [place], [place], [place]   4 months ago Essential hypertension   [place] [facility] Marcine Matar, MD   1 year ago Bilateral impacted cerumen   [place] [facility] [place], Marzella [place], [place]   1 year ago Essential hypertension   [place] [facility] Marcine Matar, MD       Future Appointments             In 3 months Marcine Matar, MD [place] [facility]             folic acid (FOLVITE) 1 MG tablet 100 tablet 1    Sig: Take 1 tablet (1 mg total) by mouth daily.     Endocrinology:  Vitamins Passed - 10/31/2022  3:58 PM      Passed - Valid encounter within last 12 months    Recent Outpatient Visits           2 weeks ago Essential hypertension   [place] [facility] Marcine Matar, MD   2 months ago Dupuytren's contracture   [facility] [place], Marylene Land [place], [place]   4 months ago Essential hypertension   [place] [facility] Marcine Matar, MD   1 year ago Bilateral impacted cerumen   [place] [facility]  [place], [place]zella [place], [place]   1 year ago Essential hypertension   [place] [facility] Marcine Matar, MD       Future Appointments             In 3 months Marcine Matar, MD [place] [facility]             cyanocobalamin 1000 MCG tablet 90 tablet 1    Sig: Take 1 tablet (1,000 mcg total) by mouth daily.     Endocrinology:  Vitamins - Vitamin B12 Failed - 10/31/2022  3:58 PM      Failed - B12 Level in normal range and within 360 days    Vitamin B-12  Date Value Ref Range Status  10/15/2022 1,638 (H) 232 - 1,245 pg/mL Final         Passed - HCT in normal range and within 360 days    Hematocrit  Date Value Ref Range Status  10/15/2022 42.7 37.5 - 51.0 % Final  Passed - HGB in normal range and within 360 days    Hemoglobin  Date Value Ref Range Status  10/15/2022 13.9 13.0 - 17.7 g/dL Final         Passed - Valid encounter within last 12 months    Recent Outpatient Visits           2 weeks ago Essential hypertension   [place] [facility] Marcine Matar, MD   2 months ago Dupuytren's contracture   [facility] [place], Marylene Land [place], [place]   4 months ago Essential hypertension   [place] [facility] Marcine Matar, MD   1 year ago Bilateral impacted cerumen   [place] [facility] [place], [place]zella [place], [place]   1 year ago Essential hypertension   [place] [facility] Marcine Matar, MD       Future Appointments             In 3 months Marcine Matar, MD  [facility]             meloxicam (MOBIC) 7.5 MG tablet 30 tablet 2    Sig: Take 1 tablet (7.5 mg total) by mouth daily. Prn pain     Analgesics:  COX2 Inhibitors Failed - 10/31/2022  3:58 PM      Failed - Manual Review: Labs are only required if the patient has  taken medication for more than 8 weeks.      Passed - HGB in normal range and within 360 days    Hemoglobin  Date Value Ref Range Status  10/15/2022 13.9 13.0 - 17.7 g/dL Final         Passed - Cr in normal range and within 360 days    Creat  Date Value Ref Range Status  02/07/2016 0.86 0.70 - 1.33 mg/dL Final    Comment:      For patients > or = 63 years of age: The upper reference limit for Creatinine is approximately 13% higher for people identified as African-American.      Creatinine, Ser  Date Value Ref Range Status  10/15/2022 1.00 0.76 - 1.27 mg/dL Final         Passed - HCT in normal range and within 360 days    Hematocrit  Date Value Ref Range Status  10/15/2022 42.7 37.5 - 51.0 % Final         Passed - AST in normal range and within 360 days    AST  Date Value Ref Range Status  10/15/2022 20 0 - 40 IU/L Final         Passed - ALT in normal range and within 360 days    ALT  Date Value Ref Range Status  10/15/2022 17 0 - 44 IU/L Final         Passed - eGFR is 30 or above and within 360 days    GFR, Est African American  Date Value Ref Range Status  06/20/2015 >89 >=60 mL/min Final   GFR calc Af Amer  Date Value Ref Range Status  10/26/2019 117 >59 mL/min/1.73 Final    Comment:    **In accordance with recommendations from the NKF-ASN Task force,**   [facility] is in the process of updating its eGFR calculation to the   2021 CKD-EPI creatinine equation that estimates kidney function   without a race variable.  GFR, Est Non African American  Date Value Ref Range Status  06/20/2015 >89 >=60 mL/min Final   GFR, Estimated  Date Value Ref Range Status  05/29/2022 >60 >60 mL/min Final    Comment:    (NOTE) Calculated using the CKD-EPI Creatinine Equation (2021)    eGFR  Date Value Ref Range Status  10/15/2022 85 >59 mL/min/1.73 Final         Passed - Patient is not pregnant      Passed - Valid encounter within last 12 months    Recent  Outpatient Visits           2 weeks ago Essential hypertension   [place] [facility] Marcine Matar, MD   2 months ago Dupuytren's contracture   [facility] [place], [place], [place]   4 months ago Essential hypertension   [place] [facility] Marcine Matar, MD   1 year ago Bilateral impacted cerumen   [place] [facility] [place], [place], [place]   1 year ago Essential hypertension   [place] [facility] Marcine Matar, MD       Future Appointments             In 3 months Marcine Matar, MD [place] [facility]             naproxen sodium (ALEVE) 220 MG tablet      Sig: Take 1 tablet (220 mg total) by mouth 2 (two) times daily with a meal.     Analgesics:  NSAIDS Failed - 10/31/2022  3:58 PM      Failed - Manual Review: Labs are only required if the patient has taken medication for more than 8 weeks.      Passed - Cr in normal range and within 360 days    Creat  Date Value Ref Range Status  02/07/2016 0.86 0.70 - 1.33 mg/dL Final    Comment:      For patients > or = 63 years of age: The upper reference limit for Creatinine is approximately 13% higher for people identified as African-American.      Creatinine, Ser  Date Value Ref Range Status  10/15/2022 1.00 0.76 - 1.27 mg/dL Final         Passed - HGB in normal range and within 360 days    Hemoglobin  Date Value Ref Range Status  10/15/2022 13.9 13.0 - 17.7 g/dL Final         Passed - PLT in normal range and within 360 days    Platelets  Date Value Ref Range Status  10/15/2022 296 150 - 450 x10E3/uL Final         Passed - HCT in normal range and within 360 days    Hematocrit  Date Value Ref Range Status  10/15/2022 42.7 37.5 - 51.0 % Final         Passed - eGFR is 30 or above and within 360 days    GFR, Est African  American  Date Value Ref Range Status  06/20/2015 >89 >=60 mL/min Final   GFR calc Af Amer  Date Value Ref Range Status  10/26/2019 117 >59 mL/min/1.73 Final    Comment:    **In accordance with recommendations from the NKF-ASN Task force,**   [facility] is in the process of updating its eGFR calculation to the  2021 CKD-EPI creatinine equation that estimates kidney function   without a race variable.    GFR, Est Non African American  Date Value Ref Range Status  06/20/2015 >89 >=60 mL/min Final   GFR, Estimated  Date Value Ref Range Status  05/29/2022 >60 >60 mL/min Final    Comment:    (NOTE) Calculated using the CKD-EPI Creatinine Equation (2021)    eGFR  Date Value Ref Range Status  10/15/2022 85 >59 mL/min/1.73 Final         Passed - Patient is not pregnant      Passed - Valid encounter within last 12 months    Recent Outpatient Visits           2 weeks ago Essential hypertension   [place] [facility] Marc[facility] Matar, MD   2 months ago Dupuytren's contracture   [facility] [place], [place], [place]   4 months ago Essential hypertension   [place] [facility] Marcine Matar, MD   1 year ago Bilateral impacted cerumen   [facility] [place], [place], [place]   1 year ago Essential hypertension   [place] [facility] Marcine Matar, MD       Future Appointments             In 3 months Laural Benes, Binnie Rail, MD [facility]

## 2022-11-02 NOTE — Telephone Encounter (Signed)
Requested medication (s) are due for refill today:   Requested medication (s) are on the active medication list: Yes  Last refill:    Future visit scheduled: Yes  Notes to clinic:  Cyanocobalamin - refilled 10/15/22  Meloxicam - manual review   Naproxen - D/C.    Requested Prescriptions  Pending Prescriptions Disp Refills   cyanocobalamin 1000 MCG tablet 90 tablet 1    Sig: Take 1 tablet (1,000 mcg total) by mouth daily.     Endocrinology:  Vitamins - Vitamin B12 Failed - 10/31/2022  3:58 PM      Failed - B12 Level in normal range and within 360 days    Vitamin B-12  Date Value Ref Range Status  10/15/2022 1,638 (H) 232 - 1,245 pg/mL Final         Passed - HCT in normal range and within 360 days    Hematocrit  Date Value Ref Range Status  10/15/2022 42.7 37.5 - 51.0 % Final         Passed - HGB in normal range and within 360 days    Hemoglobin  Date Value Ref Range Status  10/15/2022 13.9 13.0 - 17.7 g/dL Final         Passed - Valid encounter within last 12 months    Recent Outpatient Visits           2 weeks ago Essential hypertension   [facility] Marcine Matar, MD   2 months ago Dupuytren's contracture   [facility] [place], [place], [place]   4 months ago Essential hypertension   [facility] Marcine Matar, MD   1 year ago Bilateral impacted cerumen   [facility] [place], Marzella Schlein, [place]   1 year ago Essential hypertension   [facility] Marcine Matar, MD       Future Appointments             In 3 months Marcine Matar, MD [facility]             meloxicam (MOBIC) 7.5 MG tablet 30 tablet 2    Sig: Take 1 tablet (7.5 mg total) by mouth daily. Prn pain     Analgesics:  COX2 Inhibitors Failed - 10/31/2022  3:58 PM      Failed -  Manual Review: Labs are only required if the patient has taken medication for more than 8 weeks.      Passed - HGB in normal range and within 360 days    Hemoglobin  Date Value Ref Range Status  10/15/2022 13.9 13.0 - 17.7 g/dL Final         Passed - Cr in normal range and within 360 days    Creat  Date Value Ref Range Status  02/07/2016 0.86 0.70 - 1.33 mg/dL Final    Comment:      For patients > or = 63 years of age: The upper reference limit for Creatinine is approximately 13% higher for people identified as African-American.      Creatinine, Ser  Date Value Ref Range Status  10/15/2022 1.00 0.76 - 1.27 mg/dL Final         Passed - HCT in normal range and within 360 days    Hematocrit  Date Value Ref Range Status  10/15/2022 42.7 37.5 -  51.0 % Final         Passed - AST in normal range and within 360 days    AST  Date Value Ref Range Status  10/15/2022 20 0 - 40 IU/L Final         Passed - ALT in normal range and within 360 days    ALT  Date Value Ref Range Status  10/15/2022 17 0 - 44 IU/L Final         Passed - eGFR is 30 or above and within 360 days    GFR, Est African American  Date Value Ref Range Status  06/20/2015 >89 >=60 mL/min Final   GFR calc Af Amer  Date Value Ref Range Status  10/26/2019 117 >59 mL/min/1.73 Final    Comment:    **In accordance with recommendations from the NKF-ASN Task force,**   [facility] is in the process of updating its eGFR calculation to the   2021 CKD-EPI creatinine equation that estimates kidney function   without a race variable.    GFR, Est Non African American  Date Value Ref Range Status  06/20/2015 >89 >=60 mL/min Final   GFR, Estimated  Date Value Ref Range Status  05/29/2022 >60 >60 mL/min Final    Comment:    (NOTE) Calculated using the CKD-EPI Creatinine Equation (2021)    eGFR  Date Value Ref Range Status  10/15/2022 85 >59 mL/min/1.73 Final         Passed - Patient is not pregnant       Passed - Valid encounter within last 12 months    Recent Outpatient Visits           2 weeks ago Essential hypertension   [facility] Marcine Matar, MD   2 months ago Dupuytren's contracture   [facility] [place], [place], [place]   4 months ago Essential hypertension   [place] [facility] Marcine Matar, MD   1 year ago Bilateral impacted cerumen   [place] [place], [place]ella [place], [place]   1 year ago Essential hypertension   [place] [facility] Marcine Matar, MD       Future Appointments             In 3 months Marcine Matar, MD [facility]             naproxen sodium (ALEVE) 220 MG tablet      Sig: Take 1 tablet (220 mg total) by mouth 2 (two) times daily with a meal.     Analgesics:  NSAIDS Failed - 10/31/2022  3:58 PM      Failed - Manual Review: Labs are only required if the patient has taken medication for more than 8 weeks.      Passed - Cr in normal range and within 360 days    Creat  Date Value Ref Range Status  02/07/2016 0.86 0.70 - 1.33 mg/dL Final    Comment:      For patients > or = 63 years of age: The upper reference limit for Creatinine is approximately 13% higher for people identified as African-American.      Creatinine, Ser  Date Value Ref Range Status  10/15/2022 1.00 0.76 - 1.27 mg/dL Final         Passed - HGB in normal range and within  360 days    Hemoglobin  Date Value Ref Range Status  10/15/2022 13.9 13.0 - 17.7 g/dL Final         Passed - PLT in normal range and within 360 days    Platelets  Date Value Ref Range Status  10/15/2022 296 150 - 450 x10E3/uL Final         Passed - HCT in normal range and within 360 days    Hematocrit  Date Value Ref Range Status  10/15/2022 42.7 37.5 - 51.0 % Final         Passed - eGFR is  30 or above and within 360 days    GFR, Est African American  Date Value Ref Range Status  06/20/2015 >89 >=60 mL/min Final   GFR calc Af Amer  Date Value Ref Range Status  10/26/2019 117 >59 mL/min/1.73 Final    Comment:    **In accordance with recommendations from the NKF-ASN Task force,**   [facility] is in the process of updating its eGFR calculation to the   2021 CKD-EPI creatinine equation that estimates kidney function   without a race variable.    GFR, Est Non African American  Date Value Ref Range Status  06/20/2015 >89 >=60 mL/min Final   GFR, Estimated  Date Value Ref Range Status  05/29/2022 >60 >60 mL/min Final    Comment:    (NOTE) Calculated using the CKD-EPI Creatinine Equation (2021)    eGFR  Date Value Ref Range Status  10/15/2022 85 >59 mL/min/1.73 Final         Passed - Patient is not pregnant      Passed - Valid encounter within last 12 months    Recent Outpatient Visits           2 weeks ago Essential hypertension   [place] [facility] Marcine Matar, MD   2 months ago Dupuytren's contracture   [facility] [place], [place], [place]   4 months ago Essential hypertension   [facility] Marcine Matar, MD   1 year ago Bilateral impacted cerumen   [place] [place], [place]zella [place], [place]   1 year ago Essential hypertension   [place] [facility] Marcine Matar, MD       Future Appointments             In 3 months Marcine Matar, MD [facility]            Signed Prescriptions Disp Refills   metoprolol tartrate (LOPRESSOR) 50 MG tablet 180 tablet 1    Sig: Take 1 tablet (50 mg total) by mouth 2 (two) times daily.     Cardiovascular:  Beta Blockers Passed - 10/31/2022  3:58 PM      Passed - Last BP in normal range    BP Readings from Last 1  Encounters:  10/15/22 110/68         Passed - Last Heart Rate in normal range    Pulse Readings from Last 1 Encounters:  10/15/22 (!) 52         Passed - Valid encounter within last 6 months    Recent Outpatient Visits           2 weeks ago Essential hypertension    [facility], MD  2 months ago Dupuytren's contracture   [facility] [place], [place], [place]   4 months ago Essential hypertension   [facility] Matar, MD   1 year ago Bilateral impacted cerumen   [facility] [place], [place], [place]   1 year ago Essential hypertension   [place] [facility] Marcine Matar, MD       Future Appointments             In 3 months Marcine Matar, MD [facility]             folic acid (FOLVITE) 1 MG tablet 100 tablet 1    Sig: Take 1 tablet (1 mg total) by mouth daily.     Endocrinology:  Vitamins Passed - 10/31/2022  3:58 PM      Passed - Valid encounter within last 12 months    Recent Outpatient Visits           2 weeks ago Essential hypertension   [place]ine Matar, MD   2 months ago Dupuytren's contracture   [facility] [place], [place], [place]   4 months ago Essential hypertension   [facility] Marcine Matar, MD   1 year ago Bilateral impacted cerumen   [facility] [place], [place], [place]   1 year ago Essential hypertension   [facility] Marc[facility] Matar, MD       Future Appointments             In 3 months Laural Benes, Binnie Rail, MD [facility]

## 2022-11-06 ENCOUNTER — Other Ambulatory Visit: Payer: Self-pay

## 2022-11-09 ENCOUNTER — Ambulatory Visit (INDEPENDENT_AMBULATORY_CARE_PROVIDER_SITE_OTHER): Payer: MEDICAID

## 2022-11-09 ENCOUNTER — Ambulatory Visit (INDEPENDENT_AMBULATORY_CARE_PROVIDER_SITE_OTHER): Payer: MEDICAID | Admitting: Audiology

## 2022-11-09 DIAGNOSIS — H6123 Impacted cerumen, bilateral: Secondary | ICD-10-CM | POA: Diagnosis not present

## 2022-11-09 DIAGNOSIS — H9193 Unspecified hearing loss, bilateral: Secondary | ICD-10-CM

## 2022-11-09 DIAGNOSIS — H90A32 Mixed conductive and sensorineural hearing loss, unilateral, left ear with restricted hearing on the contralateral side: Secondary | ICD-10-CM | POA: Diagnosis not present

## 2022-11-09 NOTE — Progress Notes (Signed)
  [address], [address] [place], [place] [address] [phone]  Audiological Evaluation    Name: Brent Warren     DOB:   07/27/1960      MRN:   [id number]                                                                                     Service Date: 11/09/2022     Accompanied by: brother   Patient comes today after [name], PA-C sent a referral for a hearing evaluation due to concerns with hearing loss.   Symptoms Yes Details  Hearing loss  [x]  Left sided hearing and echo sensation  Tinnitus  []    Ear pain/ Ear infections  []    Balance problems  []    Noise exposure  []    Previous ear surgeries  []    Family history  []    Amplification  []    Other  []      Otoscopy: Right ear: Clear external ear canals and notable landmarks visualized on the tympanic membrane. Left ear:  Clear external ear canals and notable landmarks visualized on the tympanic membrane.  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  The hearing test results were completed under headphones and re-checked with inserts and results are deemed to be of good reliability. Test technique:  conventional     Speech Audiometry: Right ear- Speech Reception Threshold (SRT) was obtained at 30 dBHL Left ear-Speech Reception Threshold (SRT) was obtained at 50 dBHL   Word Recognition Score Tested using NU-6 (MLV) Right ear: 80% was obtained at a presentation level of 75 dBHL with contralateral masking which is deemed as  good . Left ear: 72% was obtained at a presentation level of 90 dBHL with contralateral masking  which is deemed as  fair.    Impression: There is a significant difference in pure-tone thresholds between ears, worse mainly for air -conduction in the left ear. There is not a significant difference in the word recognition score in between ears.    Recommendations: Follow up with ENT as scheduled for today. Return for a hearing evaluation if concerns with hearing changes arise or per MD recommendation. Repeat audiogram after medical care.   Camylle Whicker MARIE LEROUX-MARTINEZ, AUD

## 2022-11-09 NOTE — Patient Instructions (Addendum)
Please [place] Debrox 5-10 drops in each ear twice daily for two weeks, then once daily for two more weeks. Please call with any questions or concerns.

## 2022-11-09 NOTE — Progress Notes (Unsigned)
Dear Dr. Laural Benes,  Here is my assessment for our mutual patient, Brent Warren. Thank you for allowing me the opportunity to care for your patient. Please do not hesitate to contact me should you have any other questions. Sincerely, Burna Forts PA-C  Otolaryngology Clinic Note Referring provider: Dr. Laural Benes HPI:  Brent Warren is a 62 y.o. Warren kindly referred by Dr. Laural Benes for evaluation of bilateral cerumen impaction.   Brent Warren that he has had issues with earwax since he was a kid. He reports having his ears clogged regularly and was told this was from ear wax. He is retired but worked his entire adult life in [employer] where he was exposed to constant loud noises. He reports a gradual loss in hearing over time but Warren that recently it has become worse and friends and family remark about his difficulty hearing. He reports the hearing is better in the right ear compared to the left. He has been using q tips in his ears. He denies head/neck trauma, recent or chronic ear infections, pain, drainage, sinus disease or symptoms.  No history of cancer.    Tobacco:   .5 pack per day 50 years ( 25 pack year history) . Alcohol: quit over a year. Occupation: retired from [employer] work.  Independent Review of Additional Tests or Records:   Office visit note from Dr. Laural Benes on 10/15/2022   PMH/Meds/All/SocHx/FamHx/ROS:   Past Medical History:  Diagnosis Date   Alcoholic (HCC)    Chest pain 04/02/2017   COPD (chronic obstructive pulmonary disease) (HCC)    Empyema of pleural space (HCC) 05/2015   ETOH abuse 11/06/2016   Hepatitis 05/22/2015   HTN (hypertension)    Immunization due 11/06/2016   Lung nodule, multiple 11/08/2015   Malnutrition of moderate degree 06/02/2015   Pneumonia 05/2015   Pulmonary emphysema (HCC) 11/06/2016   Tobacco abuse    Urge incontinence 11/06/2016     Past Surgical History:  Procedure Laterality Date   arm surgery     DECORTICATION Right 06/03/2015    Procedure: DECORTICATION;  Surgeon: [name], MD;  Location: [facility] OR;  Service: Thoracic;  Laterality: Right;   VIDEO ASSISTED THORACOSCOPY (VATS)/EMPYEMA Right 06/03/2015   Procedure: VIDEO ASSISTED THORACOSCOPY (VATS)/EMPYEMA;  Surgeon: [name], MD;  Location: [facility] OR;  Service: Thoracic;  Laterality: Right;    Family History  Problem Relation Age of Onset   Heart attack Mother    Cirrhosis Father    Stroke Sister    Heart attack Brother      Social Connections: Not on file      Current Outpatient Medications:    acetaminophen (TYLENOL) 325 MG tablet, Take 2 tablets (650 mg total) by mouth every 4 (four) hours as needed for mild pain (or temp > 37.5 C (99.5 F))., Disp: , Rfl:    albuterol (PROVENTIL) (2.5 MG/3ML) 0.083% nebulizer solution, Take 3 mLs (2.5 mg total) by nebulization every 6 (six) hours as needed for wheezing or shortness of breath., Disp: 90 mL, Rfl: 2   albuterol (VENTOLIN HFA) 108 (90 Base) MCG/ACT inhaler, Inhale 2 puffs into the lungs every 6 (six) hours as needed for wheezing or shortness of breath., Disp: 18 g, Rfl: 1   baclofen (LIORESAL) 10 MG tablet, Take 1 tablet (10 mg total) by mouth 2 (two) times daily., Disp: 60 each, Rfl: 5   budesonide-formoterol (SYMBICORT) 80-4.5 MCG/ACT inhaler, Inhale 2 puffs into the lungs 2 (two) times daily, Disp: 10.2 g, Rfl: 10  cyanocobalamin 1000 MCG tablet, Take 1 tablet (1,000 mcg total) by mouth daily., Disp: 90 tablet, Rfl: 1   feeding supplement (ENSURE ENLIVE / ENSURE PLUS) LIQD, Take 237 mLs by mouth 3 (three) times daily between meals., Disp: 237 mL, Rfl: 12   folic acid (FOLVITE) 1 MG tablet, Take 1 tablet (1 mg total) by mouth daily., Disp: 100 tablet, Rfl: 1   magnesium oxide (MAG-OX) 400 (240 Mg) MG tablet, Take 1 tablet (400 mg total) by mouth daily., Disp: 100 tablet, Rfl: 0   meloxicam (MOBIC) 7.5 MG tablet, Take 1 tablet (7.5 mg total) by mouth daily. Prn pain, Disp: 30 tablet, Rfl: 2    metoprolol tartrate (LOPRESSOR) 50 MG tablet, Take 1 tablet (50 mg total) by mouth 2 (two) times daily., Disp: 180 tablet, Rfl: 1   Multiple Vitamin (MULTIVITAMIN WITH MINERALS) TABS tablet, Take 1 tablet by mouth daily., Disp: 100 tablet, Rfl: 1   pantoprazole (PROTONIX) 40 MG tablet, Take 1 tablet (40 mg total) by mouth daily., Disp: , Rfl:    polyethylene glycol (MIRALAX / GLYCOLAX) 17 g packet, Take 17 g by mouth daily., Disp: 14 each, Rfl: 0   QUEtiapine (SEROQUEL) 50 MG tablet, Take 1 tablet (50 mg total) by mouth 2 (two) times daily., Disp: , Rfl:    senna-docusate (SENOKOT-S) 8.6-50 MG tablet, Take 2 tablets by mouth 2 (two) times daily., Disp: , Rfl:    thiamine (VITAMIN B-1) 100 MG tablet, Take 1 tablet (100 mg total) by mouth daily., Disp: , Rfl:    Physical Exam:   There were no vitals taken for this visit.  Pertinent Findings  CN II-XII intact Bilateral EAC with impacted cerumen  Weber 512: R>L Rinne 512: AC > BC b/l  Anterior rhinoscopy: Septum midline; bilateral inferior turbinates with no significant hypertrophy  No lesions of oral cavity/oropharynx; poor dentition with numerous missing teeth  No obviously palpable neck masses/lymphadenopathy/thyromegaly No respiratory distress or stridor  Seprately Identifiable Procedures:  Procedure: Bilateral ear microscopy and cerumen removal using microscope (CPT [phone]) - Mod 25 Pre-procedure diagnosis: Cerumen impaction bilateral external ears Post-procedure diagnosis: same Indication:  cerumen impaction; given patient's otologic complaints and history as well as for improved and comprehensive examination of external ear and tympanic membrane, bilateral otologic examination using microscope was performed and impacted cerumen removed  Procedure: Patient was placed semi-recumbent. Both ear canals were examined using the microscope with findings above. Cerumen removed on left and on right using suction and currette with improvement in  EAC examination and patency. Left: EAC was patent with some remaining cerumen along the inferior EC and TM. Visualized TM was intact . Middle ear was aerated. Drainage: none Right: EAC was patent. TM was intact . Middle ear was aerated . Drainage: none Patient tolerated the procedure well.    Impression & Plans:  Brent Warren is a 62 y.o. Warren with a history of cerumen impaction  Mr. Ramsaran presented today with bilateral cerumen impaction and hearing loss. I was able to remove a considerable amount of cerumen from both canals. The right EC was cleared and I was able to visualized the entire TM, no significant issues noted. I was able to remove a majority of the cerumen form the left EC but could not completely visualize the anterior inferior portion; likely some remaining cerumen. The patient did see audiology today. Will plan to have him use debrox drops for the next several weeks and follow up with me for re-evaluation.   Thank you  for allowing me the opportunity to care for your patient. Please do not hesitate to contact me should you have any other questions.  Sincerely, Burna Forts PA-C  [facility] Phone: [phone] Fax: [phone]  11/09/2022, 12:46 PM

## 2022-12-10 ENCOUNTER — Ambulatory Visit (INDEPENDENT_AMBULATORY_CARE_PROVIDER_SITE_OTHER): Payer: MEDICAID

## 2022-12-11 ENCOUNTER — Other Ambulatory Visit: Payer: Self-pay

## 2022-12-11 ENCOUNTER — Other Ambulatory Visit: Payer: Self-pay | Admitting: Internal Medicine

## 2022-12-11 DIAGNOSIS — J439 Emphysema, unspecified: Secondary | ICD-10-CM

## 2022-12-11 MED ORDER — ALBUTEROL SULFATE HFA 108 (90 BASE) MCG/ACT IN AERS
2.0000 | INHALATION_SPRAY | Freq: Four times a day (QID) | RESPIRATORY_TRACT | 1 refills | Status: DC | PRN
Start: 2022-12-11 — End: 2023-06-18
  Filled 2022-12-11: qty 18, 25d supply, fill #0
  Filled 2023-01-29 – 2023-03-28 (×2): qty 18, 25d supply, fill #1

## 2022-12-17 ENCOUNTER — Other Ambulatory Visit: Payer: Self-pay

## 2023-01-04 ENCOUNTER — Ambulatory Visit (INDEPENDENT_AMBULATORY_CARE_PROVIDER_SITE_OTHER): Payer: MEDICAID | Admitting: Physician Assistant

## 2023-01-04 ENCOUNTER — Encounter (INDEPENDENT_AMBULATORY_CARE_PROVIDER_SITE_OTHER): Payer: Self-pay | Admitting: Physician Assistant

## 2023-01-04 DIAGNOSIS — H9192 Unspecified hearing loss, left ear: Secondary | ICD-10-CM

## 2023-01-04 DIAGNOSIS — H6123 Impacted cerumen, bilateral: Secondary | ICD-10-CM | POA: Diagnosis not present

## 2023-01-04 NOTE — Progress Notes (Signed)
Dear Dr. Laural Benes, Here is my assessment for our mutual patient, Brent Warren. Thank you for allowing me the opportunity to care for your patient. Please do not hesitate to contact me should you have any other questions. Sincerely, Burna Forts PA-C  Otolaryngology Clinic Note Referring provider: Dr. Laural Benes HPI:  Brent Warren is a 62 y.o. male kindly referred by Dr. Laural Benes   The patient seen today for follow-up evaluation of cerumen impaction and hearing loss.  The patient was last seen in the office on 11/09/2022.  He was a recap of that visit  Update 01/04/2023  Since his last office visit he notes he has been doing well, and he denies any significant pain, pressure, drainage, or any other changes to his ear.  He denies any hearing changes.  He has been using over-the-counter earwax removal kit but has not been using it consistently and is not using any drops.  11/09/2022 Brent Warren notes that he has had issues with earwax since he was a kid. He reports having his ears clogged regularly and was told this was from ear wax. He is retired but worked his entire adult life in [employer] where he was exposed to constant loud noises. He reports a gradual loss in hearing over time but notes that recently it has become worse and friends and family remark about his difficulty hearing. He reports the hearing is better in the right ear compared to the left. He has been using q tips in his ears. He denies head/neck trauma, recent or chronic ear infections, pain, drainage, sinus disease or symptoms.  No history of cancer.      Tobacco:   .5 pack per day 50 years ( 25 pack year history) . Alcohol: quit over a year. Occupation: retired from [employer] work.   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 from 250 through 8000 Hz; 80% word interpretation  at 75 dB; type Ad tympanogram Left ear: Mild sloping to profound mixed hearing loss from 250 through 8000; 72% word interpretation at 90 dB; type a tympanogram  Mixed hearing loss   PMH/Meds/All/SocHx/FamHx/ROS:   Past Medical History:  Diagnosis Date   Alcoholic (HCC)    Chest pain 04/02/2017   COPD (chronic obstructive pulmonary disease) (HCC)    Empyema of pleural space (HCC) 05/2015   ETOH abuse 11/06/2016   Hepatitis 05/22/2015   HTN (hypertension)    Immunization due 11/06/2016   Lung nodule, multiple 11/08/2015   Malnutrition of moderate degree 06/02/2015   Pneumonia 05/2015   Pulmonary emphysema (HCC) 11/06/2016   Tobacco abuse    Urge incontinence 11/06/2016     Past Surgical History:  Procedure Laterality Date   arm surgery     DECORTICATION Right 06/03/2015   Procedure: DECORTICATION;  Surgeon: [name], MD;  Location: [facility] OR;  Service: Thoracic;  Laterality: Right;   VIDEO ASSISTED THORACOSCOPY (VATS)/EMPYEMA Right 06/03/2015   Procedure: VIDEO ASSISTED THORACOSCOPY (VATS)/EMPYEMA;  Surgeon: [name], MD;  Location: [facility] OR;  Service: Thoracic;  Laterality: Right;    Family History  Problem Relation Age of Onset   Heart attack Mother    Cirrhosis Father    Stroke Sister    Heart attack Brother      Social Connections: Not on file      Current Outpatient Medications:    acetaminophen (TYLENOL) 325 MG tablet,  Take 2 tablets (650 mg total) by mouth every 4 (four) hours as needed for mild pain (or temp > 37.5 C (99.5 F))., Disp: , Rfl:    albuterol (PROVENTIL) (2.5 MG/3ML) 0.083% nebulizer solution, Take 3 mLs (2.5 mg total) by nebulization every 6 (six) hours as needed for wheezing or shortness of breath., Disp: 90 mL, Rfl: 2   albuterol (VENTOLIN HFA) 108 (90 Base) MCG/ACT inhaler, Inhale 2 puffs into the lungs every 6 (six) hours as needed for wheezing or shortness of breath., Disp: 18 g, Rfl: 1   baclofen (LIORESAL) 10 MG tablet, Take 1  tablet (10 mg total) by mouth 2 (two) times daily., Disp: 60 each, Rfl: 5   budesonide-formoterol (SYMBICORT) 80-4.5 MCG/ACT inhaler, Inhale 2 puffs into the lungs 2 (two) times daily, Disp: 10.2 g, Rfl: 10   cyanocobalamin 1000 MCG tablet, Take 1 tablet (1,000 mcg total) by mouth daily., Disp: 90 tablet, Rfl: 1   feeding supplement (ENSURE ENLIVE / ENSURE PLUS) LIQD, Take 237 mLs by mouth 3 (three) times daily between meals., Disp: 237 mL, Rfl: 12   folic acid (FOLVITE) 1 MG tablet, Take 1 tablet (1 mg total) by mouth daily., Disp: 100 tablet, Rfl: 1   magnesium oxide (MAG-OX) 400 (240 Mg) MG tablet, Take 1 tablet (400 mg total) by mouth daily., Disp: 100 tablet, Rfl: 0   meloxicam (MOBIC) 7.5 MG tablet, Take 1 tablet (7.5 mg total) by mouth daily. Prn pain, Disp: 30 tablet, Rfl: 2   metoprolol tartrate (LOPRESSOR) 50 MG tablet, Take 1 tablet (50 mg total) by mouth 2 (two) times daily., Disp: 180 tablet, Rfl: 1   Multiple Vitamin (MULTIVITAMIN WITH MINERALS) TABS tablet, Take 1 tablet by mouth daily., Disp: 100 tablet, Rfl: 1   pantoprazole (PROTONIX) 40 MG tablet, Take 1 tablet (40 mg total) by mouth daily., Disp: , Rfl:    polyethylene glycol (MIRALAX / GLYCOLAX) 17 g packet, Take 17 g by mouth daily., Disp: 14 each, Rfl: 0   QUEtiapine (SEROQUEL) 50 MG tablet, Take 1 tablet (50 mg total) by mouth 2 (two) times daily., Disp: , Rfl:    senna-docusate (SENOKOT-S) 8.6-50 MG tablet, Take 2 tablets by mouth 2 (two) times daily., Disp: , Rfl:    thiamine (VITAMIN B-1) 100 MG tablet, Take 1 tablet (100 mg total) by mouth daily., Disp: , Rfl:    Physical Exam:   There were no vitals taken for this visit.  Pertinent Findings  CN II-XII intact Cerumen in bilateral external auditory canals; status post removal with cerumen sclerosis along bilateral tympanic membranes with well pneumatized middle ear Weber 512: equal Rinne 512: AC > BC b/l  No lesions of oral cavity/oropharynx; no visible tonsillar  tissue No obviously palpable neck masses/lymphadenopathy/thyromegaly No respiratory distress or stridor    Seprately Identifiable Procedures:  Procedure: Bilateral ear microscopy and cerumen removal using microscope (CPT 69210) - Mod 50 Pre-procedure diagnosis:Cerumen impaction bilateral external ears Post-procedure diagnosis: same Indication: cerumen impaction; given patient's otologic complaints and history as well as for improved and comprehensive examination of external ear and tympanic membrane, bilateral otologic examination using microscope was performed and impacted cerumen removed  Procedure: Patient was placed semi-recumbent. Both ear canals were examined using the microscope with findings above. Cerumen removed on left and on right using suction and currette with improvement in EAC examination and patency. Left: EAC was patent. TM was intact . Middle ear was aerated. Drainage: none Right: EAC was patent. TM was intact. Middle  ear was aerated. Drainage: none Patient tolerated the procedure well.        Impression & Plans:  Brent Warren is a 62 y.o. male with the following   Ceruminosis-  Cerumen was once again removed from bilateral external auditory canals.  The patient notes significant improvement in his left ear which had much more cerumen when compared to the right.  He notes his hearing is symmetric today.  I have advised him to avoid putting anything in his ears until I see him again.  Hearing loss-  The patient does have asymmetric hearing loss on his evaluation, there is an air-bone gap, have high suspicion this is secondary to cerumen in the left external auditory canal.  At his last visit he did have cerumen up against his tympanic membrane as well.  I was able to successfully remove all cerumen at today's visit.  I would recommend that he have a repeat audiogram to assure that his hearing has returned to baseline.  The patient will complete this within the next 1  to 2 months.  I would like to see him in the office for follow-up evaluation at the time of the audiogram.   - f/u 1 to 2 months with repeat audiogram     Thank you for allowing me the opportunity to care for your patient. Please do not hesitate to contact me should you have any other questions.  Sincerely, Burna Forts PA-C [facility] Phone: [phone] Fax: [phone]  01/04/2023, 2:59 PM

## 2023-01-10 ENCOUNTER — Encounter: Payer: Self-pay | Admitting: Neurology

## 2023-01-10 ENCOUNTER — Telehealth: Payer: Self-pay | Admitting: Neurology

## 2023-01-10 NOTE — Telephone Encounter (Signed)
 LVM and sent letter in mail informing pt of need to reschedule 02/14/23 appt - MD out

## 2023-01-15 ENCOUNTER — Other Ambulatory Visit: Payer: Self-pay

## 2023-01-18 ENCOUNTER — Other Ambulatory Visit: Payer: Self-pay

## 2023-01-21 ENCOUNTER — Telehealth (INDEPENDENT_AMBULATORY_CARE_PROVIDER_SITE_OTHER): Payer: Self-pay | Admitting: Physician Assistant

## 2023-01-21 NOTE — Telephone Encounter (Signed)
 Attempted to leave voicemail for patient to reschedule appointment with Trey Paula PA, voicemail box cut off and is full.

## 2023-01-29 ENCOUNTER — Other Ambulatory Visit: Payer: Self-pay | Admitting: Internal Medicine

## 2023-01-29 ENCOUNTER — Other Ambulatory Visit (HOSPITAL_COMMUNITY): Payer: Self-pay

## 2023-01-29 DIAGNOSIS — M720 Palmar fascial fibromatosis [Dupuytren]: Secondary | ICD-10-CM

## 2023-01-29 MED ORDER — MELOXICAM 7.5 MG PO TABS
7.5000 mg | ORAL_TABLET | Freq: Every day | ORAL | 2 refills | Status: DC | PRN
Start: 2023-01-29 — End: 2023-02-18
  Filled 2023-01-29 – 2023-02-18 (×2): qty 30, 30d supply, fill #0

## 2023-02-01 ENCOUNTER — Other Ambulatory Visit: Payer: Self-pay

## 2023-02-01 ENCOUNTER — Other Ambulatory Visit (HOSPITAL_COMMUNITY): Payer: Self-pay

## 2023-02-05 ENCOUNTER — Other Ambulatory Visit: Payer: Self-pay

## 2023-02-06 ENCOUNTER — Other Ambulatory Visit (HOSPITAL_COMMUNITY): Payer: Self-pay

## 2023-02-14 ENCOUNTER — Ambulatory Visit: Payer: Medicaid Other | Admitting: Neurology

## 2023-02-14 ENCOUNTER — Telehealth (INDEPENDENT_AMBULATORY_CARE_PROVIDER_SITE_OTHER): Payer: Self-pay | Admitting: Otolaryngology

## 2023-02-14 NOTE — Telephone Encounter (Signed)
 Tried to confirm appt & location No Answer - No VM [id number]

## 2023-02-15 ENCOUNTER — Ambulatory Visit (INDEPENDENT_AMBULATORY_CARE_PROVIDER_SITE_OTHER): Payer: MEDICAID | Admitting: Audiology

## 2023-02-15 ENCOUNTER — Ambulatory Visit (INDEPENDENT_AMBULATORY_CARE_PROVIDER_SITE_OTHER): Payer: MEDICAID

## 2023-02-15 DIAGNOSIS — H903 Sensorineural hearing loss, bilateral: Secondary | ICD-10-CM

## 2023-02-15 NOTE — Progress Notes (Signed)
  [address] [place], [place] [address] [phone]  Audiological Evaluation    Name: Socorro Ebron     DOB:   [birth year]      MRN:   [id number]                                                                                     Service Date: 02/15/2023     Accompanied by: unaccompanied    Patient comes today after Reyes Cohen, PA-C sent a referral for a hearing evaluation due to concerns with left side mixed hearing loss.   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   Speech Audiometry: Right ear- Speech Reception Threshold (SRT) was obtained at 30 dBHL Left ear-Speech Reception Threshold (SRT) was obtained at 35 dBHL   Word Recognition Score Tested using NU-6 (MLV) Right ear: 76% was obtained at a presentation level of 80 dBHL with contralateral masking which is deemed as  fair Left ear: 72% was obtained at a presentation level of 80 dBHL with contralateral masking which is deemed as  fair   The hearing test results were completed under headphones and results are deemed to be of good reliability. Test technique:  conventional     Impression: There was a significant improvement in left ear's  pure-tone thresholds today when compared to the previous audiogram on file.   Recommendations: Follow up with ENT when Reyes Cohen, PA-C is back in the clinic. He will schedule the appointment at checkout.   Cadden Elizondo MARIE LEROUX-MARTINEZ, AUD

## 2023-02-18 ENCOUNTER — Other Ambulatory Visit: Payer: Self-pay | Admitting: Pharmacist

## 2023-02-18 ENCOUNTER — Ambulatory Visit: Payer: MEDICAID | Attending: Internal Medicine | Admitting: Internal Medicine

## 2023-02-18 ENCOUNTER — Other Ambulatory Visit: Payer: Self-pay

## 2023-02-18 VITALS — BP 110/70 | HR 65 | Temp 97.9°F | Ht 69.0 in | Wt 134.0 lb

## 2023-02-18 DIAGNOSIS — F1091 Alcohol use, unspecified, in remission: Secondary | ICD-10-CM | POA: Diagnosis not present

## 2023-02-18 DIAGNOSIS — G8191 Hemiplegia, unspecified affecting right dominant side: Secondary | ICD-10-CM | POA: Diagnosis not present

## 2023-02-18 DIAGNOSIS — Z23 Encounter for immunization: Secondary | ICD-10-CM

## 2023-02-18 DIAGNOSIS — I10 Essential (primary) hypertension: Secondary | ICD-10-CM | POA: Diagnosis not present

## 2023-02-18 DIAGNOSIS — J439 Emphysema, unspecified: Secondary | ICD-10-CM

## 2023-02-18 DIAGNOSIS — Z122 Encounter for screening for malignant neoplasm of respiratory organs: Secondary | ICD-10-CM

## 2023-02-18 DIAGNOSIS — R918 Other nonspecific abnormal finding of lung field: Secondary | ICD-10-CM

## 2023-02-18 DIAGNOSIS — M720 Palmar fascial fibromatosis [Dupuytren]: Secondary | ICD-10-CM

## 2023-02-18 DIAGNOSIS — F17210 Nicotine dependence, cigarettes, uncomplicated: Secondary | ICD-10-CM

## 2023-02-18 DIAGNOSIS — Z720 Tobacco use: Secondary | ICD-10-CM

## 2023-02-18 MED ORDER — FOLIC ACID 1 MG PO TABS
1.0000 mg | ORAL_TABLET | Freq: Every day | ORAL | 0 refills | Status: DC
Start: 2023-02-18 — End: 2023-06-18
  Filled 2023-02-18 – 2023-03-28 (×2): qty 100, 100d supply, fill #0

## 2023-02-18 MED ORDER — METOPROLOL TARTRATE 50 MG PO TABS
50.0000 mg | ORAL_TABLET | Freq: Two times a day (BID) | ORAL | 0 refills | Status: DC
Start: 2023-02-18 — End: 2023-06-18
  Filled 2023-02-18 – 2023-03-28 (×2): qty 180, 90d supply, fill #0

## 2023-02-18 MED ORDER — MELOXICAM 7.5 MG PO TABS
7.5000 mg | ORAL_TABLET | Freq: Every day | ORAL | 0 refills | Status: DC | PRN
Start: 2023-02-18 — End: 2023-06-11
  Filled 2023-02-18 – 2023-03-28 (×2): qty 90, 90d supply, fill #0

## 2023-02-18 NOTE — Progress Notes (Signed)
 Patient ID: Brent Warren, male    DOB: 23-Feb-1960  MRN: [id number]  CC: Hypertension (HTN f/u. /No questions / concerns/Yes to pneumonia vax. )   Subjective: Brent Warren is a 63 y.o. male who presents for chronic ds management. 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 Had wax removed; hearing better.  Had hearing test that improved after wax removed  HTN/CVA: taking Metoprolol  50 mg BID.  Limits salt. Still some residual weakness in RT grip. Also RT shoulder hurts when he tries to elev it.  Rides his 21 speed bike several days a wk to and from the store to purchase cigarettes  COPD/Tob dep: down to 1 pk/wk from 1 pk a day. Not using Symbicort  daily and has not had to use Albuterol  inhaler in several wks. Has mneb but has not had to use it in several mths.  I reordered the CT of chest for screening on last visit.  Still was not done.  Still living with brother and sister-in-law. Does not have phone. PC on chart is for his brother.  ETOH UD: clean for over 1 yr coming up on 2 yrs. Denies any prior hx of street drug use.  Vit B12 WGN:FAOZH vit B12 supplement once a day  HM:  due for PCV vaccine  Patient Active Problem List   Diagnosis Date Noted   Aortic atherosclerosis (HCC) 06/07/2022   Vitamin B12 deficiency 06/07/2022   Elevated MCV 11/20/2021   ICH (intracerebral hemorrhage) (HCC) 11/18/2021   COVID-19 vaccine series completed 10/26/2019   Polyarthritis 02/05/2019   Chest pain in adult 04/02/2017   Immunization due 11/06/2016   Pulmonary emphysema (HCC) 11/06/2016   Urge incontinence 11/06/2016   Tobacco abuse 11/06/2016   ETOH abuse 11/06/2016   Lung nodule, multiple 11/08/2015   HTN (hypertension) 08/01/2015    Hyponatremia 05/22/2015   Hepatitis 05/22/2015     Current Outpatient Medications on File Prior to Visit  Medication Sig Dispense Refill   acetaminophen  (TYLENOL ) 325 MG tablet Take 2 tablets (650 mg total) by mouth every 4 (four) hours as needed for mild pain (or temp > 37.5 C (99.5 F)).     albuterol  (PROVENTIL ) (2.5 MG/3ML) 0.083% nebulizer solution Take 3 mLs (2.5 mg total) by nebulization every 6 (six) hours as needed for wheezing or shortness of breath. 90 mL 2   albuterol  (VENTOLIN  HFA) 108 (90 Base) MCG/ACT inhaler Inhale 2 puffs into the lungs every 6 (six) hours as needed for wheezing or shortness of breath. 18 g 1   baclofen  (LIORESAL ) 10 MG tablet Take 1 tablet (10 mg total) by mouth 2 (two) times daily. 60 each 5   budesonide -formoterol  (SYMBICORT ) 80-4.5 MCG/ACT inhaler Inhale 2 puffs into the lungs 2 (two) times daily 10.2 g 10   cyanocobalamin  1000 MCG tablet Take 1 tablet (1,000 mcg total) by mouth daily. 90 tablet 1   feeding supplement (ENSURE ENLIVE / ENSURE PLUS) LIQD Take 237 mLs by mouth 3 (three) times daily between meals. 237 mL 12   folic acid  (FOLVITE ) 1 MG tablet Take 1 tablet (1 mg total) by mouth daily. 100 tablet 1   magnesium  oxide (MAG-OX) 400 (240 Mg) MG tablet Take 1 tablet (400 mg total) by mouth daily.  100 tablet 0   meloxicam  (MOBIC ) 7.5 MG tablet Take 1 tablet (7.5 mg total) by mouth daily as needed for pain. 30 tablet 2   metoprolol  tartrate (LOPRESSOR ) 50 MG tablet Take 1 tablet (50 mg total) by mouth 2 (two) times daily. 180 tablet 1   Multiple Vitamin (MULTIVITAMIN WITH MINERALS) TABS tablet Take 1 tablet by mouth daily. 100 tablet 1   pantoprazole  (PROTONIX ) 40 MG tablet Take 1 tablet (40 mg total) by mouth daily.     polyethylene glycol (MIRALAX  / GLYCOLAX ) 17 g packet Take 17 g by mouth daily. 14 each 0   QUEtiapine  (SEROQUEL ) 50 MG tablet Take 1 tablet (50 mg total) by mouth 2 (two) times daily.     senna-docusate (SENOKOT-S) 8.6-50 MG tablet Take 2  tablets by mouth 2 (two) times daily.     thiamine  (VITAMIN B-1) 100 MG tablet Take 1 tablet (100 mg total) by mouth daily.     No current facility-administered medications on file prior to visit.    No Known Allergies  Social History   Socioeconomic History   Marital status: Single    Spouse name: Not on file   Number of children: Not on file   Years of education: Not on file   Highest education level: Not on file  Occupational History   Not on file  Tobacco Use   Smoking status: Every Day    Current packs/day: 1.00    Average packs/day: 1 pack/day for 47.3 years (47.3 ttl pk-yrs)    Types: Cigarettes    Start date: 07/08/2015   Smokeless tobacco: Never   Tobacco comments:    a pack in a couple of days  Vaping Use   Vaping status: Never Used  Substance and Sexual Activity   Alcohol use: Yes    Alcohol/week: 42.0 standard drinks of alcohol    Types: 42 Cans of beer per week    Comment: daily   Drug use: No   Sexual activity: Not Currently  Other Topics Concern   Not on file  Social History Narrative   Not on file   Social Drivers of Health   Financial Resource Strain: Not on file  Food Insecurity: No Food Insecurity (11/18/2021)   Hunger Vital Sign    Worried About Running Out of Food in the Last Year: Never true    Ran Out of Food in the Last Year: Never true  Transportation Needs: No Transportation Needs (11/18/2021)   PRAPARE - Administrator, Civil Service (Medical): No    Lack of Transportation (Non-Medical): No  Physical Activity: Not on file  Stress: Not on file  Social Connections: Not on file  Intimate Partner Violence: Not At Risk (11/18/2021)   Humiliation, Afraid, Rape, and Kick questionnaire    Fear of Current or Ex-Partner: No    Emotionally Abused: No    Physically Abused: No    Sexually Abused: No    Family History  Problem Relation Age of Onset   Heart attack Mother    Cirrhosis Father    Stroke Sister    Heart attack  Brother     Past Surgical History:  Procedure Laterality Date   arm surgery     DECORTICATION Right 06/03/2015   Procedure: DECORTICATION;  Surgeon: Zelphia Higashi, MD;  Location[facility] OR;  Service: Thoracic;  Laterality: Right;   VIDEO ASSISTED THORACOSCOPY (VATS)/EMPYEMA Right 06/03/2015   Procedure: VIDEO ASSISTED THORACOSCOPY (VATS)/EMPYEMA;  Surgeon: Zelphia Higashi, MD;  Location: [facility] OR;  Service: Thoracic;  Laterality: Right;    ROS: Review of Systems Negative except as stated above  PHYSICAL EXAM: BP 110/70 (BP Location: Left Arm, Patient Position: Sitting, Cuff Size: Normal)   Pulse 65   Temp 97.9 F (36.6 C) (Oral)   Ht 5\' 9"  (1.753 m)   Wt 134 lb (60.8 kg)   SpO2 98%   BMI 19.79 kg/m   Physical Exam   General appearance - alert, well appearing, older Caucasian male and in no distress Mental status - normal mood, behavior, speech, dress, motor activity, and thought processes Neck - supple, no significant adenopathy Chest -breath sounds mildly decreased bilaterally but good air entry. Heart - normal rate, regular rhythm, normal S1, S2, no murmurs, rubs, clicks or gallops Extremities - peripheral pulses normal, no pedal edema, no clubbing or cyanosis     Latest Ref Rng & Units 10/15/2022   11:57 AM 05/29/2022    1:29 PM 01/30/2022   10:19 AM  CMP  Glucose 70 - 99 mg/dL 92  562  91   BUN 8 - 27 mg/dL 11  11  10    Creatinine 0.76 - 1.27 mg/dL 1.30  8.65  7.84   Sodium 134 - 144 mmol/L 135  134  142   Potassium 3.5 - 5.2 mmol/L 5.2  3.8  5.1   Chloride 96 - 106 mmol/L 100  101  102   CO2 20 - 29 mmol/L 20  20  21    Calcium 8.6 - 10.2 mg/dL 9.8  9.4  69.6   Total Protein 6.0 - 8.5 g/dL 7.5  7.8  7.8   Total Bilirubin 0.0 - 1.2 mg/dL 0.3  0.8  0.4   Alkaline Phos 44 - 121 IU/L 115  80  122   AST 0 - 40 IU/L 20  24  16    ALT 0 - 44 IU/L 17  19  18     Lipid Panel     Component Value Date/Time   CHOL 152 10/15/2022 1157   TRIG 117 10/15/2022 1157   HDL  37 (L) 10/15/2022 1157   CHOLHDL 4.1 10/15/2022 1157   CHOLHDL 1.8 11/19/2021 0231   VLDL 10 11/19/2021 0231   LDLCALC 94 10/15/2022 1157    CBC    Component Value Date/Time   WBC 7.4 10/15/2022 1157   WBC 7.7 05/29/2022 1329   RBC 4.31 10/15/2022 1157   RBC 3.87 (L) 05/29/2022 1329   HGB 13.9 10/15/2022 1157   HCT 42.7 10/15/2022 1157   PLT 296 10/15/2022 1157   MCV 99 (H) 10/15/2022 1157   MCH 32.3 10/15/2022 1157   MCH 31.5 05/29/2022 1329   MCHC 32.6 10/15/2022 1157   MCHC 34.2 05/29/2022 1329   RDW 13.0 10/15/2022 1157   LYMPHSABS 2.5 05/29/2022 1329   MONOABS 0.6 05/29/2022 1329   EOSABS 0.1 05/29/2022 1329   BASOSABS 0.1 05/29/2022 1329    ASSESSMENT AND PLAN: 1. Essential hypertension (Primary) At goal.  Continue metoprolol  50 mg twice a day.  2. Right hemiparesis (HCC) Still has residual weakness with left grip.  He feels the strength in the right leg is much better now.  He rides his bike several times a week.  He will continue  3. Tobacco abuse Commended him on cutting back.  Strongly advised to quit.  Patient states he is working on trying to do so.  4. Pulmonary emphysema, unspecified emphysema type (HCC) Stable.  He is not having to use  the Symbicort  inhaler daily  5. Alcohol use disorder in remission Commended him on this.  Encouraged him to remain alcohol free.  6. Lung nodule, multiple Patient agreeable for us  to reorder CAT scan of the chest.  He will tell his brother to look out for any calls regarding an appointment to have this scheduled. - CT Chest Wo Contrast; Future  7. Screening for lung cancer - CT Chest Wo Contrast; Future  8. Need for vaccination against Streptococcus pneumoniae - PNEUMOCOCCAL CONJUGATE VACCINE 15-VALENT      Patient was given the opportunity to ask questions.  Patient verbalized understanding of the plan and was able to repeat key elements of the plan.   This documentation was completed using Social research officer, government.  Any transcriptional errors are unintentional.  No orders of the defined types were placed in this encounter.    Requested Prescriptions    No prescriptions requested or ordered in this encounter    No follow-ups on file.  Concetta Dee, MD, FACP

## 2023-02-27 ENCOUNTER — Encounter: Payer: Self-pay | Admitting: Internal Medicine

## 2023-03-04 ENCOUNTER — Other Ambulatory Visit: Payer: MEDICAID

## 2023-03-07 ENCOUNTER — Telehealth (INDEPENDENT_AMBULATORY_CARE_PROVIDER_SITE_OTHER): Payer: Self-pay | Admitting: Physician Assistant

## 2023-03-07 NOTE — Telephone Encounter (Signed)
 Tried to call to confirm appt & location - VM Full [id number] afm

## 2023-03-08 ENCOUNTER — Ambulatory Visit (INDEPENDENT_AMBULATORY_CARE_PROVIDER_SITE_OTHER): Payer: MEDICAID | Admitting: Physician Assistant

## 2023-03-08 VITALS — BP 116/75 | HR 81

## 2023-03-08 DIAGNOSIS — H903 Sensorineural hearing loss, bilateral: Secondary | ICD-10-CM | POA: Diagnosis not present

## 2023-03-08 NOTE — Progress Notes (Signed)
 Dear Dr. Laural Benes, Here is my assessment for our mutual patient, Brent Warren. Thank you for allowing me the opportunity to care for your patient. Please do not hesitate to contact me should you have any other questions. Sincerely, Burna Forts PA-C  Otolaryngology Clinic Note Referring provider: Dr. Laural Benes HPI:  Brent Warren is a 63 y.o. male kindly referred by Dr. Laural Benes   The patient is a 63 year old gentleman seen in our office for follow-up evaluation and discussion of audiology results.  The patient was last seen in the office on 01/04/2023.  Below is a recap of his previous office visit.  The patient seen today for follow-up evaluation of cerumen impaction and hearing loss.  The patient was last seen in the office on 11/09/2022.  He was a recap of that visit.  Update 03/11/2023  Since his last office visit he denies any significant complaints, he notes his hearing is at baseline, he denies infections, drainage, or any other concerning signs or symptoms today.   Update 01/04/2023   Since his last office visit he notes he has been doing well, and he denies any significant pain, pressure, drainage, or any other changes to his ear.  He denies any hearing changes.  He has been using over-the-counter earwax removal kit but has not been using it consistently and is not using any drops.   11/09/2022 [name] notes that he has had issues with earwax since he was a kid. He reports having his ears clogged regularly and was told this was from ear wax. He is retired but worked his entire adult life in [employer] where he was exposed to constant loud noises. He reports a gradual loss in hearing over time but notes that recently it has become worse and friends and family remark about his difficulty hearing. He reports the hearing is better in the right ear compared to the left. He has been using q tips in his ears. He denies head/neck trauma, recent or chronic ear infections, pain, drainage, sinus disease  or symptoms.  No history of cancer.     Independent Review of Additional Tests or Records:  Audiogram completed on 02/15/2023 showing right ear type AD, left ear type C, pure-tone audiometry normal sloping to profound sensorineural hearing loss from 250 Hz through 8000 Hz in both ears, SRT was 30 dBHL right ear, 35 dBHL left ear, word recognition score in the right ear was 76 at 80 dB HL, left ear 72 at 80 dBHL  Sensorineural hearing loss  PMH/Meds/All/SocHx/FamHx/ROS:   Past Medical History:  Diagnosis Date   Alcoholic (HCC)    Chest pain 04/02/2017   COPD (chronic obstructive pulmonary disease) (HCC)    Empyema of pleural space (HCC) 05/2015   ETOH abuse 11/06/2016   Hepatitis 05/22/2015   HTN (hypertension)    Immunization due 11/06/2016   Lung nodule, multiple 11/08/2015   Malnutrition of moderate degree 06/02/2015   Pneumonia 05/2015   Pulmonary emphysema (HCC) 11/06/2016   Tobacco abuse    Urge incontinence 11/06/2016     Past Surgical History:  Procedure Laterality Date   arm surgery     DECORTICATION Right 06/03/2015   Procedure: DECORTICATION;  Surgeon: [name], MD;  Location: [facility] OR;  Service: Thoracic;  Laterality: Right;   VIDEO ASSISTED THORACOSCOPY (VATS)/EMPYEMA Right 06/03/2015   Procedure: VIDEO ASSISTED THORACOSCOPY (VATS)/EMPYEMA;  Surgeon: [name], MD;  Location: [facility] OR;  Service: Thoracic;  Laterality: Right;    Family History  Problem Relation  Age of Onset   Heart attack Mother    Cirrhosis Father    Stroke Sister    Heart attack Brother      Social Connections: Not on file      Current Outpatient Medications:    acetaminophen (TYLENOL) 325 MG tablet, Take 2 tablets (650 mg total) by mouth every 4 (four) hours as needed for mild pain (or temp > 37.5 C (99.5 F))., Disp: , Rfl:    albuterol (PROVENTIL) (2.5 MG/3ML) 0.083% nebulizer solution, Take 3 mLs (2.5 mg total) by nebulization every 6 (six) hours as needed for wheezing or  shortness of breath., Disp: 90 mL, Rfl: 2   albuterol (VENTOLIN HFA) 108 (90 Base) MCG/ACT inhaler, Inhale 2 puffs into the lungs every 6 (six) hours as needed for wheezing or shortness of breath., Disp: 18 g, Rfl: 1   baclofen (LIORESAL) 10 MG tablet, Take 1 tablet (10 mg total) by mouth 2 (two) times daily., Disp: 60 each, Rfl: 5   budesonide-formoterol (SYMBICORT) 80-4.5 MCG/ACT inhaler, Inhale 2 puffs into the lungs 2 (two) times daily, Disp: 10.2 g, Rfl: 10   cyanocobalamin 1000 MCG tablet, Take 1 tablet (1,000 mcg total) by mouth daily., Disp: 90 tablet, Rfl: 1   feeding supplement (ENSURE ENLIVE / ENSURE PLUS) LIQD, Take 237 mLs by mouth 3 (three) times daily between meals., Disp: 237 mL, Rfl: 12   folic acid (FOLVITE) 1 MG tablet, Take 1 tablet (1 mg total) by mouth daily., Disp: 100 tablet, Rfl: 0   magnesium oxide (MAG-OX) 400 (240 Mg) MG tablet, Take 1 tablet (400 mg total) by mouth daily., Disp: 100 tablet, Rfl: 0   meloxicam (MOBIC) 7.5 MG tablet, Take 1 tablet (7.5 mg total) by mouth daily as needed for pain., Disp: 90 tablet, Rfl: 0   metoprolol tartrate (LOPRESSOR) 50 MG tablet, Take 1 tablet (50 mg total) by mouth 2 (two) times daily., Disp: 180 tablet, Rfl: 0   Multiple Vitamin (MULTIVITAMIN WITH MINERALS) TABS tablet, Take 1 tablet by mouth daily., Disp: 100 tablet, Rfl: 1   QUEtiapine (SEROQUEL) 50 MG tablet, Take 1 tablet (50 mg total) by mouth 2 (two) times daily., Disp: , Rfl:    thiamine (VITAMIN B-1) 100 MG tablet, Take 1 tablet (100 mg total) by mouth daily., Disp: , Rfl:    pantoprazole (PROTONIX) 40 MG tablet, Take 1 tablet (40 mg total) by mouth daily., Disp: , Rfl:    polyethylene glycol (MIRALAX / GLYCOLAX) 17 g packet, Take 17 g by mouth daily., Disp: 14 each, Rfl: 0   senna-docusate (SENOKOT-S) 8.6-50 MG tablet, Take 2 tablets by mouth 2 (two) times daily., Disp: , Rfl:    Physical Exam:   BP 116/75 (BP Location: Left Arm, Patient Position: Sitting, Cuff Size:  Normal)   Pulse 81   SpO2 98%   Pertinent Findings  CN II-XII intact  Bilateral EAC clear and TM intact with well pneumatized middle ear spaces Weber 512: equal Rinne 512: AC > BC b/l  Anterior rhinoscopy: Septum midline No lesions of oral cavity/oropharynx; dentition wnl No obviously palpable neck masses/lymphadenopathy/thyromegaly No respiratory distress or stridor  Seprately Identifiable Procedures:  None  Impression & Plans:  Lukas Pelcher is a 63 y.o. male with the following   Hearing loss-  Mr. Polhamus follows up today for evaluation of hearing loss.  He does have bilateral sensorineural hearing loss.  He notes that he is able to function on a day-to-day basis without significant reduction in quality  of life.  I did offer resources for hearing aids, he would like to wait and see.  I am happy to see him back at any time for any questions he may have in the future.  The patient verbalized understanding and agreement to today's plan had no further questions or concerns.   - f/u as needed   Thank you for allowing me the opportunity to care for your patient. Please do not hesitate to contact me should you have any other questions.  Sincerely, Burna Forts PA-C [facility] Phone: [phone] Fax: [phone]  03/08/2023, 3:54 PM

## 2023-03-11 ENCOUNTER — Encounter (INDEPENDENT_AMBULATORY_CARE_PROVIDER_SITE_OTHER): Payer: Self-pay

## 2023-03-28 ENCOUNTER — Other Ambulatory Visit: Payer: Self-pay

## 2023-03-29 ENCOUNTER — Ambulatory Visit: Payer: MEDICAID | Attending: Nurse Practitioner | Admitting: Nurse Practitioner

## 2023-03-29 ENCOUNTER — Other Ambulatory Visit: Payer: Self-pay

## 2023-03-29 ENCOUNTER — Encounter: Payer: Self-pay | Admitting: Nurse Practitioner

## 2023-03-29 VITALS — BP 101/66 | HR 74 | Resp 18 | Ht 69.0 in | Wt 133.8 lb

## 2023-03-29 DIAGNOSIS — F419 Anxiety disorder, unspecified: Secondary | ICD-10-CM

## 2023-03-29 MED ORDER — BUSPIRONE HCL 10 MG PO TABS
10.0000 mg | ORAL_TABLET | Freq: Two times a day (BID) | ORAL | 3 refills | Status: DC
Start: 2023-03-29 — End: 2023-10-18
  Filled 2023-03-29: qty 60, 30d supply, fill #0
  Filled 2023-06-11: qty 60, 30d supply, fill #1
  Filled 2023-08-15 – 2023-08-26 (×2): qty 60, 30d supply, fill #2
  Filled 2023-09-27: qty 60, 30d supply, fill #3

## 2023-03-29 NOTE — Progress Notes (Signed)
 Assessment & Plan:  [name] was seen today for anxiety.  Diagnoses and all orders for this visit:  Anxiety -     busPIRone  (BUSPAR ) 10 MG tablet; Take 1 tablet (10 mg total) by mouth 2 (two) times daily. FOR ANXIETY    Patient has been counseled on age-appropriate routine health concerns for screening and prevention. These are reviewed and up-to-date. Referrals have been placed accordingly. Immunizations are up-to-date or declined.    Subjective:   Chief Complaint  Patient presents with   Anxiety    Brent Warren 63 y.o. male presents to office today for Anxiety He is a patient of Dr. Lincoln Renshaw.   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 Medications tried in the past which he declines today include hydroxyzine  25 mg at bedtime and seroquel .     03/29/2023    1:30 PM 08/28/2022    4:05 PM 06/07/2022   11:28 AM  Depression screen PHQ 2/9  Decreased Interest 0 0 2  Down, Depressed, Hopeless 0 0 1  PHQ - 2 Score 0 0 3  Altered sleeping 0 0 0  Tired, decreased energy 0 0 1  Change in appetite 0 0 0  Feeling bad or failure about yourself  0 0 0  Trouble concentrating 0 0 0  Moving slowly or fidgety/restless 0 0 1  Suicidal thoughts 0 0 0  PHQ-9 Score 0 0 5  Difficult doing work/chores Not difficult at all         03/29/2023    1:30 PM 08/28/2022    4:05 PM 07/06/2021   11:30 AM 02/10/2021    4:39 PM  GAD 7 : Generalized Anxiety Score  Nervous, Anxious, on Edge 0 0 0 0  Control/stop worrying 0 0 0 0  Worry too much - different things 0 0 0 1  Trouble relaxing 0 0 0 0  Restless 0 0 2 1  Easily annoyed or irritable 0 0 1 0  Afraid - awful might happen 0 0  0 0  Total GAD 7 Score 0 0 3 2  Anxiety Difficulty Not difficult at all         Review of Systems  Constitutional:  Negative for fever, malaise/fatigue and weight loss.  HENT: Negative.  Negative for nosebleeds.   Eyes: Negative.  Negative for blurred vision, double vision and photophobia.  Respiratory: Negative.  Negative for cough and shortness of breath.   Cardiovascular: Negative.  Negative for chest pain, palpitations and leg swelling.  Gastrointestinal: Negative.  Negative for heartburn, nausea and vomiting.  Musculoskeletal: Negative.  Negative for myalgias.  Neurological: Negative.  Negative for dizziness, focal weakness, seizures and headaches.  Psychiatric/Behavioral:  Negative for suicidal ideas. The patient is nervous/anxious.     Past Medical History:  Diagnosis Date   Alcoholic (HCC)    Chest pain 04/02/2017   COPD (chronic obstructive pulmonary disease) (HCC)    Empyema of pleural space (HCC) 05/2015   ETOH abuse 11/06/2016   Hepatitis 05/22/2015   HTN (hypertension)    Immunization due 11/06/2016   Lung nodule, multiple 11/08/2015   Malnutrition of moderate degree 06/02/2015   Pneumonia 05/2015   Pulmonary emphysema (HCC) 11/06/2016  Tobacco abuse    Urge incontinence 11/06/2016    Past Surgical History:  Procedure Laterality Date   arm surgery     DECORTICATION Right 06/03/2015   Procedure: DECORTICATION;  Surgeon: Zelphia Higashi, MD;  Location: [facility] OR;  Service: Thoracic;  Laterality: Right;   VIDEO ASSISTED THORACOSCOPY (VATS)/EMPYEMA Right 06/03/2015   Procedure: VIDEO ASSISTED THORACOSCOPY (VATS)/EMPYEMA;  Surgeon: Zelphia Higashi, MD;  Location: [facility] OR;  Service: Thoracic;  Laterality: Right;    Family History  Problem Relation Age of Onset   Heart attack Mother    Cirrhosis Father    Stroke Sister    Heart attack Brother     Social History Reviewed with no changes to be made today.   Outpatient Medications Prior to Visit  Medication  Sig Dispense Refill   albuterol  (PROVENTIL ) (2.5 MG/3ML) 0.083% nebulizer solution Take 3 mLs (2.5 mg total) by nebulization every 6 (six) hours as needed for wheezing or shortness of breath. 90 mL 2   albuterol  (VENTOLIN  HFA) 108 (90 Base) MCG/ACT inhaler Inhale 2 puffs into the lungs every 6 (six) hours as needed for wheezing or shortness of breath. 18 g 1   baclofen  (LIORESAL ) 10 MG tablet Take 1 tablet (10 mg total) by mouth 2 (two) times daily. 60 each 5   budesonide -formoterol  (SYMBICORT ) 80-4.5 MCG/ACT inhaler Inhale 2 puffs into the lungs 2 (two) times daily 10.2 g 10   cyanocobalamin  1000 MCG tablet Take 1 tablet (1,000 mcg total) by mouth daily. 130 tablet 1   feeding supplement (ENSURE ENLIVE / ENSURE PLUS) LIQD Take 237 mLs by mouth 3 (three) times daily between meals. 237 mL 12   meloxicam  (MOBIC ) 7.5 MG tablet Take 1 tablet (7.5 mg total) by mouth daily as needed for pain. 90 tablet 0   metoprolol  tartrate (LOPRESSOR ) 50 MG tablet Take 1 tablet (50 mg total) by mouth 2 (two) times daily. 180 tablet 0   Multiple Vitamin (MULTIVITAMIN WITH MINERALS) TABS tablet Take 1 tablet by mouth daily. 100 tablet 1   pantoprazole  (PROTONIX ) 40 MG tablet Take 1 tablet (40 mg total) by mouth daily.     acetaminophen  (TYLENOL ) 325 MG tablet Take 2 tablets (650 mg total) by mouth every 4 (four) hours as needed for mild pain (or temp > 37.5 C (99.5 F)). (Patient not taking: Reported on 03/29/2023)     folic acid  (FOLVITE ) 1 MG tablet Take 1 tablet (1 mg total) by mouth daily. 100 tablet 0   magnesium  oxide (MAG-OX) 400 (240 Mg) MG tablet Take 1 tablet (400 mg total) by mouth daily. 100 tablet 0   polyethylene glycol (MIRALAX  / GLYCOLAX ) 17 g packet Take 17 g by mouth daily. (Patient not taking: Reported on 03/29/2023) 14 each 0   QUEtiapine  (SEROQUEL ) 50 MG tablet Take 1 tablet (50 mg total) by mouth 2 (two) times daily.     senna-docusate (SENOKOT-S) 8.6-50 MG tablet Take 2 tablets by mouth 2 (two) times  daily. (Patient not taking: Reported on 03/29/2023)     thiamine  (VITAMIN B-1) 100 MG tablet Take 1 tablet (100 mg total) by mouth daily.     No facility-administered medications prior to visit.    No Known Allergies     Objective:    BP 101/66 (BP Location: Left Arm, Patient Position: Sitting, Cuff Size: Normal)   Pulse 74   Resp 18   Ht 5\' 9"  (1.753 m)   Wt 133 lb 12.8 oz (60.7 kg)   SpO2  97%   BMI 19.76 kg/m  Wt Readings from Last 3 Encounters:  03/29/23 133 lb 12.8 oz (60.7 kg)  02/18/23 134 lb (60.8 kg)  10/15/22 127 lb (57.6 kg)    Physical Exam Vitals and nursing note reviewed.  Constitutional:      Appearance: He is well-developed.  HENT:     Head: Normocephalic and atraumatic.  Cardiovascular:     Rate and Rhythm: Normal rate and regular rhythm.     Heart sounds: Normal heart sounds. No murmur heard.    No friction rub. No gallop.  Pulmonary:     Effort: Pulmonary effort is normal. No tachypnea or respiratory distress.     Breath sounds: Normal breath sounds. No decreased breath sounds, wheezing, rhonchi or rales.  Chest:     Chest wall: No tenderness.  Musculoskeletal:        General: Normal range of motion.     Cervical back: Normal range of motion.  Skin:    General: Skin is warm and dry.  Neurological:     Mental Status: He is alert and oriented to person, place, and time.     Coordination: Coordination normal.  Psychiatric:        Behavior: Behavior normal. Behavior is cooperative.        Thought Content: Thought content normal.        Judgment: Judgment normal.          Patient has been counseled extensively about nutrition and exercise as well as the importance of adherence with medications and regular follow-up. The patient was given clear instructions to go to ER or return to medical center if symptoms don't improve, worsen or new problems develop. The patient verbalized understanding.   Follow-up: Return if symptoms worsen or fail to  improve.   Collins Dean, FNP-BC [facility] [place], [place] [phone]   05/12/2023, 9:22 PM

## 2023-03-29 NOTE — Patient Instructions (Addendum)
 For CT of your lungs [facility] [address]   [phone]

## 2023-04-23 ENCOUNTER — Ambulatory Visit
Admission: RE | Admit: 2023-04-23 | Discharge: 2023-04-23 | Disposition: A | Discharge status: Home / Self Care | Payer: MEDICAID | Source: Ambulatory Visit | Attending: Internal Medicine | Admitting: Internal Medicine

## 2023-04-23 DIAGNOSIS — R918 Other nonspecific abnormal finding of lung field: Secondary | ICD-10-CM

## 2023-04-23 DIAGNOSIS — Z122 Encounter for screening for malignant neoplasm of respiratory organs: Secondary | ICD-10-CM

## 2023-05-11 ENCOUNTER — Telehealth: Payer: Self-pay | Admitting: Internal Medicine

## 2023-05-11 NOTE — Telephone Encounter (Signed)
 PC placed to pt today to go over results of CT scan of chest report.  LVMM informing of who I am and reason for calling. I then tried to call pt's brother.  Got VM but was unable to leave a message.  Will have my CMA reach out to him next wk.

## 2023-05-11 NOTE — Progress Notes (Signed)
 Let patient know that the CAT scan of his lungs showed that the previous small nodules seen in the lungs have remained stable in size.  There is a new nodule that has appeared in the right upper lobe of the lungs.  Radiologist recommended that we repeat the CAT scan in 3 to 6 months to follow this new nodule.  We will plan to order the CAT scan when I see him in June.  Please keep that appointment.

## 2023-05-12 ENCOUNTER — Encounter: Payer: Self-pay | Admitting: Nurse Practitioner

## 2023-05-22 ENCOUNTER — Ambulatory Visit: Payer: Self-pay

## 2023-06-11 ENCOUNTER — Ambulatory Visit: Payer: Self-pay

## 2023-06-11 ENCOUNTER — Other Ambulatory Visit: Payer: Self-pay

## 2023-06-11 DIAGNOSIS — M720 Palmar fascial fibromatosis [Dupuytren]: Secondary | ICD-10-CM

## 2023-06-11 MED ORDER — MELOXICAM 7.5 MG PO TABS
7.5000 mg | ORAL_TABLET | Freq: Every day | ORAL | 0 refills | Status: DC | PRN
  Filled 2023-06-11: qty 90, 90d supply, fill #0

## 2023-06-11 NOTE — Telephone Encounter (Signed)
 Call placed to patient unable to reach message left on VM.

## 2023-06-11 NOTE — Telephone Encounter (Signed)
 Copied from CRM [id number]. Topic: Clinical - Lab/Test Results >> Jun 11, 2023  2:01 PM Baldomero Bone wrote: Reason for CRM: [name], brother, returning a call regarding CT results. Per CAL, send to E2C2 to go over results. Transferred to NT.  Triage Nurse Note Pt brother with pt in background calling for results of CT completed on 4/15. Nurse advised that sending message to PCP office for call back to pt/brother with results so they have opportunity to ask more questions if need be. Pt and brother are poor historians. Pt and brother reporting that pt not feeling worse, seems to not have worse SOB than usual today, but "still smokes." Pt brother requesting refills on all meds but does not know which meds need refills, wanting refill of "most recent one," "all the other stuff, might need some naproxen, meloxicam  too, whatever y'all got there." Advised that buspirone  was sent as 60-days with 3 refills, advised they call pharmacy to get refill prepped. Pt brother requesting buspirone  be prescribed in 90-day supply, also requesting "more mg's," confirms requesting higher dose of buspirone , stating that "it is helping but not enough dose per day." Confirmed that pt's next appt is scheduled for 6/10 at 11:10 am and advised call back if further questions/concerns, worsening or new symptoms. Please call pt/brother back to clarify CT results and go over meds.  Reason for Disposition  [1] Caller requesting NON-URGENT health information AND [2] PCP's office is the best resource  Answer Assessment - Initial Assessment Questions 1. REASON FOR CALL or QUESTION: "What is your reason for calling today?" or "How can I best help you?" or "What question do you have that I can help answer?"      Needs results for CT Still smokes just not as much, breathing normal today, not feeling worse Refills on all meds, don't know which ones Think plenty of the symbicort  Check on the rest of them All the other stuff Might need some  naproxen Meloxicam  too Whatever y'all got there Need 90 days buspirone  if possible and higher dose of med, it is helping but not enough dose per day  Protocols used: Information Only Call - No Triage-A-AH

## 2023-06-18 ENCOUNTER — Other Ambulatory Visit: Payer: Self-pay

## 2023-06-18 ENCOUNTER — Ambulatory Visit: Payer: MEDICAID | Attending: Internal Medicine | Admitting: Internal Medicine

## 2023-06-18 ENCOUNTER — Telehealth: Payer: Self-pay

## 2023-06-18 ENCOUNTER — Encounter: Payer: Self-pay | Admitting: Internal Medicine

## 2023-06-18 VITALS — BP 118/67 | HR 55 | Temp 97.8°F | Ht 69.0 in | Wt 131.0 lb

## 2023-06-18 DIAGNOSIS — J439 Emphysema, unspecified: Secondary | ICD-10-CM | POA: Diagnosis not present

## 2023-06-18 DIAGNOSIS — I10 Essential (primary) hypertension: Secondary | ICD-10-CM | POA: Diagnosis not present

## 2023-06-18 DIAGNOSIS — R918 Other nonspecific abnormal finding of lung field: Secondary | ICD-10-CM

## 2023-06-18 DIAGNOSIS — Z720 Tobacco use: Secondary | ICD-10-CM

## 2023-06-18 DIAGNOSIS — M720 Palmar fascial fibromatosis [Dupuytren]: Secondary | ICD-10-CM

## 2023-06-18 DIAGNOSIS — F17210 Nicotine dependence, cigarettes, uncomplicated: Secondary | ICD-10-CM | POA: Diagnosis not present

## 2023-06-18 DIAGNOSIS — I6930 Unspecified sequelae of cerebral infarction: Secondary | ICD-10-CM

## 2023-06-18 DIAGNOSIS — F1091 Alcohol use, unspecified, in remission: Secondary | ICD-10-CM

## 2023-06-18 MED ORDER — MELOXICAM 7.5 MG PO TABS
7.5000 mg | ORAL_TABLET | Freq: Every day | ORAL | 1 refills | Status: DC | PRN
  Filled 2023-06-18 – 2023-06-28 (×2): qty 90, 90d supply, fill #0
  Filled 2023-09-27: qty 90, 90d supply, fill #1

## 2023-06-18 MED ORDER — FOLIC ACID 1 MG PO TABS
1.0000 mg | ORAL_TABLET | Freq: Every day | ORAL | 0 refills | Status: DC
  Filled 2023-06-18: qty 100, 100d supply, fill #0
  Filled 2023-06-28: qty 30, 30d supply, fill #0
  Filled 2023-08-15 – 2023-08-26 (×2): qty 30, 30d supply, fill #1
  Filled 2023-09-27: qty 30, 30d supply, fill #2

## 2023-06-18 MED ORDER — ALBUTEROL SULFATE HFA 108 (90 BASE) MCG/ACT IN AERS
2.0000 | INHALATION_SPRAY | Freq: Four times a day (QID) | RESPIRATORY_TRACT | 1 refills | Status: AC | PRN
Start: 2023-06-18 — End: ?
  Filled 2023-06-18: qty 18, 25d supply, fill #0
  Filled 2023-08-15 – 2023-08-26 (×2): qty 18, 25d supply, fill #1

## 2023-06-18 MED ORDER — VARENICLINE TARTRATE (STARTER) 0.5 MG X 11 & 1 MG X 42 PO TBPK
ORAL_TABLET | ORAL | 0 refills | Status: DC
  Filled 2023-06-18: qty 53, 30d supply, fill #0

## 2023-06-18 MED ORDER — METOPROLOL TARTRATE 50 MG PO TABS
50.0000 mg | ORAL_TABLET | Freq: Two times a day (BID) | ORAL | 3 refills | Status: AC
  Filled 2023-06-18 – 2023-06-28 (×2): qty 180, 90d supply, fill #0
  Filled 2023-09-27: qty 180, 90d supply, fill #1
  Filled 2023-12-16: qty 180, 90d supply, fill #2

## 2023-06-18 NOTE — Progress Notes (Signed)
 Patient ID: Brent Warren, male    DOB: 15-Apr-1960  MRN: [id number]  CC: Hypertension (HTN f/u. Med refill. /No questions / concerns)   Subjective: Brent Warren is a 63 y.o. male who presents for chronic ds management. 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  History of Present Illness Brent Warren is a 63 year old male with hypertension and a history of hemorrhagic stroke who presents for follow-up on blood pressure management and smoking cessation.  He is on metoprolol  50 mg twice daily for hypertension. Blood pressure today is 146/66 mmHg. He does not monitor blood pressure at home due to lack of a device. No chest pain or shortness of breath.  He smokes half a pack per day for fifty years and finds quitting challenging. He is considering medication for smoking cessation.  Hx of hemorrhagic CVA.  He is not on cholesterol medication or aspirin. Last cholesterol check in October showed LDL of 94 mg/dL. Diet includes occasional hamburgers and a [employer] biscuit with egg and cheese in the morning. He does not cook often.  He has emphysema (COPD) and has a Symbicort  inhaler, which he has not needed recently. Recent CT scan showed stable lung nodules with a new nodule in the right upper lobe measuring 6 mm.  Radiologist recommends repeat in 3 to 6 months.  I suggest that it be repeated in September of this year.  He takes meloxicam  for RT hand pain due to contractions, vitamin B12, folic acid , and Buspar  for anxiety. He no longer takes Seroquel  or a stool softener. Appetite and sleep pattern are good.  He has history of  alcohol use disorder.  He has remained in remission going on 2 years now.    Patient Active Problem List   Diagnosis Date Noted   Right hemiparesis (HCC) 02/18/2023   Aortic atherosclerosis (HCC) 06/07/2022   Vitamin B12 deficiency 06/07/2022   Elevated MCV 11/20/2021   ICH (intracerebral hemorrhage) (HCC) 11/18/2021   COVID-19 vaccine series completed 10/26/2019   Polyarthritis 02/05/2019   Chest pain in adult 04/02/2017   Immunization due 11/06/2016   Pulmonary emphysema (HCC) 11/06/2016   Urge incontinence 11/06/2016   Tobacco abuse 11/06/2016   ETOH abuse 11/06/2016   Lung nodule, multiple 11/08/2015   HTN (hypertension) 08/01/2015   Hyponatremia 05/22/2015   Hepatitis 05/22/2015     Current Outpatient Medications on File Prior to Visit  Medication Sig Dispense Refill   budesonide -formoterol  (SYMBICORT ) 80-4.5 MCG/ACT inhaler Inhale 2 puffs into the lungs 2 (two) times daily 10.2 g 10   busPIRone  (BUSPAR ) 10 MG tablet Take 1 tablet (10 mg total) by mouth 2 (two) times daily. FOR ANXIETY 60 tablet 3   cyanocobalamin  1000 MCG tablet Take 1 tablet (1,000 mcg total) by mouth daily. 130 tablet 1   pantoprazole  (PROTONIX ) 40 MG tablet Take 1 tablet (40 mg total) by mouth daily.     albuterol  (PROVENTIL ) (2.5 MG/3ML) 0.083% nebulizer solution Take 3 mLs (2.5 mg total) by nebulization every 6 (six) hours as needed for wheezing or shortness of breath. (Patient not taking: Reported on 06/18/2023) 90 mL 2   Multiple  Vitamin (MULTIVITAMIN WITH MINERALS) TABS tablet Take 1 tablet by mouth daily. (Patient not taking: Reported on 06/18/2023) 100 tablet 1   No current facility-administered medications on file prior to visit.    No Known Allergies  Social History   Socioeconomic History   Marital status: Single    Spouse name: Not on file   Number of children: Not on file   Years of education: Not on file   Highest education level: Not on file  Occupational History   Not on file   Tobacco Use   Smoking status: Every Day    Current packs/day: 1.00    Average packs/day: 1 pack/day for 47.6 years (47.6 ttl pk-yrs)    Types: Cigarettes    Start date: 07/08/2015   Smokeless tobacco: Never   Tobacco comments:    a pack in a couple of days  Vaping Use   Vaping status: Never Used  Substance and Sexual Activity   Alcohol use: Yes    Alcohol/week: 42.0 standard drinks of alcohol    Types: 42 Cans of beer per week    Comment: daily   Drug use: No   Sexual activity: Not Currently  Other Topics Concern   Not on file  Social History Narrative   Not on file   Social Drivers of Health   Financial Resource Strain: Not on file  Food Insecurity: No Food Insecurity (11/18/2021)   Hunger Vital Sign    Worried About Running Out of Food in the Last Year: Never true    Ran Out of Food in the Last Year: Never true  Transportation Needs: No Transportation Needs (11/18/2021)   PRAPARE - Administrator, Civil Service (Medical): No    Lack of Transportation (Non-Medical): No  Physical Activity: Not on file  Stress: Not on file  Social Connections: Not on file  Intimate Partner Violence: Not At Risk (11/18/2021)   Humiliation, Afraid, Rape, and Kick questionnaire    Fear of Current or Ex-Partner: No    Emotionally Abused: No    Physically Abused: No    Sexually Abused: No    Family History  Problem Relation Age of Onset   Heart attack Mother    Cirrhosis Father    Stroke Sister    Heart attack Brother     Past Surgical History:  Procedure Laterality Date   arm surgery     DECORTICATION Right 06/03/2015   Procedure: DECORTICATION;  Surgeon: Zelphia Higashi, MD;  Location: [facility] OR;  Service: Thoracic;  Laterality: Right;   VIDEO ASSISTED THORACOSCOPY (VATS)/EMPYEMA Right 06/03/2015   Procedure: VIDEO ASSISTED THORACOSCOPY (VATS)/EMPYEMA;  Surgeon: Zelphia Higashi, MD;  Location: [facility] OR;  Service: Thoracic;  Laterality: Right;    ROS: Review of  Systems Negative except as stated above  PHYSICAL EXAM: BP (!) 146/66 (BP Location: Left Arm, Patient Position: Sitting, Cuff Size: Normal)   Pulse (!) 55   Temp 97.8 F (36.6 C) (Oral)   Ht 5\' 9"  (1.753 m)   Wt 131 lb (59.4 kg)   SpO2 100%   BMI 19.35 kg/m   Wt Readings from Last 3 Encounters:  06/18/23 131 lb (59.4 kg)  03/29/23 133 lb 12.8 oz (60.7 kg)  02/18/23 134 lb (60.8 kg)    Physical Exam   General appearance -older Caucasian male in NAD Mental status -patient is alert and oriented.  He is very talkative and has to be redirected Chest -breath sounds mildly decreased bilaterally.  No  wheezes or crackles Heart - normal rate, regular rhythm, normal S1, S2, no murmurs, rubs, clicks or gallops Extremities - peripheral pulses normal, no pedal edema, no clubbing or cyanosis MSK: He has mild contractures at the PIP joints in the right hand.    Latest Ref Rng & Units 10/15/2022   11:57 AM 05/29/2022    1:29 PM 01/30/2022   10:19 AM  CMP  Glucose 70 - 99 mg/dL 92  829  91   BUN 8 - 27 mg/dL 11  11  10    Creatinine 0.76 - 1.27 mg/dL 5.62  1.30  8.65   Sodium 134 - 144 mmol/L 135  134  142   Potassium 3.5 - 5.2 mmol/L 5.2  3.8  5.1   Chloride 96 - 106 mmol/L 100  101  102   CO2 20 - 29 mmol/L 20  20  21    Calcium 8.6 - 10.2 mg/dL 9.8  9.4  78.4   Total Protein 6.0 - 8.5 g/dL 7.5  7.8  7.8   Total Bilirubin 0.0 - 1.2 mg/dL 0.3  0.8  0.4   Alkaline Phos 44 - 121 IU/L 115  80  122   AST 0 - 40 IU/L 20  24  16    ALT 0 - 44 IU/L 17  19  18     Lipid Panel     Component Value Date/Time   CHOL 152 10/15/2022 1157   TRIG 117 10/15/2022 1157   HDL 37 (L) 10/15/2022 1157   CHOLHDL 4.1 10/15/2022 1157   CHOLHDL 1.8 11/19/2021 0231   VLDL 10 11/19/2021 0231   LDLCALC 94 10/15/2022 1157    CBC    Component Value Date/Time   WBC 7.4 10/15/2022 1157   WBC 7.7 05/29/2022 1329   RBC 4.31 10/15/2022 1157   RBC 3.87 (L) 05/29/2022 1329   HGB 13.9 10/15/2022 1157   HCT 42.7  10/15/2022 1157   PLT 296 10/15/2022 1157   MCV 99 (H) 10/15/2022 1157   MCH 32.3 10/15/2022 1157   MCH 31.5 05/29/2022 1329   MCHC 32.6 10/15/2022 1157   MCHC 34.2 05/29/2022 1329   RDW 13.0 10/15/2022 1157   LYMPHSABS 2.5 05/29/2022 1329   MONOABS 0.6 05/29/2022 1329   EOSABS 0.1 05/29/2022 1329   BASOSABS 0.1 05/29/2022 1329    ASSESSMENT AND PLAN: 1. Essential hypertension (Primary) At goal.  Continue metoprolol  - metoprolol  tartrate (LOPRESSOR ) 50 MG tablet; Take 1 tablet (50 mg total) by mouth 2 (two) times daily.  Dispense: 180 tablet; Refill: 3 - Hepatic Function Panel - Lipid panel  2. Tobacco abuse Strongly advised to quit.  He is aware of health risks associated with smoking.  He is not willing to commit to quitting but would like to try the Chantix when he feels he is ready.  I went over with him the starter pack and continuation pack.  Can cause bad dreams. - Varenicline Tartrate, Starter, (CHANTIX STARTING MONTH PAK) 0.5 MG X 11 & 1 MG X 42 TBPK; Take as directed.  Dispense: 53 each; Refill: 0  3. Lung nodule, multiple Plan to repeat CAT scan in September - CT Chest Wo Contrast; Future  4. Pulmonary emphysema, unspecified emphysema type (HCC) Asymptomatic.  Again strongly advised to quit smoking.  He will keep the Symbicort  and albuterol  on hand to use if needed - albuterol  (VENTOLIN  HFA) 108 (90 Base) MCG/ACT inhaler; Inhale 2 puffs into the lungs every 6 (six) hours as needed for wheezing or  shortness of breath.  Dispense: 18 g; Refill: 1  5. Dupuytren's contracture He reports better movement of the hands with meloxicam .  Refills given - meloxicam  (MOBIC ) 7.5 MG tablet; Take 1 tablet (7.5 mg total) by mouth daily as needed for pain.  Dispense: 90 tablet; Refill: 1  6. Alcohol use disorder in remission - folic acid  (FOLVITE ) 1 MG tablet; Take 1 tablet (1 mg total) by mouth daily.  Dispense: 100 tablet; Refill: 0  7. History of hemorrhagic cerebrovascular  accident (CVA) with residual deficit Will check liver function tests and lipid profile today with plan to start statin therapy based on results.  Patient was given the opportunity to ask questions.  Patient verbalized understanding of the plan and was able to repeat key elements of the plan.   This documentation was completed using Paediatric nurse.  Any transcriptional errors are unintentional.  Orders Placed This Encounter  Procedures   CT Chest Wo Contrast   Hepatic Function Panel   Lipid panel     Requested Prescriptions   Signed Prescriptions Disp Refills   albuterol  (VENTOLIN  HFA) 108 (90 Base) MCG/ACT inhaler 18 g 1    Sig: Inhale 2 puffs into the lungs every 6 (six) hours as needed for wheezing or shortness of breath.   meloxicam  (MOBIC ) 7.5 MG tablet 90 tablet 1    Sig: Take 1 tablet (7.5 mg total) by mouth daily as needed for pain.   folic acid  (FOLVITE ) 1 MG tablet 100 tablet 0    Sig: Take 1 tablet (1 mg total) by mouth daily.   metoprolol  tartrate (LOPRESSOR ) 50 MG tablet 180 tablet 3    Sig: Take 1 tablet (50 mg total) by mouth 2 (two) times daily.   Varenicline Tartrate, Starter, (CHANTIX STARTING MONTH PAK) 0.5 MG X 11 & 1 MG X 42 TBPK 53 each 0    Sig: Take as directed.    Return in about 4 months (around 10/18/2023).  Concetta Dee, MD, FACP

## 2023-06-18 NOTE — Telephone Encounter (Signed)
 Patient is requesting for recent CT Chest Wo Contrast from 04/23/2023 to be mailed to him if possible.

## 2023-06-18 NOTE — Patient Instructions (Signed)
 VISIT SUMMARY:  Today, we discussed your blood pressure management, smoking cessation, and general health maintenance. Your blood pressure was slightly elevated, and we talked about ways to monitor it at home. We also discussed your smoking habits and potential medication to help you quit. Additionally, we reviewed your cholesterol levels and the need for further tests and possible medication. We also talked about your COPD and the need for a follow-up CT scan to monitor lung nodules.  YOUR PLAN:  -HYPERTENSION: Your blood pressure was 146/66 mmHg today, which is a bit high. You will continue taking metoprolol  50 mg twice daily. It's important to get a home blood pressure monitor to keep track of your levels regularly.  -HEMORRHAGIC STROKE: You had a stroke in 2021. To help prevent another stroke, you will start taking a low-dose aspirin. We will also check your liver function before starting you on a low-dose statin to manage your cholesterol.  -HYPERLIPIDEMIA: Your LDL cholesterol level is 94 mg/dL, which is higher than the target of less than 70 mg/dL for someone with your history. We will check your liver function tests and, if they are normal, start you on a low-dose statin. You should also try to eat more lean meats and less beef and pork.  -CHRONIC OBSTRUCTIVE PULMONARY DISEASE (COPD): You have COPD and use a Symbicort  inhaler. A recent CT scan showed stable lung nodules but a new one in the right upper lobe. We will schedule another CT scan in September to monitor these nodules. Use your Symbicort  inhaler as needed.  -TOBACCO USE DISORDER: You have been smoking half a pack per day for 50 years and are considering quitting. We discussed prescribing Chantix to help you quit, and we talked about its potential side effects, including bad dreams.  -GENERAL HEALTH MAINTENANCE: You have been abstinent from alcohol for two years, which is great. We discussed dietary changes to help lower your  cholesterol and the need to monitor your liver function regularly, especially if you start taking a statin.  INSTRUCTIONS:  Please schedule liver function tests and a follow-up CT scan for your lung nodules in September. Continue to monitor your blood pressure at home and follow up with us  for blood pressure management and medication adherence.

## 2023-06-19 ENCOUNTER — Ambulatory Visit: Payer: Self-pay | Admitting: Internal Medicine

## 2023-06-19 LAB — LIPID PANEL
Chol/HDL Ratio: 4.1 ratio (ref 0.0–5.0)
Cholesterol, Total: 131 mg/dL (ref 100–199)
HDL: 32 mg/dL — ABNORMAL LOW (ref >39)
LDL Chol Calc (NIH): 75 mg/dL (ref 0–99)
Triglycerides: 135 mg/dL (ref 0–149)
VLDL Cholesterol Cal: 24 mg/dL (ref 5–40)

## 2023-06-19 LAB — HEPATIC FUNCTION PANEL
ALT: 11 IU/L (ref 0–44)
AST: 15 IU/L (ref 0–40)
Albumin: 4 g/dL (ref 3.9–4.9)
Alkaline Phosphatase: 106 IU/L (ref 44–121)
Bilirubin Total: <0.2 mg/dL (ref 0.0–1.2)
Bilirubin, Direct: 0.08 mg/dL (ref 0.00–0.40)
Total Protein: 6.7 g/dL (ref 6.0–8.5)

## 2023-06-19 NOTE — Telephone Encounter (Signed)
 Results sent to patient via mail on 06/19/2023.

## 2023-06-27 ENCOUNTER — Other Ambulatory Visit: Payer: Self-pay

## 2023-06-28 ENCOUNTER — Other Ambulatory Visit: Payer: Self-pay

## 2023-07-05 ENCOUNTER — Other Ambulatory Visit (HOSPITAL_COMMUNITY): Payer: Self-pay

## 2023-08-15 ENCOUNTER — Other Ambulatory Visit: Payer: Self-pay

## 2023-08-15 ENCOUNTER — Other Ambulatory Visit: Payer: Self-pay | Admitting: Internal Medicine

## 2023-08-15 DIAGNOSIS — J439 Emphysema, unspecified: Secondary | ICD-10-CM

## 2023-08-15 MED ORDER — BUDESONIDE-FORMOTEROL FUMARATE 80-4.5 MCG/ACT IN AERO
2.0000 | INHALATION_SPRAY | Freq: Two times a day (BID) | RESPIRATORY_TRACT | 10 refills | Status: AC
  Filled 2023-08-15 – 2023-08-26 (×2): qty 10.2, 30d supply, fill #0
  Filled 2023-09-27: qty 10.2, 30d supply, fill #1
  Filled 2023-11-26 – 2023-12-16 (×2): qty 10.2, 30d supply, fill #2

## 2023-08-20 ENCOUNTER — Other Ambulatory Visit: Payer: Self-pay

## 2023-08-21 ENCOUNTER — Telehealth: Payer: Self-pay | Admitting: Internal Medicine

## 2023-08-21 ENCOUNTER — Other Ambulatory Visit: Payer: Self-pay

## 2023-08-21 NOTE — Telephone Encounter (Signed)
 Copied from CRM [id number]. Topic: Clinical - Medication Question >> Aug 21, 2023 12:31 PM [name] wrote: Reason for CRM: Pt's brother called to report that the patient needs a low strength valium . His brother reports that the patient is very irritable and seems to need something to calm him down. Pt is behaving unusually. Says his current medication is not working.   Requesting a low MG valium .

## 2023-08-21 NOTE — Telephone Encounter (Addendum)
 Needs to be seen. Please schedule appt for pt to be seen,

## 2023-08-22 ENCOUNTER — Other Ambulatory Visit: Payer: Self-pay

## 2023-08-23 ENCOUNTER — Other Ambulatory Visit: Payer: Self-pay

## 2023-08-23 NOTE — Telephone Encounter (Signed)
 Called but no answer. LVM to call back and schedule an appointment.

## 2023-08-26 ENCOUNTER — Other Ambulatory Visit: Payer: Self-pay

## 2023-08-26 NOTE — Telephone Encounter (Signed)
 Called but no answer. LVM to call back and schedule an appointment to address concerns.

## 2023-09-06 ENCOUNTER — Ambulatory Visit (INDEPENDENT_AMBULATORY_CARE_PROVIDER_SITE_OTHER): Payer: MEDICAID | Admitting: Physician Assistant

## 2023-09-27 ENCOUNTER — Other Ambulatory Visit: Payer: Self-pay

## 2023-10-01 ENCOUNTER — Other Ambulatory Visit: Payer: Self-pay

## 2023-10-01 ENCOUNTER — Other Ambulatory Visit (HOSPITAL_COMMUNITY): Payer: Self-pay

## 2023-10-16 ENCOUNTER — Telehealth: Payer: Self-pay | Admitting: Internal Medicine

## 2023-10-16 NOTE — Telephone Encounter (Signed)
 Voicemail left by volunteer to confirm patient's appointment for 10/18/2023.

## 2023-10-18 ENCOUNTER — Encounter: Payer: Self-pay | Admitting: Internal Medicine

## 2023-10-18 ENCOUNTER — Ambulatory Visit: Payer: MEDICAID | Attending: Internal Medicine | Admitting: Internal Medicine

## 2023-10-18 ENCOUNTER — Other Ambulatory Visit: Payer: Self-pay

## 2023-10-18 VITALS — BP 134/77 | HR 53 | Temp 97.8°F | Ht 69.0 in | Wt 130.0 lb

## 2023-10-18 DIAGNOSIS — I10 Essential (primary) hypertension: Secondary | ICD-10-CM | POA: Diagnosis not present

## 2023-10-18 DIAGNOSIS — F17218 Nicotine dependence, cigarettes, with other nicotine-induced disorders: Secondary | ICD-10-CM

## 2023-10-18 DIAGNOSIS — Z720 Tobacco use: Secondary | ICD-10-CM

## 2023-10-18 DIAGNOSIS — I69351 Hemiplegia and hemiparesis following cerebral infarction affecting right dominant side: Secondary | ICD-10-CM

## 2023-10-18 DIAGNOSIS — R918 Other nonspecific abnormal finding of lung field: Secondary | ICD-10-CM | POA: Diagnosis not present

## 2023-10-18 DIAGNOSIS — J439 Emphysema, unspecified: Secondary | ICD-10-CM

## 2023-10-18 DIAGNOSIS — Z23 Encounter for immunization: Secondary | ICD-10-CM | POA: Diagnosis not present

## 2023-10-18 DIAGNOSIS — Z79899 Other long term (current) drug therapy: Secondary | ICD-10-CM

## 2023-10-18 DIAGNOSIS — F411 Generalized anxiety disorder: Secondary | ICD-10-CM

## 2023-10-18 DIAGNOSIS — F1091 Alcohol use, unspecified, in remission: Secondary | ICD-10-CM

## 2023-10-18 MED ORDER — BUSPIRONE HCL 10 MG PO TABS
10.0000 mg | ORAL_TABLET | Freq: Two times a day (BID) | ORAL | 3 refills | Status: DC
  Filled 2023-10-18 – 2023-12-16 (×3): qty 60, 30d supply, fill #0

## 2023-10-18 MED ORDER — COVID-19 MRNA VAC-TRIS(PFIZER) 30 MCG/0.3ML IM SUSY
0.3000 mL | PREFILLED_SYRINGE | Freq: Once | INTRAMUSCULAR | 0 refills | Status: AC
  Filled 2023-10-18 (×2): qty 0.3, 1d supply, fill #0

## 2023-10-18 MED ORDER — VARENICLINE TARTRATE (STARTER) 0.5 MG X 11 & 1 MG X 42 PO TBPK
ORAL_TABLET | ORAL | 0 refills | Status: AC
  Filled 2023-10-18 – 2023-12-17 (×3): qty 53, 28d supply, fill #0

## 2023-10-18 NOTE — Telephone Encounter (Signed)
Noted, FYI

## 2023-10-18 NOTE — Progress Notes (Signed)
 Patient ID: Brent Warren, male    DOB: [birth year]  MRN: [id number]  CC: Hypertension (HTN f/u. Med refills. /No questions / concerns/Yes to flu vax)   Subjective: Brent Warren is a 63 y.o. male who presents for chronic ds management. 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  History of Present Illness Brent Warren is a 63 year old male with hypertension and COPD who presents for follow-up of his chronic medical conditions.  HTN: He is taking metoprolol  50 mg twice daily for hypertension and adheres to his medication regimen. He does not monitor his blood pressure at home. No chest pain, shortness of breath, or leg swelling. He experiences occasional numbness in his toes, which he attributes to new shoes.  COPD/tob: he uses Symbicort  and Albuterol  inhalers occasionally, not daily, as a precaution. He has a significant smoking history of fifty years and currently smokes slightly more than half a pack a day. He did not receive the Chantix  prescription previously prescribed for smoking cessation stating that the pharmacy did not receive the rxn from me. He still desires to quit smoking.  In April, a CT scan revealed a new spot in the right upper lung, absent in previous scans. A follow-up CT scan was recommended in 3-6 mths and was ordered on last visit to be down in Sept but [place] was unable to reach him after 3 tries. He does not have a personal phone and uses his brother's cell phone for contact.  He has been abstinent from alcohol for nearly two years and has stopped using marijuana. He is taking Buspar  for anxiety, which helps to  calm him down, taken once in the morning and sometimes with his blood pressure medication in the afternoon.  He is taking meloxicam  for hand issues, which has improved his ability to open his hand, though he still experiences cramps and stiffness. He has received injections in the past for this condition.    Patient Active Problem List   Diagnosis Date Noted   Right hemiparesis (HCC) 02/18/2023   Aortic atherosclerosis 06/07/2022   Vitamin B12 deficiency 06/07/2022   Elevated MCV 11/20/2021   ICH (intracerebral hemorrhage) (HCC) 11/18/2021   COVID-19 vaccine series completed 10/26/2019   Polyarthritis 02/05/2019   Chest pain in adult 04/02/2017   Immunization due 11/06/2016   Pulmonary emphysema (HCC) 11/06/2016   Urge incontinence 11/06/2016   Tobacco abuse 11/06/2016   ETOH abuse 11/06/2016   Lung nodule, multiple 11/08/2015   HTN (hypertension) 08/01/2015   Hyponatremia 05/22/2015   Hepatitis 05/22/2015     Current Outpatient Medications on File Prior to Visit  Medication Sig Dispense Refill   albuterol  (PROVENTIL ) (2.5 MG/3ML) 0.083% nebulizer solution Take 3 mLs (2.5 mg total) by nebulization every 6 (six) hours as needed for wheezing or shortness of breath. (Patient not taking: Reported on 10/18/2023) 90 mL 2   albuterol  (VENTOLIN  HFA) 108 (90 Base) MCG/ACT inhaler Inhale 2 puffs into the lungs every 6 (six) hours as needed for wheezing or shortness of breath. 18 g 1   budesonide -formoterol  (SYMBICORT ) 80-4.5 MCG/ACT inhaler Inhale 2 puffs into the lungs 2 (two) times daily  10.2 g 10   cyanocobalamin  1000 MCG tablet Take 1 tablet (1,000 mcg total) by mouth daily. 130 tablet 1   folic acid  (FOLVITE ) 1 MG tablet Take 1 tablet (1 mg total) by mouth daily. 100 tablet 0   meloxicam  (MOBIC ) 7.5 MG tablet Take 1 tablet (7.5 mg total) by mouth daily as needed for pain. 90 tablet 1   metoprolol  tartrate (LOPRESSOR ) 50 MG tablet Take 1 tablet (50 mg total) by mouth 2 (two) times daily.  180 tablet 3   pantoprazole  (PROTONIX ) 40 MG tablet Take 1 tablet (40 mg total) by mouth daily.     No current facility-administered medications on file prior to visit.    No Known Allergies  Social History   Socioeconomic History   Marital status: Single    Spouse name: Not on file   Number of children: Not on file   Years of education: Not on file   Highest education level: Not on file  Occupational History   Not on file  Tobacco Use   Smoking status: Every Day    Current packs/day: 1.00    Average packs/day: 1 pack/day for 48.0 years (48.0 ttl pk-yrs)    Types: Cigarettes    Start date: 07/08/2015   Smokeless tobacco: Never   Tobacco comments:    a pack in a couple of days  Vaping Use   Vaping status: Never Used  Substance and Sexual Activity   Alcohol use: Yes    Alcohol/week: 42.0 standard drinks of alcohol    Types: 42 Cans of beer per week    Comment: daily   Drug use: No   Sexual activity: Not Currently  Other Topics Concern   Not on file  Social History Narrative   Not on file   Social Drivers of Health   Financial Resource Strain: Not on file  Food Insecurity: No Food Insecurity (11/18/2021)   Hunger Vital Sign    Worried About Running Out of Food in the Last Year: Never true    Ran Out of Food in the Last Year: Never true  Transportation Needs: No Transportation Needs (11/18/2021)   PRAPARE - Administrator, Civil Service (Medical): No    Lack of Transportation (Non-Medical): No  Physical Activity: Not on file  Stress: Not on file  Social Connections: Not on file  Intimate Partner Violence: Not At Risk (11/18/2021)   Humiliation, Afraid, Rape, and Kick questionnaire    Fear of Current or Ex-Partner: No    Emotionally Abused: No    Physically Abused: No    Sexually Abused: No    Family History  Problem Relation Age of Onset   Heart attack Mother    Cirrhosis Father    Stroke Sister    Heart attack Brother     Past Surgical  History:  Procedure Laterality Date   arm surgery     DECORTICATION Right 06/03/2015   Procedure: DECORTICATION;  Surgeon: Elspeth JAYSON Millers, MD;  Location: [facility] OR;  Service: Thoracic;  Laterality: Right;   VIDEO ASSISTED THORACOSCOPY (VATS)/EMPYEMA Right 06/03/2015   Procedure: VIDEO ASSISTED THORACOSCOPY (VATS)/EMPYEMA;  Surgeon: Elspeth JAYSON Millers, MD;  Location: [facility] OR;  Service: Thoracic;  Laterality: Right;    ROS: Review of Systems Negative except as stated above  PHYSICAL EXAM: BP 134/77   Pulse (!) 53   Temp 97.8 F (36.6 C) (Oral)   Ht 5' 9 (1.753 m)   Wt 130 lb (59 kg)   SpO2  100%   BMI 19.20 kg/m   Wt Readings from Last 3 Encounters:  10/18/23 130 lb (59 kg)  06/18/23 131 lb (59.4 kg)  03/29/23 133 lb 12.8 oz (60.7 kg)    Physical Exam   General appearance - alert, well appearing, and in no distress Mental status - normal mood, behavior, speech, dress, motor activity, and thought processes Neck - supple, no significant adenopathy Chest - clear to auscultation, no wheezes, rales or rhonchi, symmetric air entry Heart - normal rate, regular rhythm, normal S1, S2, no murmurs, rubs, clicks or gallops Extremities - no LE edema     Latest Ref Rng & Units 06/18/2023   12:25 PM 10/15/2022   11:57 AM 05/29/2022    1:29 PM  CMP  Glucose 70 - 99 mg/dL  92  875   BUN 8 - 27 mg/dL  11  11   Creatinine 9.23 - 1.27 mg/dL  8.99  9.07   Sodium 865 - 144 mmol/L  135  134   Potassium 3.5 - 5.2 mmol/L  5.2  3.8   Chloride 96 - 106 mmol/L  100  101   CO2 20 - 29 mmol/L  20  20   Calcium 8.6 - 10.2 mg/dL  9.8  9.4   Total Protein 6.0 - 8.5 g/dL 6.7  7.5  7.8   Total Bilirubin 0.0 - 1.2 mg/dL <9.7  0.3  0.8   Alkaline Phos 44 - 121 IU/L 106  115  80   AST 0 - 40 IU/L 15  20  24    ALT 0 - 44 IU/L 11  17  19     Lipid Panel     Component Value Date/Time   CHOL 131 06/18/2023 1225   TRIG 135 06/18/2023 1225   HDL 32 (L) 06/18/2023 1225   CHOLHDL 4.1 06/18/2023 1225    CHOLHDL 1.8 11/19/2021 0231   VLDL 10 11/19/2021 0231   LDLCALC 75 06/18/2023 1225    CBC    Component Value Date/Time   WBC 7.4 10/15/2022 1157   WBC 7.7 05/29/2022 1329   RBC 4.31 10/15/2022 1157   RBC 3.87 (L) 05/29/2022 1329   HGB 13.9 10/15/2022 1157   HCT 42.7 10/15/2022 1157   PLT 296 10/15/2022 1157   MCV 99 (H) 10/15/2022 1157   MCH 32.3 10/15/2022 1157   MCH 31.5 05/29/2022 1329   MCHC 32.6 10/15/2022 1157   MCHC 34.2 05/29/2022 1329   RDW 13.0 10/15/2022 1157   LYMPHSABS 2.5 05/29/2022 1329   MONOABS 0.6 05/29/2022 1329   EOSABS 0.1 05/29/2022 1329   BASOSABS 0.1 05/29/2022 1329    ASSESSMENT AND PLAN: 1. Essential hypertension (Primary) Close to goal.  Continue metoprolol  twice a day.  2. Tobacco abuse Strongly advised to quit.  He is still wanting to quit and would like to try the Chantix .  I will send the prescription to the pharmacy today so that he can stop downstairs and get it.  Advised to put down the cigarettes after he has been on the Chantix  for at least 2 weeks. - Varenicline  Tartrate, Starter, (CHANTIX  STARTING MONTH PAK) 0.5 MG X 11 & 1 MG X 42 TBPK; Take as directed.  Dispense: 53 each; Refill: 0  3. Lung nodule, multiple Strongly advised to quit smoking.  He is due for 13-month follow-up CAT scan for new nodule that was seen in the right upper lobe.  I had my medical assistant today call to schedule this so that he  has appointment in hand today before he leaves since he does not have a personal phone of his own  4. Pulmonary emphysema (HCC) So far he has done well not really requiring daily use of inhalers.  Again strongly advised to quit smoking.  5. GAD (generalized anxiety disorder) Stable on BuSpar . - busPIRone  (BUSPAR ) 10 MG tablet; Take 1 tablet (10 mg total) by mouth 2 (two) times daily. FOR ANXIETY  Dispense: 60 tablet; Refill: 3  6. Alcohol use disorder in remission Commended him on long-term remission.  Encouraged him to remain free of  alcohol.  7. Need for influenza vaccination Given today - Flu vaccine trivalent PF, 6mos and older(Flulaval,Afluria,Fluarix,Fluzone)  8. Need for COVID-19 vaccine Pt agreeable to receiving booster. Rxn sent to our pharmacy - COVID-19 mRNA vaccine, Pfizer, (COMIRNATY) syringe; Inject 0.3 mLs into the muscle once for 1 dose.  Dispense: 0.3 mL; Refill: 0  Patient was given the opportunity to ask questions.  Patient verbalized understanding of the plan and was able to repeat key elements of the plan.   This documentation was completed using Paediatric nurse.  Any transcriptional errors are unintentional.  Orders Placed This Encounter  Procedures   Flu vaccine trivalent PF, 6mos and older(Flulaval,Afluria,Fluarix,Fluzone)     Requested Prescriptions   Signed Prescriptions Disp Refills   busPIRone  (BUSPAR ) 10 MG tablet 60 tablet 3    Sig: Take 1 tablet (10 mg total) by mouth 2 (two) times daily. FOR ANXIETY   Varenicline  Tartrate, Starter, (CHANTIX  STARTING MONTH PAK) 0.5 MG X 11 & 1 MG X 42 TBPK 53 each 0    Sig: Take as directed.   COVID-19 mRNA vaccine, Pfizer, (COMIRNATY) syringe 0.3 mL 0    Sig: Inject 0.3 mLs into the muscle once for 1 dose.    Return in about 4 months (around 02/18/2024) for chronic ds management.  Barnie Louder, MD, FACP

## 2023-10-18 NOTE — Telephone Encounter (Signed)
 Copied from CRM [id number]. Topic: General - Running Late >> Oct 18, 2023 11:14 AM Charlet HERO wrote:  Patient/patient representative is calling because they are running late for an appointment.  Will be here before 11:20

## 2023-10-18 NOTE — Patient Instructions (Addendum)
  VISIT SUMMARY: Today, you came in for a follow-up visit to discuss your chronic medical conditions, including hypertension and COPD. We also reviewed your smoking status, a new lung nodule, and your hand issues. You are doing well with your current medications, and we discussed some new steps to help manage your health better.  YOUR PLAN: -PULMONARY NODULE, RIGHT UPPER LOBE: A new spot was found in your right upper lung in April, which could be a risk for cancer due to your smoking history. We need to schedule a follow-up CT scan to check on this nodule. Please make sure your contact information is up to date so we can arrange this scan.  -NICOTINE  DEPENDENCE, CURRENT SMOKER: You are currently smoking more than half a pack a day and want to quit. We will start you on a Chantix  starter pack to help you stop smoking. Please pick up the Chantix  from the pharmacy and start taking it. Aim to quit smoking after two weeks on Chantix . Also, discuss getting your COVID booster at the pharmacy.  -CHRONIC OBSTRUCTIVE PULMONARY DISEASE (COPD): COPD is a lung condition that makes it hard to breathe. You are managing well with occasional use of your inhaler. Continue using your inhaler as needed.  -HYPERTENSION: Hypertension is high blood pressure. Your blood pressure is slightly above the target at 134/77 mmHg. Continue taking metoprolol  50 mg twice daily and start monitoring your blood pressure at home.  -ANXIETY DISORDER: Anxiety disorder causes excessive worry and fear. You are feeling calmer with Buspar . Continue taking Buspar  as prescribed.  -ALCOHOL USE DISORDER, IN REMISSION: You have not consumed alcohol for nearly two years, which is excellent. Keep up the good work.  -GENERAL HEALTH MAINTENANCE: You are due for a flu shot and a COVID booster. We will give you the flu shot today, and you should get the COVID booster at the pharmacy.  INSTRUCTIONS: Please ensure your contact information is up to date  so we can schedule your follow-up CT scan. Pick up your Chantix  starter pack from the pharmacy and start taking it, aiming to quit smoking after two weeks. Continue taking your current medications as prescribed. Start monitoring your blood pressure at home. Get your COVID booster at the pharmacy. We will give you the flu shot today.                      Contains text generated by [employer].                                 Contains text generated by [employer]bridge.

## 2023-10-21 ENCOUNTER — Other Ambulatory Visit (HOSPITAL_COMMUNITY): Payer: Self-pay

## 2023-10-21 ENCOUNTER — Other Ambulatory Visit: Payer: Self-pay

## 2023-10-24 ENCOUNTER — Other Ambulatory Visit: Payer: MEDICAID

## 2023-10-29 ENCOUNTER — Other Ambulatory Visit: Payer: Self-pay

## 2023-10-30 ENCOUNTER — Other Ambulatory Visit: Payer: Self-pay

## 2023-10-30 ENCOUNTER — Encounter: Payer: Self-pay | Admitting: Internal Medicine

## 2023-11-26 ENCOUNTER — Other Ambulatory Visit: Payer: Self-pay

## 2023-12-04 ENCOUNTER — Other Ambulatory Visit: Payer: Self-pay

## 2023-12-06 ENCOUNTER — Other Ambulatory Visit: Payer: Self-pay

## 2023-12-16 ENCOUNTER — Other Ambulatory Visit: Payer: Self-pay

## 2023-12-16 ENCOUNTER — Other Ambulatory Visit: Payer: Self-pay | Admitting: Internal Medicine

## 2023-12-16 DIAGNOSIS — M720 Palmar fascial fibromatosis [Dupuytren]: Secondary | ICD-10-CM

## 2023-12-16 DIAGNOSIS — F1091 Alcohol use, unspecified, in remission: Secondary | ICD-10-CM

## 2023-12-16 MED ORDER — FOLIC ACID 1 MG PO TABS
1.0000 mg | ORAL_TABLET | Freq: Every day | ORAL | 0 refills | Status: AC
  Filled 2023-12-16: qty 100, 100d supply, fill #0

## 2023-12-16 MED ORDER — MELOXICAM 7.5 MG PO TABS
7.5000 mg | ORAL_TABLET | Freq: Every day | ORAL | 1 refills | Status: AC | PRN
  Filled 2023-12-16: qty 90, 90d supply, fill #0

## 2023-12-17 ENCOUNTER — Other Ambulatory Visit: Payer: Self-pay

## 2023-12-18 ENCOUNTER — Other Ambulatory Visit: Payer: Self-pay

## 2024-02-12 ENCOUNTER — Other Ambulatory Visit: Payer: Self-pay | Admitting: Internal Medicine

## 2024-02-12 ENCOUNTER — Other Ambulatory Visit: Payer: Self-pay

## 2024-02-12 DIAGNOSIS — J439 Emphysema, unspecified: Secondary | ICD-10-CM

## 2024-02-12 DIAGNOSIS — F411 Generalized anxiety disorder: Secondary | ICD-10-CM

## 2024-02-12 MED ORDER — BUSPIRONE HCL 10 MG PO TABS
10.0000 mg | ORAL_TABLET | Freq: Two times a day (BID) | ORAL | 0 refills | Status: AC
  Filled 2024-02-12: qty 60, 30d supply, fill #0

## 2024-02-18 ENCOUNTER — Ambulatory Visit: Payer: MEDICAID | Admitting: Internal Medicine
